# Patient Record
Sex: Male | Born: 1945 | Race: White | Hispanic: No | Marital: Married | Smoking: Former smoker
Health system: Southern US, Community
[De-identification: ages and names within clinical notes are randomized; demographics above are authoritative.]

## PROBLEM LIST (undated history)

## (undated) DIAGNOSIS — N183 Chronic kidney disease, stage 3 unspecified: Secondary | ICD-10-CM

## (undated) DIAGNOSIS — G4730 Sleep apnea, unspecified: Secondary | ICD-10-CM

## (undated) DIAGNOSIS — I2510 Atherosclerotic heart disease of native coronary artery without angina pectoris: Secondary | ICD-10-CM

## (undated) DIAGNOSIS — R51 Headache: Secondary | ICD-10-CM

## (undated) DIAGNOSIS — K209 Esophagitis, unspecified without bleeding: Secondary | ICD-10-CM

## (undated) DIAGNOSIS — K5792 Diverticulitis of intestine, part unspecified, without perforation or abscess without bleeding: Secondary | ICD-10-CM

## (undated) DIAGNOSIS — K449 Diaphragmatic hernia without obstruction or gangrene: Secondary | ICD-10-CM

## (undated) DIAGNOSIS — K2960 Other gastritis without bleeding: Secondary | ICD-10-CM

## (undated) DIAGNOSIS — M1990 Unspecified osteoarthritis, unspecified site: Secondary | ICD-10-CM

## (undated) DIAGNOSIS — N200 Calculus of kidney: Secondary | ICD-10-CM

## (undated) DIAGNOSIS — G8929 Other chronic pain: Secondary | ICD-10-CM

## (undated) DIAGNOSIS — K2950 Unspecified chronic gastritis without bleeding: Secondary | ICD-10-CM

## (undated) DIAGNOSIS — I7090 Unspecified atherosclerosis: Secondary | ICD-10-CM

## (undated) DIAGNOSIS — K4090 Unilateral inguinal hernia, without obstruction or gangrene, not specified as recurrent: Secondary | ICD-10-CM

## (undated) DIAGNOSIS — I10 Essential (primary) hypertension: Secondary | ICD-10-CM

## (undated) DIAGNOSIS — F419 Anxiety disorder, unspecified: Secondary | ICD-10-CM

## (undated) DIAGNOSIS — F319 Bipolar disorder, unspecified: Secondary | ICD-10-CM

## (undated) HISTORY — PX: APPENDECTOMY: SHX54

## (undated) HISTORY — DX: Unspecified osteoarthritis, unspecified site: M19.90

## (undated) HISTORY — DX: Anxiety disorder, unspecified: F41.9

## (undated) HISTORY — DX: Other chronic pain: G89.29

## (undated) HISTORY — DX: Headache: R51

## (undated) HISTORY — PX: PARTIAL COLECTOMY: SHX5273

---

## 1999-10-12 ENCOUNTER — Encounter: Payer: Self-pay | Admitting: Family Medicine

## 1999-10-12 ENCOUNTER — Encounter: Admission: RE | Admit: 1999-10-12 | Discharge: 1999-10-12 | Payer: Self-pay | Admitting: Family Medicine

## 2000-04-17 ENCOUNTER — Encounter: Payer: Self-pay | Admitting: Emergency Medicine

## 2000-04-17 ENCOUNTER — Emergency Department (HOSPITAL_COMMUNITY): Admission: EM | Admit: 2000-04-17 | Discharge: 2000-04-17 | Payer: Self-pay | Admitting: Emergency Medicine

## 2000-04-19 ENCOUNTER — Ambulatory Visit (HOSPITAL_COMMUNITY): Admission: EM | Admit: 2000-04-19 | Discharge: 2000-04-19 | Payer: Self-pay | Admitting: Internal Medicine

## 2000-04-19 ENCOUNTER — Encounter: Payer: Self-pay | Admitting: Urology

## 2001-04-16 ENCOUNTER — Encounter (INDEPENDENT_AMBULATORY_CARE_PROVIDER_SITE_OTHER): Payer: Self-pay

## 2001-04-17 ENCOUNTER — Encounter: Payer: Self-pay | Admitting: Emergency Medicine

## 2001-04-17 ENCOUNTER — Inpatient Hospital Stay (HOSPITAL_COMMUNITY): Admission: EM | Admit: 2001-04-17 | Discharge: 2001-05-04 | Payer: Self-pay | Admitting: Emergency Medicine

## 2001-04-20 ENCOUNTER — Encounter: Payer: Self-pay | Admitting: General Surgery

## 2001-07-20 ENCOUNTER — Encounter: Payer: Self-pay | Admitting: General Surgery

## 2001-07-20 ENCOUNTER — Ambulatory Visit (HOSPITAL_COMMUNITY): Admission: RE | Admit: 2001-07-20 | Discharge: 2001-07-20 | Payer: Self-pay | Admitting: General Surgery

## 2001-09-22 ENCOUNTER — Inpatient Hospital Stay (HOSPITAL_COMMUNITY): Admission: RE | Admit: 2001-09-22 | Discharge: 2001-09-28 | Payer: Self-pay | Admitting: General Surgery

## 2001-09-22 ENCOUNTER — Encounter (INDEPENDENT_AMBULATORY_CARE_PROVIDER_SITE_OTHER): Payer: Self-pay

## 2001-09-23 ENCOUNTER — Encounter: Payer: Self-pay | Admitting: General Surgery

## 2002-05-12 ENCOUNTER — Ambulatory Visit (HOSPITAL_COMMUNITY): Admission: RE | Admit: 2002-05-12 | Discharge: 2002-05-12 | Payer: Self-pay | Admitting: Gastroenterology

## 2003-08-10 ENCOUNTER — Encounter: Admission: RE | Admit: 2003-08-10 | Discharge: 2003-08-10 | Payer: Self-pay | Admitting: Gastroenterology

## 2003-08-10 ENCOUNTER — Encounter: Payer: Self-pay | Admitting: Gastroenterology

## 2005-02-06 ENCOUNTER — Encounter: Admission: RE | Admit: 2005-02-06 | Discharge: 2005-02-06 | Payer: Self-pay | Admitting: Nephrology

## 2006-11-10 ENCOUNTER — Ambulatory Visit: Payer: Self-pay | Admitting: Internal Medicine

## 2006-12-03 ENCOUNTER — Ambulatory Visit (HOSPITAL_BASED_OUTPATIENT_CLINIC_OR_DEPARTMENT_OTHER): Admission: RE | Admit: 2006-12-03 | Discharge: 2006-12-03 | Payer: Self-pay | Admitting: Internal Medicine

## 2006-12-18 ENCOUNTER — Ambulatory Visit: Payer: Self-pay | Admitting: Internal Medicine

## 2007-01-22 ENCOUNTER — Ambulatory Visit: Payer: Self-pay | Admitting: Internal Medicine

## 2007-04-07 ENCOUNTER — Ambulatory Visit: Payer: Self-pay | Admitting: Internal Medicine

## 2007-11-21 ENCOUNTER — Encounter: Payer: Self-pay | Admitting: Internal Medicine

## 2007-11-21 DIAGNOSIS — E785 Hyperlipidemia, unspecified: Secondary | ICD-10-CM | POA: Insufficient documentation

## 2007-11-21 DIAGNOSIS — I110 Hypertensive heart disease with heart failure: Secondary | ICD-10-CM

## 2007-11-21 DIAGNOSIS — G4733 Obstructive sleep apnea (adult) (pediatric): Secondary | ICD-10-CM

## 2007-11-24 ENCOUNTER — Encounter: Payer: Self-pay | Admitting: Internal Medicine

## 2009-04-21 ENCOUNTER — Encounter: Payer: Self-pay | Admitting: Cardiovascular Disease

## 2009-04-21 ENCOUNTER — Ambulatory Visit: Payer: Self-pay

## 2009-07-31 ENCOUNTER — Encounter: Admission: RE | Admit: 2009-07-31 | Discharge: 2009-07-31 | Payer: Self-pay | Admitting: Family Medicine

## 2009-09-12 ENCOUNTER — Encounter: Admission: RE | Admit: 2009-09-12 | Discharge: 2009-09-12 | Payer: Self-pay | Admitting: Orthopedic Surgery

## 2010-11-11 ENCOUNTER — Encounter: Payer: Self-pay | Admitting: Family Medicine

## 2010-11-11 ENCOUNTER — Encounter: Payer: Self-pay | Admitting: Nephrology

## 2011-03-05 NOTE — Assessment & Plan Note (Signed)
[facility]                             PULMONARY OFFICE NOTE   URBAN, NAVAL                        MRN:          [id number]  DATE:04/07/2007                            DOB:          [birth year]    PROBLEM:  Obstructive sleep apnea.   HISTORY:  Says that if he can keep CPAP on, he feels much better the  next day.  His snoring had driven him and wife into separate bedrooms so  she is not available to report on any breakthrough snoring.  His biggest  problem is that he takes it off in his sleep.  He does not remember  doing so.  He has been using Ambien and we discussed fugue states with  Ambien.   MEDICATIONS:  1. Ambien 10 mg.  2. Lamictal 200 mg.  3. Lotrel 5/10.  4. Advicor 500/20.  5. Aspirin 81 mg.  6. CPAP at 12 cwp.   OBJECTIVE:  VITAL SIGNS:  Weight 178 pounds, blood pressure 124/82,  pulse 81, room air saturation 98%.  HEENT:  There are no pressure marks on his face from the mask.  No nasal  congestion.  Breathing is unlabored.  Speech is clear.   IMPRESSION:  1. Obstructive sleep apnea.  2. His movements may be semi-automatic, complex enough in sleep to      take his CPAP mask off but not fitting the category of REM behavior      disorder or limb jerks.   PLAN:  1. We discussed sleep hygiene again.  2. He will continue CPAP at 12 cwp.  3. Try changing Ambien to available samples of Lunesta 3 mg one      nightly.  We are looking to see if we can relax him enough that he      is not fussing and pulling his mask off during the night.  4. Schedule return in four months, earlier p.r.n.     Clinton D. Maple Hudson, MD, Tonny Bollman, FACP  Electronically Signed    CDY/MedQ  DD: 04/07/2007  DT: 04/08/2007  Job #: [id number]   cc:   Talmadge Coventry, M.D.

## 2011-03-08 NOTE — Procedures (Signed)
[facility]  Patient:    Jordan [name], Jordan [name] Visit Number: [id number] MRN: [id number]          Service Type: END Location: ENDO Attending Physician:  Charna Elizabeth Dictated by:   Anselmo Rod, M.D. Proc. Date: 05/12/02 Admit Date:  05/12/2002 Discharge Date: 05/12/2002   CC:         Adolph Pollack, M.D.   Procedure Report  COLONOSCOPY REPORT  DATE OF BIRTH:  03-Apr-1946  PROCEDURE PERFORMED:  Screening colonoscopy.  ENDOSCOPIST:  Anselmo Rod, M.D.  INSTRUMENT USED:  Olympus videocolonoscope.  INDICATION FOR PROCEDURE:  A 65 year old white male undergoing a screening colonoscopy.  The patient had a recent change in bowel habits.  The patient has a previous history of acute sigmoid diverticulitis for which a sigmoid colectomy was done in June of 2002 by Dr. Abbey Chatters to rule out colonic polyps, masses, diverticulosis, etc.  PREPROCEDURAL PREPARATION:  Informed consent was procured from the patient. The patient fasted for eight hours prior to the procedure and prepped with a bottle of magnesium citrate and a gallon of Nu-Lytely the night prior to the procedure.  PREPROCEDURAL PHYSICAL:  VITAL SIGNS:  Stable.  NECK:  Supple.  CHEST:  Clear to auscultation, S1 and S2 regular.  ABDOMEN:  Soft with a well-healed surgical scar in the midline below the umbilicus and in the left lower quadrant from previous colostomy.  There is no evidence of hepatosplenomegaly.  No masses palpable.  DESCRIPTION OF PROCEDURE:  The patient was placed in the left lateral decubitus position and sedated with 66 mg of Demerol and 6 mg of Versed intravenously.  Once the patient was adequately sedated and maintained on low flow oxygen and continuous cardiac monitoring, the Olympus videocolonoscope was advanced from the rectum to the cecum without difficulty.  A healthy anastomosis was seen at 18 cm.  The entire colonic mucosa up to the  cecum appeared healthy with a normal vascular pattern, appendiceal orifice, and the ileocecal valve were clearly visualized.  Small internal and external hemorrhoids were seen in retroflexion and anal inspection respectively.  No diverticulosis, masses, or polyps were noted.  IMPRESSION: 1. Healthy-appearing colon with healthy anastomosis at 18 cm. 2. Small, nonbleeding internal and external hemorrhoids. 3. No diverticulosis, masses, or polyps.  RECOMMENDATIONS: 1. A high fiber diet has been recommended with liberal fluid intake. 2. Repeat colorectal cancer screening in the next 10 years unless the patient    develops any abnormal symptoms. 3. Outpatient followup in the next two weeks. Dictated by:   Anselmo Rod, M.D. Attending Physician:  Charna Elizabeth DD:  05/12/02 TD:  05/15/02 Job: [id number]

## 2011-03-08 NOTE — Discharge Summary (Signed)
[facility]  Patient:    Jordan [name], Jordan [name] Visit Number: [id number] MRN: [id number]          Service Type: Attending:  Adolph Pollack, M.D. Dictated by:   Adolph Pollack, M.D. Adm. Date:  09/22/01 Disc. Date: 09/28/01   CC:         Talmadge Coventry, M.D.   Discharge Summary  DISCHARGE DIAGNOSES: 1. End-colostomy. 2. Migraine headaches. 3. Depression.  PROCEDURES:  Laparoscopic-assisted colostomy closure and flexible sigmoidoscopy.  REASON FOR ADMISSION:  This 65 year old male underwent a sigmoid colectomy with colostomy for complicated diverticulitis with perforation and abscess. He has subsequently has recovered and was admitted for colostomy closure.  HOSPITAL COURSE:  He underwent the above procedure without complications. Postoperatively, he began having some headaches and ileus.  He had gaseous distention of his stomach which improved with some belching.  He continued to feel bloated; however, his bowel function began to return and he was started on a liquid diet.  By the sixth postoperative day, he was passing lots of gas, feeling much better.  Wounds were clean and intact and he was discharged.  DISPOSITION:  Discharge to home on a regular diet in satisfactory condition.  DISCHARGE MEDICATIONS:  He was given Tylox for pain and told to continue his home medications.  DISCHARGE INSTRUCTIONS:  He was given activity restrictions and will see me in the office in 10 days. Dictated by:   Adolph Pollack, M.D. Attending:  Adolph Pollack, M.D. DD:  10/08/01 TD:  10/10/01 Job: [id number]

## 2011-03-08 NOTE — Assessment & Plan Note (Signed)
[facility]                             PULMONARY OFFICE NOTE   LYNNE, RIGHI                        MRN:          [id number]  DATE:01/22/2007                            DOB:          07/06/1946    PROBLEM:  Obstructive sleep apnea.   HISTORY:  He says that he has been using CPAP most of the last two  nights set at 12 CWP. He complains that his mask leaks because of his  beard and often he has nasal congestion. We spent a considerable time  today discussing CPAP therapy and alternatives, mask comfort, and how to  work with his home care company.   MEDICATIONS:  1. Wellbutrin 300 mg.  2. Ambien 10 mg.  3. Lamictal 200 mg.  4. Lotrel 5/10.  5. Advicor 500/20.  6. Aspirin 81 mg.  7. Glucosamine 2000 mg.  8. CPAP at 12 CWP.   ALLERGIES:  No medication allergy.   OBJECTIVE:  Weight 178 pounds, blood pressure 138/74, pulse regular 75,  room air saturation 99%. There is a pink pressure mark across the bridge  of his nose. There is little or no nasal mucosa edema. He seems alert.  Breathing is unlabored.   IMPRESSION:  Obstructive sleep apnea very marginal so far on CPAP at 12  CWP.   PLAN:  1. Sample Veramyst 2 sprays each nostril daily.  2. He will work on mask fit with [facility].  3. Schedule return one month, earlier p.r.n.     Clinton D. Maple Hudson, MD, Tonny Bollman, FACP  Electronically Signed    CDY/MedQ  DD: 01/25/2007  DT: 01/26/2007  Job #: [id number]   cc:   Talmadge Coventry, M.D.

## 2011-03-08 NOTE — H&P (Signed)
[facility]  Patient:    MECCA, BARGA                        MRN: [id number] Adm. Date:  [id number] Disc. Date: [id number] Attending:  Shelba Flake                         History and Physical  EMERGENCY ROOM NOTE  This 65 year old white male was seen two days ago with left flank pain and he had an upper ureteral stone and a very small obstructing right ureteral stone. He started to feel much better when I saw him yesterday in consultation, but today he has had severe unrelenting left flank pain.  He presented to the emergency room and was given a dose of Toradol 60 mg I.M.  I discussed with him the options of continued observation versus intervention.  I got a KUB and now appears as if his stone is in the distal left ureter.  He is in general good health.  MEDICATIONS:  He is on no regular medications.  He is taking analgesics for his renal colic.  ALLERGIES:  He denied.  SOCIAL HISTORY:  He does not smoke or drink alcohol.  PHYSICAL EXAMINATION:  GENERAL:  He is a white male appearing stated age in obvious distress.  ABDOMEN:  Soft and nontender.  EXTERNAL GENITALIA:  Unremarkable.  PLAN:  The patient wants to go ahead with cystoscopic and ureteroscopic intervention of the stone and we will do so this afternoon. DD:  04/19/00 TD:  04/19/00 Job: [id number]

## 2011-03-08 NOTE — Assessment & Plan Note (Signed)
[facility]                             PULMONARY OFFICE NOTE   BRYTON, ROMAGNOLI                        MRN:          [id number]  DATE:11/10/2006                            DOB:          03/06/1946    PROBLEMS:  A 65 year old man referred in sleep medicine consultation at  the kind request of Dr. Smith Mince for obstructive sleep apnea.   HISTORY:  He says he was diagnosed with a sleep study at [facility] at least 20 years ago and told then he had sleep apnea.  I may  well have seen him, but he does not remember, and I do not.  There were  no records.  He was told then that he needed CPAP, but he never followed  up until as he says, My wife ran me out of the bedroom because of his  snoring.  Usual bedtime is between 11 and 12 p.m., estimating one-half  to one hour for sleep onset and waking at least 3-4 times during the  night, frequently for nocturia.  He is finally up in the morning at 7:00  a.m., feeling as tired as he went to bed.  He often comes home and falls  asleep after work.  He drives little.  He is aware that he tosses and  turns a lot in his sleep but is not aware of anymore complex behaviors,  kicking or jerking.   MEDICATIONS:  1. Wellbutrin 300 mg.  2. Ambien 10 mg.  3. Lamictal 200 mg.  4. Lotrel 5/10.  5. Advicor 500/20.  6. Aspirin 81 mg.  7. Glucosamine 2000 mg.   MEDICATION ALLERGIES:  None.   REVIEW OF SYSTEMS:  Frequent nasal congestion and mouth-breathing.  Weight has gone up a few pounds in the last year.  Occasional  indigestion, depression paroxysm, prostatism.   PAST HISTORY:  Hypertension.  Elevated cholesterol.  Chronic headaches.  Sleep apnea.  Colon surgery in 2002 for diverticulitis.  He denies any  history of ENT surgery, heart disease, or lung disease.  No problems  with thyroid are known.  He is treated for migraine and for tension  headache.   OBJECTIVE:  VITAL SIGNS:  Weight 180 pounds.  BP  122/82, pulse regular  at 77, room air saturation 98%.  GENERAL:  Medium body build with mild abdominal obesity, male pattern.  Rather restless-appearing, shifting of hands and feet.  He comes across  as tired but cooperative.  HEENT/NECK:  Palate length 3-4/4, rather posteriorly placed, thin soft  palate.  Mandible is normal.  There is no thyromegaly or stridor. Voice  quality is normal.  He is breathing comfortably through his nose without  obstruction.  LUNGS:  Clear.  HEART:  Heart sounds regular without murmur or gallop.   IMPRESSION:  Obstructive sleep apnea, needing update on status and  consideration of available therapies.   PLAN:  I have discussed sleep apnea associated medical concerns and  available treatments.  We are scheduling a split protocol nocturnal  polysomnography at the [facility] system center, and he will  return  after that for completion.  I have emphasized his responsibility to be  alert while driving or get somebody else to drive.     Clinton D. Maple Hudson, MD, Tonny Bollman, FACP  Electronically Signed    CDY/MedQ  DD: 11/10/2006  DT: 11/10/2006  Job #: [id number]   cc:   Talmadge Coventry, M.D.  [facility]

## 2011-03-08 NOTE — Discharge Summary (Signed)
[facility]  Patient:    Jordan [name], Jordan [name]                        MRN: [id number] Adm. Date:  [id number] Disc. Date: 05/04/01 Attending:  Arlis Porta CC:         Talmadge Coventry, M.D.   Discharge Summary  PRINCIPAL DISCHARGE DIAGNOSIS:  Sigmoid diverticulitis with perforation abscess.  SECONDARY DIAGNOSIS:  1. Postoperative bilious. 2. Hypertension. 3. Migraine headaches. 4. Mild depression. 5. Hypokalemia. 6. Malnutrition. 7. Thrombocytosis.  PROCEDURE:  Exploratory laparotomy, sigmoid colectomy, colostomy April 20, 2001.  REASON FOR ADMISSION:  This 65 year old male presented with back pain that radiated to the left lower quadrant and persisted.  It was associated with loose stool and fever.  He was seen in the emergency department and noticed to have a fever and mild tachycardia and left lower quadrant tenderness.  A CT scan demonstrated a left lower quadrant suprapubic inflammatory process.  His white blood cell count was 18,200 and he was admitted.  The patient was started on intravenous antibiotics and with serial exams.  He had a repeat CT scan showing more of a abscess type picture, but leakage of contrast into the abscess cavity.  On April 20, 2001, he was taken to the operating room for the above procedure.  He also has a central line inserted at that time.  Postoperatively he had a slow course.  He did have a prolonged illness.  He was placed on Unasyn and Flagyl.  He had hypokalemia and this was corrected.  Eventually he developed some hypertension and Dr. Smith Mince was called to see him.  She placed him on a Catapres patch and p.r.n. labetalol. He also had some migraine headaches which were treated with intermittent Toradol and Demerol.  His fluid cultures came back positive for E. coli which is not covered by Unasyn and his antibiotics were switched over to Zosyn which offered adequate coverage of the E. coli and he  remained on these for seven days.  He was somewhat depressed with his situation.  His wound was left open partially and there were packs placed in.  He slowly improved.  He was placed on total parenteral nutrition for malnutrition which slowly improved as his appetite improved.  His stoma began working and his wounds were healing well. By May 04, 2001, which is the 14 postoperative day, he was feeling much better, eating more, stoma was working well.  His TNA was stopped and he was felt to be satisfactory for discharge.  He was noted to have a thrombocytosis and was started on enteric-coated aspirin.  His discharge platelet count is 954,000.  DISPOSITION:  Discharged to home in satisfactory condition May 04, 2001.  He will be discharged on an enteric-coated aspirin one a day, Catapres-TTS two patch one every seven days, and darvocet for pain.  He was given activity and dietary instructions.  I have recommended that he see Dr. Smith Mince in one week to check his hypertension and recommend that he see me in two weeks for wound check.  Home health nursing has been arranged, this system with his wound care and his colostomy care. DD:  05/04/01 TD:  05/04/01 Job: [id number]

## 2011-03-08 NOTE — Op Note (Signed)
[facility]  Patient:    MALIKAH, LAKEY                        MRN: [id number] Proc. Date: 04/20/01 Adm. Date:  [id number] Attending:  Arlis Porta                           Operative Report  PREOPERATIVE DIAGNOSIS:  Sigmoid diverticulitis with abscess.  POSTOPERATIVE DIAGNOSIS:  Sigmoid diverticulitis with abscess.  PROCEDURE PERFORMED:  Sigmoid colectomy with colostomy and central line insertion.  SURGEON:  Adolph Pollack, M.D.  ASSISTANT:  Zigmund Daniel, M.D.  ANESTHESIA:  General.  INDICATIONS:  This 65 year old male was admitted 04/17/01 with sigmoid diverticulitis.  He has failed to improve medically, and underwent a CT scan today, which demonstrated an ill-defined abscess cavity with leakage of contrast into this.  Because he has complicated sigmoid diverticulitis not responding to medical management, he is brought to the operating room.  He also has poor peripheral venous access and thus the reason for central line placement.  DESCRIPTION OF PROCEDURE:  He was placed supine on the operating room and general anesthesia was administered.  His abdomen was sterilely prepped and draped.  A lower midline incision was made incising the skin up to the umbilicus sharply and then dividing the subcutaneous tissue and midline fascia with the cautery.  The peritoneal cavity was entered under direct vision and the peritoneum incised.  The bladder was retracted inferiorly.  Once in the peritoneal cavity, I noted the diseased segment of mid to proximal sigmoid colon.  The distal sigmoid colon was fairly normal appearing.  I was able to divide the sigmoid colon at its distal portion with the endo GIA stapler.  I then stayed close to the colon and divided the mesenteric vessels between clamps and ligated them.  I did not see the ureter, but believe it is well posterior to this plane.  The ureter remained clear throughout the case.  I noted  the abscess cavity laterally and drained this.  Once I was proximal to the disease, I divided the descending colon and sigmoid colon junction, and handed this specimen off the field.  I mobilized the ascending colon to allow for placement of the colostomy on the skin.  Once this was done, I went ahead and irrigated out the abdominal cavity and the pelvis area copiously.  I subsequently made a circular incision in the left mid-abdomen and there was full thickness.  I brought up the distal colon stump through this incision and anchored it to the anterior rectus sheath with interrupted Vicryl sutures.  There was one posterior anchoring suture as well. I then changed gloves.  After sponge and instrument counts were reported to be correct, I replaced the abdominal viscera and closed the fascia with running #1 PDS suture mixed with interrupted #1 Vicryl sutures.  The subcutaneous tissue was irrigated.  The skin was partly closed with staples and the rest was packed with moistened gauze.  I then covered the incision with a towel and matured the colostomy with interrupted 3-0 Vicryl sutures.  A stoma appliance was then placed.  A dry dressing was then placed over the incision followed by tape.  I next removed the gown and put on sterile gloves.  The right neck area was sterilely prepped and draped.  A size 22 needle was used to identify the right jugular vein, and  then a size 6.2 needle was placed into the right jugular vein.  A wire was passed through the needle.  Using the Seldinger technique, a 30 cm triple lumen catheter was inserted into the right jugular vein.  It was anchored to skin with interrupted silk sutures.  A sterile dressing was then applied.  I was able to withdraw blood and flush easily.  A portable chest x-ray is pending at this time.  He tolerated both procedures well with any apparent complications.  He was subsequently taken to the recovery room in satisfactory condition.   Of note was that I did anchor the distal rectosigmoid stump close to the site of the colostomy. DD:  04/20/01 TD:  04/21/01 Job: [id number]

## 2011-03-08 NOTE — Assessment & Plan Note (Signed)
[facility]                             PULMONARY OFFICE NOTE   HUTCH, RHETT                        MRN:          [id number]  DATE:12/18/2006                            DOB:          1945/11/04    PROBLEM:  Obstructive sleep apnea.   HISTORY:  Mr. Rajewski returns after sleep study on December 03, 2006 at  the [facility] which confirms severe obstructive apnea with an  index of 54.4 per hour, moderate snoring and oxygen desaturation to 69%.  CPAP titration was successful at 12 CWP for an index of 3.2 per hour.  Results were reviewed with him today.  He mentions he has a mild head  cold with nasal stuffiness which is apparent in conversation with him  but he says he is not usually like that.   MEDICATIONS:  Wellbutrin 300 mg, Ambien 10 mg, Lamictal 200 mg, Lotrel  5/10, Advicor 500/20, aspirin 81 mg, glucosamine.   ALLERGIES:  NO MEDICATION ALLERGY.   OBJECTIVE:  Weight 178 pounds, BP 122/78, pulse regular 81, room air  saturation 98%.  He is a little bit overweight, male pattern.  He looks  somewhat tired and is a little congested in the nose but not frankly  obstructed.   IMPRESSION:  Obstructive sleep apnea that should respond nicely to  continuous positive airway pressure.  Incidental viral upper respiratory  infection.   PLAN:  1. Home trial of CPAP at 12 CWP.  2. General treatment direction for sleep apnea was reviewed including      options and an emphasis on weight loss with his responsibility to      drive safely.  3. Schedule return 1 month, earlier p.r.n.     Clinton D. Maple Hudson, MD, Tonny Bollman, FACP  Electronically Signed    CDY/MedQ  DD: 12/20/2006  DT: 12/20/2006  Job #: [id number]   cc:   Talmadge Coventry, M.D.

## 2011-03-08 NOTE — Consult Note (Signed)
[facility]  Patient:    Jordan [name], Jordan [name]                        MRN: [id number] Proc. Date: 04/17/01 Adm. Date:  [id number] Attending:  Gwenlyn Saran Vonita Moss, M.D.   Consultation Report  CHIEF COMPLAINT:              Left lower quadrant pain.  HISTORY OF PRESENT ILLNESS:   [name] is a 65 year old male who noted some bilateral muscular-type back pain 48 hours ago. It started to get worse on the left side and also, it radiated around to his left lower quadrant and the suprapubic area was more severe, associated with some loose stool and fever. Pain was worse when he moved. He had decrease in appetite. No nausea or vomiting. He presented to the emergency department for evaluation. He has never had anything like this before.  PAST MEDICAL HISTORY: 1. Nephrolithiasis. 2. Migraine headaches. 3. Appendectomy.  MEDICATIONS:  Parafon Forte, Vicodin, Ambien.  SOCIAL HISTORY:  No tobacco or alcohol use.  REVIEW OF SYSTEMS:  Cardiovascular: No known heart disease or hypertension. Pulmonary: No asthma, pneumonia, tuberculosis. GI: No peptic ulcer disease, previous diverticulitis. No hepatitis or jaundice. He stated he had a colonoscopy 20 years ago. GU: No hematuria or dysuria. He states that when he does try to strain to urinate, he has more pain in that area. Endocrine: No diabetes or thyroid disease. Neurologic: No strokes or seizures. Hematologic: No blood disorders, transfusions, or deep venous thrombosis.  PHYSICAL EXAMINATION:  GENERAL:  An uncomfortable appearing male lying still on a stretcher.  VITAL SIGNS:  Temperature of 100.4, pulse 103, blood pressure 157/90, respiratory rate 22.  SKIN:  Warm and dry, no jaundice.  HEENT:  Eyes: Extraocular motions intact. No sclera, icterus present.  NECK:  No palpable masses.  NODES:  No palpable cervical, supraclavicular, axillary adenopathy.  CARDIOVASCULAR:  Heart  demonstrates increased rate with a regular rhythm and no murmur.  RESPIRATORY:  Breath sounds equal and clear. Respirations unlabored.  ABDOMEN:  Soft. There is a left lower quadrant tenderness and guarding to palpation and percussion. There is some suprapubic tenderness present as well.  RECTAL:  Normal tone without external lesions. His prostate is smooth without nodules. Mucousy stool is guaiac negative.  MUSCULOSKELETAL:  He has good range of motion of his extremities. No cyanosis or edema.  NEUROLOGIC:  He is alert and oriented and responds appropriately. He has a normal motor strength.  LABORATORY DATA:  Remarkable for normal amylase of 62. White blood cell count 18,200 with a left shift. Hemoglobin 13.8. Complete metabolic panel is all normal.  EKG demonstrates normal sinus rhythm.  CT scan review demonstrates left lower quadrant and suprapubic inflammatory process running the sigmoid colon. No free air noted. No free fluid noted.  IMPRESSION:  Acute sigmoid diverticulitis--no evidence of abscess or free perforation at this time.  PLAN:  Admission with bowel rest and IV antibiotics. I would plan to try to treat this with medical therapy. I explained to him that if he did not improve, that he may need an urgent operation which involved a sigmoid colectomy and colostomy. I also explained to him that if he improved over the next three or four days but then the situation worsened, we would repeat a CT scan, and if there was an abscess that eventually  formed, we would try percutaneous drainage. If he does improve medically, I suggested to him that he would need a colonoscopy approximately two months after this event. DD:  04/17/01 TD:  04/17/01 Job: [id number]

## 2011-03-08 NOTE — Procedures (Signed)
[facility]  Patient:    Jordan [name], Jordan [name]                        MRN: [id number] Proc. Date: 04/19/00 Adm. Date:  [id number] Disc. Date: [id number] Attending:  Shelba Flake                           Procedure Report  PREOPERATIVE DIAGNOSIS:  Left distal ureteral stone with severe pain.  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  PROCEDURE:  Cystoscopy, left ureteroscopic stone extraction with holmium laser, left retrograde pyelogram with interpretation and insertion of left double-J catheter.  ANESTHESIA:  General.  SURGEON:  Maretta Bees. Vonita Moss, M.D.  INDICATIONS:  This 65 year old gentleman has had a past history of stones and a couple of days ago, developed left flank pain and was found to have bilateral upper ureteral stones, with the left side obstructing and the right side not obstructing.  We were planning to follow him to see if he could pass the stone spontaneously and he developed severe left flank pain today and called me and came to the emergency room for evaluation.  KUB was done and it looks like the upper ureteral stone on the left is now down in the pelvic ureter and measures about 4 mm in size and the right upper ureteral stone, which is very tiny, is still present.  We discussed different options.  The patient wanted to go ahead with intervention at this time.  DESCRIPTION OF PROCEDURE:  The patient was brought to the operating room and placed in lithotomy position.  External genitalia were prepped and draped in usual fashion.  He was cystoscoped.  Anterior urethra was normal except for mild meatal stenosis.  Prostate was partially obstructing.  The bladder was unremarkable.  I passed a guidewire up the left ureter and under fluoroscopic control, went beyond the calcification felt to be the stone.  I then did a balloon dilation of the intramural ureter with the  4-cm 15-French balloon.  I dilated him for three minutes.  I then inserted the 6.4-French short rigid ureteroscope and just beyond the intramural ureter, I found another tight area and could not negotiate beyond this region.  I then reinserted the balloon dilator and dilated this area and after that, I was able to negotiate this tight spot.  I then visualized this brownish-golden irregular stone.  I thought it was too big to manipulate out without fragmentation, so I inserted the holmium laser probe and fragmented the stone into three pieces.  I then used the [facility] stone basket and removed the three pieces of fragmented stone and were given later to the patient.  I then injected contrast and there was a dilated upper ureter and dilated pyelocalyceal system.  With the guidewire in place, I re-backloaded the cystoscope and inserted a 6-French 26-cm double-J catheter with a full coil in the renal pelvis and full coil in the bladder.  I left the string off because I want to leave this catheter in for several days to allow healing of the distal ureter and possible strictured area.  He was then taken to the recovery room in good condition, having tolerated the procedure well.DD:  04/19/00 TD:  04/20/00 Job: [id number]

## 2011-03-08 NOTE — Op Note (Signed)
[facility]  Patient:    Jordan [name], Jordan [name] Visit Number: [id number] MRN: [id number]          Service Type: SUR Location: 3W 4782 01 Attending Physician:  Arlis Porta Dictated by:   Adolph Pollack, M.D. Proc. Date: 09/22/01 Admit Date:  09/22/2001                             Operative Report  PREOPERATIVE DIAGNOSIS:  Colostomy.  POSTOPERATIVE DIAGNOSIS:  Colostomy.  OPERATION PERFORMED:  Laparoscopically assisted colostomy closure.  Flexible sigmoidoscopy.  SURGEON:  Adolph Pollack, M.D.  ASSISTANT:  Lorne Skeens. Hoxworth, M.D.  ANESTHESIA:  General.  INDICATIONS FOR PROCEDURE:  This is a 65 year old male with complicated sigmoid diverticulitis with perforation abscess who underwent a sigmoid colectomy with colostomy.  He has recovered from that and now is admitted and now presents for elective colostomy closure.  DESCRIPTION OF PROCEDURE:  He was placed supine on the operating table and a general anesthetic was administered.  The abdominal wall was shaved.  I sterilely prepped to the stoma site and then inserted a Betadine soaked gauze sponge, covered this with a Tegaderm.  The abdomen was then sterilely prepped and draped.  Of note was that he was placed in the gentle lithotomy position. I made an incision in the right midabdominal wall incised the skin and subcutaneous tissues sharply.  I identified the most superficial layer of fascia and incised it sharply.  I placed a pursestring of 0 Vicryl around the fascia edges.  Using blunt dissection, I did muscle spreading and entering the peritoneal cavity.  A Hasson trocar was introduced into the peritoneal cavity and a pneumoperitoneum created by insufflation of CO2 gas.  Next a laparoscope was introduced.  There were multiple adhesions of the omentum and intestine to the anterior abdominal wall.  Under direct vision, a 10 mm trocar was placed in the right lower quadrant and  using sharp dissection with no cautery, I incised filmy adhesions allowing part of the small intestine and omentum to drop back into the peritoneal cavity in the epigastric region.  Once I had cleared this off, I placed an incision in the epigastric region and inserted a 10 mm trocar there.  I continued to work using only sharp dissection an no cautery.  I was able to gently divide the multiple adhesions between the anterior abdominal wall and the intestine and drop all of these down until I exposed the colostomy.  I did some dissection around the colostomy mobilizing the descending colon.  I subsequently identified the rectal stump which I had sewn to the peritoneum, just distal to the colostomy.  At this point I then directed my attention to the colostomy itself.  I made an elliptical incision around the colostomy and using the cautery I completely mobilized it and freed it from its attachments to the anterior abdominal wall.  I got very good length of this.  I subsequently put long marking sutures on this and placed it back into the peritoneal cavity.  I then was able to identify the distal segment and bring that stump up and mobilize it well up into the wound.  I then grasped the proximal segment.  I excised the colostomy using the stapler and sent this off for pathology.  I then placed the proximal segment and the distal segment side-to-side and performed a side-to-side staple anastomosis.  I closed the remaining enterotomy with  the linear noncutting stapler.  The anastomosis was patent, viable and under no tension.  I dropped the anastomosis back into the peritoneal cavity.  Next, gloves were changed.  I closed the abdominal wall defect where the colostomy was in two layers with running #1 PDS suture.  I irrigated each layer with antibiotic solution and closed the skin with staples.  next, a flexible sigmoidoscope was introduced through the rectum and we gently passed it up until  we saw the anastomosis.  Simultaneously, I reinstituted the pneumoperitoneum and watched from the inside not seeing any leakage.  The anastomosis appeared viable internally by way of the flexible sigmoidoscopy. Subsequently evacuated the air from the colon.  I did not notice any bleeding. I released the pneumoperitoneum and removed all the trocars.  The right midabdominal wall fascial defect was closed by tightening up and tying down the pursestring suture.  The remaining skin incisions from the trocar sites were closed with staples and dressings were applied.  The patient tolerated the procedure well without any apparently complications and he was taken to the recovery room in satisfactory condition. Dictated by:   Adolph Pollack, M.D. Attending Physician:  Arlis Porta DD:  09/22/01 TD:  09/22/01 Job: [id number]

## 2011-03-08 NOTE — Procedures (Signed)
NAME:  Jordan [name], Jordan [name]                 ACCOUNT NO.:  [id number]   MEDICAL RECORD NO.:  [id number]          PATIENT TYPE:  OUT   LOCATION:  SLEEP CENTER                 [facility]ACILITY[facility]  [facility]   PHYSICIAN:  Clinton D. Maple Hudson, MD, FCCP, FACPDATE OF BIRTH:  04[birth year]Sep[birth year]1944   DATE OF STUDY:  12/03/2006                            NOCTURNAL POLYSOMNOGRAM   REFERRING PHYSICIAN:  Clinton D. Young, MD, FCCP, FACP   INDICATION FOR STUDY:  Hypersomnia with sleep apnea.   EPWORTH SLEEPINESS SCORE:  6/24. BMI 28.2, weight 180 pounds.   MEDICATIONS:  Home medications were listed and reviewed.   SLEEP ARCHITECTURE:  Total sleep time 298 minutes with sleep efficiency  71%. Stage I was 9%, stage II 37%, stages III and IV absent. REM 4% of  total sleep time. Sleep latency 21 minutes. REM latency 276 minutes.  Awake after sleep onset 99 minutes, arousal index 33, indicating  increased sleep fragmentation. Ambien was taken at bedtime.   RESPIRATORY DATA:  Split study protocol. Apnea/hypopnea index (AHI, RDI)  54.4 obstructive events per hour indicating severe obstructive sleep  apnea/hypopnea syndrome before CPAP. This included 74 obstructive  apnea's, 1 mixed apnea and 42 hypopnea's before CPAP. Events were not  positional. REM AHI 0 per hour. CPAP was titrated to 12 CWP, AHI 3.2 per  hour. A medium Mirage Quattro mask was used with heated humidifier.   OXYGEN DATA:  Moderate snoring with oxygen desaturation to a nadir of  69%. Mean oxygen saturation after CPAP control was 92% on room air.   CARDIAC DATA:  Normal sinus rhythm.   MOVEMENT-PARASOMNIA:  Frequent limb jerks but few of these were  associated with arousal or awakening.   IMPRESSIONS-RECOMMENDATION:  1. Severe obstructive sleep apnea/hypopnea syndrome, AHI 54.4 per hour      with non positional events. Moderate snoring and oxygen      desaturation to a nadir of 69%.  2. Successful CPAP titration to 12 CWP, AHI 3.2 per hour. A medium      Mirage  Quattro mask was used with heated humidifier.      Clinton D. Maple Hudson, MD, [facility], FACP  Diplomate, Biomedical engineer of Sleep Medicine  Electronically Signed     CDY/MEDQ  D:  12/07/2006 12:14:14  T:  12/07/2006 78:29:56  Job:  [id number]

## 2011-03-08 NOTE — H&P (Signed)
[facility]  Patient:    Jordan [name], NISHI Visit Number: [id number] MRN: [id number]          Service Type: SUR Location: 3W 4782 01 Attending Physician:  Arlis Porta Dictated by:   Adolph Pollack, M.D. Admit Date:  09/22/2001   CC:         Talmadge Coventry, M.D.   History and Physical  REASON FOR ADMISSION:  Colostomy closure.  HISTORY OF PRESENT ILLNESS:  Jordan [name] is a 65 year old male who underwent a sigmoid colectomy with colostomy April 20, 2001, for complicated diverticulitis with perforation and abscess. He spent in the hospital two weeks postoperatively and then discharged to home. He has convalesced and improved, gotten his strength back, although he has been somewhat depressed at times. He had a barium enema which demonstrated the distal, colonic, and recto segment to be unremarkable. There is scattered diverticula throughout the proximal colon. He is now admitted for elective laparoscopically assisted colostomy closure.  PAST MEDICAL HISTORY: 1. Migraine headaches. 2. Depression. 3. Diverticulitis.  PAST SURGICAL HISTORY: 1. Appendectomy. 2. Exploratory laparotomy with sigmoid colectomy and colostomy.  ALLERGIES:  None known.  CURRENT MEDICATIONS:  Parafon Forte p.r.n. headaches, Vicodin p.r.n., Wellbutrin SR 150 mg b.i.d., Ambien 10 mg 1/2 tab q.h.s., aspirin 325 mg q.d.  SOCIAL HISTORY:  Single. No tobacco or alcohol use. His support system is his son and his girlfriend.  REVIEW OF SYSTEMS:  CARDIOVASCULAR:  He had hypertension when he was in the hospital before. However, he has been off medication, and his blood pressure has been relatively normal. No heart disease. PULMONARY:  No chronic lung disease. NEUROLOGICAL:  He does have known migraine headaches. PSYCHIATRIC: He does have some depression and was quite anxious coming into the operation. GU:  He has had kidney stones in the past.  PHYSICAL  EXAMINATION:  GENERAL:  A well-developed, well-nourished male in no acute distress, pleasant and cooperative.  VITAL SIGNS:  Temperature is 97.6, blood pressure 138/84, pulse 70, respiratory rate 12.  SKIN:  Warm and dry without rash.  HEENT:  Eyes:  Extraocular motions intact. No scleral icterus.  NECK:  Supple without palpable masses.  CARDIOVASCULAR:  Heart demonstrates regular rate and rhythm without a murmur.  RESPIRATORY:  Breath sounds equal and clear. Respirations unlabored.  ABDOMEN:  Soft, nontender. There is a well-healed midline and a pink stoma in the left lower quadrant.  EXTREMITIES:  No cyanosis or edema.  IMPRESSION:  End sigmoid colostomy admitted for laparoscopically assisted colostomy closure.  PLAN:  We will proceed with a laparoscopically assisted colostomy closure. The procedure and the risks including but not limited to bleeding, infection, anastomotic leak, and injury to extra-abdominal and intra-abdominal organs, such as ureter, bladder, intestines were explained to him. We also discussed the risks of general anesthesia. He understands and agrees to proceed. Dictated by:   Adolph Pollack, M.D. Attending Physician:  Arlis Porta DD:  09/22/01 TD:  09/22/01 Job: [id number]

## 2011-10-22 HISTORY — PX: SPINE SURGERY: SHX786

## 2011-12-01 ENCOUNTER — Emergency Department (HOSPITAL_COMMUNITY): Payer: Medicare Other

## 2011-12-01 ENCOUNTER — Encounter (HOSPITAL_COMMUNITY): Payer: Self-pay | Admitting: Emergency Medicine

## 2011-12-01 ENCOUNTER — Emergency Department (HOSPITAL_COMMUNITY)
Admission: EM | Admit: 2011-12-01 | Discharge: 2011-12-01 | Disposition: A | Discharge status: Home / Self Care | Payer: Medicare Other | Attending: Emergency Medicine | Admitting: Emergency Medicine

## 2011-12-01 ENCOUNTER — Other Ambulatory Visit: Payer: Self-pay

## 2011-12-01 DIAGNOSIS — M549 Dorsalgia, unspecified: Secondary | ICD-10-CM | POA: Insufficient documentation

## 2011-12-01 DIAGNOSIS — M62838 Other muscle spasm: Secondary | ICD-10-CM

## 2011-12-01 DIAGNOSIS — Z87442 Personal history of urinary calculi: Secondary | ICD-10-CM | POA: Insufficient documentation

## 2011-12-01 DIAGNOSIS — R112 Nausea with vomiting, unspecified: Secondary | ICD-10-CM | POA: Insufficient documentation

## 2011-12-01 DIAGNOSIS — Z7982 Long term (current) use of aspirin: Secondary | ICD-10-CM | POA: Insufficient documentation

## 2011-12-01 DIAGNOSIS — Z79899 Other long term (current) drug therapy: Secondary | ICD-10-CM | POA: Insufficient documentation

## 2011-12-01 DIAGNOSIS — M5380 Other specified dorsopathies, site unspecified: Secondary | ICD-10-CM | POA: Insufficient documentation

## 2011-12-01 DIAGNOSIS — N289 Disorder of kidney and ureter, unspecified: Secondary | ICD-10-CM | POA: Insufficient documentation

## 2011-12-01 DIAGNOSIS — I10 Essential (primary) hypertension: Secondary | ICD-10-CM | POA: Insufficient documentation

## 2011-12-01 HISTORY — DX: Essential (primary) hypertension: I10

## 2011-12-01 HISTORY — DX: Calculus of kidney: N20.0

## 2011-12-01 LAB — URINALYSIS, ROUTINE W REFLEX MICROSCOPIC
Bilirubin Urine: NEGATIVE
Glucose, UA: NEGATIVE mg/dL
Hgb urine dipstick: NEGATIVE
Ketones, ur: NEGATIVE mg/dL
Leukocytes, UA: NEGATIVE
Protein, ur: NEGATIVE mg/dL
Urobilinogen, UA: 0.2 mg/dL (ref 0.0–1.0)
pH: 7 (ref 5.0–8.0)

## 2011-12-01 LAB — CBC
Hemoglobin: 15.3 g/dL (ref 13.0–17.0)
MCHC: 35 g/dL (ref 30.0–36.0)
MCV: 88.3 fL (ref 78.0–100.0)
Platelets: 338 10*3/uL (ref 150–400)
RBC: 4.95 MIL/uL (ref 4.22–5.81)
RDW: 12.7 % (ref 11.5–15.5)

## 2011-12-01 LAB — BASIC METABOLIC PANEL
CO2: 20 mEq/L (ref 19–32)
Calcium: 10.4 mg/dL (ref 8.4–10.5)
GFR calc Af Amer: 49 mL/min — ABNORMAL LOW (ref >90)
GFR calc non Af Amer: 43 mL/min — ABNORMAL LOW (ref >90)
Glucose, Bld: 125 mg/dL — ABNORMAL HIGH (ref 70–99)
Potassium: 3.7 mEq/L (ref 3.5–5.1)
Sodium: 140 mEq/L (ref 135–145)

## 2011-12-01 LAB — DIFFERENTIAL
Basophils Relative: 0 % (ref 0–1)
Eosinophils Absolute: 0 10*3/uL (ref 0.0–0.7)
Eosinophils Relative: 0 % (ref 0–5)
Lymphs Abs: 1.3 10*3/uL (ref 0.7–4.0)
Monocytes Relative: 7 % (ref 3–12)

## 2011-12-01 MED ORDER — DIAZEPAM 5 MG PO TABS
5.0000 mg | ORAL_TABLET | Freq: Once | ORAL | Status: AC
  Administered 2011-12-01: 5 mg via ORAL
  Filled 2011-12-01: qty 1

## 2011-12-01 MED ORDER — DIAZEPAM 5 MG PO TABS
5.0000 mg | ORAL_TABLET | Freq: Once | ORAL | Status: DC
  Filled 2011-12-01: qty 1

## 2011-12-01 MED ORDER — DIAZEPAM 5 MG PO TABS: ORAL_TABLET | ORAL | Status: AC

## 2011-12-01 MED ORDER — SODIUM CHLORIDE 0.9 % IV BOLUS (SEPSIS)
500.0000 mL | Freq: Once | INTRAVENOUS | Status: AC
  Administered 2011-12-01: 500 mL via INTRAVENOUS

## 2011-12-01 MED ORDER — HYDROMORPHONE HCL PF 1 MG/ML IJ SOLN
1.0000 mg | Freq: Once | INTRAMUSCULAR | Status: AC
  Administered 2011-12-01: 1 mg via INTRAVENOUS
  Filled 2011-12-01: qty 1

## 2011-12-01 MED ORDER — ONDANSETRON HCL 4 MG/2ML IJ SOLN
4.0000 mg | Freq: Once | INTRAMUSCULAR | Status: AC
  Administered 2011-12-01: 4 mg via INTRAVENOUS
  Filled 2011-12-01: qty 2

## 2011-12-01 MED ORDER — KETOROLAC TROMETHAMINE 30 MG/ML IJ SOLN
30.0000 mg | Freq: Once | INTRAMUSCULAR | Status: DC
  Filled 2011-12-01: qty 1

## 2011-12-01 MED ORDER — SODIUM CHLORIDE 0.9 % IV BOLUS (SEPSIS): 1000.0000 mL | Freq: Once | INTRAVENOUS | Status: DC

## 2011-12-01 MED ORDER — HYDROCODONE-ACETAMINOPHEN 5-500 MG PO TABS: 1.0000 | ORAL_TABLET | Freq: Four times a day (QID) | ORAL | Status: AC | PRN

## 2011-12-01 NOTE — ED Notes (Signed)
C/o R flank pain and vomiting x 35 minutes.  Denies dysuria.

## 2011-12-01 NOTE — ED Notes (Signed)
Patient transported to X-ray 

## 2011-12-01 NOTE — ED Provider Notes (Signed)
History     CSN: [id number]  Arrival date & time 12/01/11  9528   First MD Initiated Contact with Patient 12/01/11 1854      Chief Complaint  Patient presents with  . Flank Pain    (Consider location/radiation/quality/duration/timing/severity/associated sxs/prior treatment) HPI  Patient presents to emergency department complaining of acute onset right lateral scapular/flank pain that began approximately 45 minutes prior to arrival. Patient states that he was very busy about the house today doing odd jobs but nothing of great exertion and then he sat down to have supper and felt acute onset pain in his scapular region that has been persistent and unchanging since onset. Patient denies radiation of pain. Patient states became mildly nauseous and vomited once prior to arrival. States pain is aggravated by movement. Denies alleviating factors. Denies CP, SOB, abdominal pain, diarrhea, cough. Took BC powder PTA without relief of pain.   Per patient, he has baseline renal insufficiency that is being followed by PCP and renal specialist and has been on prednisone for three days because of cervical pain by his orthopedic specialist.   Past Medical History  Diagnosis Date  . Hypertension   . Neck pain   . Kidney stone     History reviewed. No pertinent past surgical history.  No family history on file.  History  Substance Use Topics  . Smoking status: Never Smoker   . Smokeless tobacco: Not on file  . Alcohol Use: No      Review of Systems  All other systems reviewed and are negative.    Allergies  Review of patient's allergies indicates no known allergies.  Home Medications   Current Outpatient Rx  Name Route Sig Dispense Refill  . ACETAMINOPHEN-CODEINE #3 300-30 MG PO TABS Oral Take 1 tablet by mouth every 8 (eight) hours as needed. For pain.    Marland Kitchen ALLOPURINOL 100 MG PO TABS Oral Take 100 mg by mouth daily.    . ASPIRIN EC 81 MG PO TBEC Oral Take 81 mg by mouth daily.     . BUPROPION HCL ER (SR) 150 MG PO TB12 Oral Take 450 mg by mouth daily.    Marland Kitchen FLUOXETINE HCL 20 MG PO CAPS Oral Take 20 mg by mouth daily.    Marland Kitchen GARLIC OIL 1500 MG PO CAPS Oral Take 1 capsule by mouth daily.    Marland Kitchen GLUCOSAMINE HCL 1500 MG PO TABS Oral Take 1 tablet by mouth daily.    Marland Kitchen LAMOTRIGINE 200 MG PO TABS Oral Take 200 mg by mouth daily.    Marland Kitchen LISINOPRIL 20 MG PO TABS Oral Take 20 mg by mouth daily.    Marland Kitchen METHOCARBAMOL 500 MG PO TABS Oral Take 500 mg by mouth every 8 (eight) hours as needed. For muscle spasms.    Marland Kitchen ZOLPIDEM TARTRATE 10 MG PO TABS Oral Take 10 mg by mouth at bedtime as needed. For sleep.      BP 150/86  Pulse 73  Temp(Src) 97.5 F (36.4 C) (Oral)  Resp 18  SpO2 100%  Physical Exam  Nursing note and vitals reviewed. Constitutional: He is oriented to person, place, and time. He appears well-developed and well-nourished. No distress.  HENT:  Head: Normocephalic and atraumatic.  Eyes: Conjunctivae are normal.  Neck: Normal range of motion. Neck supple.  Cardiovascular: Normal rate, regular rhythm, normal heart sounds and intact distal pulses.  Exam reveals no gallop and no friction rub.   No murmur heard. Pulmonary/Chest: Effort normal and breath sounds normal.  No respiratory distress. He has no wheezes. He has no rales. He exhibits no tenderness.  Abdominal: Bowel sounds are normal. He exhibits no distension and no mass. There is no tenderness. There is no rebound and no guarding.  Musculoskeletal: Normal range of motion. He exhibits tenderness. He exhibits no edema.       Point specific tenderness to palpation in right scapular region at a trigger point with muscle spasticity but no crepitus or skin changes. Pain with range of motion his shoulder at point specific trigger point. Remainder of shoulder nontender. No midline final tenderness to palpation.  Neurological: He is alert and oriented to person, place, and time.  Skin: Skin is warm and dry. No rash noted. He is  not diaphoretic. No erythema.  Psychiatric: He has a normal mood and affect.    ED Course  Procedures (including critical care time)  IV dilaudid and PO valium  Patient states pain has resolved and is lying comfortably in bed without complaint.   Labs Reviewed  CBC - Abnormal; Notable for the following:    WBC 10.9 (*)    All other components within normal limits  DIFFERENTIAL - Abnormal; Notable for the following:    Neutrophils Relative 81 (*)    Neutro Abs 8.9 (*)    All other components within normal limits  BASIC METABOLIC PANEL - Abnormal; Notable for the following:    Glucose, Bld 125 (*)    BUN 28 (*)    Creatinine, Ser 1.61 (*)    GFR calc non Af Amer 43 (*)    GFR calc Af Amer 49 (*)    All other components within normal limits  URINALYSIS, ROUTINE W REFLEX MICROSCOPIC  TROPONIN I  D-DIMER, QUANTITATIVE   Ct Abdomen Pelvis Wo Contrast  12/01/2011  *RADIOLOGY REPORT*  Clinical Data: Right-sided flank pain and scapular pain.  History of kidney stones.  CT ABDOMEN AND PELVIS WITHOUT CONTRAST  Technique:  Multidetector CT imaging of the abdomen and pelvis was performed following the standard protocol without intravenous contrast.  Comparison: Renal ultrasound 02/06/2005.  Findings: The lung bases are clear without focal nodule, mass, or airspace disease.  Heart size is normal.  Coronary artery calcifications are evident.  The  The liver and spleen are within normal limits.  A small hiatal hernia is present.  The stomach, duodenum, and pancreas are otherwise within normal limits.  The common bile duct and gallbladder are unremarkable.  The adrenal glands are normal bilaterally.  There are at least two punctate nonobstructive stones within the right kidney.  Three or four punctate nonobstructive stones are evident in the left kidney.  The ureters are within normal limits bilaterally.  There is no obstruction.  The urinary bladder is unremarkable.  The patient is status post partial  colectomy.  The colon is otherwise intact.  The appendix is not discretely visualized and may be surgically absent.  There are no secondary signs of infection.  The small bowel is unremarkable.  Bone windows demonstrate to rightward curvature of the lumbar spine.  Right lateral listhesis is present at to L4-5 with chronic end plate changes.  Chronic end plate changes are present at L5-S1 as well.  Multilevel facet degenerative changes are evident.  IMPRESSION:  1.  Bilateral nonobstructing punctate nephrolithiasis. 2.  No evidence for hydronephrosis. 3.  The atherosclerosis, including coronary artery disease. 4.  Scoliosis as described.  Original Report Authenticated By: Jamesetta Orleans. MATTERN, M.D.   Dg Chest 2 View  12/01/2011  *RADIOLOGY REPORT*  Clinical Data: Chest pain.  CHEST - 2 VIEW  Comparison: None  Findings: The cardiac silhouette, mediastinal and hilar contours are within normal limits.  The lungs are clear.  Mild eventration of the left hemidiaphragm is noted.  No pleural effusion.  The bony thorax is intact.  IMPRESSION: No acute cardiopulmonary findings.  Original Report Authenticated By: P. Loralie Champagne, M.D.    Date: 12/01/2011  Rate: 83  Rhythm: normal sinus rhythm  QRS Axis: normal  Intervals: normal  ST/T Wave abnormalities: normal  Conduction Disutrbances: none  Narrative Interpretation:   Old EKG Reviewed: Sep 23, 2001, No significant changes noted     1. Muscle spasm   2. Back pain   3. Renal insufficiency       MDM  Patient had point specific pain in right scapular region with muscle spasticity at a trigger point. Chest x-ray and CT of abdomen and pelvis showed no acute findings. Patient has baseline mild renal insufficiency and has been on prednisone x3 days which would account for mild elevation in white blood cell count. Patient states his pain is resolved with resolution of pain his blood pressure has decreased. Patient states he has established a close  relationship with primary care physician, renal specialist, in orthopedic specialist but he will followup closely right scapular pain but that he'll return to emergency department for emergent changing or worsening symptoms. Throughout stay patient denied pain in his chest or shortness of breath. D-dimer and troponin are negative and non provocative EKG  Likely origin of pain being MSK and muscle spasm.         Jenness Corner, [place] 12/01/11 2219

## 2011-12-01 NOTE — ED Notes (Signed)
Patient is AOx4 and comfortable with his discharge instructions. 

## 2011-12-02 NOTE — ED Provider Notes (Signed)
Medical screening examination/treatment/procedure(s) were conducted as a shared visit with non-physician practitioner(s) and myself.  I personally evaluated the patient during the encounter   66yo M, c/o right sided lower scapular/flank pain that began approx PTA.  Pt states he had been busy working all day (without discomfort) and when rested and started to eat a meal felt the pain.  Describes as "sharp" worse with movement of torso and palpation of the area.  +TTP right lower thoracic paraspinal muscles.  Work up neg for acute process.  Improved with symptomatic treatment.  Pt wants to go home and will f/u with his PMD.    Laray Anger, DO 12/02/11 1809

## 2012-01-03 ENCOUNTER — Other Ambulatory Visit: Payer: Self-pay | Admitting: Neurosurgery

## 2012-01-03 DIAGNOSIS — M549 Dorsalgia, unspecified: Secondary | ICD-10-CM

## 2012-01-09 ENCOUNTER — Ambulatory Visit
Admission: RE | Admit: 2012-01-09 | Discharge: 2012-01-09 | Disposition: A | Discharge status: Home / Self Care | Payer: Medicare Other | Source: Ambulatory Visit | Attending: Neurosurgery | Admitting: Neurosurgery

## 2012-01-09 DIAGNOSIS — M549 Dorsalgia, unspecified: Secondary | ICD-10-CM

## 2012-01-14 ENCOUNTER — Encounter (HOSPITAL_COMMUNITY): Payer: Self-pay | Admitting: Pharmacy Technician

## 2012-01-16 ENCOUNTER — Encounter (HOSPITAL_COMMUNITY): Payer: Self-pay | Admitting: *Deleted

## 2012-01-16 ENCOUNTER — Observation Stay (HOSPITAL_COMMUNITY)
Admission: EM | Admit: 2012-01-16 | Discharge: 2012-01-17 | Disposition: A | Discharge status: Home / Self Care | Payer: Medicare Other | Attending: Emergency Medicine | Admitting: Emergency Medicine

## 2012-01-16 ENCOUNTER — Encounter (HOSPITAL_COMMUNITY): Payer: Self-pay

## 2012-01-16 ENCOUNTER — Encounter (HOSPITAL_COMMUNITY)
Admission: RE | Admit: 2012-01-16 | Discharge: 2012-01-16 | Disposition: A | Discharge status: Home / Self Care | Payer: Medicare Other | Source: Ambulatory Visit | Attending: Neurosurgery | Admitting: Neurosurgery

## 2012-01-16 DIAGNOSIS — M549 Dorsalgia, unspecified: Secondary | ICD-10-CM

## 2012-01-16 DIAGNOSIS — N289 Disorder of kidney and ureter, unspecified: Secondary | ICD-10-CM | POA: Insufficient documentation

## 2012-01-16 DIAGNOSIS — D649 Anemia, unspecified: Principal | ICD-10-CM | POA: Insufficient documentation

## 2012-01-16 DIAGNOSIS — M5126 Other intervertebral disc displacement, lumbar region: Secondary | ICD-10-CM | POA: Insufficient documentation

## 2012-01-16 DIAGNOSIS — I10 Essential (primary) hypertension: Secondary | ICD-10-CM | POA: Insufficient documentation

## 2012-01-16 HISTORY — DX: Bipolar disorder, unspecified: F31.9

## 2012-01-16 HISTORY — DX: Diverticulitis of intestine, part unspecified, without perforation or abscess without bleeding: K57.92

## 2012-01-16 LAB — SURGICAL PCR SCREEN
MRSA, PCR: NEGATIVE
Staphylococcus aureus: NEGATIVE

## 2012-01-16 LAB — BASIC METABOLIC PANEL
BUN: 22 mg/dL (ref 6–23)
Creatinine, Ser: 1.79 mg/dL — ABNORMAL HIGH (ref 0.50–1.35)
GFR calc non Af Amer: 38 mL/min — ABNORMAL LOW (ref >90)
Glucose, Bld: 85 mg/dL (ref 70–99)
Potassium: 3.8 mEq/L (ref 3.5–5.1)

## 2012-01-16 LAB — DIFFERENTIAL
Basophils Absolute: 0.1 10*3/uL (ref 0.0–0.1)
Basophils Relative: 1 % (ref 0–1)
Eosinophils Absolute: 0.4 10*3/uL (ref 0.0–0.7)
Neutro Abs: 3.4 10*3/uL (ref 1.7–7.7)
Neutrophils Relative %: 56 % (ref 43–77)

## 2012-01-16 LAB — CBC
MCH: 25.9 pg — ABNORMAL LOW (ref 26.0–34.0)
MCHC: 31 g/dL (ref 30.0–36.0)
Platelets: 380 10*3/uL (ref 150–400)

## 2012-01-16 NOTE — Pre-Procedure Instructions (Signed)
[address] [facility]  01/16/2012   Your procedure is scheduled on:  January 24, 2012  Report to [facility] at 0715 AM.  Call this number if you have problems the morning of surgery: [phone]   Remember:   Do not eat food:After Midnight.  May have clear liquids: up to 4 Hours before arrival.  Clear liquids include soda, tea, black coffee, apple or grape juice, broth.  Take these medicines the morning of surgery with A SIP OF WATER:Prozac, Lamicatal   STOP Garlic Oil, Glucosamine, Fish Oil, Aspirin today  Do not wear jewelry, make-up or nail polish.  Do not wear lotions, powders, or perfumes. You may wear deodorant.  Do not shave 48 hours prior to surgery.  Do not bring valuables to the hospital.  Contacts, dentures or bridgework may not be worn into surgery.  Leave suitcase in the car. After surgery it may be brought to your room.  For patients admitted to the hospital, checkout time is 11:00 AM the day of discharge.   Patients discharged the day of surgery will not be allowed to drive home.  Name and phone number of your driver: [facility]  Special Instructions: CHG Shower Use Special Wash: 1/2 bottle night before surgery and 1/2 bottle morning of surgery.   Please read over the following fact sheets that you were given: Pain Booklet, Coughing and Deep Breathing and Surgical Site Infection Prevention

## 2012-01-16 NOTE — Progress Notes (Signed)
Revonda Standard, PA notified of Hgb and Hct.. Pt notified and denies dizziness, shortness of breath with exertion, gross blood in stools, or black or tarry stools. Revonda Standard, [place] notified Dr. Paulino Rily office patients primary care office. Dr. Lindalou Hose office notified.

## 2012-01-16 NOTE — ED Provider Notes (Signed)
11:38 PM Patient has arrived to CDU for holding to continue blood transfusion.  Sign out received from [employer], PA-C. Patient has upcoming surgery scheduled, preop labs showed anemia, patient sent for blood transfusion.  Patient to be discharged home following transfusion.  Patient reports he is feeling fine, no complaints and no needs at this time.    11:42 PM Jordan Bacca, PA-C, to sign patient out to Dr Nicanor Alcon at change of shift. Dr Nicanor Alcon to set disposition upon completion of blood transfusion.    Jordan [name], [place] 01/16/12 [phone]

## 2012-01-16 NOTE — Consult Note (Addendum)
Anesthesia:  Patient is a 66 year old male scheduled for left T6-7 transpedicular microdiskectomy on 01/24/12.  His PAT appointment was earlier today.  I did not personally evaluate him, but was asked to review his labs after 1600 today.  His history includes former smoker, kidney stones, diverticulitis s/p partial colectomy, HTN, neck pain, Bipolar disorder type I.  By current and previous labs, he appears to have renal insufficiency as well.  CXR on 12/01/11 showed no acute cardiopulmonary findings.  EKG on 12/01/11 showed NSR, prolonged QT.  Labs showed a Cr of 1.79 (up from 1.61 on 12/01/11).  WBC 6.0.  H/H down to 7.8/25.2 (was 15.3/43.7 on 12/01/11).  PLT 380.  His PAT nurse reported that Jordan [name] appeared pale today with complaints of a headache.  He also had back pain, which I believe is more chronic.  He denied obvious melena or hematochezia.  He just mailed hemoccult sample to his PCP Dr. Mila Palmer yesterday, but these were routine.  I called his H/H result to Dr. Lindalou Hose office and faxed the results as requested.  I also spoke with Andrey Campanile a nurse at Dr. Ali Lowe office.  She spoke with Jordan [name] and Dr. Paulino Rily and a decision was made to have him go the the ED today for further evaluation and possible transfusion and admission for work-up of acute/subacute anemia.    Addendum: 01/17/12 1250  Jordan [name] received two units of PRBC is the ED and was discharged home earlier today.  His Hgb was up to 8.8.  He was asymptomatic and no source of active bleeding was identified (hemoccult was negative in the ED).  Neurosurgery was updated by the ED physician with plans to recheck his Hgb preoperatively and proceed if stable.  Dr. Jordan Likes has requested that a T&C 2 Units PRBC be ordered preoperatively (done).  I have also ordered a repeat BMET to evaluate his renal function post-transfusion as well as coags.  Anesthesiologist Dr. Katrinka Blazing updated on patient's anemia and recent transfusion.

## 2012-01-16 NOTE — ED Notes (Signed)
Pt had blood work done this am and was told to come to ED for low hemoglobin. Pt with no hx of the same. Denies abdominal pain. Reports T5-T6 chronic back pain. Denies dark stool.

## 2012-01-16 NOTE — ED Notes (Signed)
Report given to CDU nurse

## 2012-01-16 NOTE — ED Notes (Signed)
Patient here for blood transfusion for surgery.  To have back surgery next Friday on his back.  Skin pale cool to touch

## 2012-01-16 NOTE — ED Notes (Signed)
Home [phone]  Cell [phone]

## 2012-01-17 LAB — DIFFERENTIAL
Basophils Relative: 2 % — ABNORMAL HIGH (ref 0–1)
Eosinophils Absolute: 0.4 10*3/uL (ref 0.0–0.7)
Eosinophils Relative: 7 % — ABNORMAL HIGH (ref 0–5)
Lymphs Abs: 2 10*3/uL (ref 0.7–4.0)
Monocytes Absolute: 0.6 10*3/uL (ref 0.1–1.0)
Monocytes Relative: 12 % (ref 3–12)

## 2012-01-17 LAB — CBC
HCT: 27.7 % — ABNORMAL LOW (ref 39.0–52.0)
Hemoglobin: 8.8 g/dL — ABNORMAL LOW (ref 13.0–17.0)
MCH: 26 pg (ref 26.0–34.0)
MCHC: 31.8 g/dL (ref 30.0–36.0)
MCV: 81.7 fL (ref 78.0–100.0)
RBC: 3.39 MIL/uL — ABNORMAL LOW (ref 4.22–5.81)

## 2012-01-17 LAB — FERRITIN: Ferritin: 8 ng/mL — ABNORMAL LOW (ref 22–322)

## 2012-01-17 LAB — FOLATE: Folate: 13.1 ng/mL

## 2012-01-17 LAB — IRON AND TIBC
Iron: <10 ug/dL — ABNORMAL LOW (ref 42–135)
UIBC: 456 ug/dL — ABNORMAL HIGH (ref 125–400)

## 2012-01-17 MED ORDER — ACETAMINOPHEN 325 MG PO TABS
650.0000 mg | ORAL_TABLET | Freq: Once | ORAL | Status: AC
  Administered 2012-01-17: 650 mg via ORAL
  Filled 2012-01-17: qty 2

## 2012-01-17 MED ORDER — HYDROCODONE-ACETAMINOPHEN 5-325 MG PO TABS
1.0000 | ORAL_TABLET | Freq: Once | ORAL | Status: AC
  Administered 2012-01-17: 1 via ORAL
  Filled 2012-01-17: qty 1

## 2012-01-17 MED ORDER — HYDROCODONE-ACETAMINOPHEN 5-325 MG PO TABS
2.0000 | ORAL_TABLET | Freq: Once | ORAL | Status: AC
  Administered 2012-01-17: 2 via ORAL
  Filled 2012-01-17: qty 2

## 2012-01-17 NOTE — ED Notes (Signed)
Pt d/c'd from IV, continuous pulse oximetry and blood pressure cuff; pt getting dressed to be discharged home 

## 2012-01-17 NOTE — ED Notes (Signed)
Report received, assumed care.  

## 2012-01-17 NOTE — ED Provider Notes (Signed)
8:15 AM Report received from EDP Palumbo. Pt in the CDU while receiving PRBC x 2.  66 y/o M with PMH of self-reported stable mild renal insufficiency and HTN as well as L4-L5 disc herniation with surgery scheduled with Dr Dutch Quint next Friday, presented to ED overnight with c/c of anemia. Pre-op labs drawn yesterday with low Hgb, pt sent by Dr Paulino Rily to ED for blood transfusion. He denies that he was experiencing any SOB, CP, dizziness, lightheadedness, or weakness. Denies any known hx of anemia. Denies gross bloody or black tarry stools.   Currently, pt is alert, oriented, and in NAD resting comfortable on stretcher reading a newspaper. His skin appears pale, though warm and dry. Head Horace/AT. PERRL. Palpebral conjunctiva pale. Oral mucosa moist. Neck supple. Lungs CTAB. Heart RRR. Abd soft, NT. MSK exam without tenderness, though pain to the mid-thoracic back is elicited with certain movements. Full ROM to all extremities. No edema to BLE.   No needs at this time.  Hgb increased from 7.8 to 8.8 after 2 units PRBC. EDP Hyman Hopes to consult NSGY for further recommendations in patient's plan of care.       10:23 AM EDP has spoken with NSGY, who agree that pt can be d/c home with recheck of Hgb by Dr Dutch Quint this week as he is asymptomatic and has no source of active bleeding (neg hemoccult in ED earlier). Pt agrees with this plan. Return precautions discussed.  Shaaron Adler, PA-C 01/17/12 1024

## 2012-01-17 NOTE — ED Notes (Signed)
Patient ambulating in hallway. States he is tired of laying in bed and wants to stretch his legs and back

## 2012-01-17 NOTE — ED Provider Notes (Signed)
Signed out to Lorenz Coaster and Dr. Hyman Hopes pending disposition   Paige Vanderwoude Smitty Cords, MD 01/17/12 [phone]

## 2012-01-17 NOTE — Discharge Instructions (Signed)
Follow up with Dr. Jordan Likes tomorrow for further evaluation of anemia and to have your blood count rechecked.  If he would prefer, you can also see Dr Paulino Rily for this. You should return to the ER if you develop blood in your stool, shortness of breath, generalized weakness or lightheadedness.        Anemia, Frequently Asked Questions WHAT ARE THE SYMPTOMS OF ANEMIA?  Headache.   Difficulty thinking.   Fatigue.   Shortness of breath.   Weakness.   Rapid heartbeat.  AT WHAT POINT ARE PEOPLE CONSIDERED ANEMIC?  This varies with gender and age.   Both hemoglobin (Hgb) and hematocrit values are used to define anemia. These lab values are obtained from a complete blood count (CBC) test. This is performed at a caregiver's office.   The normal range of hemoglobin values for adult men is 14.0 g/dL to 45.4 g/dL. For nonpregnant women, values are 12.3 g/dL to 09.8 g/dL.   The World Health Organization defines anemia as less than 12 g/dL for nonpregnant women and less than 13 g/dL for men.   For adult males, the average normal hematocrit is 46%, and the range is 40% to 52%.   For adult females, the average normal hematocrit is 41%, and the range is 35% to 47%.   Values that fall below the lower limits can be a sign of anemia and should have further checking (evaluation).  GROUPS OF PEOPLE WHO ARE AT RISK FOR DEVELOPING ANEMIA INCLUDE:   Infants who are breastfed or taking a formula that is not fortified with iron.   Children going through a rapid growth spurt. The iron available can not keep up with the needs for a red cell mass which must grow with the child.   Women in childbearing years. They need iron because of blood loss during menstruation.   Pregnant women. The growing fetus creates a high demand for iron.   People with ongoing gastrointestinal blood loss are at risk of developing iron deficiency.   Individuals with leukemia or cancer who must receive chemotherapy or radiation  to treat their disease. The drugs or radiation used to treat these diseases often decreases the bone marrow's ability to make cells of all classes. This includes red blood cells, white blood cells, and platelets.   Individuals with chronic inflammatory conditions such as rheumatoid arthritis or chronic infections.   The elderly.  ARE SOME TYPES OF ANEMIA INHERITED?   Yes, some types of anemia are due to inherited or genetic defects.   Sickle cell anemia. This occurs most often in people of African, African American, and Mediterranean descent.   Thalassemia (or Cooley's anemia). This type is found in people of Mediterranean and Southeast Asian descent. These types of anemia are common.   [place]. This is rare.  CAN CERTAIN MEDICATIONS CAUSE A PERSON TO BECOME ANEMIC?  Yes. For example, drugs to fight cancer (chemotherapeutic agents) often cause anemia. These drugs can slow the bone marrow's ability to make red blood cells. If there are not enough red blood cells, the body does not get enough oxygen. WHAT HEMATOCRIT LEVEL IS REQUIRED TO DONATE BLOOD?  The lower limit of an acceptable hematocrit for blood donors is 38%. If you have a low hematocrit value, you should schedule an appointment with your caregiver. ARE BLOOD TRANSFUSIONS COMMONLY USED TO CORRECT ANEMIA, AND ARE THEY DANGEROUS?  They are used to treat anemia as a last resort. Your caregiver will find the cause of the anemia and  correct it if possible. Most blood transfusions are given because of excessive bleeding at the time of surgery, with trauma, or because of bone marrow suppression in patients with cancer or leukemia on chemotherapy. Blood transfusions are safer than ever before. We also know that blood transfusions affect the immune system and may increase certain risks. There is also a concern for human error. In 1/16,000 transfusions, a patient receives a transfusion of blood that is not matched with his or her blood type.  WHAT  IS IRON DEFICIENCY ANEMIA AND CAN I CORRECT IT BY CHANGING MY DIET?  Iron is an essential part of hemoglobin. Without enough hemoglobin, anemia develops and the body does not get the right amount of oxygen. Iron deficiency anemia develops after the body has had a low level of iron for a long time. This is either caused by blood loss, not taking in or absorbing enough iron, or increased demands for iron (like pregnancy or rapid growth).  Foods from animal origin such as beef, chicken, and pork, are good sources of iron. Be sure to have one of these foods at each meal. Vitamin C helps your body absorb iron. Foods rich in Vitamin C include citrus, bell pepper, strawberries, spinach and cantaloupe. In some cases, iron supplements may be needed in order to correct the iron deficiency. In the case of poor absorption, extra iron may have to be given directly into the vein through a needle (intravenously). I HAVE BEEN DIAGNOSED WITH IRON DEFICIENCY ANEMIA AND MY CAREGIVER PRESCRIBED IRON SUPPLEMENTS. HOW LONG WILL IT TAKE FOR MY BLOOD TO BECOME NORMAL?  It depends on the degree of anemia at the beginning of treatment. Most people with mild to moderate iron deficiency, anemia will correct the anemia over a period of 2 to 3 months. But after the anemia is corrected, the iron stored by the body is still low. Caregivers often suggest an additional 6 months of oral iron therapy once the anemia has been reversed. This will help prevent the iron deficiency anemia from quickly happening again. Non-anemic adult males should take iron supplements only under the direction of a doctor, too much iron can cause liver damage.  MY HEMOGLOBIN IS 9 G/DL AND I AM SCHEDULED FOR SURGERY. SHOULD I POSTPONE THE SURGERY?  If you have Hgb of 9, you should discuss this with your caregiver right away. Many patients with similar hemoglobin levels have had surgery without problems. If minimal blood loss is expected for a minor procedure, no  treatment may be necessary.  If a greater blood loss is expected for more extensive procedures, you should ask your caregiver about being treated with erythropoietin and iron. This is to accelerate the recovery of your hemoglobin to a normal level before surgery. An anemic patient who undergoes high-blood-loss surgery has a greater risk of surgical complications and need for a blood transfusion, which also carries some risk.  I HAVE BEEN TOLD THAT HEAVY MENSTRUAL PERIODS CAUSE ANEMIA. IS THERE ANYTHING I CAN DO TO PREVENT THE ANEMIA?  Anemia that results from heavy periods is usually due to iron deficiency. You can try to meet the increased demands for iron caused by the heavy monthly blood loss by increasing the intake of iron-rich foods. Iron supplements may be required. Discuss your concerns with your caregiver. WHAT CAUSES ANEMIA DURING PREGNANCY?  Pregnancy places major demands on the body. The mother must meet the needs of both her body and her growing baby. The body needs enough iron and folate  to make the right amount of red blood cells. To prevent anemia while pregnant, the mother should stay in close contact with her caregiver.  Be sure to eat a diet that has foods rich in iron and folate like liver and dark green leafy vegetables. Folate plays an important role in the normal development of a baby's spinal cord. Folate can help prevent serious disorders like spina bifida. If your diet does not provide adequate nutrients, you may want to talk with your caregiver about nutritional supplements.  WHAT IS THE RELATIONSHIP BETWEEN FIBROID TUMORS AND ANEMIA IN WOMEN?  The relationship is usually caused by the increased menstrual blood loss caused by fibroids. Good iron intake may be required to prevent iron deficiency anemia from developing.  Document Released: 05/15/2004 Document Revised: 09/26/2011 Document Reviewed: 10/30/2010 [facility] Patient Information 2012 [place], [place].

## 2012-01-17 NOTE — ED Notes (Signed)
Informed patient and/or family of status. No voiced complaints presently. NAD. Awaiting disposition

## 2012-01-17 NOTE — Progress Notes (Signed)
Pt is a 66 year old man who had preop lab work done in anticipation of lumbar disc surgery next week.  He was found to be anemic, and was reportedly sent in by his physician, Mila Palmer, M.D., to be transfused.  He denies hematemesis, melena, weight loss.  Exam shows him to have a mildly pale appearance.  Lungs clear, heart sounds WNL, Abdomen soft and nontender.  Stool negative for blood per Ms. Schinlever's exam.  Lab workup shows Hb 7.8, Hct 25.2.  MCH was low at 25.9.  Anemia panel ordered.  Pt to receive 2 units of packed cells in CDU.  He will need followup with Dr. Paulino Rily regarding results of anemia panel.

## 2012-01-17 NOTE — ED Provider Notes (Signed)
Medical screening examination/treatment/procedure(s) were conducted as a shared visit with non-physician practitioner(s) and myself.  I personally evaluated the patient during the encounter Jordan Human, MD Physician Signed Emergency Medicine Progress Notes 01/17/2012 3:06 PM   Pt is a 66 year old man who had preop lab work done in anticipation of lumbar disc surgery next week. He was found to be anemic, and was reportedly sent in by his physician, Mila Palmer, M.D., to be transfused. He denies hematemesis, melena, weight loss. Exam shows him to have a mildly pale appearance. Lungs clear, heart sounds WNL, Abdomen soft and nontender. Stool negative for blood per Ms. Schinlever's exam. Lab workup shows Hb 7.8, Hct 25.2. MCH was low at 25.9. Anemia panel ordered. Pt to receive 2 units of packed cells in CDU. He will need followup with Dr. Paulino Rily regarding results of anemia panel.        [name], MD 01/17/12 [phone]

## 2012-01-18 LAB — TYPE AND SCREEN
ABO/RH(D): B POS
Antibody Screen: NEGATIVE
Unit division: 0
Unit division: 0

## 2012-01-22 NOTE — ED Provider Notes (Signed)
Medical screening examination/treatment/procedure(s) were conducted as a shared visit with non-physician practitioner(s) and myself.  I personally evaluated the patient during the encounter  Discussed results with nsgy on call who recommends f/u as outpatient next week for recheck of Hgb and transfuse if needed.  Forbes Cellar, MD 01/22/12 [phone]

## 2012-01-23 MED ORDER — CEFAZOLIN SODIUM-DEXTROSE 2-3 GM-% IV SOLR
2.0000 g | INTRAVENOUS | Status: AC
  Administered 2012-01-24: 2 g via INTRAVENOUS
  Filled 2012-01-23: qty 50

## 2012-01-23 NOTE — Progress Notes (Signed)
Observation review is complete for 3/28 visit.

## 2012-01-24 ENCOUNTER — Encounter (HOSPITAL_COMMUNITY): Payer: Self-pay | Admitting: Vascular Surgery

## 2012-01-24 ENCOUNTER — Encounter (HOSPITAL_COMMUNITY): Payer: Self-pay | Admitting: *Deleted

## 2012-01-24 ENCOUNTER — Ambulatory Visit (HOSPITAL_COMMUNITY): Payer: Medicare Other | Admitting: Vascular Surgery

## 2012-01-24 ENCOUNTER — Ambulatory Visit (HOSPITAL_COMMUNITY): Payer: Medicare Other

## 2012-01-24 ENCOUNTER — Encounter (HOSPITAL_COMMUNITY)
Admission: RE | Disposition: A | Discharge status: Home / Self Care | Payer: Self-pay | Source: Ambulatory Visit | Attending: Neurosurgery

## 2012-01-24 ENCOUNTER — Ambulatory Visit (HOSPITAL_COMMUNITY)
Admission: RE | Admit: 2012-01-24 | Discharge: 2012-01-24 | Disposition: A | Discharge status: Home / Self Care | Payer: Medicare Other | Source: Ambulatory Visit | Attending: Neurosurgery | Admitting: Neurosurgery

## 2012-01-24 DIAGNOSIS — F319 Bipolar disorder, unspecified: Secondary | ICD-10-CM | POA: Insufficient documentation

## 2012-01-24 DIAGNOSIS — M5124 Other intervertebral disc displacement, thoracic region: Secondary | ICD-10-CM | POA: Diagnosis present

## 2012-01-24 DIAGNOSIS — I10 Essential (primary) hypertension: Secondary | ICD-10-CM | POA: Insufficient documentation

## 2012-01-24 DIAGNOSIS — M5104 Intervertebral disc disorders with myelopathy, thoracic region: Secondary | ICD-10-CM | POA: Insufficient documentation

## 2012-01-24 DIAGNOSIS — D649 Anemia, unspecified: Secondary | ICD-10-CM | POA: Diagnosis present

## 2012-01-24 LAB — CBC
HCT: 28.3 % — ABNORMAL LOW (ref 39.0–52.0)
HCT: 28.8 % — ABNORMAL LOW (ref 39.0–52.0)
Hemoglobin: 8.8 g/dL — ABNORMAL LOW (ref 13.0–17.0)
Hemoglobin: 9.1 g/dL — ABNORMAL LOW (ref 13.0–17.0)
MCH: 25.2 pg — ABNORMAL LOW (ref 26.0–34.0)
MCH: 25.9 pg — ABNORMAL LOW (ref 26.0–34.0)
MCHC: 31.1 g/dL (ref 30.0–36.0)
MCHC: 31.6 g/dL (ref 30.0–36.0)
MCV: 82.1 fL (ref 78.0–100.0)
RDW: 16.3 % — ABNORMAL HIGH (ref 11.5–15.5)

## 2012-01-24 LAB — BASIC METABOLIC PANEL
BUN: 19 mg/dL (ref 6–23)
CO2: 21 mEq/L (ref 19–32)
Chloride: 107 mEq/L (ref 96–112)
Glucose, Bld: 90 mg/dL (ref 70–99)
Potassium: 3.6 mEq/L (ref 3.5–5.1)

## 2012-01-24 SURGERY — THORACIC DISCECTOMY
Anesthesia: General | Site: Back | Laterality: Left | Wound class: Clean

## 2012-01-24 MED ORDER — ONDANSETRON HCL 4 MG/2ML IJ SOLN: 4.0000 mg | Freq: Once | INTRAMUSCULAR | Status: DC | PRN

## 2012-01-24 MED ORDER — SIMVASTATIN 20 MG PO TABS
20.0000 mg | ORAL_TABLET | Freq: Every evening | ORAL | Status: DC
  Filled 2012-01-24: qty 1

## 2012-01-24 MED ORDER — THROMBIN 20000 UNITS EX KIT
PACK | CUTANEOUS | Status: DC | PRN
  Administered 2012-01-24: 11:00:00 via TOPICAL

## 2012-01-24 MED ORDER — POLYETHYLENE GLYCOL 3350 17 G PO PACK
17.0000 g | PACK | Freq: Every day | ORAL | Status: DC | PRN
  Filled 2012-01-24: qty 1

## 2012-01-24 MED ORDER — HYDROMORPHONE HCL PF 1 MG/ML IJ SOLN
INTRAMUSCULAR | Status: AC
  Filled 2012-01-24: qty 1

## 2012-01-24 MED ORDER — FENTANYL CITRATE 0.05 MG/ML IJ SOLN
INTRAMUSCULAR | Status: DC | PRN
  Administered 2012-01-24: 100 ug via INTRAVENOUS
  Administered 2012-01-24: 50 ug via INTRAVENOUS

## 2012-01-24 MED ORDER — ALLOPURINOL 100 MG PO TABS
100.0000 mg | ORAL_TABLET | Freq: Every day | ORAL | Status: DC
  Filled 2012-01-24: qty 1

## 2012-01-24 MED ORDER — ACETAMINOPHEN 325 MG PO TABS: 650.0000 mg | ORAL_TABLET | ORAL | Status: DC | PRN

## 2012-01-24 MED ORDER — HYDROCODONE-ACETAMINOPHEN 5-325 MG PO TABS: 1.0000 | ORAL_TABLET | ORAL | Status: AC | PRN

## 2012-01-24 MED ORDER — DEXTROSE 5 % IV SOLN
INTRAVENOUS | Status: DC | PRN
  Administered 2012-01-24: 10:00:00 via INTRAVENOUS

## 2012-01-24 MED ORDER — DEXAMETHASONE SODIUM PHOSPHATE 10 MG/ML IJ SOLN
10.0000 mg | Freq: Once | INTRAMUSCULAR | Status: AC
  Administered 2012-01-24: 10 mg via INTRAVENOUS
  Filled 2012-01-24: qty 1

## 2012-01-24 MED ORDER — HYDROMORPHONE HCL PF 1 MG/ML IJ SOLN
0.2500 mg | INTRAMUSCULAR | Status: DC | PRN
  Administered 2012-01-24: 0.5 mg via INTRAVENOUS
  Administered 2012-01-24: 0.25 mg via INTRAVENOUS
  Administered 2012-01-24: 0.5 mg via INTRAVENOUS

## 2012-01-24 MED ORDER — SODIUM CHLORIDE 0.9 % IV SOLN
INTRAVENOUS | Status: DC | PRN
  Administered 2012-01-24: 11:00:00 via INTRAVENOUS

## 2012-01-24 MED ORDER — SODIUM CHLORIDE 0.9 % IV SOLN
INTRAVENOUS | Status: AC
  Filled 2012-01-24: qty 500

## 2012-01-24 MED ORDER — CEFAZOLIN SODIUM 1-5 GM-% IV SOLN
1.0000 g | Freq: Three times a day (TID) | INTRAVENOUS | Status: DC
  Filled 2012-01-24 (×2): qty 50

## 2012-01-24 MED ORDER — SODIUM CHLORIDE 0.9 % IJ SOLN: 3.0000 mL | Freq: Two times a day (BID) | INTRAMUSCULAR | Status: DC

## 2012-01-24 MED ORDER — SODIUM CHLORIDE 0.9 % IV SOLN: 250.0000 mL | INTRAVENOUS | Status: DC

## 2012-01-24 MED ORDER — LAMOTRIGINE 200 MG PO TABS
200.0000 mg | ORAL_TABLET | Freq: Every day | ORAL | Status: DC
  Filled 2012-01-24: qty 1

## 2012-01-24 MED ORDER — SODIUM CHLORIDE 0.9 % IR SOLN
Status: DC | PRN
  Administered 2012-01-24: 11:00:00

## 2012-01-24 MED ORDER — CYCLOBENZAPRINE HCL 10 MG PO TABS: 10.0000 mg | ORAL_TABLET | Freq: Three times a day (TID) | ORAL | Status: DC | PRN

## 2012-01-24 MED ORDER — LIDOCAINE HCL (CARDIAC) 20 MG/ML IV SOLN
INTRAVENOUS | Status: DC | PRN
  Administered 2012-01-24: 100 mg via INTRAVENOUS

## 2012-01-24 MED ORDER — FLUOXETINE HCL 20 MG PO CAPS
20.0000 mg | ORAL_CAPSULE | Freq: Every day | ORAL | Status: DC
  Filled 2012-01-24: qty 1

## 2012-01-24 MED ORDER — MENTHOL 3 MG MT LOZG: 1.0000 | LOZENGE | OROMUCOSAL | Status: DC | PRN

## 2012-01-24 MED ORDER — KETOROLAC TROMETHAMINE 30 MG/ML IJ SOLN: 30.0000 mg | Freq: Four times a day (QID) | INTRAMUSCULAR | Status: DC

## 2012-01-24 MED ORDER — HYDROMORPHONE HCL PF 1 MG/ML IJ SOLN: 0.5000 mg | INTRAMUSCULAR | Status: DC | PRN

## 2012-01-24 MED ORDER — ACETAMINOPHEN 650 MG RE SUPP: 650.0000 mg | RECTAL | Status: DC | PRN

## 2012-01-24 MED ORDER — SENNA 8.6 MG PO TABS: 1.0000 | ORAL_TABLET | Freq: Two times a day (BID) | ORAL | Status: DC

## 2012-01-24 MED ORDER — BACITRACIN 50000 UNITS IM SOLR
INTRAMUSCULAR | Status: AC
  Filled 2012-01-24: qty 1

## 2012-01-24 MED ORDER — PHENOL 1.4 % MT LIQD
1.0000 | OROMUCOSAL | Status: DC | PRN
Start: 2012-01-24 — End: 2012-01-24

## 2012-01-24 MED ORDER — BISACODYL 10 MG RE SUPP: 10.0000 mg | Freq: Every day | RECTAL | Status: DC | PRN

## 2012-01-24 MED ORDER — MIDAZOLAM HCL 5 MG/5ML IJ SOLN
INTRAMUSCULAR | Status: DC | PRN
  Administered 2012-01-24 (×2): 1 mg via INTRAVENOUS

## 2012-01-24 MED ORDER — CYCLOBENZAPRINE HCL 10 MG PO TABS: 10.0000 mg | ORAL_TABLET | Freq: Three times a day (TID) | ORAL | Status: AC | PRN

## 2012-01-24 MED ORDER — OXYCODONE-ACETAMINOPHEN 5-325 MG PO TABS
1.0000 | ORAL_TABLET | ORAL | Status: DC | PRN
Start: 2012-01-24 — End: 2012-01-24

## 2012-01-24 MED ORDER — SODIUM CHLORIDE 0.9 % IJ SOLN: 3.0000 mL | INTRAMUSCULAR | Status: DC | PRN

## 2012-01-24 MED ORDER — LACTATED RINGERS IV SOLN
INTRAVENOUS | Status: DC | PRN
  Administered 2012-01-24 (×2): via INTRAVENOUS

## 2012-01-24 MED ORDER — ZOLPIDEM TARTRATE 5 MG PO TABS: 5.0000 mg | ORAL_TABLET | Freq: Every evening | ORAL | Status: DC | PRN

## 2012-01-24 MED ORDER — GLYCOPYRROLATE 0.2 MG/ML IJ SOLN
INTRAMUSCULAR | Status: DC | PRN
  Administered 2012-01-24: .8 mg via INTRAVENOUS

## 2012-01-24 MED ORDER — HYDROCODONE-ACETAMINOPHEN 5-325 MG PO TABS
1.0000 | ORAL_TABLET | ORAL | Status: DC | PRN
  Administered 2012-01-24: 2 via ORAL
  Filled 2012-01-24: qty 2

## 2012-01-24 MED ORDER — SUCCINYLCHOLINE CHLORIDE 20 MG/ML IJ SOLN
INTRAMUSCULAR | Status: DC | PRN
  Administered 2012-01-24: 140 mg via INTRAVENOUS

## 2012-01-24 MED ORDER — FLEET ENEMA 7-19 GM/118ML RE ENEM
1.0000 | ENEMA | Freq: Once | RECTAL | Status: DC | PRN
  Filled 2012-01-24: qty 1

## 2012-01-24 MED ORDER — MORPHINE SULFATE 2 MG/ML IJ SOLN: 0.0500 mg/kg | INTRAMUSCULAR | Status: DC | PRN

## 2012-01-24 MED ORDER — PROPOFOL 10 MG/ML IV EMUL
INTRAVENOUS | Status: DC | PRN
  Administered 2012-01-24: 120 mg via INTRAVENOUS

## 2012-01-24 MED ORDER — 0.9 % SODIUM CHLORIDE (POUR BTL) OPTIME
TOPICAL | Status: DC | PRN
  Administered 2012-01-24: 1000 mL

## 2012-01-24 MED ORDER — LISINOPRIL 20 MG PO TABS
20.0000 mg | ORAL_TABLET | Freq: Every day | ORAL | Status: DC
  Filled 2012-01-24: qty 1

## 2012-01-24 MED ORDER — ROCURONIUM BROMIDE 100 MG/10ML IV SOLN
INTRAVENOUS | Status: DC | PRN
  Administered 2012-01-24: 50 mg via INTRAVENOUS

## 2012-01-24 MED ORDER — NEOSTIGMINE METHYLSULFATE 1 MG/ML IJ SOLN
INTRAMUSCULAR | Status: DC | PRN
  Administered 2012-01-24: 5 mg via INTRAVENOUS

## 2012-01-24 MED ORDER — BUPIVACAINE HCL (PF) 0.25 % IJ SOLN
INTRAMUSCULAR | Status: DC | PRN
  Administered 2012-01-24: 20 mL

## 2012-01-24 MED ORDER — ONDANSETRON HCL 4 MG/2ML IJ SOLN: 4.0000 mg | INTRAMUSCULAR | Status: DC | PRN

## 2012-01-24 MED ORDER — ALUM & MAG HYDROXIDE-SIMETH 200-200-20 MG/5ML PO SUSP: 30.0000 mL | Freq: Four times a day (QID) | ORAL | Status: DC | PRN

## 2012-01-24 SURGICAL SUPPLY — 69 items
ADH SKN CLS APL DERMABOND .7 (GAUZE/BANDAGES/DRESSINGS) ×1
APL SKNCLS STERI-STRIP NONHPOA (GAUZE/BANDAGES/DRESSINGS) ×1
BAG DECANTER FOR FLEXI CONT (MISCELLANEOUS) ×2 IMPLANT
BENZOIN TINCTURE PRP APPL 2/3 (GAUZE/BANDAGES/DRESSINGS) ×2 IMPLANT
BLADE SURG ROTATE 9660 (MISCELLANEOUS) IMPLANT
BRUSH SCRUB EZ PLAIN DRY (MISCELLANEOUS) ×2 IMPLANT
BUR CUTTER 7.0 ROUND (BURR) ×2 IMPLANT
BUR MATCHSTICK NEURO 3.0 LAGG (BURR) ×2 IMPLANT
CANISTER SUCTION 2500CC (MISCELLANEOUS) ×2 IMPLANT
CATH THORACIC 28FR (CATHETERS) IMPLANT
CLIP TI LARGE 6 (CLIP) IMPLANT
CLIP TI MEDIUM 6 (CLIP) IMPLANT
CLOTH BEACON ORANGE TIMEOUT ST (SAFETY) ×2 IMPLANT
CONNECTOR PH 6IN1 Y STRL (MISCELLANEOUS) IMPLANT
CONT SPEC 4OZ CLIKSEAL STRL BL (MISCELLANEOUS) ×2 IMPLANT
DECANTER SPIKE VIAL GLASS SM (MISCELLANEOUS) ×1 IMPLANT
DERMABOND ADVANCED (GAUZE/BANDAGES/DRESSINGS) ×1
DERMABOND ADVANCED .7 DNX12 (GAUZE/BANDAGES/DRESSINGS) ×1 IMPLANT
DRAPE LAPAROTOMY 100X72X124 (DRAPES) ×2 IMPLANT
DRAPE MICROSCOPE ZEISS OPMI (DRAPES) ×2 IMPLANT
DRAPE POUCH INSTRU U-SHP 10X18 (DRAPES) ×2 IMPLANT
DRAPE PROXIMA HALF (DRAPES) IMPLANT
DRAPE SURG 17X23 STRL (DRAPES) ×8 IMPLANT
ELECT BLADE 6.5 EXT (BLADE) ×1 IMPLANT
ELECT REM PT RETURN 9FT ADLT (ELECTROSURGICAL) ×2
ELECTRODE REM PT RTRN 9FT ADLT (ELECTROSURGICAL) ×1 IMPLANT
GAUZE SPONGE 4X4 16PLY XRAY LF (GAUZE/BANDAGES/DRESSINGS) ×1 IMPLANT
GLOVE BIOGEL PI IND STRL 7.0 (GLOVE) IMPLANT
GLOVE BIOGEL PI IND STRL 8.5 (GLOVE) IMPLANT
GLOVE BIOGEL PI INDICATOR 7.0 (GLOVE) ×1
GLOVE BIOGEL PI INDICATOR 8.5 (GLOVE) ×1
GLOVE ECLIPSE 8.5 STRL (GLOVE) ×3 IMPLANT
GLOVE EXAM NITRILE LRG STRL (GLOVE) IMPLANT
GLOVE EXAM NITRILE MD LF STRL (GLOVE) ×1 IMPLANT
GLOVE EXAM NITRILE XL STR (GLOVE) IMPLANT
GLOVE EXAM NITRILE XS STR PU (GLOVE) IMPLANT
GLOVE SS BIOGEL STRL SZ 6.5 (GLOVE) IMPLANT
GLOVE SUPERSENSE BIOGEL SZ 6.5 (GLOVE) ×2
GLOVE SURG SS PI 6.5 STRL IVOR (GLOVE) ×1 IMPLANT
GOWN BRE IMP SLV AUR LG STRL (GOWN DISPOSABLE) ×1 IMPLANT
GOWN BRE IMP SLV AUR XL STRL (GOWN DISPOSABLE) ×2 IMPLANT
GOWN STRL REIN 2XL LVL4 (GOWN DISPOSABLE) ×1 IMPLANT
KIT BASIN OR (CUSTOM PROCEDURE TRAY) ×2 IMPLANT
KIT ROOM TURNOVER OR (KITS) ×2 IMPLANT
LOOP VESSEL MAXI BLUE (MISCELLANEOUS) IMPLANT
MARKER SKIN DUAL TIP RULER LAB (MISCELLANEOUS) IMPLANT
NDL SPNL 22GX3.5 QUINCKE BK (NEEDLE) ×1 IMPLANT
NEEDLE HYPO 22GX1.5 SAFETY (NEEDLE) ×2 IMPLANT
NEEDLE SPNL 22GX3.5 QUINCKE BK (NEEDLE) ×2 IMPLANT
NS IRRIG 1000ML POUR BTL (IV SOLUTION) ×2 IMPLANT
PACK CHEST (CUSTOM PROCEDURE TRAY) ×1 IMPLANT
PACK LAMINECTOMY NEURO (CUSTOM PROCEDURE TRAY) ×1 IMPLANT
PAD ARMBOARD 7.5X6 YLW CONV (MISCELLANEOUS) ×6 IMPLANT
PATTIES SURGICAL .5 X.5 (GAUZE/BANDAGES/DRESSINGS) ×1 IMPLANT
PATTIES SURGICAL 1X1 (DISPOSABLE) ×1 IMPLANT
RUBBERBAND STERILE (MISCELLANEOUS) ×4 IMPLANT
SPONGE GAUZE 4X4 12PLY (GAUZE/BANDAGES/DRESSINGS) ×2 IMPLANT
SPONGE SURGIFOAM ABS GEL 100 (HEMOSTASIS) ×2 IMPLANT
SPONGE SURGIFOAM ABS GEL SZ50 (HEMOSTASIS) IMPLANT
STAPLER VISISTAT 35W (STAPLE) ×2 IMPLANT
STRIP CLOSURE SKIN 1/2X4 (GAUZE/BANDAGES/DRESSINGS) ×2 IMPLANT
SUT BONE WAX W31G (SUTURE) ×2 IMPLANT
SUT VIC AB 2-0 CT1 18 (SUTURE) ×2 IMPLANT
SUT VIC AB 3-0 SH 8-18 (SUTURE) ×2 IMPLANT
SYR 20ML ECCENTRIC (SYRINGE) ×2 IMPLANT
TAPE CLOTH 4X10 WHT NS (GAUZE/BANDAGES/DRESSINGS) ×1 IMPLANT
TOWEL OR 17X24 6PK STRL BLUE (TOWEL DISPOSABLE) ×2 IMPLANT
TOWEL OR 17X26 10 PK STRL BLUE (TOWEL DISPOSABLE) ×2 IMPLANT
WATER STERILE IRR 1000ML POUR (IV SOLUTION) ×2 IMPLANT

## 2012-01-24 NOTE — Transfer of Care (Signed)
Immediate Anesthesia Transfer of Care Note  Patient: Jordan [name]  Procedure(s) Performed: Procedure(s) (LRB): THORACIC DISCECTOMY (Left)  Patient Location: PACU  Anesthesia Type: General  Level of Consciousness: awake, oriented and patient cooperative  Airway & Oxygen Therapy: Patient Spontanous Breathing and Patient connected to face mask oxygen  Post-op Assessment: Report given to PACU RN, Post -op Vital signs reviewed and stable, Patient moving all extremities and Patient able to stick tongue midline  Post vital signs: Reviewed and stable  Complications: No apparent anesthesia complications

## 2012-01-24 NOTE — Anesthesia Postprocedure Evaluation (Signed)
Anesthesia Post Note  Patient: Jordan [name]  Procedure(s) Performed: Procedure(s) (LRB): THORACIC DISCECTOMY (Left)  Anesthesia type: general  Patient location: PACU  Post pain: Pain level controlled  Post assessment: Patient's Cardiovascular Status Stable  Last Vitals:  Filed Vitals:   01/24/12 1300  BP:   Pulse:   Temp: 36.2 C  Resp:     Post vital signs: Reviewed and stable  Level of consciousness: sedated  Complications: No apparent anesthesia complications

## 2012-01-24 NOTE — Discharge Summary (Signed)
Physician Discharge Summary  Patient ID: Jordan [name] MRN: [id number] DOB/AGE: 1945/11/30 66 y.o.  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 Postoperatively his pain is much improved. Neurologically intact. He feels much better.  Patient it was significantly anemic at presentation. This is chronic microcytic anemia. This will be worked up as an outpatient by his primary care physician over the next week.  Consults: None  Significant Diagnostic Studies:   Treatments:   Discharge Exam: Blood pressure 152/96, pulse 85, temperature 98 F (36.7 C), temperature source Oral, resp. rate 16, height 5\' 6"  (1.676 m), weight 88.724 kg (195 lb 9.6 oz), SpO2 95.00%. Awake and alert oriented appropriate her cranial nerve function is intact. Motor and sensory function extremities is normal. Wound clean dry and intact. Chest and abdomen benign.  Disposition: 01-Home or Self Care   Medication List  As of 01/24/2012  3:43 PM   TAKE these medications         allopurinol 100 MG tablet   Commonly known as: ZYLOPRIM   Take 100 mg by mouth daily.      aspirin 325 MG EC tablet   Take 325 mg by mouth daily as needed. For pain      [facility] GLUCOSAMINE 1500 MG Tabs   Generic drug: Glucosamine HCl   Take 1 tablet by mouth daily.      cyclobenzaprine 10 MG tablet   Commonly known as: FLEXERIL   Take 1 tablet (10 mg total) by mouth 3 (three) times daily as needed for muscle spasms.      fish oil-omega-3 fatty acids 1000 MG capsule   Take 1 g by mouth daily.      FLUoxetine 20 MG capsule   Commonly known as: PROZAC   Take 20 mg by mouth daily.      GARLIC 1500 1500 MG Caps   Generic drug: Garlic Oil   Take 1 capsule by  mouth daily.      HYDROcodone-acetaminophen 5-325 MG per tablet   Commonly known as: NORCO   Take 1-2 tablets by mouth every 4 (four) hours as needed for pain.      lamoTRIgine 200 MG tablet   Commonly known as: LAMICTAL   Take 200 mg by mouth daily.      lisinopril 20 MG tablet   Commonly known as: PRINIVIL,ZESTRIL   Take 20 mg by mouth daily.      simvastatin 20 MG tablet   Commonly known as: ZOCOR   Take 20 mg by mouth every evening.      zolpidem 10 MG tablet   Commonly known as: AMBIEN   Take 10 mg by mouth at bedtime as needed. For sleep.           Follow-up Information    Follow up with Meryl Ponder A, MD. Call in 1 week.   Contact information:   [address] [place] [address] [phone]          Signed: Temple Pacini 01/24/2012, 3:43 PM

## 2012-01-24 NOTE — Preoperative (Signed)
Beta Blockers   Reason not to administer Beta Blockers:Not Applicable 

## 2012-01-24 NOTE — Discharge Instructions (Signed)
Wound Care Keep incision covered and dry for one week.  If you shower prior to then, cover incision with plastic wrap.  You may remove outer bandage after one week and shower.  Do not put any creams, lotions, or ointments on incision. Leave steri-strips on Back.  They will fall off by themselves. Activity Walk each and every day, increasing distance each day. No lifting greater than 5 lbs.  Avoid excessive neck motion. No driving for 2 weeks; may ride as a passenger locally. If provided with back brace, wear when out of bed.  It is not necessary to wear brace in bed. Diet Resume your normal diet.  Return to Work Will be discussed at you follow up appointment. Call Your Doctor If Any of These Occur Redness, drainage, or swelling at the wound.  Temperature greater than 101 degrees. Severe pain not relieved by pain medication. Incision starts to come apart. Follow Up Appt Call today for appointment in 1-2 weeks ([phone]) or for problems.  If you have any hardware placed in your spine, you will need an x-ray before your appointment.

## 2012-01-24 NOTE — Brief Op Note (Signed)
01/24/2012  11:43 AM  PATIENT:  Jordan [name]  66 y.o. male  PRE-OPERATIVE DIAGNOSIS:  Herniated Nucleus Pulposus  POST-OPERATIVE DIAGNOSIS:  Herniated Nucleus Pulposus  PROCEDURE:  Procedure(s) (LRB): THORACIC DISCECTOMY (Left)  SURGEON:  Surgeon(s) and Role:    * Temple Pacini, MD - Primary    * Barnett Abu, MD - Assisting  PHYSICIAN ASSISTANT:   ASSISTANTS:Mister Krahenbuhl Elsner   ANESTHESIA:   none  EBL:  Total I/O In: 2850 [I.V.:2550; Blood:300] Out: 300 [Blood:300]  BLOOD ADMINISTERED:one unit CC PRBC  DRAINS: none   LOCAL MEDICATIONS USED:  MARCAINE     SPECIMEN:  No Specimen  DISPOSITION OF SPECIMEN:  N/A  COUNTS:  YES  TOURNIQUET:  * No tourniquets in log *  DICTATION: .Dragon Dictation  PLAN OF CARE: Admit to inpatient   PATIENT DISPOSITION:  PACU - hemodynamically stable.   Delay start of Pharmacological VTE agent (>24hrs) due to surgical blood loss or risk of bleeding: yes

## 2012-01-24 NOTE — Anesthesia Preprocedure Evaluation (Addendum)
Anesthesia Evaluation  Patient identified by MRN, date of birth, ID band Patient awake    Reviewed: Allergy & Precautions, H&P , NPO status , Patient's Chart, lab work & pertinent test results, reviewed documented beta blocker date and time   Airway Mallampati: I TM Distance: >3 FB Neck ROM: Full    Dental  (+) Teeth Intact and Caps   Pulmonary sleep apnea and Continuous Positive Airway Pressure Ventilation ,          Cardiovascular hypertension, Pt. on medications     Neuro/Psych  Headaches,    GI/Hepatic   Endo/Other    Renal/GU      Musculoskeletal   Abdominal   Peds  Hematology   Anesthesia Other Findings Right incisor crowned  Reproductive/Obstetrics                        Anesthesia Physical Anesthesia Plan  ASA: III  Anesthesia Plan: General   Post-op Pain Management:    Induction: Intravenous, Rapid sequence and Cricoid pressure planned  Airway Management Planned: Oral ETT  Additional Equipment:   Intra-op Plan:   Post-operative Plan: Extubation in OR  Informed Consent: I have reviewed the patients History and Physical, chart, labs and discussed the procedure including the risks, benefits and alternatives for the proposed anesthesia with the patient or authorized representative who has indicated his/her understanding and acceptance.     Plan Discussed with: CRNA and Surgeon  Anesthesia Plan Comments: (H/O anemia of unknown origin. Was transfused two units PRBC in the ER. Now Hb - 8.8 Also drank cup of water at 7:30am - OK to proceed. Arta Bruce MD)       Anesthesia Quick Evaluation

## 2012-01-24 NOTE — Op Note (Signed)
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  Surgeon:Brenae Lasecki A.Maylie Ashton, M.D.  Asst. Surgeon: Danielle Dess  Anesthesia: General  Indication: 66 year old male with severe thoracic pain. Workup demonstrates evidence of a left paracentral T6-7 disc herniation with cord compression. Patient presents now for transpedicular microdiscectomy in hopes of improving his symptoms.  Operative note: After induction of anesthesia, patient positioned on a Wilson frame and appropriate padded. Patient's thoracic region was prepped and draped sterilely. 10 blade was used to make a linear skin incision overlying the C6-7 interspace. This is carried down sharply in the midline. Supper off Sexton performed exposing the lamina and facet joints of T6 and T7 on the left. Intraoperative x-rays taken level was confirmed laminotomy was then performed using high-speed drill and Kerrison rongeurs to remove the inferior aspect of lamina of T6 superior aspect of lamina of T7 medial aspect of the T6-7 facet joint and the superior medial aspect of the T7 pedicle. Microscope was brought field these were microdissection. Epidural venous plexus was quite limited and cut. The space and incised a 15 blade and rectangle her fashion. The space was then entered by drilling through the pedicle of T7 and undercutting the spinal canal. Disc material was then pushed into the newly created cavity and a discectomy was performed using pituitary rongeurs Epstein curettes and other down pushing correct curettes. All elements of the central disc herniation appeared to be resected. There was no evidence of injury to thecal sac or nerve roots. Wound is then. With and bike solution. Gelfoam was placed topically for hemostasis which was found to be  good. Microscope and retracted system were removed. Hemostasis of the muscle was achieved with electrocautery. Wound is then closed in layers with Vicryl sutures. Steri-Strips and sterile dressing were applied. Were no apparent complications. The patient tolerated the procedure well. He returns to the recovery room postop.

## 2012-01-24 NOTE — Anesthesia Procedure Notes (Signed)
Procedure Name: Intubation Date/Time: 01/24/2012 9:40 AM Performed by: Marni Griffon Pre-anesthesia Checklist: Patient identified, Emergency Drugs available, Suction available and Patient being monitored Patient Re-evaluated:Patient Re-evaluated prior to inductionOxygen Delivery Method: Circle system utilized Preoxygenation: Pre-oxygenation with 100% oxygen Intubation Type: IV induction Ventilation: Mask ventilation without difficulty Laryngoscope Size: Mac and 3 Grade View: Grade I Tube type: Oral Tube size: 7.5 mm Number of attempts: 1 Airway Equipment and Method: Stylet Placement Confirmation: ETT inserted through vocal cords under direct vision,  breath sounds checked- equal and bilateral and positive ETCO2 Secured at: 22 (cm at teeth) cm Tube secured with: Tape Dental Injury: Teeth and Oropharynx as per pre-operative assessment

## 2012-01-24 NOTE — H&P (Signed)
Jordan [name] is an 66 y.o. male.   Chief Complaint: Thoracic pain HPI:66 year old male with severe posterior dorsal back pain with some radiation into his chest wall. No symptoms of numbness paresthesias or weakness involving his lower extremities. Symptoms have failed conservative management. Workup demonstrates a large key 67 paracentral disc herniation with spinal cord compression. Patient presents now for decompression in hopes of improving his symptoms now.  Preoperative studies reveal significant anemia. Patient denies any current active blood loss be at GI or respiratory. Plan is for transfusion during surgery an extensive workup of his anemia postoperatively.  Past Medical History  Diagnosis Date  . Hypertension   . Neck pain   . Kidney stone   . Bipolar 1 disorder   . Diverticulitis     Past Surgical History  Procedure Date  . Appendectomy     66 years old  . Partial colectomy     Family History  Problem Relation Age of Onset  . Anesthesia problems Neg Hx    Social History:  reports that he quit smoking about 28 years ago. He does not have any smokeless tobacco history on file. He reports that he does not drink alcohol or use illicit drugs.  Allergies: No Known Allergies  Medications Prior to Admission  Medication Dose Route Frequency Provider Last Rate Last Dose  . bacitracin 40981 UNITS injection           . ceFAZolin (ANCEF) IVPB 2 g/50 mL premix  2 g Intravenous 60 min Pre-Op Temple Pacini, MD      . dexamethasone (DECADRON) injection 10 mg  10 mg Intravenous Once Temple Pacini, MD      . HYDROmorphone (DILAUDID) injection 0.25-0.5 mg  0.25-0.5 mg Intravenous Q5 min PRN Aubery Lapping, MD      . morphine 2 MG/ML injection 4.436 mg  0.05 mg/kg Intravenous Q10 min PRN Aubery Lapping, MD      . ondansetron [facility]) injection 4 mg  4 mg Intravenous Once PRN Aubery Lapping, MD      . sodium chloride 0.9 % infusion            Medications Prior to Admission    Medication Sig Dispense Refill  . allopurinol (ZYLOPRIM) 100 MG tablet Take 100 mg by mouth daily.      Marland Kitchen FLUoxetine (PROZAC) 20 MG capsule Take 20 mg by mouth daily.      . Garlic Oil (GARLIC 1500) 1500 MG CAPS Take 1 capsule by mouth daily.      . Glucosamine HCl ([facility] GLUCOSAMINE) 1500 MG TABS Take 1 tablet by mouth daily.      Marland Kitchen lamoTRIgine (LAMICTAL) 200 MG tablet Take 200 mg by mouth daily.      Marland Kitchen lisinopril (PRINIVIL,ZESTRIL) 20 MG tablet Take 20 mg by mouth daily.      Marland Kitchen zolpidem (AMBIEN) 10 MG tablet Take 10 mg by mouth at bedtime as needed. For sleep.        Results for orders placed during the hospital encounter of 01/24/12 (from the past 48 hour(s))  APTT     Status: Normal   Collection Time   01/24/12  8:11 AM      Component Value Range Comment   aPTT 28  24 - 37 (seconds)   BASIC METABOLIC PANEL     Status: Abnormal   Collection Time   01/24/12  8:11 AM      Component Value Range Comment   Sodium 139  135 - 145 (mEq/L)    Potassium 3.6  3.5 - 5.1 (mEq/L)    Chloride 107  96 - 112 (mEq/L)    CO2 21  19 - 32 (mEq/L)    Glucose, Bld 90  70 - 99 (mg/dL)    BUN 19  6 - 23 (mg/dL)    Creatinine, Ser 1.61 (*) 0.50 - 1.35 (mg/dL)    Calcium 8.8  8.4 - 10.5 (mg/dL)    GFR calc non Af Amer 43 (*) >90 (mL/min)    GFR calc Af Amer 50 (*) >90 (mL/min)   CBC     Status: Abnormal   Collection Time   01/24/12  8:11 AM      Component Value Range Comment   WBC 4.2  4.0 - 10.5 (K/uL)    RBC 3.49 (*) 4.22 - 5.81 (MIL/uL)    Hemoglobin 8.8 (*) 13.0 - 17.0 (g/dL)    HCT 09.6 (*) 04.5 - 52.0 (%)    MCV 81.1  78.0 - 100.0 (fL)    MCH 25.2 (*) 26.0 - 34.0 (pg)    MCHC 31.1  30.0 - 36.0 (g/dL)    RDW 40.9 (*) 81.1 - 15.5 (%)    Platelets 309  150 - 400 (K/uL)   PROTIME-INR     Status: Normal   Collection Time   01/24/12  8:11 AM      Component Value Range Comment   Prothrombin Time 13.4  11.6 - 15.2 (seconds)    INR 1.00  0.00 - 1.49    TYPE AND SCREEN     Status: Normal (Preliminary  result)   Collection Time   01/24/12  8:20 AM      Component Value Range Comment   ABO/RH(D) B POS      Antibody Screen NEG      Sample Expiration 01/27/2012      Unit Number [id number]      Blood Component Type RED CELLS,LR      Unit division 00      Status of Unit ALLOCATED      Transfusion Status OK TO TRANSFUSE      Crossmatch Result Compatible      Unit Number [id number]      Blood Component Type RED CELLS,LR      Unit division 00      Status of Unit ALLOCATED      Transfusion Status OK TO TRANSFUSE      Crossmatch Result Compatible     PREPARE RBC (CROSSMATCH)     Status: Normal   Collection Time   01/24/12  8:20 AM      Component Value Range Comment   Order Confirmation ORDER PROCESSED BY BLOOD BANK      No results found.  Review of Systems  Constitutional: Negative.   Eyes: Negative.   Respiratory: Negative.   Cardiovascular: Negative.   Gastrointestinal: Negative.   Genitourinary: Negative.   Musculoskeletal: Negative.   Skin: Negative.   Neurological: Negative.   Endo/Heme/Allergies: Negative.   Psychiatric/Behavioral: Negative.     Blood pressure 157/95, pulse 94, temperature 97.4 F (36.3 C), temperature source Oral, resp. rate 16, height 5\' 6"  (1.676 m), weight 88.724 kg (195 lb 9.6 oz), SpO2 94.00%. Physical Exam  Constitutional: He is oriented to person, place, and time. He appears well-developed and well-nourished.  HENT:  Head: Normocephalic and atraumatic.  Right Ear: External ear normal.  Left Ear: External ear normal.  Nose: Nose normal.  Mouth/Throat: Oropharynx is clear and  moist.  Eyes: Conjunctivae and EOM are normal. Pupils are equal, round, and reactive to light. Right eye exhibits no discharge. Left eye exhibits no discharge.  Neck: Normal range of motion. Neck supple. No tracheal deviation present. No thyromegaly present.  Cardiovascular: Normal rate, regular rhythm, normal heart sounds and intact distal pulses.   Respiratory: Effort normal  and breath sounds normal. No respiratory distress. He has no wheezes.  GI: Soft. Bowel sounds are normal. He exhibits no distension. There is no tenderness.  Musculoskeletal: Normal range of motion. He exhibits no edema and no tenderness.  Neurological: He is alert and oriented to person, place, and time. He has normal reflexes. He displays normal reflexes. No cranial nerve deficit. He exhibits normal muscle tone. Coordination normal.  Skin: Skin is warm and dry. No rash noted. No erythema. There is pallor.  Psychiatric: His behavior is normal. Judgment and thought content normal.     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 Plan left T6-7 transpedicular microdiscectomy. Risks and benefits have been explained patient wishes to proceed  Hedaya Latendresse A 01/24/2012, 9:18 AM

## 2012-01-27 LAB — TYPE AND SCREEN
ABO/RH(D): B POS
Unit division: 0

## 2012-01-29 LAB — POCT I-STAT 4, (NA,K, GLUC, HGB,HCT)
Glucose, Bld: 95 mg/dL (ref 70–99)
Hemoglobin: 8.2 g/dL — ABNORMAL LOW (ref 13.0–17.0)
Potassium: 4.3 mEq/L (ref 3.5–5.1)
Sodium: 141 mEq/L (ref 135–145)

## 2012-06-17 ENCOUNTER — Other Ambulatory Visit: Payer: Self-pay | Admitting: Gastroenterology

## 2012-10-01 ENCOUNTER — Encounter: Payer: Self-pay | Admitting: Family Medicine

## 2012-10-01 ENCOUNTER — Ambulatory Visit (INDEPENDENT_AMBULATORY_CARE_PROVIDER_SITE_OTHER): Payer: Medicare Other | Admitting: Family Medicine

## 2012-10-01 VITALS — BP 152/122 | HR 80 | Temp 97.7°F | Resp 16

## 2012-10-01 DIAGNOSIS — G43509 Persistent migraine aura without cerebral infarction, not intractable, without status migrainosus: Secondary | ICD-10-CM

## 2012-10-01 MED ORDER — PROMETHAZINE HCL 25 MG/ML IJ SOLN
25.0000 mg | Freq: Once | INTRAMUSCULAR | Status: AC
  Administered 2012-10-01: 25 mg via INTRAMUSCULAR

## 2012-10-01 MED ORDER — SUMATRIPTAN SUCCINATE 6 MG/0.5ML ~~LOC~~ SOLN: 6.0000 mg | SUBCUTANEOUS | Status: DC | PRN

## 2012-10-01 MED ORDER — KETOROLAC TROMETHAMINE 60 MG/2ML IM SOLN
60.0000 mg | Freq: Once | INTRAMUSCULAR | Status: AC
  Administered 2012-10-01 (×2): 60 mg via INTRAMUSCULAR

## 2012-10-01 NOTE — Progress Notes (Signed)
  Subjective:    Patient ID: Jordan [name], male    DOB: [birth year], 66 y.o.   MRN: [id number] Chief Complaint  Patient presents with  . Migraine    x 2-3 days    HPI  Has a lifelong h/o migraines treated with imitrex but decreased as he aged and so hasn't had a migraine in 4-5 yrs and stopped keeping imitrex at home.  This HA which in bilateral at apex of head started about 3d ago and has continued to dramatically worsen.  Tried some ibuprofen w/o any relief at all.  Does have some nausea with it. Does have visual changes w/ aura but no aura currently but severe photo/phonophobia. Pt laying in completely dark clinical exam room in fetal position.  Review of Systems  Constitutional: Positive for activity change, appetite change and fatigue. Negative for fever, chills and diaphoresis.  HENT: Negative for hearing loss, ear pain, congestion, rhinorrhea, trouble swallowing, neck pain, neck stiffness, sinus pressure, tinnitus and ear discharge.   Eyes: Positive for photophobia and visual disturbance. Negative for pain, discharge and redness.  Gastrointestinal: Positive for nausea. Negative for vomiting, abdominal pain and diarrhea.  Skin: Negative for rash.  Neurological: Positive for headaches. Negative for dizziness, tremors, seizures, syncope, facial asymmetry, speech difficulty, weakness, light-headedness and numbness.  Hematological: Negative for adenopathy.  Psychiatric/Behavioral: Positive for sleep disturbance.      BP 152/122  Pulse 80  Temp 97.7 F (36.5 C) (Oral)  Resp 16 Objective:   Physical Exam  Constitutional: He is oriented to person, place, and time. He appears well-developed and well-nourished. No distress.  HENT:  Head: Normocephalic and atraumatic.  Right Ear: Tympanic membrane, external ear and ear canal normal.  Left Ear: Tympanic membrane, external ear and ear canal normal.  Nose: Nose normal.  Mouth/Throat: Uvula is midline, oropharynx is clear and moist and  mucous membranes are normal. No oropharyngeal exudate.  Eyes: Conjunctivae normal and EOM are normal. Pupils are equal, round, and reactive to light. No scleral icterus.  Neck: Normal range of motion. Neck supple. No thyromegaly present.  Pulmonary/Chest: Effort normal.  Lymphadenopathy:    He has no cervical adenopathy.  Neurological: He is alert and oriented to person, place, and time. He displays normal reflexes. No cranial nerve deficit or sensory deficit. He exhibits normal muscle tone. Coordination and gait normal.  Skin: Skin is warm and dry. He is not diaphoretic.  Psychiatric: He has a normal mood and affect. His behavior is normal.      Assessment & Plan:   1. Migraine aura, persistent  SUMAtriptan (IMITREX) 6 MG/0.5ML SOLN injection, ketorolac (TORADOL) injection 60 mg, promethazine (PHENERGAN) injection 25 mg  Toradol and phenergan IM x 1 in office now for relief.  Take IM imitrex asap. Pt has someone to drive him home.  2. Elevated BP, no h/o HTN - likely caused by pain but elev BP can also cause HA. Encouraged pt to recheck at home or pharmacy as HA resolves and if still elev or HA persists, RTC asap for further eval.

## 2012-11-02 ENCOUNTER — Other Ambulatory Visit: Payer: Self-pay | Admitting: Neurosurgery

## 2012-11-02 DIAGNOSIS — M47812 Spondylosis without myelopathy or radiculopathy, cervical region: Secondary | ICD-10-CM

## 2012-11-11 ENCOUNTER — Other Ambulatory Visit: Payer: Self-pay | Admitting: Neurosurgery

## 2012-11-11 ENCOUNTER — Ambulatory Visit
Admission: RE | Admit: 2012-11-11 | Discharge: 2012-11-11 | Disposition: A | Discharge status: Home / Self Care | Payer: Medicare Other | Source: Ambulatory Visit | Attending: Neurosurgery | Admitting: Neurosurgery

## 2012-11-11 DIAGNOSIS — M47812 Spondylosis without myelopathy or radiculopathy, cervical region: Secondary | ICD-10-CM

## 2012-11-20 ENCOUNTER — Other Ambulatory Visit: Payer: Self-pay | Admitting: Neurosurgery

## 2012-11-21 ENCOUNTER — Encounter (HOSPITAL_COMMUNITY): Payer: Self-pay | Admitting: Emergency Medicine

## 2012-11-21 ENCOUNTER — Emergency Department (HOSPITAL_COMMUNITY)
Admission: EM | Admit: 2012-11-21 | Discharge: 2012-11-22 | Disposition: A | Discharge status: Home / Self Care | Payer: Medicare Other | Attending: Emergency Medicine | Admitting: Emergency Medicine

## 2012-11-21 DIAGNOSIS — Z87891 Personal history of nicotine dependence: Secondary | ICD-10-CM | POA: Insufficient documentation

## 2012-11-21 DIAGNOSIS — Z87442 Personal history of urinary calculi: Secondary | ICD-10-CM | POA: Insufficient documentation

## 2012-11-21 DIAGNOSIS — G43909 Migraine, unspecified, not intractable, without status migrainosus: Secondary | ICD-10-CM | POA: Insufficient documentation

## 2012-11-21 DIAGNOSIS — I10 Essential (primary) hypertension: Secondary | ICD-10-CM | POA: Insufficient documentation

## 2012-11-21 DIAGNOSIS — R110 Nausea: Secondary | ICD-10-CM | POA: Insufficient documentation

## 2012-11-21 DIAGNOSIS — Z79899 Other long term (current) drug therapy: Secondary | ICD-10-CM | POA: Insufficient documentation

## 2012-11-21 DIAGNOSIS — F309 Manic episode, unspecified: Secondary | ICD-10-CM | POA: Insufficient documentation

## 2012-11-21 DIAGNOSIS — Z8739 Personal history of other diseases of the musculoskeletal system and connective tissue: Secondary | ICD-10-CM | POA: Insufficient documentation

## 2012-11-21 DIAGNOSIS — M129 Arthropathy, unspecified: Secondary | ICD-10-CM | POA: Insufficient documentation

## 2012-11-21 DIAGNOSIS — Z7982 Long term (current) use of aspirin: Secondary | ICD-10-CM | POA: Insufficient documentation

## 2012-11-21 DIAGNOSIS — Z8719 Personal history of other diseases of the digestive system: Secondary | ICD-10-CM | POA: Insufficient documentation

## 2012-11-21 DIAGNOSIS — F411 Generalized anxiety disorder: Secondary | ICD-10-CM | POA: Insufficient documentation

## 2012-11-21 NOTE — ED Notes (Signed)
Pt alert, arrives from home, c/o migraine headache, onset several days ago, states "in line for cervical fusion, i know what causing it", resp even unlabored, skin pwd, speech clear, ambulates to triage, steady gait noted

## 2012-11-22 MED ORDER — DIPHENHYDRAMINE HCL 50 MG/ML IJ SOLN
12.5000 mg | Freq: Once | INTRAMUSCULAR | Status: AC
  Administered 2012-11-22: 12.5 mg via INTRAVENOUS
  Filled 2012-11-22: qty 1

## 2012-11-22 MED ORDER — KETOROLAC TROMETHAMINE 30 MG/ML IJ SOLN
30.0000 mg | Freq: Once | INTRAMUSCULAR | Status: AC
  Administered 2012-11-22: 30 mg via INTRAVENOUS
  Filled 2012-11-22: qty 1

## 2012-11-22 MED ORDER — METOCLOPRAMIDE HCL 5 MG/ML IJ SOLN
10.0000 mg | Freq: Once | INTRAMUSCULAR | Status: AC
  Administered 2012-11-22: 10 mg via INTRAVENOUS
  Filled 2012-11-22: qty 2

## 2012-11-22 MED ORDER — SUMATRIPTAN SUCCINATE 6 MG/0.5ML ~~LOC~~ SOLN: 6.0000 mg | SUBCUTANEOUS | Status: DC | PRN

## 2012-11-22 MED ORDER — SUMATRIPTAN SUCCINATE 6 MG/0.5ML ~~LOC~~ SOLN
6.0000 mg | Freq: Once | SUBCUTANEOUS | Status: AC
  Administered 2012-11-22: 6 mg via SUBCUTANEOUS
  Filled 2012-11-22: qty 0.5

## 2012-11-22 MED ORDER — HYDROMORPHONE HCL PF 1 MG/ML IJ SOLN
0.5000 mg | Freq: Once | INTRAMUSCULAR | Status: AC
  Administered 2012-11-22: 0.5 mg via INTRAVENOUS
  Filled 2012-11-22: qty 1

## 2012-11-22 NOTE — ED Notes (Signed)
[facility], 

## 2012-11-22 NOTE — ED Provider Notes (Signed)
History     CSN: [id number]  Arrival date & time 11/21/12  2327   First MD Initiated Contact with Patient 11/22/12 0010      Chief Complaint  Patient presents with  . Migraine    (Consider location/radiation/quality/duration/timing/severity/associated sxs/prior treatment) HPI Comments: Ha migraine HA out of his Imitrex took OTC meds without relief now nausea and typical HA   Patient is a 67 y.o. male presenting with migraines. The history is provided by the patient.  Migraine This is a recurrent problem. Associated symptoms include headaches and nausea. Pertinent negatives include no vomiting.    Past Medical History  Diagnosis Date  . Hypertension   . Neck pain   . Kidney stone   . Bipolar 1 disorder   . Diverticulitis   . Anxiety   . Arthritis     Past Surgical History  Procedure Date  . Appendectomy     67 years old  . Partial colectomy   . Spine surgery 2013    Family History  Problem Relation Age of Onset  . Anesthesia problems Neg Hx     History  Substance Use Topics  . Smoking status: Former Smoker -- 1.5 packs/day for 20 years    Quit date: 10/22/1983  . Smokeless tobacco: Not on file  . Alcohol Use: No      Review of Systems  Gastrointestinal: Positive for nausea. Negative for vomiting.  Skin: Negative for wound.  Neurological: Positive for headaches.    Allergies  Review of patient's allergies indicates no known allergies.  Home Medications   Current Outpatient Rx  Name  Route  Sig  Dispense  Refill  . ALLOPURINOL 100 MG PO TABS   Oral   Take 100 mg by mouth at bedtime.          . ASPIRIN 325 MG PO TBEC   Oral   Take 325 mg by mouth daily as needed. For pain         . OMEGA-3 FATTY ACIDS 1000 MG PO CAPS   Oral   Take 1 g by mouth daily.         Marland Kitchen FLUOXETINE HCL 20 MG PO CAPS   Oral   Take 20 mg by mouth daily.         Marland Kitchen GARLIC OIL 1500 MG PO CAPS   Oral   Take 1 capsule by mouth daily.         Marland Kitchen LAMOTRIGINE 200  MG PO TABS   Oral   Take 200 mg by mouth daily.         Marland Kitchen LISINOPRIL 20 MG PO TABS   Oral   Take 20 mg by mouth daily.         Marland Kitchen SIMVASTATIN 20 MG PO TABS   Oral   Take 20 mg by mouth every evening.         Marland Kitchen ZOLPIDEM TARTRATE 10 MG PO TABS   Oral   Take 10 mg by mouth at bedtime as needed. For sleep.         . SUMATRIPTAN SUCCINATE 6 MG/0.5ML [place] SOLN   Subcutaneous   Inject 0.5 mLs (6 mg total) into the skin every 2 (two) hours as needed for migraine or headache.   1 mL   11   . SUMATRIPTAN SUCCINATE 6 MG/0.5ML [place] SOLN   Subcutaneous   Inject 0.5 mLs (6 mg total) into the skin every 2 (two) hours as needed for migraine or headache. F  0.5 mL   2     BP 158/98  Pulse 62  Temp 97.5 F (36.4 C) (Oral)  Resp 16  SpO2 94%  Physical Exam  Constitutional: He appears well-developed.  HENT:  Head: Normocephalic.  Neck: Normal range of motion.  Abdominal: Soft.  Musculoskeletal: Normal range of motion.  Neurological: He is alert.  Skin: Skin is warm.    ED Course  Procedures (including critical care time)  Labs Reviewed - No data to display No results found.   1. Migraine       MDM   HA now 2/10 ready to go home but needs a RX for imitrex        [name], NP 11/22/12 0617  [name], NP 11/22/12 [id number]

## 2012-11-22 NOTE — ED Notes (Signed)
Pt ambulated to bathroom 

## 2012-11-22 NOTE — ED Notes (Signed)
NP at bedside.

## 2012-11-22 NOTE — ED Notes (Signed)
Pt had wallet and pocket knife discovered during stay. Pt reported that he did not intend to harm himself. The pocket knife is just something he carries everywhere. Security wanded pt belongings and placed it in bag along with his wallet. Returned Counselling psychologist with patient.

## 2012-11-23 NOTE — ED Provider Notes (Signed)
Medical screening examination/treatment/procedure(s) were performed by non-physician practitioner and as supervising physician I was immediately available for consultation/collaboration.    Vida Roller, MD 11/23/12 [phone]

## 2012-11-26 ENCOUNTER — Encounter (HOSPITAL_COMMUNITY): Payer: Self-pay | Admitting: Respiratory Therapy

## 2012-11-27 ENCOUNTER — Ambulatory Visit (INDEPENDENT_AMBULATORY_CARE_PROVIDER_SITE_OTHER): Payer: Medicare Other | Admitting: Family Medicine

## 2012-11-27 VITALS — BP 172/96 | HR 96 | Temp 97.4°F | Resp 16 | Ht 66.0 in | Wt 202.0 lb

## 2012-11-27 DIAGNOSIS — O10019 Pre-existing essential hypertension complicating pregnancy, unspecified trimester: Secondary | ICD-10-CM

## 2012-11-27 DIAGNOSIS — I10 Essential (primary) hypertension: Secondary | ICD-10-CM

## 2012-11-27 MED ORDER — LISINOPRIL-HYDROCHLOROTHIAZIDE 20-12.5 MG PO TABS: 1.0000 | ORAL_TABLET | Freq: Every day | ORAL | Status: DC

## 2012-11-27 MED ORDER — METOPROLOL SUCCINATE ER 50 MG PO TB24: 50.0000 mg | ORAL_TABLET | Freq: Every day | ORAL | Status: DC

## 2012-11-27 NOTE — Progress Notes (Signed)
Subjective: 67 year old man who is scheduled for surgery on his neck in a week. His blood pressures been running high. His primary care start him on some lisinopril few days ago. He noticed his readings were very high, with numbers of a diastolic of 113 a believe he said this morning. He was concerned and came on in here. He denies any chest pains palpitations or dyspnea. He knows he can have surgery unless his pressures are controlled  Objective: Chest clear. Heart regular. Abdomen soft nontender. No ankle edema.  Assessment: Uncontrolled hypertension (somehow the wrong diagnosis got in the computer of 80 part of hypertension and I cannot get it removed)  Cervical disc disease  Plan: Changed to lisinopril HCT 20/12.5+ metoprolol 50 mg one daily. If his pressure is not better by early in the week he is to get rechecked. We will try to get this fairly aggressively lowered so that he can have his surgery.

## 2012-11-27 NOTE — Patient Instructions (Signed)
Stopped the lisinopril  Start the [employer].  Begin the metoprolol 50 mg one daily today  Either see your primary or return to see me on Monday or Tuesday afternoon if your blood pressure continues to run greater than 140/90

## 2012-11-28 ENCOUNTER — Ambulatory Visit (INDEPENDENT_AMBULATORY_CARE_PROVIDER_SITE_OTHER): Payer: Medicare Other | Admitting: Family Medicine

## 2012-11-28 VITALS — BP 139/92 | HR 67 | Temp 97.9°F | Resp 24 | Ht 66.0 in | Wt 201.0 lb

## 2012-11-28 DIAGNOSIS — Z9189 Other specified personal risk factors, not elsewhere classified: Secondary | ICD-10-CM

## 2012-11-28 DIAGNOSIS — I10 Essential (primary) hypertension: Secondary | ICD-10-CM

## 2012-11-28 DIAGNOSIS — F068 Other specified mental disorders due to known physiological condition: Secondary | ICD-10-CM

## 2012-11-28 DIAGNOSIS — R0989 Other specified symptoms and signs involving the circulatory and respiratory systems: Secondary | ICD-10-CM

## 2012-11-28 DIAGNOSIS — N189 Chronic kidney disease, unspecified: Secondary | ICD-10-CM

## 2012-11-28 LAB — BASIC METABOLIC PANEL
Calcium: 11.2 mg/dL — ABNORMAL HIGH (ref 8.4–10.5)
Creat: 1.96 mg/dL — ABNORMAL HIGH (ref 0.50–1.35)
Glucose, Bld: 84 mg/dL (ref 70–99)
Sodium: 141 mEq/L (ref 135–145)

## 2012-11-28 MED ORDER — CLONAZEPAM 0.5 MG PO TABS: ORAL_TABLET | ORAL | Status: DC

## 2012-11-28 NOTE — Patient Instructions (Signed)
Call the results of the blood pressures over the weekend on Monday morning please.

## 2012-11-28 NOTE — Progress Notes (Signed)
Subjective: Patient checks his blood pressures at home this morning and they're very high. His diastolic was 117. He started the medications yesterday that we changed. Although he has a history of bipolar, he has not been extremely anxious. No other major acute problems. He does have a history of decreased kidney function.  Objective: Chest clear. Heart regular without murmurs. No carotid bruits. Blood pressures were done several times on him, with numbers from 137-150/87-94.  Assessment: Hypertension History of chronic kidney disease Labile hypertension Anxiety  Plan: Clonazepam 0.5 mg one every morning. Check renal function. Let me know Monday when his blood pressures are running.

## 2012-11-30 NOTE — Pre-Procedure Instructions (Signed)
[facility] Her[facility]  11/30/2012   Your procedure is scheduled on:  Friday, February 14th   Report to [facility] at 12:20 PM.  Call this number if you have problems the morning of surgery: [phone]   Remember:   Do not eat food or drink liquids after midnight Thursday.   Take these medicines the morning of surgery with A SIP OF WATER:  Klonopin, Prozac, Metoprolol, Pain Medication   Do not wear jewelry.  Do not wear lotions, powders, or cologne. You may NOT wear deodorant.  Men may shave face and neck.   Do not bring valuables to the hospital.  Contacts, dentures or bridgework may not be worn into surgery.   Leave suitcase in the car. After surgery it may be brought to your room.  For patients admitted to the hospital, checkout time is 11:00 AM the day of discharge.   Patients discharged the day of surgery will not be allowed to drive home.   Name and phone number of your driver:    Special Instructions: Shower using CHG 2 nights before surgery and the night before surgery.  If you shower the day of surgery use CHG.  Use special wash - you have one bottle of CHG for all showers.  You should use approximately 1/3 of the bottle for each shower.   Please read over the following fact sheets that you were given: Pain Booklet, Coughing and Deep Breathing, MRSA Information and Surgical Site Infection Prevention

## 2012-12-01 ENCOUNTER — Encounter (HOSPITAL_COMMUNITY): Payer: Self-pay

## 2012-12-01 ENCOUNTER — Encounter (HOSPITAL_COMMUNITY)
Admission: RE | Admit: 2012-12-01 | Discharge: 2012-12-01 | Disposition: A | Discharge status: Home / Self Care | Payer: Medicare Other | Source: Ambulatory Visit | Attending: Neurosurgery | Admitting: Neurosurgery

## 2012-12-01 HISTORY — DX: Sleep apnea, unspecified: G47.30

## 2012-12-01 LAB — CBC WITH DIFFERENTIAL/PLATELET
Eosinophils Absolute: 0.5 10*3/uL (ref 0.0–0.7)
Hemoglobin: 16.1 g/dL (ref 13.0–17.0)
Lymphs Abs: 2.6 10*3/uL (ref 0.7–4.0)
MCH: 30.9 pg (ref 26.0–34.0)
Monocytes Relative: 11 % (ref 3–12)
Neutro Abs: 3.8 10*3/uL (ref 1.7–7.7)
Neutrophils Relative %: 49 % (ref 43–77)
Platelets: 302 10*3/uL (ref 150–400)
RBC: 5.21 MIL/uL (ref 4.22–5.81)
WBC: 7.8 10*3/uL (ref 4.0–10.5)

## 2012-12-01 LAB — SURGICAL PCR SCREEN
MRSA, PCR: NEGATIVE
Staphylococcus aureus: NEGATIVE

## 2012-12-01 LAB — BASIC METABOLIC PANEL
Calcium: 9.9 mg/dL (ref 8.4–10.5)
GFR calc Af Amer: 36 mL/min — ABNORMAL LOW (ref >90)
GFR calc non Af Amer: 31 mL/min — ABNORMAL LOW (ref >90)
Glucose, Bld: 93 mg/dL (ref 70–99)
Potassium: 4.8 mEq/L (ref 3.5–5.1)
Sodium: 141 mEq/L (ref 135–145)

## 2012-12-02 NOTE — Consult Note (Signed)
Anesthesia Chart Review:  Patient is a 67 year old male scheduled for C5-6, C6-7 ACDF by Dr. Jordan Likes on 12/04/12.  He is s/p left T6-7 transpedicular microdiskectomy on 01/24/12.   Other history includes former smoker, kidney stones, diverticulitis s/p partial colectomy, HTN, OSA, Bipolar disorder type I. By current and previous labs, he appears to have renal insufficiency as well.  He also required a transfusion prior to his last surgery for anemia.  PCP is listed as Dr. Mila Palmer, but he was seen by Dr. Janace Hoard on 11/28/12 for BP control prior to surgery.  BP on 12/01/12 was 113/78.   CXR on 12/01/12 showed no acute cardiopulmonary findings.   EKG on 12/01/12 showed SB.   Labs showed a Cr of 2.06 (previous range of 1.60 - 1.96 since 11/2011).  (I have routed BMET results to Dr. Alwyn Ren and called Cr to Erie Noe at Dr. Edward Qualia office.)  CBC is essentially normal.  Will recheck BMET on arrival to ensure stability.  If Cr has not significantly increased then would anticipate he could proceed as planned with close post-operative follow-up of his renal function.  Shonna Chock, PA-C 12/02/12 1056

## 2012-12-03 MED ORDER — CEFAZOLIN SODIUM-DEXTROSE 2-3 GM-% IV SOLR
2.0000 g | INTRAVENOUS | Status: AC
  Administered 2012-12-04: 2 g via INTRAVENOUS
  Filled 2012-12-03: qty 50

## 2012-12-03 MED ORDER — DEXAMETHASONE SODIUM PHOSPHATE 10 MG/ML IJ SOLN
10.0000 mg | INTRAMUSCULAR | Status: AC
  Administered 2012-12-04: 10 mg via INTRAVENOUS
  Filled 2012-12-03: qty 1

## 2012-12-04 ENCOUNTER — Ambulatory Visit (HOSPITAL_COMMUNITY): Payer: Medicare Other

## 2012-12-04 ENCOUNTER — Ambulatory Visit (HOSPITAL_COMMUNITY): Payer: Medicare Other | Admitting: Vascular Surgery

## 2012-12-04 ENCOUNTER — Encounter (HOSPITAL_COMMUNITY)
Admission: RE | Disposition: A | Discharge status: Home / Self Care | Payer: Self-pay | Source: Ambulatory Visit | Attending: Neurosurgery

## 2012-12-04 ENCOUNTER — Encounter (HOSPITAL_COMMUNITY): Payer: Self-pay | Admitting: Vascular Surgery

## 2012-12-04 ENCOUNTER — Inpatient Hospital Stay (HOSPITAL_COMMUNITY)
Admission: RE | Admit: 2012-12-04 | Discharge: 2012-12-05 | DRG: 473 | Disposition: A | Discharge status: Home / Self Care | Payer: Medicare Other | Source: Ambulatory Visit | Attending: Neurosurgery | Admitting: Neurosurgery

## 2012-12-04 ENCOUNTER — Encounter (HOSPITAL_COMMUNITY): Payer: Self-pay | Admitting: *Deleted

## 2012-12-04 DIAGNOSIS — Z79899 Other long term (current) drug therapy: Secondary | ICD-10-CM

## 2012-12-04 DIAGNOSIS — Z01818 Encounter for other preprocedural examination: Secondary | ICD-10-CM

## 2012-12-04 DIAGNOSIS — Z01810 Encounter for preprocedural cardiovascular examination: Secondary | ICD-10-CM

## 2012-12-04 DIAGNOSIS — Z87891 Personal history of nicotine dependence: Secondary | ICD-10-CM

## 2012-12-04 DIAGNOSIS — F319 Bipolar disorder, unspecified: Secondary | ICD-10-CM | POA: Diagnosis present

## 2012-12-04 DIAGNOSIS — F411 Generalized anxiety disorder: Secondary | ICD-10-CM | POA: Diagnosis present

## 2012-12-04 DIAGNOSIS — G4730 Sleep apnea, unspecified: Secondary | ICD-10-CM | POA: Diagnosis present

## 2012-12-04 DIAGNOSIS — I10 Essential (primary) hypertension: Secondary | ICD-10-CM | POA: Diagnosis present

## 2012-12-04 DIAGNOSIS — Z7982 Long term (current) use of aspirin: Secondary | ICD-10-CM

## 2012-12-04 DIAGNOSIS — M47812 Spondylosis without myelopathy or radiculopathy, cervical region: Principal | ICD-10-CM | POA: Diagnosis present

## 2012-12-04 DIAGNOSIS — Z01812 Encounter for preprocedural laboratory examination: Secondary | ICD-10-CM

## 2012-12-04 DIAGNOSIS — M4722 Other spondylosis with radiculopathy, cervical region: Secondary | ICD-10-CM | POA: Diagnosis present

## 2012-12-04 HISTORY — PX: ANTERIOR CERVICAL DECOMP/DISCECTOMY FUSION: SHX1161

## 2012-12-04 LAB — BASIC METABOLIC PANEL
Chloride: 103 mEq/L (ref 96–112)
GFR calc Af Amer: 41 mL/min — ABNORMAL LOW (ref >90)
GFR calc non Af Amer: 35 mL/min — ABNORMAL LOW (ref >90)
Glucose, Bld: 89 mg/dL (ref 70–99)
Potassium: 4.4 mEq/L (ref 3.5–5.1)
Sodium: 140 mEq/L (ref 135–145)

## 2012-12-04 SURGERY — ANTERIOR CERVICAL DECOMPRESSION/DISCECTOMY FUSION 2 LEVELS
Anesthesia: General | Site: Neck | Wound class: Clean

## 2012-12-04 MED ORDER — SIMVASTATIN 20 MG PO TABS
20.0000 mg | ORAL_TABLET | Freq: Every day | ORAL | Status: DC
  Administered 2012-12-04: 20 mg via ORAL
  Filled 2012-12-04 (×2): qty 1

## 2012-12-04 MED ORDER — LAMOTRIGINE 200 MG PO TABS
200.0000 mg | ORAL_TABLET | Freq: Every day | ORAL | Status: DC
  Administered 2012-12-04: 200 mg via ORAL
  Filled 2012-12-04 (×2): qty 1

## 2012-12-04 MED ORDER — NEOSTIGMINE METHYLSULFATE 1 MG/ML IJ SOLN
INTRAMUSCULAR | Status: DC | PRN
  Administered 2012-12-04: 5 mg via INTRAVENOUS

## 2012-12-04 MED ORDER — OXYCODONE HCL 5 MG/5ML PO SOLN: 5.0000 mg | Freq: Once | ORAL | Status: DC | PRN

## 2012-12-04 MED ORDER — PHENOL 1.4 % MT LIQD: 1.0000 | OROMUCOSAL | Status: DC | PRN

## 2012-12-04 MED ORDER — HEMOSTATIC AGENTS (NO CHARGE) OPTIME
TOPICAL | Status: DC | PRN
  Administered 2012-12-04: 1 via TOPICAL

## 2012-12-04 MED ORDER — FENTANYL CITRATE 0.05 MG/ML IJ SOLN
INTRAMUSCULAR | Status: DC | PRN
  Administered 2012-12-04 (×5): 50 ug via INTRAVENOUS

## 2012-12-04 MED ORDER — LACTATED RINGERS IV SOLN
INTRAVENOUS | Status: DC | PRN
  Administered 2012-12-04 (×2): via INTRAVENOUS

## 2012-12-04 MED ORDER — HYDROCHLOROTHIAZIDE 12.5 MG PO CAPS
12.5000 mg | ORAL_CAPSULE | Freq: Every day | ORAL | Status: DC
  Filled 2012-12-04: qty 1

## 2012-12-04 MED ORDER — ACETAMINOPHEN 650 MG RE SUPP: 650.0000 mg | RECTAL | Status: DC | PRN

## 2012-12-04 MED ORDER — LIDOCAINE HCL 4 % MT SOLN
OROMUCOSAL | Status: DC | PRN
  Administered 2012-12-04: 4 mL via TOPICAL

## 2012-12-04 MED ORDER — METOPROLOL SUCCINATE ER 50 MG PO TB24
50.0000 mg | ORAL_TABLET | Freq: Every day | ORAL | Status: DC
  Administered 2012-12-04: 50 mg via ORAL
  Filled 2012-12-04 (×2): qty 1

## 2012-12-04 MED ORDER — FLUOXETINE HCL 20 MG PO CAPS
20.0000 mg | ORAL_CAPSULE | Freq: Every day | ORAL | Status: DC
  Filled 2012-12-04: qty 1

## 2012-12-04 MED ORDER — SENNA 8.6 MG PO TABS
1.0000 | ORAL_TABLET | Freq: Two times a day (BID) | ORAL | Status: DC
  Administered 2012-12-04: 8.6 mg via ORAL
  Filled 2012-12-04 (×3): qty 1

## 2012-12-04 MED ORDER — HYDROMORPHONE HCL PF 1 MG/ML IJ SOLN
0.2500 mg | INTRAMUSCULAR | Status: DC | PRN
  Administered 2012-12-04 (×2): 0.5 mg via INTRAVENOUS

## 2012-12-04 MED ORDER — SODIUM CHLORIDE 0.9 % IJ SOLN
3.0000 mL | Freq: Two times a day (BID) | INTRAMUSCULAR | Status: DC
  Administered 2012-12-04: 3 mL via INTRAVENOUS

## 2012-12-04 MED ORDER — MENTHOL 3 MG MT LOZG: 1.0000 | LOZENGE | OROMUCOSAL | Status: DC | PRN

## 2012-12-04 MED ORDER — ONDANSETRON HCL 4 MG/2ML IJ SOLN: 4.0000 mg | INTRAMUSCULAR | Status: DC | PRN

## 2012-12-04 MED ORDER — SUMATRIPTAN SUCCINATE 6 MG/0.5ML ~~LOC~~ SOLN
6.0000 mg | SUBCUTANEOUS | Status: DC | PRN
  Filled 2012-12-04: qty 0.5

## 2012-12-04 MED ORDER — CYCLOBENZAPRINE HCL 10 MG PO TABS
10.0000 mg | ORAL_TABLET | Freq: Three times a day (TID) | ORAL | Status: DC | PRN
  Administered 2012-12-04: 10 mg via ORAL
  Filled 2012-12-04: qty 1

## 2012-12-04 MED ORDER — ONDANSETRON HCL 4 MG/2ML IJ SOLN: 4.0000 mg | Freq: Once | INTRAMUSCULAR | Status: DC | PRN

## 2012-12-04 MED ORDER — CEFAZOLIN SODIUM 1-5 GM-% IV SOLN
1.0000 g | Freq: Three times a day (TID) | INTRAVENOUS | Status: AC
  Administered 2012-12-04 – 2012-12-05 (×2): 1 g via INTRAVENOUS
  Filled 2012-12-04 (×2): qty 50

## 2012-12-04 MED ORDER — GLYCOPYRROLATE 0.2 MG/ML IJ SOLN
INTRAMUSCULAR | Status: DC | PRN
  Administered 2012-12-04: .8 mg via INTRAVENOUS

## 2012-12-04 MED ORDER — OXYCODONE-ACETAMINOPHEN 5-325 MG PO TABS: 1.0000 | ORAL_TABLET | ORAL | Status: DC | PRN

## 2012-12-04 MED ORDER — DEXAMETHASONE SODIUM PHOSPHATE 10 MG/ML IJ SOLN: INTRAMUSCULAR | Status: DC | PRN

## 2012-12-04 MED ORDER — SODIUM CHLORIDE 0.9 % IR SOLN
Status: DC | PRN
  Administered 2012-12-04: 16:00:00

## 2012-12-04 MED ORDER — ACETAMINOPHEN 325 MG PO TABS: 650.0000 mg | ORAL_TABLET | ORAL | Status: DC | PRN

## 2012-12-04 MED ORDER — ZOLPIDEM TARTRATE 5 MG PO TABS
5.0000 mg | ORAL_TABLET | Freq: Every evening | ORAL | Status: DC | PRN
  Administered 2012-12-05: 5 mg via ORAL
  Filled 2012-12-04: qty 1

## 2012-12-04 MED ORDER — ALLOPURINOL 100 MG PO TABS
100.0000 mg | ORAL_TABLET | Freq: Every day | ORAL | Status: DC
  Administered 2012-12-04: 100 mg via ORAL
  Filled 2012-12-04 (×2): qty 1

## 2012-12-04 MED ORDER — HYDROCODONE-ACETAMINOPHEN 5-325 MG PO TABS
1.0000 | ORAL_TABLET | ORAL | Status: DC | PRN
  Administered 2012-12-04 – 2012-12-05 (×2): 2 via ORAL
  Filled 2012-12-04 (×2): qty 2

## 2012-12-04 MED ORDER — 0.9 % SODIUM CHLORIDE (POUR BTL) OPTIME
TOPICAL | Status: DC | PRN
  Administered 2012-12-04: 1000 mL

## 2012-12-04 MED ORDER — ARTIFICIAL TEARS OP OINT
TOPICAL_OINTMENT | OPHTHALMIC | Status: DC | PRN
  Administered 2012-12-04: 1 via OPHTHALMIC

## 2012-12-04 MED ORDER — LISINOPRIL-HYDROCHLOROTHIAZIDE 20-12.5 MG PO TABS
1.0000 | ORAL_TABLET | Freq: Every day | ORAL | Status: DC
Start: 2012-12-04 — End: 2012-12-04

## 2012-12-04 MED ORDER — SODIUM CHLORIDE 0.9 % IV SOLN
INTRAVENOUS | Status: AC
  Filled 2012-12-04: qty 500

## 2012-12-04 MED ORDER — HYDROMORPHONE HCL PF 1 MG/ML IJ SOLN
INTRAMUSCULAR | Status: AC
  Administered 2012-12-04: 0.5 mg via INTRAVENOUS
  Filled 2012-12-04: qty 1

## 2012-12-04 MED ORDER — LIDOCAINE HCL (CARDIAC) 20 MG/ML IV SOLN
INTRAVENOUS | Status: DC | PRN
  Administered 2012-12-04: 100 mg via INTRAVENOUS

## 2012-12-04 MED ORDER — THROMBIN 5000 UNITS EX KIT
PACK | CUTANEOUS | Status: DC | PRN
  Administered 2012-12-04 (×2): 5000 [IU] via TOPICAL

## 2012-12-04 MED ORDER — LISINOPRIL 20 MG PO TABS
20.0000 mg | ORAL_TABLET | Freq: Every day | ORAL | Status: DC
  Filled 2012-12-04: qty 1

## 2012-12-04 MED ORDER — ALUM & MAG HYDROXIDE-SIMETH 200-200-20 MG/5ML PO SUSP: 30.0000 mL | Freq: Four times a day (QID) | ORAL | Status: DC | PRN

## 2012-12-04 MED ORDER — MIDAZOLAM HCL 5 MG/5ML IJ SOLN
INTRAMUSCULAR | Status: DC | PRN
  Administered 2012-12-04: 2 mg via INTRAVENOUS

## 2012-12-04 MED ORDER — OXYCODONE HCL 5 MG PO TABS: 5.0000 mg | ORAL_TABLET | Freq: Once | ORAL | Status: DC | PRN

## 2012-12-04 MED ORDER — ROCURONIUM BROMIDE 100 MG/10ML IV SOLN
INTRAVENOUS | Status: DC | PRN
  Administered 2012-12-04: 20 mg via INTRAVENOUS
  Administered 2012-12-04: 30 mg via INTRAVENOUS

## 2012-12-04 MED ORDER — BACITRACIN 50000 UNITS IM SOLR
INTRAMUSCULAR | Status: AC
  Filled 2012-12-04: qty 1

## 2012-12-04 MED ORDER — SODIUM CHLORIDE 0.9 % IJ SOLN: 3.0000 mL | INTRAMUSCULAR | Status: DC | PRN

## 2012-12-04 MED ORDER — PROPOFOL 10 MG/ML IV BOLUS
INTRAVENOUS | Status: DC | PRN
  Administered 2012-12-04: 120 mg via INTRAVENOUS

## 2012-12-04 MED ORDER — LURASIDONE HCL 40 MG PO TABS
60.0000 mg | ORAL_TABLET | Freq: Every day | ORAL | Status: DC
  Filled 2012-12-04 (×2): qty 2

## 2012-12-04 MED ORDER — HYDROMORPHONE HCL PF 1 MG/ML IJ SOLN
0.5000 mg | INTRAMUSCULAR | Status: DC | PRN
  Administered 2012-12-05: 1 mg via INTRAVENOUS
  Filled 2012-12-04: qty 1

## 2012-12-04 MED ORDER — THROMBIN 5000 UNITS EX SOLR
OROMUCOSAL | Status: DC | PRN
  Administered 2012-12-04: 17:00:00 via TOPICAL

## 2012-12-04 MED ORDER — MEPERIDINE HCL 25 MG/ML IJ SOLN: 6.2500 mg | INTRAMUSCULAR | Status: DC | PRN

## 2012-12-04 MED ORDER — ONDANSETRON HCL 4 MG/2ML IJ SOLN
INTRAMUSCULAR | Status: DC | PRN
  Administered 2012-12-04: 4 mg via INTRAVENOUS

## 2012-12-04 MED ORDER — CLONAZEPAM 0.5 MG PO TABS: 0.5000 mg | ORAL_TABLET | Freq: Two times a day (BID) | ORAL | Status: DC

## 2012-12-04 SURGICAL SUPPLY — 57 items
ADH SKN CLS LQ APL DERMABOND (GAUZE/BANDAGES/DRESSINGS) ×1
APL SKNCLS STERI-STRIP NONHPOA (GAUZE/BANDAGES/DRESSINGS) ×1
BAG DECANTER FOR FLEXI CONT (MISCELLANEOUS) ×2 IMPLANT
BENZOIN TINCTURE PRP APPL 2/3 (GAUZE/BANDAGES/DRESSINGS) ×2 IMPLANT
BRUSH SCRUB EZ PLAIN DRY (MISCELLANEOUS) ×2 IMPLANT
BUR MATCHSTICK NEURO 3.0 LAGG (BURR) ×2 IMPLANT
CANISTER SUCTION 2500CC (MISCELLANEOUS) ×2 IMPLANT
CLOTH BEACON ORANGE TIMEOUT ST (SAFETY) ×2 IMPLANT
CONT SPEC 4OZ CLIKSEAL STRL BL (MISCELLANEOUS) ×2 IMPLANT
DERMABOND ADHESIVE PROPEN (GAUZE/BANDAGES/DRESSINGS) ×1
DERMABOND ADVANCED .7 DNX6 (GAUZE/BANDAGES/DRESSINGS) IMPLANT
DRAPE C-ARM 42X72 X-RAY (DRAPES) ×4 IMPLANT
DRAPE LAPAROTOMY 100X72 PEDS (DRAPES) ×2 IMPLANT
DRAPE MICROSCOPE ZEISS OPMI (DRAPES) ×2 IMPLANT
DRAPE POUCH INSTRU U-SHP 10X18 (DRAPES) ×2 IMPLANT
DRILL BIT (BIT) ×1 IMPLANT
ELECT COATED BLADE 2.86 ST (ELECTRODE) ×2 IMPLANT
ELECT REM PT RETURN 9FT ADLT (ELECTROSURGICAL) ×2
ELECTRODE REM PT RTRN 9FT ADLT (ELECTROSURGICAL) ×1 IMPLANT
GAUZE SPONGE 4X4 16PLY XRAY LF (GAUZE/BANDAGES/DRESSINGS) IMPLANT
GLOVE ECLIPSE 7.5 STRL STRAW (GLOVE) ×2 IMPLANT
GLOVE ECLIPSE 8.5 STRL (GLOVE) ×2 IMPLANT
GLOVE EXAM NITRILE LRG STRL (GLOVE) IMPLANT
GLOVE EXAM NITRILE MD LF STRL (GLOVE) IMPLANT
GLOVE EXAM NITRILE XL STR (GLOVE) IMPLANT
GLOVE EXAM NITRILE XS STR PU (GLOVE) IMPLANT
GLOVE INDICATOR 7.5 STRL GRN (GLOVE) ×2 IMPLANT
GOWN BRE IMP SLV AUR LG STRL (GOWN DISPOSABLE) ×1 IMPLANT
GOWN BRE IMP SLV AUR XL STRL (GOWN DISPOSABLE) ×3 IMPLANT
GOWN STRL REIN 2XL LVL4 (GOWN DISPOSABLE) IMPLANT
HEAD HALTER (SOFTGOODS) ×2 IMPLANT
HEMOSTAT SURGICEL 2X14 (HEMOSTASIS) IMPLANT
KIT BASIN OR (CUSTOM PROCEDURE TRAY) ×2 IMPLANT
KIT ROOM TURNOVER OR (KITS) ×2 IMPLANT
NDL SPNL 20GX3.5 QUINCKE YW (NEEDLE) ×1 IMPLANT
NEEDLE SPNL 20GX3.5 QUINCKE YW (NEEDLE) ×4 IMPLANT
NS IRRIG 1000ML POUR BTL (IV SOLUTION) ×2 IMPLANT
PACK LAMINECTOMY NEURO (CUSTOM PROCEDURE TRAY) ×2 IMPLANT
PAD ARMBOARD 7.5X6 YLW CONV (MISCELLANEOUS) ×6 IMPLANT
PLATE ELITE 42MM (Plate) ×1 IMPLANT
RUBBERBAND STERILE (MISCELLANEOUS) ×4 IMPLANT
SCREW ST 13X4XST VA NS SPNE (Screw) IMPLANT
SCREW ST VAR 4 ATL (Screw) ×12 IMPLANT
SPACER BONE CORNERSTONE 6X14 (Orthopedic Implant) ×1 IMPLANT
SPACER BONE CORNERSTONE 7X14 (Orthopedic Implant) ×1 IMPLANT
SPONGE GAUZE 4X4 12PLY (GAUZE/BANDAGES/DRESSINGS) ×1 IMPLANT
SPONGE INTESTINAL PEANUT (DISPOSABLE) ×2 IMPLANT
SPONGE SURGIFOAM ABS GEL SZ50 (HEMOSTASIS) ×2 IMPLANT
STRIP CLOSURE SKIN 1/2X4 (GAUZE/BANDAGES/DRESSINGS) ×2 IMPLANT
SUT PDS AB 5-0 P3 18 (SUTURE) ×2 IMPLANT
SUT VIC AB 3-0 SH 8-18 (SUTURE) ×2 IMPLANT
SYR 20ML ECCENTRIC (SYRINGE) ×2 IMPLANT
TAPE CLOTH 4X10 WHT NS (GAUZE/BANDAGES/DRESSINGS) ×2 IMPLANT
TAPE CLOTH SURG 4X10 WHT LF (GAUZE/BANDAGES/DRESSINGS) ×1 IMPLANT
TOWEL OR 17X24 6PK STRL BLUE (TOWEL DISPOSABLE) ×2 IMPLANT
TOWEL OR 17X26 10 PK STRL BLUE (TOWEL DISPOSABLE) ×2 IMPLANT
WATER STERILE IRR 1000ML POUR (IV SOLUTION) ×2 IMPLANT

## 2012-12-04 NOTE — Anesthesia Postprocedure Evaluation (Signed)
Anesthesia Post Note  Patient: Jordan [name]  Procedure(s) Performed: Procedure(s) (LRB): ANTERIOR CERVICAL DECOMPRESSION/DISCECTOMY FUSION 2 LEVELS (N/A)  Anesthesia type: General  Patient location: PACU  Post pain: Pain level controlled and Adequate analgesia  Post assessment: Post-op Vital signs reviewed, Patient's Cardiovascular Status Stable, Respiratory Function Stable, Patent Airway and Pain level controlled  Last Vitals:  Filed Vitals:   12/04/12 1815  BP: 137/85  Pulse: 78  Temp:   Resp: 12    Post vital signs: Reviewed and stable  Level of consciousness: awake, alert  and oriented  Complications: No apparent anesthesia complications

## 2012-12-04 NOTE — Op Note (Signed)
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  Asst. Surgeon: Phoebe Perch  Anesthesia: General  Indication: 67 year old male with neck and bilateral shoulder and upper extremity pain failing conservative management. Workup demonstrates evidence of marked spondylosis at C5-6 and C6-7 with neural foraminal stenosis. Patient presents now for two-level anterior cervical decompression infusion in hopes of improving his symptoms.  Operative note: After induction of anesthesia, patient positioned supine with neck slightly extended and held in place with halter traction. Patient's anterior cervical region was prepped and draped sterilely. Incision made overlying the C6 vertebral body this. This carried down sharply to the platysma. Is then divided vertically and dissection proceeded along the medial border of the sternocleidomastoid muscle and carotid sheath. Trachea and esophagus are mobilized track towards the left. Prevertebral fascia stripped off the anterior spinal column. Longus colli muscles elevated bilaterally using electrocautery. Deep self-retaining retractors placed intraoperative fluoroscopy is used levels were confirmed. The spaces at C5-6 and C6-7 were then incised with 15 blade in a rectangular fashion. Anterior ossifies were also removed. Wide discectomies and performed at both levels using pituitary rongeurs forward and backward angle Carlen curettes Kerrison rongeurs the high-speed drill. Almost the disc removed down to level posterior annulus. Marked and brought field these at the remainder of the discectomy. Remaining aspects of annulus and osteophytes removed using a high-speed drill down to the level posterior longitudinal  ligament. Posterior lateral ligament was elevated and resected in piecemeal fashion using Kerrison rongeurs. Underlying thecal sac was identified. Wide central decompression had been performed by undercutting the bodies of C5 and C6. Decompression then proceeded out each neural foramen. Wide anterior foraminotomies were then performed along the course exiting C6 nerve roots bilaterally. At this point a very thorough decompression had been achieved. There was no evidence of injury to the thecal sac and nerve roots. Procedure then repeated at C6-7 again without complication. Wounds an area with and bike solution. Gelfoam was placed topically for hemostasis which Ascent be good. Cornerstone allograft wedges and packed into place at both C5-6 and C6-7 and recessed roughly 1 mm from the anterior cortical margin. 42 mm Atlantis anterior cervical plate was then placed over the C56 and 7 levels. This is an attachment or fluoroscopic guidance and 13 were variable-angle screws 2 each in all 3 levels. All 6 screws given a final tightening down to be solidly within bone. Locking screws were engaged both levels. Final images revealed good position bone graft hardware proper level with normal alignment spine. Hemostasis was assured with bipolar cautery. Wounds an area that would have like solution. Gelfoam was placed topically for hemostasis. Wounds and closed in a typical fashion. Steri-Strips and sterile dressing were applied. There were no apparent complications. Patient tolerated the procedure well and returned to the recovery postop.

## 2012-12-04 NOTE — H&P (Signed)
Jordan [name] is an 67 y.o. male.   Chief Complaint: Neck pain HPI: 67 year old male with severe neck pain with radiation to both upper cavities failing conservative management. Workup demonstrates evidence of progressive spondylosis with significant disc space collapse and neural foraminal narrowing at both C5-6 and C6-7. Patient has failed conservative management. He presents now for C5-6 and C6-7 anterior cervical discectomy and fusion with allograft and her plating in hopes of improving his symptoms.  Past Medical History  Diagnosis Date  . Hypertension   . Neck pain   . Kidney stone   . Bipolar 1 disorder   . Diverticulitis   . Anxiety   . Arthritis   . Sleep apnea     Past Surgical History  Procedure Laterality Date  . Appendectomy      67 years old  . Partial colectomy    . Spine surgery  2013    Family History  Problem Relation Age of Onset  . Anesthesia problems Neg Hx    Social History:  reports that he quit smoking about 29 years ago. He has never used smokeless tobacco. He reports that he does not drink alcohol or use illicit drugs.  Allergies: No Known Allergies  Medications Prior to Admission  Medication Sig Dispense Refill  . allopurinol (ZYLOPRIM) 100 MG tablet Take 100 mg by mouth at bedtime.       Marland Kitchen aspirin 325 MG EC tablet Take 325 mg by mouth daily as needed. For pain      . clonazePAM (KLONOPIN) 0.5 MG tablet Take one in a.m. for anxiety  20 tablet  0  . cyanocobalamin 1000 MCG tablet Take 5,000 mcg by mouth daily. Vitamin B 12      . fish oil-omega-3 fatty acids 1000 MG capsule Take 1 g by mouth daily.      Marland Kitchen FLUoxetine (PROZAC) 20 MG capsule Take 20 mg by mouth daily.      . Garlic Oil (GARLIC 1500) 1500 MG CAPS Take 1 capsule by mouth daily.      Marland Kitchen glucosamine-chondroitin 500-400 MG tablet Take 3 tablets by mouth daily.      Marland Kitchen HYDROcodone-acetaminophen (NORCO) 10-325 MG per tablet Take 1 tablet by mouth every 6 (six) hours as needed.      .  lamoTRIgine (LAMICTAL) 200 MG tablet Take 200 mg by mouth daily.      Marland Kitchen lisinopril-hydrochlorothiazide (ZESTORETIC) 20-12.5 MG per tablet Take 1 tablet by mouth daily.  30 tablet  1  . lurasidone (LATUDA) 40 MG TABS Take 60 mg by mouth daily with breakfast.       . metoprolol succinate (TOPROL-XL) 50 MG 24 hr tablet Take 1 tablet (50 mg total) by mouth daily. Take with or immediately following a meal.  30 tablet  1  . simvastatin (ZOCOR) 20 MG tablet Take 20 mg by mouth every evening.      . SUMAtriptan (IMITREX) 6 MG/0.5ML SOLN injection Inject 0.5 mLs (6 mg total) into the skin every 2 (two) hours as needed for migraine or headache. F  0.5 mL  2  . zinc gluconate 50 MG tablet Take 50 mg by mouth daily.      Marland Kitchen zolpidem (AMBIEN) 10 MG tablet Take 10 mg by mouth at bedtime as needed. For sleep.        Results for orders placed during the hospital encounter of 12/04/12 (from the past 48 hour(s))  BASIC METABOLIC PANEL     Status: Abnormal   Collection  Time    12/04/12 12:26 PM      Result Value Range   Sodium 140  135 - 145 mEq/L   Potassium 4.4  3.5 - 5.1 mEq/L   Chloride 103  96 - 112 mEq/L   CO2 28  19 - 32 mEq/L   Glucose, Bld 89  70 - 99 mg/dL   BUN 24 (*) 6 - 23 mg/dL   Creatinine, Ser 1.61 (*) 0.50 - 1.35 mg/dL   Calcium 9.2  8.4 - 09.6 mg/dL   GFR calc non Af Amer 35 (*) >90 mL/min   GFR calc Af Amer 41 (*) >90 mL/min   Comment:            The eGFR has been calculated     using the CKD EPI equation.     This calculation has not been     validated in all clinical     situations.     eGFR's persistently     <90 mL/min signify     possible Chronic Kidney Disease.   No results found.  A comprehensive review of systems was negative.  Blood pressure 126/78, pulse 57, temperature 97.9 F (36.6 C), temperature source Oral, resp. rate 18, SpO2 97.00%.  Awake and alert. Oriented and appropriate. Cranial nerve function is intact. Motor and sensory function of the extremities  grossly intact. Neck diminished range of motion. Still with her her positive bilaterally. Deep tendon reflexes normal bilaterally. Head ears nose and throat unremarkable. Chest and abdomen benign. Extremities free from injury or deformity. Assessment/Plan C5-6 and C6-7 spondylosis with neural foraminal stenosis and radiculopathy. Plan C5-6 and C6-7 anterior cervical setting fusion with allograft and her plating in hopes of improving his symptoms. Risks and benefits have been explained. Patient wishes to proceed.  Stevey Stapleton A 12/04/2012, 2:32 PM

## 2012-12-04 NOTE — Brief Op Note (Signed)
12/04/2012  5:10 PM  PATIENT:  Jordan [name]  67 y.o. male  PRE-OPERATIVE DIAGNOSIS:  stenosis  POST-OPERATIVE DIAGNOSIS:  stenosis  PROCEDURE:  Procedure(s) with comments: ANTERIOR CERVICAL DECOMPRESSION/DISCECTOMY FUSION 2 LEVELS (N/A) - Cervical five-six, six-seven anterior cervical decompression/discectomy fusion with allograft and plating  SURGEON:  Surgeon(s) and Role:    * Temple Pacini, MD - Primary  PHYSICIAN ASSISTANT:   ASSISTANTS: Hirsch   ANESTHESIA:   general  EBL:  Total I/O In: 1250 [I.V.:1250] Out: 100 [Blood:100]  BLOOD ADMINISTERED:none  DRAINS: none   LOCAL MEDICATIONS USED:  NONE  SPECIMEN:  No Specimen  DISPOSITION OF SPECIMEN:  N/A  COUNTS:  YES  TOURNIQUET:  * No tourniquets in log *  DICTATION: .Dragon Dictation  PLAN OF CARE: Admit to inpatient   PATIENT DISPOSITION:  PACU - hemodynamically stable.   Delay start of Pharmacological VTE agent (>24hrs) due to surgical blood loss or risk of bleeding: yes

## 2012-12-04 NOTE — Anesthesia Preprocedure Evaluation (Signed)
Anesthesia Evaluation  Patient identified by MRN, date of birth, ID band Patient awake    Reviewed: Allergy & Precautions, H&P , NPO status , Patient's Chart, lab work & pertinent test results  Airway Mallampati: I TM Distance: >3 FB Neck ROM: Full    Dental   Pulmonary sleep apnea ,          Cardiovascular hypertension, Pt. on medications     Neuro/Psych    GI/Hepatic   Endo/Other    Renal/GU      Musculoskeletal   Abdominal   Peds  Hematology   Anesthesia Other Findings   Reproductive/Obstetrics                           Anesthesia Physical Anesthesia Plan  ASA: III  Anesthesia Plan: General   Post-op Pain Management:    Induction: Intravenous  Airway Management Planned: Oral ETT  Additional Equipment:   Intra-op Plan:   Post-operative Plan: Extubation in OR  Informed Consent: I have reviewed the patients History and Physical, chart, labs and discussed the procedure including the risks, benefits and alternatives for the proposed anesthesia with the patient or authorized representative who has indicated his/her understanding and acceptance.     Plan Discussed with: CRNA and Surgeon  Anesthesia Plan Comments:         Anesthesia Quick Evaluation

## 2012-12-04 NOTE — Anesthesia Procedure Notes (Signed)
Procedure Name: Intubation Date/Time: 12/04/2012 3:17 PM Performed by: Orvilla Fus A Pre-anesthesia Checklist: Patient identified, Timeout performed, Emergency Drugs available, Suction available and Patient being monitored Patient Re-evaluated:Patient Re-evaluated prior to inductionOxygen Delivery Method: Circle system utilized Preoxygenation: Pre-oxygenation with 100% oxygen Intubation Type: IV induction Ventilation: Mask ventilation without difficulty and Oral airway inserted - appropriate to patient size Laryngoscope Size: Mac and 4 Grade View: Grade II Tube type: Oral Tube size: 8.0 mm Number of attempts: 1 Airway Equipment and Method: Stylet and LTA kit utilized Placement Confirmation: breath sounds checked- equal and bilateral,  positive ETCO2 and ETT inserted through vocal cords under direct vision Secured at: 21 cm Tube secured with: Tape Dental Injury: Teeth and Oropharynx as per pre-operative assessment

## 2012-12-04 NOTE — Plan of Care (Signed)
Problem: Consults Goal: Diagnosis - Spinal Surgery Outcome: Completed/Met Date Met:  12/04/12 Cervical Spine Fusion     

## 2012-12-04 NOTE — Transfer of Care (Signed)
Immediate Anesthesia Transfer of Care Note  Patient: Jordan [name]  Procedure(s) Performed: Procedure(s) with comments: ANTERIOR CERVICAL DECOMPRESSION/DISCECTOMY FUSION 2 LEVELS (N/A) - Cervical five-six, six-seven anterior cervical decompression/discectomy fusion with allograft and plating  Patient Location: PACU  Anesthesia Type:General  Level of Consciousness: sedated  Airway & Oxygen Therapy: Patient Spontanous Breathing and Patient connected to face mask oxygen  Post-op Assessment: Report given to PACU RN and Post -op Vital signs reviewed and stable  Post vital signs: Reviewed and stable  Complications: No apparent anesthesia complications

## 2012-12-04 NOTE — Preoperative (Signed)
Beta Blockers   Reason not to administer Beta Blockers:Not Applicable 

## 2012-12-05 MED ORDER — CYCLOBENZAPRINE HCL 10 MG PO TABS: 10.0000 mg | ORAL_TABLET | Freq: Three times a day (TID) | ORAL | Status: DC | PRN

## 2012-12-05 MED ORDER — OXYCODONE-ACETAMINOPHEN 5-325 MG PO TABS: 1.0000 | ORAL_TABLET | ORAL | Status: DC | PRN

## 2012-12-05 NOTE — Discharge Summary (Signed)
Physician Discharge Summary  Patient ID: Jordan [name] MRN: [id number] DOB/AGE: 1946-10-01 67 y.o.  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   Voice - ok  Consults: None  Significant Diagnostic Studies: none  Treatments: surgery: C5-6 and C6-7 anterior cervical discectomy and fusion with allograft and anterior plating  Discharge Exam: Blood pressure 127/56, pulse 90, temperature 97.3 F (36.3 C), temperature source Oral, resp. rate 18, SpO2 95.00%. Wound:c/d/i  Disposition: home     Medication List    TAKE these medications       allopurinol 100 MG tablet  Commonly known as:  ZYLOPRIM  Take 100 mg by mouth at bedtime.     aspirin 325 MG EC tablet  Take 325 mg by mouth daily as needed. For pain     clonazePAM 0.5 MG tablet  Commonly known as:  KLONOPIN  Take one in a.m. for anxiety     cyanocobalamin 1000 MCG tablet  Take 5,000 mcg by mouth daily. Vitamin B 12     cyclobenzaprine 10 MG tablet  Commonly known as:  FLEXERIL  Take 1 tablet (10 mg total) by mouth 3 (three) times daily as needed for muscle spasms.     fish oil-omega-3 fatty acids 1000 MG capsule  Take 1 g by mouth daily.     FLUoxetine 20 MG capsule  Commonly known as:  PROZAC  Take 20 mg by mouth daily.     GARLIC 1500 1500 MG Caps  Generic drug:  Garlic Oil  Take 1 capsule by mouth daily.     glucosamine-chondroitin 500-400 MG tablet  Take 3 tablets by mouth daily.     HYDROcodone-acetaminophen 10-325 MG per tablet  Commonly known as:  NORCO  Take 1 tablet by mouth every 6 (six) hours as needed.     lamoTRIgine 200  MG tablet  Commonly known as:  LAMICTAL  Take 200 mg by mouth daily.     lisinopril-hydrochlorothiazide 20-12.5 MG per tablet  Commonly known as:  ZESTORETIC  Take 1 tablet by mouth daily.     lurasidone 40 MG Tabs  Commonly known as:  LATUDA  Take 60 mg by mouth daily with breakfast.     metoprolol succinate 50 MG 24 hr tablet  Commonly known as:  TOPROL-XL  Take 1 tablet (50 mg total) by mouth daily. Take with or immediately following a meal.     oxyCODONE-acetaminophen 5-325 MG per tablet  Commonly known as:  PERCOCET/ROXICET  Take 1-2 tablets by mouth every 4 (four) hours as needed for pain.     simvastatin 20 MG tablet  Commonly known as:  ZOCOR  Take 20 mg by mouth every evening.     SUMAtriptan 6 MG/0.5ML Soln injection  Commonly known as:  IMITREX  Inject 0.5 mLs (6 mg total) into the skin every 2 (two) hours as needed for migraine or headache. F     zinc gluconate 50 MG tablet  Take 50 mg by mouth daily.     zolpidem 10 MG tablet  Commonly known as:  AMBIEN  Take 10 mg by mouth at bedtime as needed. For sleep.         Signed: Colon Branch  R, MD 12/05/2012, 7:28 AM

## 2012-12-05 NOTE — Evaluation (Signed)
Occupational Therapy Evaluation and Discharge Patient Details Name: Jordan [name] MRN: [id number] DOB: 08-18-1946 Today's Date: 12/05/2012 Time: 4098-1191 OT Time Calculation (min): 12 min  OT Assessment / Plan / Recommendation Clinical Impression  This 67 yo male s/p anteior cervical surgery presents to acute OT with all education completed. Will sign off.    OT Assessment  Patient does not need any further OT services    Follow Up Recommendations  No OT follow up       Equipment Recommendations  None recommended by OT          Precautions / Restrictions Precautions Precautions: Cervical Precaution Booklet Issued: Yes (comment) Required Braces or Orthoses: Cervical Brace Cervical Brace: Soft collar;Applied in sitting position Restrictions Weight Bearing Restrictions: No   Pertinent Vitals/Pain 5/10 neck (repositioned)    ADL  Equipment Used:  (cervical collar) Transfers/Ambulation Related to ADLs: Independent with all ADL Comments: Pt is I/Mod I will all--wife can A him prn. Crosses leg over the other to get to his feet        Visit Information  Last OT Received On: 12/05/12 Assistance Needed: +1    Subjective Data  Subjective: I felt a little wobbly on the stairs (advised him to hold onto both rails--since he has 2 at home and to step to v. step over step)   Prior Functioning     Home Living Lives With: Spouse Available Help at Discharge: Family;Available 24 hours/day Type of Home: House Home Access: Stairs to enter [employer] of Steps: 1 Entrance Stairs-Rails: Can reach both Home Layout: One level Bathroom Shower/Tub: Walk-in shower;Door [employer] Toilet: Standard Home Adaptive Equipment: Hand-held shower hose Prior Function Level of Independence: Independent Able to Take Stairs?: Yes Driving: Yes Communication Communication: No difficulties Dominant Hand: Right            Cognition  Cognition Overall Cognitive Status:  Appears within functional limits for tasks assessed/performed Arousal/Alertness: Awake/alert Orientation Level: Appears intact for tasks assessed Behavior During Session: [facility] for tasks performed    Extremity/Trunk Assessment Right Upper Extremity Assessment RUE ROM/Strength/Tone: Within functional levels (advised to not raise arms over shoulder height) Left Upper Extremity Assessment LUE ROM/Strength/Tone: Within functional levels (advised to not raise arms over shoulder height)     Mobility Bed Mobility Details for Bed Mobility Assistance: Pt in recliner upon my arrival (has recliner at home). He also has an adjustable bed at home. Advised him whether bed is flat or raised to roll over on his side first then sit up (as handout given to him shows) Transfers Transfers: Sit to Stand;Stand to Sit Details for Transfer Assistance: Pt independent with all           End of Session OT - End of Session Equipment Utilized During Treatment: Cervical collar Activity Tolerance: Patient tolerated treatment well Patient left: in chair;with family/visitor present Nurse Communication:  (Eval completed, pt getting dressed)       Evette Georges [phone] 12/05/2012, 9:35 AM

## 2012-12-09 ENCOUNTER — Encounter (HOSPITAL_COMMUNITY): Payer: Self-pay | Admitting: Neurosurgery

## 2013-01-17 ENCOUNTER — Other Ambulatory Visit: Payer: Self-pay | Admitting: Family Medicine

## 2013-02-01 ENCOUNTER — Institutional Professional Consult (permissible substitution): Payer: Medicare Other | Admitting: Internal Medicine

## 2013-02-26 ENCOUNTER — Other Ambulatory Visit: Payer: Self-pay | Admitting: Nephrology

## 2013-03-01 ENCOUNTER — Ambulatory Visit
Admission: RE | Admit: 2013-03-01 | Discharge: 2013-03-01 | Disposition: A | Discharge status: Home / Self Care | Payer: Medicare Other | Source: Ambulatory Visit | Attending: Nephrology | Admitting: Nephrology

## 2013-03-05 ENCOUNTER — Other Ambulatory Visit (HOSPITAL_COMMUNITY): Payer: Self-pay | Admitting: Nephrology

## 2013-03-10 ENCOUNTER — Other Ambulatory Visit: Payer: Self-pay | Admitting: Radiology

## 2013-03-12 ENCOUNTER — Encounter (HOSPITAL_COMMUNITY): Payer: Self-pay | Admitting: Pharmacy Technician

## 2013-03-16 ENCOUNTER — Ambulatory Visit (HOSPITAL_COMMUNITY)
Admission: RE | Admit: 2013-03-16 | Discharge: 2013-03-16 | Disposition: A | Discharge status: Home / Self Care | Payer: Medicare Other | Source: Ambulatory Visit | Attending: Nephrology | Admitting: Nephrology

## 2013-03-16 ENCOUNTER — Encounter (HOSPITAL_COMMUNITY): Payer: Self-pay

## 2013-03-16 ENCOUNTER — Other Ambulatory Visit (HOSPITAL_COMMUNITY): Payer: Self-pay | Admitting: Nephrology

## 2013-03-16 DIAGNOSIS — I10 Essential (primary) hypertension: Secondary | ICD-10-CM | POA: Insufficient documentation

## 2013-03-16 DIAGNOSIS — Z9049 Acquired absence of other specified parts of digestive tract: Secondary | ICD-10-CM | POA: Insufficient documentation

## 2013-03-16 DIAGNOSIS — M129 Arthropathy, unspecified: Secondary | ICD-10-CM | POA: Insufficient documentation

## 2013-03-16 DIAGNOSIS — Z87891 Personal history of nicotine dependence: Secondary | ICD-10-CM | POA: Insufficient documentation

## 2013-03-16 DIAGNOSIS — G4730 Sleep apnea, unspecified: Secondary | ICD-10-CM | POA: Insufficient documentation

## 2013-03-16 DIAGNOSIS — F411 Generalized anxiety disorder: Secondary | ICD-10-CM | POA: Insufficient documentation

## 2013-03-16 DIAGNOSIS — F319 Bipolar disorder, unspecified: Secondary | ICD-10-CM | POA: Insufficient documentation

## 2013-03-16 DIAGNOSIS — K5730 Diverticulosis of large intestine without perforation or abscess without bleeding: Secondary | ICD-10-CM | POA: Insufficient documentation

## 2013-03-16 DIAGNOSIS — Z981 Arthrodesis status: Secondary | ICD-10-CM | POA: Insufficient documentation

## 2013-03-16 DIAGNOSIS — Z87442 Personal history of urinary calculi: Secondary | ICD-10-CM | POA: Insufficient documentation

## 2013-03-16 LAB — BASIC METABOLIC PANEL
BUN: 34 mg/dL — ABNORMAL HIGH (ref 6–23)
CO2: 16 mEq/L — ABNORMAL LOW (ref 19–32)
Chloride: 102 mEq/L (ref 96–112)
GFR calc Af Amer: 33 mL/min — ABNORMAL LOW (ref >90)
Glucose, Bld: 95 mg/dL (ref 70–99)
Potassium: 4 mEq/L (ref 3.5–5.1)

## 2013-03-16 LAB — CBC WITH DIFFERENTIAL/PLATELET
Basophils Relative: 1 % (ref 0–1)
HCT: 44.2 % (ref 39.0–52.0)
Hemoglobin: 15.9 g/dL (ref 13.0–17.0)
MCH: 31.5 pg (ref 26.0–34.0)
MCHC: 36 g/dL (ref 30.0–36.0)
MCV: 87.7 fL (ref 78.0–100.0)
Monocytes Absolute: 0.8 10*3/uL (ref 0.1–1.0)
Monocytes Relative: 9 % (ref 3–12)
Neutro Abs: 4.9 10*3/uL (ref 1.7–7.7)

## 2013-03-16 MED ORDER — FENTANYL CITRATE 0.05 MG/ML IJ SOLN
INTRAMUSCULAR | Status: AC | PRN
  Administered 2013-03-16 (×3): 25 ug via INTRAVENOUS

## 2013-03-16 MED ORDER — SODIUM CHLORIDE 0.9 % IV SOLN
Freq: Once | INTRAVENOUS | Status: AC
  Administered 2013-03-16: 07:00:00 via INTRAVENOUS

## 2013-03-16 MED ORDER — FENTANYL CITRATE 0.05 MG/ML IJ SOLN
INTRAMUSCULAR | Status: AC
  Filled 2013-03-16: qty 4

## 2013-03-16 MED ORDER — MIDAZOLAM HCL 2 MG/2ML IJ SOLN
INTRAMUSCULAR | Status: AC
  Filled 2013-03-16: qty 4

## 2013-03-16 MED ORDER — MIDAZOLAM HCL 2 MG/2ML IJ SOLN
INTRAMUSCULAR | Status: AC | PRN
  Administered 2013-03-16: 0.5 mg via INTRAVENOUS
  Administered 2013-03-16: 2 mg via INTRAVENOUS

## 2013-03-16 NOTE — Procedures (Signed)
Procedure:  Bilateral renal arteriography Contrast:  CO2 Access:  Right CFA, 5 Fr sheath Findings:  Widely patent bilateral single renal arteries.  No pressure gradient.

## 2013-03-16 NOTE — ED Notes (Signed)
Short stay notified for a bed

## 2013-03-16 NOTE — H&P (Signed)
Jordan [name] is an 67 y.o. male.   Chief Complaint: Labile hypertension Worsening renal function Followed by Dr Eliott Nine Scheduled now for CO2 renal arteriogram with possible angioplasty/stent placement HPI: HTN; recent C spine surgery; renal stones; Bipolar; sleep apnea  Past Medical History  Diagnosis Date  . Hypertension   . Neck pain   . Kidney stone   . Bipolar 1 disorder   . Diverticulitis   . Anxiety   . Arthritis   . Sleep apnea     Past Surgical History  Procedure Laterality Date  . Appendectomy      67 years old  . Partial colectomy    . Spine surgery  2013  . Anterior cervical decomp/discectomy fusion N/A 12/04/2012    Procedure: ANTERIOR CERVICAL DECOMPRESSION/DISCECTOMY FUSION 2 LEVELS;  Surgeon: Temple Pacini, MD;  Location: [facility];  Service: Neurosurgery;  Laterality: N/A;  Cervical five-six, six-seven anterior cervical decompression/discectomy fusion with allograft and plating    Family History  Problem Relation Age of Onset  . Anesthesia problems Neg Hx    Social History:  reports that he quit smoking about 29 years ago. He has never used smokeless tobacco. He reports that he does not drink alcohol or use illicit drugs.  Allergies: No Known Allergies   (Not in a hospital admission)  Results for orders placed during the hospital encounter of 03/16/13 (from the past 48 hour(s))  APTT     Status: None   Collection Time    03/16/13  7:01 AM      Result Value Range   aPTT 27  24 - 37 seconds  CBC WITH DIFFERENTIAL     Status: None   Collection Time    03/16/13  7:01 AM      Result Value Range   WBC 9.1  4.0 - 10.5 K/uL   RBC 5.04  4.22 - 5.81 MIL/uL   Hemoglobin 15.9  13.0 - 17.0 g/dL   HCT 21.3  08.6 - 57.8 %   MCV 87.7  78.0 - 100.0 fL   MCH 31.5  26.0 - 34.0 pg   MCHC 36.0  30.0 - 36.0 g/dL   RDW 46.9  62.9 - 52.8 %   Platelets 312  150 - 400 K/uL   Neutrophils Relative % 54  43 - 77 %   Neutro Abs 4.9  1.7 - 7.7 K/uL   Lymphocytes  Relative 33  12 - 46 %   Lymphs Abs 3.0  0.7 - 4.0 K/uL   Monocytes Relative 9  3 - 12 %   Monocytes Absolute 0.8  0.1 - 1.0 K/uL   Eosinophils Relative 4  0 - 5 %   Eosinophils Absolute 0.4  0.0 - 0.7 K/uL   Basophils Relative 1  0 - 1 %   Basophils Absolute 0.1  0.0 - 0.1 K/uL  PROTIME-INR     Status: None   Collection Time    03/16/13  7:01 AM      Result Value Range   Prothrombin Time 12.1  11.6 - 15.2 seconds   INR 0.90  0.00 - 1.49   No results found.  Review of Systems  Constitutional: Negative for fever.  HENT: Positive for neck pain.   Respiratory: Negative for cough and shortness of breath.   Cardiovascular: Negative for chest pain.  Gastrointestinal: Negative for nausea, vomiting and abdominal pain.  Musculoskeletal: Positive for joint pain.       Left hip pain  Neurological:  Negative for dizziness and headaches.    Blood pressure 127/86, pulse 67, temperature 97.4 F (36.3 C), temperature source Oral, resp. rate 20, height 5\' 6"  (1.676 m), weight 200 lb (90.719 kg), SpO2 96.00%. Physical Exam  Constitutional: He is oriented to person, place, and time. He appears well-developed and well-nourished.  Cardiovascular: Normal rate, regular rhythm and normal heart sounds.   No murmur heard. Respiratory: Effort normal and breath sounds normal. He has no wheezes.  GI: Soft. Bowel sounds are normal. There is no tenderness.  Musculoskeletal: Normal range of motion.  Neurological: He is alert and oriented to person, place, and time.  Skin: Skin is warm and dry.  Psychiatric: He has a normal mood and affect. His behavior is normal. Judgment and thought content normal.     Assessment/Plan HTN; worsening renal fxn Scheduled for CO2 renal arteriogram with poss pta/stent Pt aware of procedure benefits and risks and agreeable to proceed Consent signed and in chart  Abdurrahman Petersheim A 03/16/2013, 7:35 AM

## 2013-03-16 NOTE — H&P (Signed)
Agree 

## 2013-03-16 NOTE — Progress Notes (Signed)
Discharged home with wife; right groin unremarkable, denies pain/discomfort; fluids taken well; instructions given, pt and wife verbalize understanding

## 2013-03-16 NOTE — ED Notes (Signed)
Review groin precautions with pt 

## 2013-03-23 ENCOUNTER — Encounter: Payer: Self-pay | Admitting: Internal Medicine

## 2013-03-23 ENCOUNTER — Ambulatory Visit (INDEPENDENT_AMBULATORY_CARE_PROVIDER_SITE_OTHER): Payer: Medicare Other | Admitting: Internal Medicine

## 2013-03-23 VITALS — BP 130/92 | HR 75 | Ht 67.0 in | Wt 203.8 lb

## 2013-03-23 DIAGNOSIS — G4733 Obstructive sleep apnea (adult) (pediatric): Secondary | ICD-10-CM

## 2013-03-23 NOTE — Assessment & Plan Note (Signed)
It seems the key problem was that he was not prepared to followup  In 2008, but I can't be sure of details any longer. I reviewed the basic physiology of sleep apnea and we discussed treatment options. I offered opportunity to refit his mask the daytime sleep Center staff, and to do an auto titration for pressure check. He has not been using CPAP regularly and is not interested. I discussed oral appliances. His primary interest is that surgery options. Since he has 2 friends reported successful surgeries, I invited him to ask today saw. Otherwise be happy to make referral. He will call us back.

## 2013-03-23 NOTE — Patient Instructions (Addendum)
I will be happy to refer you to ENT for discussion of surgery as an option for sleep apnea. Please call us with the name(s) of the surgeons who helped your friends. Otherwise let us know and I will refer you to someone I know who does that work.  As we discussed, we can help you work on CPAP pressure and mask settings to try to make that more comfortable. I could also refer you to someone to discuss the oral appliance therapies for sleep apnea.  Please call as needed

## 2013-03-23 NOTE — Progress Notes (Signed)
03/23/13- 14 yoM former smoker, Former patient-sleep study in 2008(used [facility] when set up-no service on CPAP since). PCP Dr Jordan [name]. NPSG 12/03/06 AHI 54.4/ hr. Weighed 180 lbs. Was titrated to CPAP 12/ [facility]. He dropped away from followup here after about 3 visits. He says he was only seen once by [facility]. He tried a number of different CPAP masks the online but could never get comfortable. He is now specifically interested in surgery because 2 of his friends had successful surgical treatment for sleep apnea. He has no history of ENT surgery. Bedtime between 9 and 12 PM, sleep latency 2 hours, waking once or twice during the night before up at 7 AM. Weight is stable. Treated for hypertension and chronic kidney disease but denies cardiac history. 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 Son has sleep apnea.   Prior to Admission medications   Medication Sig Start Date End Date Taking? Authorizing Provider  aspirin 81 MG tablet Take 81 mg by mouth daily.   Yes Historical Provider, MD  cyclobenzaprine (FLEXERIL) 10 MG tablet Take 10 mg by mouth 3 (three) times daily as needed for muscle spasms.   Yes Historical Provider, MD  FLUoxetine (PROZAC) 20 MG capsule Take 20 mg by mouth daily.   Yes Historical Provider, MD  lamoTRIgine (LAMICTAL) 200 MG tablet Take 200 mg by mouth daily.   Yes Historical Provider, MD  lisinopril-hydrochlorothiazide (PRINZIDE,ZESTORETIC) 20-12.5 MG per tablet Take 1 tablet by mouth daily.   Yes Historical Provider, MD  simvastatin (ZOCOR) 20 MG tablet Take 20 mg by mouth every evening.   Yes Historical Provider, MD  SUMAtriptan (IMITREX) 6 MG/0.5ML SOLN injection Inject 0.5 mLs (6 mg total) into the skin every 2 (two) hours as needed for migraine or headache. F 11/22/12  Yes Jordan Filter, NP   Past Medical History  Diagnosis Date  . Hypertension   . Neck pain   . Kidney stone   . Bipolar 1 disorder   . Diverticulitis   . Anxiety   . Arthritis   . Sleep  apnea   . Chronic headaches    Past Surgical History  Procedure Laterality Date  . Appendectomy      67 years old  . Partial colectomy    . Spine surgery  2013  . Anterior cervical decomp/discectomy fusion N/A 12/04/2012    Procedure: ANTERIOR CERVICAL DECOMPRESSION/DISCECTOMY FUSION 2 LEVELS;  Surgeon: Temple Pacini, MD;  Location: [facility];  Service: Neurosurgery;  Laterality: N/A;  Cervical five-six, six-seven anterior cervical decompression/discectomy fusion with allograft and plating   Family History  Problem Relation Age of Onset  . Anesthesia problems Neg Hx   . Heart attack Father   . Liver cancer Paternal Grandfather     unsure of other cancers   . Diabetes Sister    History   Social History  . Marital Status: Married    Spouse Name: N/A    Number of Children: 0  . Years of Education: N/A   Occupational History  . Reitred Forensic psychologist    Social History Main Topics  . Smoking status: Former Smoker -- 1.50 packs/day for 20 years    Quit date: 10/22/1983  . Smokeless tobacco: Never Used  . Alcohol Use: No  . Drug Use: No  . Sexually Active: Not on file   Other Topics Concern  . Not on file   Social History Narrative  . No narrative on file   ROS-see HPI Constitutional:   No-  weight loss, night sweats, fevers, chills, +fatigue, lassitude. HEENT:   + headaches, No-difficulty swallowing, tooth/dental problems, sore throat,       No-  sneezing, itching, ear ache, nasal congestion, post nasal drip,  CV:  No-   chest pain, orthopnea, PND, swelling in lower extremities, anasarca,                                  dizziness, palpitations Resp: No-   shortness of breath with exertion or at rest.              No-   productive cough,  No non-productive cough,  No- coughing up of blood.              No-   change in color of mucus.  No- wheezing.   Skin: No-   rash or lesions. GI:  No-   heartburn, indigestion, abdominal pain, nausea, vomiting, diarrhea,                  change in bowel habits, loss of appetite GU: No-   dysuria, change in color of urine, no urgency or frequency.  No- flank pain. MS:  No-   joint pain or swelling.  No- decreased range of motion.  No- back pain. Neuro-     nothing unusual Psych:  No- change in mood or affect. No depression or anxiety.  No memory loss.  OBJ- Physical Exam General- Alert, Oriented, Affect-appropriate, Distress- none acute Skin- rash-none, lesions- none, excoriation- none Lymphadenopathy- none Head- atraumatic            Eyes- Gross vision intact, PERRLA, conjunctivae and secretions clear            Ears- Hearing, canals-normal            Nose- Clear, no-Septal dev, mucus, polyps, erosion, perforation             Throat- Mallampati III , mucosa clear , drainage- none, tonsils present Neck- flexible , trachea midline, no stridor , thyroid nl, carotid no bruit Chest - symmetrical excursion , unlabored           Heart/CV- RRR , no murmur , no gallop  , no rub, nl s1 s2                           - JVD- none , edema- none, stasis changes- none, varices- none           Lung- clear to P&A, wheeze- none, cough- none , dullness-none, rub- none           Chest wall-  Abd- tender-no, distended-no, bowel sounds-present, HSM- no Br/ Gen/ Rectal- Not done, not indicated Extrem- cyanosis- none, clubbing, none, atrophy- none, strength- nl Neuro- grossly intact to observation

## 2013-03-24 ENCOUNTER — Other Ambulatory Visit: Payer: Self-pay | Admitting: Neurosurgery

## 2013-03-24 DIAGNOSIS — M545 Low back pain: Secondary | ICD-10-CM

## 2013-03-28 ENCOUNTER — Other Ambulatory Visit: Payer: Self-pay | Admitting: Physician Assistant

## 2013-03-29 ENCOUNTER — Ambulatory Visit
Admission: RE | Admit: 2013-03-29 | Discharge: 2013-03-29 | Disposition: A | Discharge status: Home / Self Care | Payer: Medicare Other | Source: Ambulatory Visit | Attending: Neurosurgery | Admitting: Neurosurgery

## 2013-03-29 DIAGNOSIS — M545 Low back pain: Secondary | ICD-10-CM

## 2013-03-29 NOTE — Telephone Encounter (Signed)
Meds sent

## 2013-03-30 ENCOUNTER — Telehealth: Payer: Self-pay | Admitting: Internal Medicine

## 2013-03-30 DIAGNOSIS — G4733 Obstructive sleep apnea (adult) (pediatric): Secondary | ICD-10-CM

## 2013-03-30 NOTE — Telephone Encounter (Signed)
Ok to refer to [facility] ENT for discussion of surgery for sleep apnea.

## 2013-03-30 NOTE — Telephone Encounter (Signed)
Pt aware that order has been placed for ENT referral.

## 2013-03-30 NOTE — Telephone Encounter (Signed)
Per last visit OV notes:  Patient Instructions    I will be happy to refer you to ENT for discussion of surgery as an option for sleep apnea. Please call us with the name(s) of the surgeons who helped your friends. Otherwise let us know and I will refer you to someone I know who does that work.   Please advise who you would like to refer to. Thanks.

## 2013-04-13 ENCOUNTER — Encounter: Payer: Self-pay | Admitting: Internal Medicine

## 2013-05-26 ENCOUNTER — Other Ambulatory Visit: Payer: Self-pay | Admitting: Physician Assistant

## 2013-07-13 ENCOUNTER — Other Ambulatory Visit: Payer: Self-pay | Admitting: Physician Assistant

## 2013-08-02 ENCOUNTER — Other Ambulatory Visit: Payer: Self-pay | Admitting: Physician Assistant

## 2013-08-03 NOTE — Telephone Encounter (Signed)
Please call this patient.  We've advised him x 2 that he needs an OV for additional refills.  I also note that Dr. Mila Palmer is listed as his PCP, and if so, he should be getting refills from that office.  What's the plan?

## 2013-08-23 ENCOUNTER — Emergency Department (HOSPITAL_COMMUNITY)
Admission: EM | Admit: 2013-08-23 | Discharge: 2013-08-23 | Disposition: A | Discharge status: Home / Self Care | Payer: Medicare Other | Attending: Emergency Medicine | Admitting: Emergency Medicine

## 2013-08-23 ENCOUNTER — Encounter (HOSPITAL_COMMUNITY): Payer: Self-pay | Admitting: Emergency Medicine

## 2013-08-23 ENCOUNTER — Emergency Department (HOSPITAL_COMMUNITY): Payer: Medicare Other

## 2013-08-23 DIAGNOSIS — S0993XA Unspecified injury of face, initial encounter: Secondary | ICD-10-CM | POA: Insufficient documentation

## 2013-08-23 DIAGNOSIS — Z87891 Personal history of nicotine dependence: Secondary | ICD-10-CM | POA: Insufficient documentation

## 2013-08-23 DIAGNOSIS — W19XXXA Unspecified fall, initial encounter: Secondary | ICD-10-CM

## 2013-08-23 DIAGNOSIS — M129 Arthropathy, unspecified: Secondary | ICD-10-CM | POA: Insufficient documentation

## 2013-08-23 DIAGNOSIS — F411 Generalized anxiety disorder: Secondary | ICD-10-CM | POA: Insufficient documentation

## 2013-08-23 DIAGNOSIS — Z79899 Other long term (current) drug therapy: Secondary | ICD-10-CM | POA: Insufficient documentation

## 2013-08-23 DIAGNOSIS — Z8669 Personal history of other diseases of the nervous system and sense organs: Secondary | ICD-10-CM | POA: Insufficient documentation

## 2013-08-23 DIAGNOSIS — I129 Hypertensive chronic kidney disease with stage 1 through stage 4 chronic kidney disease, or unspecified chronic kidney disease: Secondary | ICD-10-CM | POA: Insufficient documentation

## 2013-08-23 DIAGNOSIS — Y9289 Other specified places as the place of occurrence of the external cause: Secondary | ICD-10-CM | POA: Insufficient documentation

## 2013-08-23 DIAGNOSIS — Z7982 Long term (current) use of aspirin: Secondary | ICD-10-CM | POA: Insufficient documentation

## 2013-08-23 DIAGNOSIS — Z8719 Personal history of other diseases of the digestive system: Secondary | ICD-10-CM | POA: Insufficient documentation

## 2013-08-23 DIAGNOSIS — Z87442 Personal history of urinary calculi: Secondary | ICD-10-CM | POA: Insufficient documentation

## 2013-08-23 DIAGNOSIS — R000 Tachycardia, unspecified: Secondary | ICD-10-CM | POA: Insufficient documentation

## 2013-08-23 DIAGNOSIS — W1809XA Striking against other object with subsequent fall, initial encounter: Secondary | ICD-10-CM | POA: Insufficient documentation

## 2013-08-23 DIAGNOSIS — K5900 Constipation, unspecified: Secondary | ICD-10-CM

## 2013-08-23 DIAGNOSIS — K644 Residual hemorrhoidal skin tags: Secondary | ICD-10-CM | POA: Insufficient documentation

## 2013-08-23 DIAGNOSIS — N189 Chronic kidney disease, unspecified: Secondary | ICD-10-CM | POA: Insufficient documentation

## 2013-08-23 DIAGNOSIS — Z9089 Acquired absence of other organs: Secondary | ICD-10-CM | POA: Insufficient documentation

## 2013-08-23 DIAGNOSIS — Y9301 Activity, walking, marching and hiking: Secondary | ICD-10-CM | POA: Insufficient documentation

## 2013-08-23 DIAGNOSIS — F319 Bipolar disorder, unspecified: Secondary | ICD-10-CM | POA: Insufficient documentation

## 2013-08-23 DIAGNOSIS — S8000XA Contusion of unspecified knee, initial encounter: Secondary | ICD-10-CM | POA: Insufficient documentation

## 2013-08-23 LAB — COMPREHENSIVE METABOLIC PANEL
ALT: 20 U/L (ref 0–53)
AST: 18 U/L (ref 0–37)
Albumin: 4.5 g/dL (ref 3.5–5.2)
Alkaline Phosphatase: 121 U/L — ABNORMAL HIGH (ref 39–117)
BUN: 19 mg/dL (ref 6–23)
CO2: 18 mEq/L — ABNORMAL LOW (ref 19–32)
Chloride: 102 mEq/L (ref 96–112)
Creatinine, Ser: 2.41 mg/dL — ABNORMAL HIGH (ref 0.50–1.35)
GFR calc Af Amer: 30 mL/min — ABNORMAL LOW (ref >90)
GFR calc non Af Amer: 26 mL/min — ABNORMAL LOW (ref >90)
Potassium: 4 mEq/L (ref 3.5–5.1)
Total Bilirubin: 0.5 mg/dL (ref 0.3–1.2)

## 2013-08-23 LAB — CBC WITH DIFFERENTIAL/PLATELET
Basophils Absolute: 0 10*3/uL (ref 0.0–0.1)
Basophils Relative: 0 % (ref 0–1)
HCT: 45.7 % (ref 39.0–52.0)
Hemoglobin: 15.6 g/dL (ref 13.0–17.0)
Lymphocytes Relative: 7 % — ABNORMAL LOW (ref 12–46)
Lymphs Abs: 0.9 10*3/uL (ref 0.7–4.0)
MCH: 29.9 pg (ref 26.0–34.0)
Monocytes Absolute: 1 10*3/uL (ref 0.1–1.0)
Monocytes Relative: 7 % (ref 3–12)
Neutro Abs: 12.4 10*3/uL — ABNORMAL HIGH (ref 1.7–7.7)
Neutrophils Relative %: 86 % — ABNORMAL HIGH (ref 43–77)
Platelets: 311 10*3/uL (ref 150–400)
RBC: 5.22 MIL/uL (ref 4.22–5.81)
WBC: 14.4 10*3/uL — ABNORMAL HIGH (ref 4.0–10.5)

## 2013-08-23 NOTE — ED Notes (Signed)
Pt c/o constipation since last pm, states strained all night now has "big blister" beside rectum

## 2013-08-23 NOTE — ED Provider Notes (Signed)
CSN: [id number]     Arrival date & time 08/23/13  2130 History   First MD Initiated Contact with Patient 08/23/13 1014     Chief Complaint  Patient presents with  . Constipation  . Hemorrhoids   (Consider location/radiation/quality/duration/timing/severity/associated sxs/prior Treatment) HPI Comments: Also pt states he is feeling much better after just having a bowel movement.  However his wife states he fell last night for no reason and states this is the second time he has fallen.  Pt states he does not know what happens he is just walking along and all of a sudden he starts falling and it happens so quickly he can't catch himself.  He denies LOC or syncope.  No CP or SOB.  No hx of alcohol abuse or mind altering meds.  Patient is a 67 y.o. male presenting with constipation. The history is provided by the patient.  Constipation Severity:  Severe Time since last bowel movement:  12 hours Timing:  Constant Progression since onset: had a bowel movement right before I saw the pt and states it is now much better. Chronicity:  New Context: not dehydration, not dietary changes, not medication and not narcotics   Stool description:  None produced Relieved by:  Defecation Worsened by:  Nothing tried Ineffective treatments:  None tried Associated symptoms: abdominal pain   Associated symptoms comment:  Rectal pain and a bulging of skin out of the rectum after straining and bright red blood on the toilet paper Risk factors: no change in medication, no hx of abdominal surgery, no recent antibiotic use, no recent illness, no recent surgery and no recent travel     Past Medical History  Diagnosis Date  . Hypertension   . Neck pain   . Kidney stone   . Bipolar 1 disorder   . Diverticulitis   . Anxiety   . Arthritis   . Sleep apnea   . Chronic headaches    Past Surgical History  Procedure Laterality Date  . Appendectomy      67 years old  . Partial colectomy    . Spine surgery  2013  .  Anterior cervical decomp/discectomy fusion N/A 12/04/2012    Procedure: ANTERIOR CERVICAL DECOMPRESSION/DISCECTOMY FUSION 2 LEVELS;  Surgeon: Temple Pacini, MD;  Location: [facility];  Service: Neurosurgery;  Laterality: N/A;  Cervical five-six, six-seven anterior cervical decompression/discectomy fusion with allograft and plating   Family History  Problem Relation Age of Onset  . Anesthesia problems Neg Hx   . Heart attack Father   . Liver cancer Paternal Grandfather     unsure of other cancers   . Diabetes Sister    History  Substance Use Topics  . Smoking status: Former Smoker -- 1.50 packs/day for 20 years    Quit date: 10/22/1983  . Smokeless tobacco: Never Used  . Alcohol Use: No    Review of Systems  Gastrointestinal: Positive for abdominal pain and constipation.  All other systems reviewed and are negative.    Allergies  Review of patient's allergies indicates no known allergies.  Home Medications   Current Outpatient Rx  Name  Route  Sig  Dispense  Refill  . aspirin 81 MG tablet   Oral   Take 81 mg by mouth daily.         . Aspirin-Salicylamide-Caffeine (BC HEADACHE POWDER PO)   Oral   Take 1 packet by mouth daily.         Marland Kitchen FLUoxetine (PROZAC) 20 MG capsule  Oral   Take 20 mg by mouth daily.         Marland Kitchen lamoTRIgine (LAMICTAL) 200 MG tablet   Oral   Take 200 mg by mouth daily.         Marland Kitchen lisinopril-hydrochlorothiazide (PRINZIDE,ZESTORETIC) 20-12.5 MG per tablet   Oral   Take 1 tablet by mouth daily.         . metoprolol succinate (TOPROL-XL) 50 MG 24 hr tablet      TAKE 1 TABLET BY MOUTH EVERY DAY **NEEDS OV**   15 tablet   0   . simvastatin (ZOCOR) 20 MG tablet   Oral   Take 20 mg by mouth every evening.          BP 152/101  Pulse 117  Temp(Src) 97.8 F (36.6 C) (Oral)  Resp 22  Ht 5\' 7"  (1.702 m)  Wt 205 lb (92.987 kg)  BMI 32.10 kg/m2  SpO2 94% Physical Exam  Nursing note and vitals reviewed. Constitutional: He is  oriented to person, place, and time. He appears well-developed and well-nourished. He appears distressed.  HENT:  Head: Normocephalic and atraumatic.    Mouth/Throat: Oropharynx is clear and moist.  Eyes: Conjunctivae and EOM are normal. Pupils are equal, round, and reactive to light.  Neck: Normal range of motion. Neck supple. No spinous process tenderness and no muscular tenderness present.  Cardiovascular: Regular rhythm and intact distal pulses.  Tachycardia present.   No murmur heard. Pulmonary/Chest: Effort normal and breath sounds normal. No respiratory distress. He has no wheezes. He has no rales.  Abdominal: Soft. He exhibits no distension. There is no tenderness. There is no rebound and no guarding.  Genitourinary: Rectum normal.  Musculoskeletal: Normal range of motion. He exhibits no edema and no tenderness.  Neurological: He is alert and oriented to person, place, and time. He has normal strength. No cranial nerve deficit or sensory deficit. He displays a negative Romberg sign. Coordination and gait normal.  Skin: Skin is warm and dry. No rash noted. No erythema.  Psychiatric: He has a normal mood and affect. His behavior is normal.    ED Course  Procedures (including critical care time) Labs Review Labs Reviewed  CBC WITH DIFFERENTIAL - Abnormal; Notable for the following:    WBC 14.4 (*)    Neutrophils Relative % 86 (*)    Neutro Abs 12.4 (*)    Lymphocytes Relative 7 (*)    All other components within normal limits  COMPREHENSIVE METABOLIC PANEL - Abnormal; Notable for the following:    CO2 18 (*)    Glucose, Bld 134 (*)    Creatinine, Ser 2.41 (*)    Calcium 10.7 (*)    Alkaline Phosphatase 121 (*)    GFR calc non Af Amer 26 (*)    GFR calc Af Amer 30 (*)    All other components within normal limits   Imaging Review Ct Head Wo Contrast  08/23/2013   CLINICAL DATA:  Pain post trauma  EXAM: CT HEAD WITHOUT CONTRAST  TECHNIQUE: Contiguous axial images were  obtained from the base of the skull through the vertex without intravenous contrast.  COMPARISON:  None.  FINDINGS: There is moderate diffuse atrophy. There is no mass, hemorrhage, extra-axial fluid collection, or midline shift. There is patchy small vessel disease in the centra semiovale bilaterally. Elsewhere gray-white compartments are normal. No acute infarct is demonstrable.  Bony calvarium appears intact. The mastoid air cells are clear. There is leftward deviation of the nasal  septum.  IMPRESSION: Atrophy with supratentorial small vessel disease. Leftward deviation of nasal septum. Study otherwise unremarkable. In particular, no intracranial mass or hemorrhage. No edema or acute appearing infarct.   Electronically Signed   By: Bretta Bang M.D.   On: 08/23/2013 11:14    EKG Interpretation   None       MDM  No diagnosis found.  Patient initially presented for constipation and protrusion from the rectum however when he arrived here he had a large bowel movement and upon exam there is no protrusion or obvious hemorrhoids. Feel most likely the patient had a prolapsed internal hemorrhoid that was caused from prolonged straining. However patient's significant other states that he has been falling recently for no reason. He denies any syncope but states last night he was walking and just fell and hit his face on the ground. he denies any alcohol use, change in medications, mind altering meds or history of diabetes. He does have fresh ecchymosis over the right periorbital region and bruising to the knee.  Normal gait and strength. Patient takes no anticoagulations at this time.  Will check CBC, cmp for electrolytes and head CT.  If all are wnl then pt will need to f/u with PCP and possibly neurology.  11:29 AM Labs without acute findings other than chronic renal insufficiency with creatinine of 2.4 but based on prior he is usually 2.2. He does appear to be mildly dehydrated but otherwise labs  are stable. CT showed atrophy and small vessel disease which could be the cause for his falls. We'll have him follow up with PCP and neurology   Gwyneth Sprout, MD 08/23/13 [phone]

## 2013-08-27 ENCOUNTER — Other Ambulatory Visit: Payer: Self-pay | Admitting: Physician Assistant

## 2015-05-05 ENCOUNTER — Encounter (HOSPITAL_COMMUNITY): Payer: Self-pay | Admitting: Emergency Medicine

## 2015-05-05 ENCOUNTER — Inpatient Hospital Stay (HOSPITAL_COMMUNITY)
Admission: EM | Admit: 2015-05-05 | Discharge: 2015-05-06 | DRG: 378 | Disposition: A | Discharge status: Home / Self Care | Payer: PPO | Attending: Internal Medicine | Admitting: Internal Medicine

## 2015-05-05 ENCOUNTER — Emergency Department (HOSPITAL_COMMUNITY): Payer: PPO

## 2015-05-05 DIAGNOSIS — N183 Chronic kidney disease, stage 3 unspecified: Secondary | ICD-10-CM | POA: Diagnosis present

## 2015-05-05 DIAGNOSIS — N202 Calculus of kidney with calculus of ureter: Secondary | ICD-10-CM | POA: Diagnosis present

## 2015-05-05 DIAGNOSIS — R1032 Left lower quadrant pain: Secondary | ICD-10-CM | POA: Diagnosis not present

## 2015-05-05 DIAGNOSIS — Z7982 Long term (current) use of aspirin: Secondary | ICD-10-CM | POA: Diagnosis not present

## 2015-05-05 DIAGNOSIS — K2960 Other gastritis without bleeding: Secondary | ICD-10-CM | POA: Diagnosis present

## 2015-05-05 DIAGNOSIS — K209 Esophagitis, unspecified without bleeding: Secondary | ICD-10-CM | POA: Diagnosis present

## 2015-05-05 DIAGNOSIS — I129 Hypertensive chronic kidney disease with stage 1 through stage 4 chronic kidney disease, or unspecified chronic kidney disease: Secondary | ICD-10-CM | POA: Diagnosis present

## 2015-05-05 DIAGNOSIS — I110 Hypertensive heart disease with heart failure: Secondary | ICD-10-CM | POA: Diagnosis present

## 2015-05-05 DIAGNOSIS — Z981 Arthrodesis status: Secondary | ICD-10-CM

## 2015-05-05 DIAGNOSIS — N200 Calculus of kidney: Secondary | ICD-10-CM | POA: Diagnosis present

## 2015-05-05 DIAGNOSIS — D649 Anemia, unspecified: Secondary | ICD-10-CM | POA: Diagnosis present

## 2015-05-05 DIAGNOSIS — F319 Bipolar disorder, unspecified: Secondary | ICD-10-CM | POA: Diagnosis present

## 2015-05-05 DIAGNOSIS — E785 Hyperlipidemia, unspecified: Secondary | ICD-10-CM | POA: Diagnosis present

## 2015-05-05 DIAGNOSIS — M5124 Other intervertebral disc displacement, thoracic region: Secondary | ICD-10-CM | POA: Diagnosis present

## 2015-05-05 DIAGNOSIS — G8929 Other chronic pain: Secondary | ICD-10-CM | POA: Diagnosis present

## 2015-05-05 DIAGNOSIS — K226 Gastro-esophageal laceration-hemorrhage syndrome: Secondary | ICD-10-CM | POA: Diagnosis present

## 2015-05-05 DIAGNOSIS — N179 Acute kidney failure, unspecified: Secondary | ICD-10-CM | POA: Diagnosis present

## 2015-05-05 DIAGNOSIS — Z79899 Other long term (current) drug therapy: Secondary | ICD-10-CM | POA: Diagnosis not present

## 2015-05-05 DIAGNOSIS — M1990 Unspecified osteoarthritis, unspecified site: Secondary | ICD-10-CM | POA: Diagnosis present

## 2015-05-05 DIAGNOSIS — K449 Diaphragmatic hernia without obstruction or gangrene: Secondary | ICD-10-CM | POA: Diagnosis present

## 2015-05-05 DIAGNOSIS — Z9049 Acquired absence of other specified parts of digestive tract: Secondary | ICD-10-CM | POA: Diagnosis present

## 2015-05-05 DIAGNOSIS — K922 Gastrointestinal hemorrhage, unspecified: Secondary | ICD-10-CM | POA: Diagnosis present

## 2015-05-05 DIAGNOSIS — M542 Cervicalgia: Secondary | ICD-10-CM | POA: Diagnosis present

## 2015-05-05 DIAGNOSIS — G4730 Sleep apnea, unspecified: Secondary | ICD-10-CM | POA: Diagnosis present

## 2015-05-05 DIAGNOSIS — R51 Headache: Secondary | ICD-10-CM | POA: Diagnosis present

## 2015-05-05 DIAGNOSIS — F419 Anxiety disorder, unspecified: Secondary | ICD-10-CM | POA: Diagnosis present

## 2015-05-05 DIAGNOSIS — Z87891 Personal history of nicotine dependence: Secondary | ICD-10-CM

## 2015-05-05 DIAGNOSIS — K2961 Other gastritis with bleeding: Secondary | ICD-10-CM | POA: Diagnosis present

## 2015-05-05 DIAGNOSIS — K2950 Unspecified chronic gastritis without bleeding: Secondary | ICD-10-CM | POA: Diagnosis present

## 2015-05-05 DIAGNOSIS — N189 Chronic kidney disease, unspecified: Secondary | ICD-10-CM

## 2015-05-05 DIAGNOSIS — I10 Essential (primary) hypertension: Secondary | ICD-10-CM

## 2015-05-05 HISTORY — DX: Diaphragmatic hernia without obstruction or gangrene: K44.9

## 2015-05-05 HISTORY — DX: Esophagitis, unspecified without bleeding: K20.90

## 2015-05-05 HISTORY — DX: Other gastritis without bleeding: K29.60

## 2015-05-05 HISTORY — DX: Unspecified chronic gastritis without bleeding: K29.50

## 2015-05-05 HISTORY — DX: Esophagitis, unspecified: K20.9

## 2015-05-05 HISTORY — DX: Chronic kidney disease, stage 3 (moderate): N18.3

## 2015-05-05 HISTORY — DX: Atherosclerotic heart disease of native coronary artery without angina pectoris: I25.10

## 2015-05-05 HISTORY — DX: Unspecified atherosclerosis: I70.90

## 2015-05-05 HISTORY — DX: Chronic kidney disease, stage 3 unspecified: N18.30

## 2015-05-05 HISTORY — DX: Unilateral inguinal hernia, without obstruction or gangrene, not specified as recurrent: K40.90

## 2015-05-05 LAB — COMPREHENSIVE METABOLIC PANEL
ALT: 21 U/L (ref 17–63)
AST: 24 U/L (ref 15–41)
Albumin: 4.6 g/dL (ref 3.5–5.0)
Alkaline Phosphatase: 85 U/L (ref 38–126)
Anion gap: 9 (ref 5–15)
BUN: 45 mg/dL — ABNORMAL HIGH (ref 6–20)
CO2: 24 mmol/L (ref 22–32)
CREATININE: 2.3 mg/dL — AB (ref 0.61–1.24)
Calcium: 11 mg/dL — ABNORMAL HIGH (ref 8.9–10.3)
Chloride: 107 mmol/L (ref 101–111)
GFR calc non Af Amer: 27 mL/min — ABNORMAL LOW (ref >60)
GFR, EST AFRICAN AMERICAN: 31 mL/min — AB (ref >60)
GLUCOSE: 107 mg/dL — AB (ref 65–99)
Potassium: 3.9 mmol/L (ref 3.5–5.1)
Sodium: 140 mmol/L (ref 135–145)
TOTAL PROTEIN: 7.6 g/dL (ref 6.5–8.1)
Total Bilirubin: 0.6 mg/dL (ref 0.3–1.2)

## 2015-05-05 LAB — CBC
HCT: 39 % (ref 39.0–52.0)
HEMOGLOBIN: 13.4 g/dL (ref 13.0–17.0)
MCH: 30.2 pg (ref 26.0–34.0)
MCHC: 34.4 g/dL (ref 30.0–36.0)
MCV: 88 fL (ref 78.0–100.0)
PLATELETS: 312 10*3/uL (ref 150–400)
RBC: 4.43 MIL/uL (ref 4.22–5.81)
RDW: 13.8 % (ref 11.5–15.5)
WBC: 12.2 10*3/uL — ABNORMAL HIGH (ref 4.0–10.5)

## 2015-05-05 LAB — ABO/RH: ABO/RH(D): B POS

## 2015-05-05 LAB — HEMATOCRIT: HEMATOCRIT: 36.9 % — AB (ref 39.0–52.0)

## 2015-05-05 LAB — TYPE AND SCREEN
ABO/RH(D): B POS
Antibody Screen: NEGATIVE

## 2015-05-05 LAB — HEMOGLOBIN: Hemoglobin: 12 g/dL — ABNORMAL LOW (ref 13.0–17.0)

## 2015-05-05 LAB — POC OCCULT BLOOD, ED: Fecal Occult Bld: POSITIVE — AB

## 2015-05-05 MED ORDER — ACETAMINOPHEN 650 MG RE SUPP: 650.0000 mg | Freq: Four times a day (QID) | RECTAL | Status: DC | PRN

## 2015-05-05 MED ORDER — SIMVASTATIN 20 MG PO TABS
20.0000 mg | ORAL_TABLET | Freq: Every evening | ORAL | Status: DC
  Administered 2015-05-05: 20 mg via ORAL
  Filled 2015-05-05 (×2): qty 1

## 2015-05-05 MED ORDER — SODIUM CHLORIDE 0.9 % IV SOLN
INTRAVENOUS | Status: DC
  Administered 2015-05-05: 16:00:00 via INTRAVENOUS

## 2015-05-05 MED ORDER — ALUM & MAG HYDROXIDE-SIMETH 200-200-20 MG/5ML PO SUSP
30.0000 mL | Freq: Four times a day (QID) | ORAL | Status: DC | PRN
Start: 2015-05-05 — End: 2015-05-06

## 2015-05-05 MED ORDER — ONDANSETRON HCL 4 MG/2ML IJ SOLN: 4.0000 mg | Freq: Four times a day (QID) | INTRAMUSCULAR | Status: DC | PRN

## 2015-05-05 MED ORDER — PANTOPRAZOLE SODIUM 40 MG IV SOLR
40.0000 mg | Freq: Two times a day (BID) | INTRAVENOUS | Status: DC
  Administered 2015-05-05: 40 mg via INTRAVENOUS
  Filled 2015-05-05 (×3): qty 40

## 2015-05-05 MED ORDER — SODIUM CHLORIDE 0.9 % IV SOLN
INTRAVENOUS | Status: DC
  Administered 2015-05-05 – 2015-05-06 (×2): via INTRAVENOUS

## 2015-05-05 MED ORDER — ONDANSETRON HCL 4 MG PO TABS: 4.0000 mg | ORAL_TABLET | Freq: Four times a day (QID) | ORAL | Status: DC | PRN

## 2015-05-05 MED ORDER — FLUOXETINE HCL 20 MG PO CAPS
20.0000 mg | ORAL_CAPSULE | Freq: Every day | ORAL | Status: DC
  Administered 2015-05-05 – 2015-05-06 (×2): 20 mg via ORAL
  Filled 2015-05-05 (×2): qty 1

## 2015-05-05 MED ORDER — SODIUM CHLORIDE 0.9 % IV BOLUS (SEPSIS)
500.0000 mL | Freq: Once | INTRAVENOUS | Status: AC
  Administered 2015-05-05: 500 mL via INTRAVENOUS

## 2015-05-05 MED ORDER — METOPROLOL SUCCINATE ER 50 MG PO TB24
50.0000 mg | ORAL_TABLET | Freq: Every day | ORAL | Status: DC
  Administered 2015-05-05 – 2015-05-06 (×2): 50 mg via ORAL
  Filled 2015-05-05 (×2): qty 1

## 2015-05-05 MED ORDER — ACETAMINOPHEN 325 MG PO TABS: 650.0000 mg | ORAL_TABLET | Freq: Four times a day (QID) | ORAL | Status: DC | PRN

## 2015-05-05 MED ORDER — LAMOTRIGINE 200 MG PO TABS
200.0000 mg | ORAL_TABLET | Freq: Every day | ORAL | Status: DC
  Administered 2015-05-05 – 2015-05-06 (×2): 200 mg via ORAL
  Filled 2015-05-05 (×2): qty 1

## 2015-05-05 MED ORDER — OXYCODONE HCL 5 MG PO TABS
5.0000 mg | ORAL_TABLET | ORAL | Status: DC | PRN
  Administered 2015-05-05 – 2015-05-06 (×3): 5 mg via ORAL
  Filled 2015-05-05 (×3): qty 1

## 2015-05-05 NOTE — ED Notes (Signed)
Okay to bring to floor at [id number]

## 2015-05-05 NOTE — Progress Notes (Signed)
Utilization Review completed.  Santa Abdelrahman RN CM  

## 2015-05-05 NOTE — Consult Note (Signed)
Reason for Consult: Upper GI bleeding Referring Physician: Hospital team  Jordan [name] is an 69 y.o. male.  HPI: Patient seen and examined and discussed with the hospital team and our office computer chart and his hospital computer chart was reviewed and the patient was last seen by me in May 2013 for colonoscopy and endoscopy which did show some diverticula and small polyps as well as some significant gastritis and he has not had any GI complaints until some black stools yesterday and threw up some blood today and has been on increased Goody's and BCs for neck pain without any stomach medicine and he does not take any extra aspirin or other non-steroidal's and he has no other complaints and his family history is negative  Past Medical History  Diagnosis Date  . Hypertension   . Neck pain   . Kidney stone   . Bipolar 1 disorder   . Diverticulitis   . Anxiety   . Arthritis   . Sleep apnea   . Chronic headaches     Past Surgical History  Procedure Laterality Date  . Appendectomy      69 years old  . Partial colectomy    . Spine surgery  2013  . Anterior cervical decomp/discectomy fusion N/A 12/04/2012    Procedure: ANTERIOR CERVICAL DECOMPRESSION/DISCECTOMY FUSION 2 LEVELS;  Surgeon: Charlie Pitter, MD;  Location: [facility];  Service: Neurosurgery;  Laterality: N/A;  Cervical five-six, six-seven anterior cervical decompression/discectomy fusion with allograft and plating    Family History  Problem Relation Age of Onset  . Anesthesia problems Neg Hx   . Heart attack Father   . Liver cancer Paternal Grandfather     unsure of other cancers   . Diabetes Sister     Social History:  reports that he quit smoking about 31 years ago. He has never used smokeless tobacco. He reports that he does not drink alcohol or use illicit drugs.  Allergies: No Known Allergies  Medications: I have reviewed the patient's current medications.  Results for orders placed or performed during the  hospital encounter of 05/05/15 (from the past 48 hour(s))  ABO/Rh     Status: None   Collection Time: 05/05/15 12:36 PM  Result Value Ref Range   ABO/RH(D) B POS   Comprehensive metabolic panel     Status: Abnormal   Collection Time: 05/05/15 12:47 PM  Result Value Ref Range   Sodium 140 135 - 145 mmol/L   Potassium 3.9 3.5 - 5.1 mmol/L   Chloride 107 101 - 111 mmol/L   CO2 24 22 - 32 mmol/L   Glucose, Bld 107 (H) 65 - 99 mg/dL   BUN 45 (H) 6 - 20 mg/dL   Creatinine, Ser 2.30 (H) 0.61 - 1.24 mg/dL   Calcium 11.0 (H) 8.9 - 10.3 mg/dL   Total Protein 7.6 6.5 - 8.1 g/dL   Albumin 4.6 3.5 - 5.0 g/dL   AST 24 15 - 41 U/L   ALT 21 17 - 63 U/L   Alkaline Phosphatase 85 38 - 126 U/L   Total Bilirubin 0.6 0.3 - 1.2 mg/dL   GFR calc non Af Amer 27 (L) >60 mL/min   GFR calc Af Amer 31 (L) >60 mL/min    Comment: (NOTE) The eGFR has been calculated using the CKD EPI equation. This calculation has not been validated in all clinical situations. eGFR's persistently <60 mL/min signify possible Chronic Kidney Disease.    Anion gap 9 5 -  15  CBC     Status: Abnormal   Collection Time: 05/05/15 12:47 PM  Result Value Ref Range   WBC 12.2 (H) 4.0 - 10.5 K/uL   RBC 4.43 4.22 - 5.81 MIL/uL   Hemoglobin 13.4 13.0 - 17.0 g/dL   HCT 39.0 39.0 - 52.0 %   MCV 88.0 78.0 - 100.0 fL   MCH 30.2 26.0 - 34.0 pg   MCHC 34.4 30.0 - 36.0 g/dL   RDW 13.8 11.5 - 15.5 %   Platelets 312 150 - 400 K/uL  Type and screen     Status: None   Collection Time: 05/05/15 12:47 PM  Result Value Ref Range   ABO/RH(D) B POS    Antibody Screen NEG    Sample Expiration 05/08/2015   POC occult blood, ED     Status: Abnormal   Collection Time: 05/05/15  2:31 PM  Result Value Ref Range   Fecal Occult Bld POSITIVE (A) NEGATIVE    Ct Renal Stone Study  05/05/2015   CLINICAL DATA:  Mid abdominal pain. LEFT lower quadrant pain since yesterday. Nausea and vomiting with blood. Dark stools.  EXAM: CT ABDOMEN AND PELVIS  WITHOUT CONTRAST  TECHNIQUE: Multidetector CT imaging of the abdomen and pelvis was performed following the standard protocol without IV contrast.  COMPARISON:  12/01/2011.  MRI 03/29/2013.  FINDINGS: Musculoskeletal: No aggressive osseous lesions. L4-L5 predominant lumbar spondylosis. Dextroconvex curve of the lumbar spine with 1 cm rightward slip of L4 on L5.  Lung Bases: Dependent atelectasis.  Liver: Unenhanced CT was performed per clinician order. Lack of IV contrast limits sensitivity and specificity, especially for evaluation of abdominal/pelvic solid viscera. Grossly normal.  Spleen:  Normal.  Gallbladder:  Contracted.  No calcified stones.  Common bile duct:  Normal.  Pancreas:  Normal.  Adrenal glands:  Normal bilaterally.  Kidneys: Kidneys are at the lower end of normal size bilaterally. Nonobstructing bilateral renal collecting system calculi. The LEFT ureter demonstrates a 4 mm calculus just proximal to the LEFT UVJ. No hydronephrosis. RIGHT ureter is within normal limits.  Stomach:  Small hiatal hernia.  No inflammatory changes of stomach.  Small bowel:  Normal.  Colon: No inflammatory changes or obstruction. The appendix is not identified. Colonic diverticulosis without diverticulitis. Staple line in the sigmoid colon compatible with partial sigmoidectomy.  Pelvic Genitourinary: Prostate calcifications. Urinary bladder appears normal.  Peritoneum: No free fluid or free air.  Vascular/lymphatic: Coronary artery atherosclerosis is present. If office based assessment of coronary risk factors has not been performed, it is now recommended. Tortuous atherosclerotic abdominal aorta. Iliofemoral atherosclerosis.  Body Wall: Fat containing LEFT inguinal hernia.  IMPRESSION: 1. 4 mm distal LEFT ureteral stone without hydronephrosis. 2. Multiple nonobstructing residual collecting system calculi. 3. Partial sigmoidectomy. 4. Atherosclerosis and coronary artery disease.   Electronically Signed   By: Dereck Ligas M.D.   On: 05/05/2015 14:05    ROS pertinent for the neck pain only although he does say his memory is not as good Blood pressure 129/72, pulse 116, temperature 98.1 F (36.7 C), temperature source Oral, resp. rate 18, SpO2 100 %. Physical Exam vital signs stable afebrile no acute distress although he does look a little pale lungs clear regular rate and rhythm abdomen is soft nontender good bowel sounds CT and labs reviewed BUN increased over baseline  Assessment/Plan: Upper GI bleeding in patient on Goody's and BCs Plan: We rediscussed endoscopy and will proceed when necessary or tomorrow morning with further workup and  plans pending those findings  Fletcher Ostermiller E 05/05/2015, 4:34 PM

## 2015-05-05 NOTE — ED Provider Notes (Signed)
CSN: [id number]     Arrival date & time 05/05/15  1223 History   First MD Initiated Contact with Patient 05/05/15 1256     Chief Complaint  Patient presents with  . GI Bleeding     (Consider location/radiation/quality/duration/timing/severity/associated sxs/prior Treatment) HPI Comments: 69 year old male with history of anemia, diverticulitis, high blood pressure, required blood transfusion years ago, diverticular surgery history the past presents with blood in the stools. Patient has been taking aspirin for neck pain for the past few days and then had episode of darker blood in the stool diarrhea yesterday as well as today. Patient had episode of hematemesis prior to arrival. No history of known ulcers. No syncope. No chest pain. General fatigue. Mild left-sided and epigastric abdominal no recent antibody. discomfort. No current GI doctor.  The history is provided by the patient.    Past Medical History  Diagnosis Date  . Hypertension   . Neck pain   . Kidney stone   . Bipolar 1 disorder   . Diverticulitis   . Anxiety   . Arthritis   . Sleep apnea   . Chronic headaches    Past Surgical History  Procedure Laterality Date  . Appendectomy      69 years old  . Partial colectomy    . Spine surgery  2013  . Anterior cervical decomp/discectomy fusion N/A 12/04/2012    Procedure: ANTERIOR CERVICAL DECOMPRESSION/DISCECTOMY FUSION 2 LEVELS;  Surgeon: Temple Pacini, MD;  Location: [facility];  Service: Neurosurgery;  Laterality: N/A;  Cervical five-six, six-seven anterior cervical decompression/discectomy fusion with allograft and plating   Family History  Problem Relation Age of Onset  . Anesthesia problems Neg Hx   . Heart attack Father   . Liver cancer Paternal Grandfather     unsure of other cancers   . Diabetes Sister    History  Substance Use Topics  . Smoking status: Former Smoker -- 1.50 packs/day for 20 years    Quit date: 10/22/1983  . Smokeless tobacco: Never Used  .  Alcohol Use: No    Review of Systems  Constitutional: Positive for fatigue. Negative for fever and chills.  HENT: Negative for congestion.   Eyes: Negative for visual disturbance.  Respiratory: Negative for shortness of breath.   Cardiovascular: Negative for chest pain.  Gastrointestinal: Positive for nausea, vomiting, abdominal pain, diarrhea and blood in stool.  Genitourinary: Negative for dysuria and flank pain.  Musculoskeletal: Negative for back pain, neck pain and neck stiffness.  Skin: Negative for rash.  Neurological: Positive for light-headedness. Negative for headaches.      Allergies  Review of patient's allergies indicates no known allergies.  Home Medications   Prior to Admission medications   Medication Sig Start Date End Date Taking? Authorizing Provider  Aspirin-Salicylamide-Caffeine (BC HEADACHE POWDER PO) Take 1 packet by mouth 4 (four) times daily as needed (headache an neck pain).    Yes Historical Provider, MD  FLUoxetine (PROZAC) 20 MG capsule Take 20 mg by mouth daily.   Yes Historical Provider, MD  Glucosamine HCl (GLUCOSAMINE PO) Take 1 tablet by mouth daily.   Yes Historical Provider, MD  lamoTRIgine (LAMICTAL) 200 MG tablet Take 200 mg by mouth daily.   Yes Historical Provider, MD  lisinopril-hydrochlorothiazide (PRINZIDE,ZESTORETIC) 20-12.5 MG per tablet Take 1 tablet by mouth daily.   Yes Historical Provider, MD  metoprolol succinate (TOPROL-XL) 50 MG 24 hr tablet TAKE 1 TABLET BY MOUTH EVERY DAY **NEEDS OV** 07/13/13  Yes Porfirio Oar, PA-C  simvastatin (ZOCOR) 20 MG tablet Take 20 mg by mouth every evening.   Yes Historical Provider, MD   BP 129/72 mmHg  Pulse 116  Temp(Src) 98.1 F (36.7 C) (Oral)  Resp 18  SpO2 100% Physical Exam  Constitutional: He is oriented to person, place, and time. He appears well-developed and well-nourished.  HENT:  Head: Normocephalic and atraumatic.  Mild dry mucus membranes  Eyes: Conjunctivae are normal. Right  eye exhibits no discharge. Left eye exhibits no discharge.  Neck: Normal range of motion. Neck supple. No tracheal deviation present.  Cardiovascular: Normal rate and regular rhythm.   Pulmonary/Chest: Effort normal and breath sounds normal.  Abdominal: Soft. He exhibits no distension. There is tenderness (mild epigastric and left mid abdomen). There is no guarding.  Musculoskeletal: He exhibits no edema.  Neurological: He is alert and oriented to person, place, and time.  Skin: Skin is warm. No rash noted.  Psychiatric: He has a normal mood and affect.  Nursing note and vitals reviewed.   ED Course  Procedures (including critical care time) Labs Review Labs Reviewed  COMPREHENSIVE METABOLIC PANEL - Abnormal; Notable for the following:    Glucose, Bld 107 (*)    BUN 45 (*)    Creatinine, Ser 2.30 (*)    Calcium 11.0 (*)    GFR calc non Af Amer 27 (*)    GFR calc Af Amer 31 (*)    All other components within normal limits  CBC - Abnormal; Notable for the following:    WBC 12.2 (*)    All other components within normal limits  POC OCCULT BLOOD, ED - Abnormal; Notable for the following:    Fecal Occult Bld POSITIVE (*)    All other components within normal limits  TYPE AND SCREEN  ABO/RH    Imaging Review Ct Renal Stone Study  05/05/2015   CLINICAL DATA:  Mid abdominal pain. LEFT lower quadrant pain since yesterday. Nausea and vomiting with blood. Dark stools.  EXAM: CT ABDOMEN AND PELVIS WITHOUT CONTRAST  TECHNIQUE: Multidetector CT imaging of the abdomen and pelvis was performed following the standard protocol without IV contrast.  COMPARISON:  12/01/2011.  MRI 03/29/2013.  FINDINGS: Musculoskeletal: No aggressive osseous lesions. L4-L5 predominant lumbar spondylosis. Dextroconvex curve of the lumbar spine with 1 cm rightward slip of L4 on L5.  Lung Bases: Dependent atelectasis.  Liver: Unenhanced CT was performed per clinician order. Lack of IV contrast limits sensitivity and  specificity, especially for evaluation of abdominal/pelvic solid viscera. Grossly normal.  Spleen:  Normal.  Gallbladder:  Contracted.  No calcified stones.  Common bile duct:  Normal.  Pancreas:  Normal.  Adrenal glands:  Normal bilaterally.  Kidneys: Kidneys are at the lower end of normal size bilaterally. Nonobstructing bilateral renal collecting system calculi. The LEFT ureter demonstrates a 4 mm calculus just proximal to the LEFT UVJ. No hydronephrosis. RIGHT ureter is within normal limits.  Stomach:  Small hiatal hernia.  No inflammatory changes of stomach.  Small bowel:  Normal.  Colon: No inflammatory changes or obstruction. The appendix is not identified. Colonic diverticulosis without diverticulitis. Staple line in the sigmoid colon compatible with partial sigmoidectomy.  Pelvic Genitourinary: Prostate calcifications. Urinary bladder appears normal.  Peritoneum: No free fluid or free air.  Vascular/lymphatic: Coronary artery atherosclerosis is present. If office based assessment of coronary risk factors has not been performed, it is now recommended. Tortuous atherosclerotic abdominal aorta. Iliofemoral atherosclerosis.  Body Wall: Fat containing LEFT inguinal hernia.  IMPRESSION: 1.  4 mm distal LEFT ureteral stone without hydronephrosis. 2. Multiple nonobstructing residual collecting system calculi. 3. Partial sigmoidectomy. 4. Atherosclerosis and coronary artery disease.   Electronically Signed   By: Andreas Newport M.D.   On: 05/05/2015 14:05     EKG Interpretation None      MDM   Final diagnoses:  Abdominal pain, LLQ  Kidney stone on left side  Acute GI bleeding  CRF  Patient presents with concern for GI bleed ulcer versus diverticular. Plan for blood work, CT scan and likely admission the hospital. Patient has baseline kidney dysfunction, no contrast.  No acute changes on recheck, no significant episodes of bleeding. CT scan results reviewed showing kidney stone, limited due to no  contrast. Discussed with hospitalist for observation/admission for further evaluation.  The patients results and plan were reviewed and discussed.   Any x-rays performed were independently reviewed by myself.   Differential diagnosis were considered with the presenting HPI.  Medications  0.9 %  sodium chloride infusion (not administered)  sodium chloride 0.9 % bolus 500 mL (0 mLs Intravenous Stopped 05/05/15 1425)    Filed Vitals:   05/05/15 1229 05/05/15 1422  BP: 129/88 129/72  Pulse: 116   Temp: 98.1 F (36.7 C)   TempSrc: Oral   Resp: 18 18  SpO2: 100%     Final diagnoses:  Abdominal pain, LLQ  Kidney stone on left side  Acute GI bleeding    Admission/ observation were discussed with the admitting physician, patient and/or family and they are comfortable with the plan.     Blane Ohara, MD 05/05/15 [phone]

## 2015-05-05 NOTE — H&P (Signed)
History and Physical:    Jordan BaldyLarry W Federici   ZOX:[id number] DOB: 12-Apr-1946 DOA: 05/05/2015  Referring MD/provider: Dr. Jodi MourningZavitz PCP: Emeterio ReeveWOLTERS,SHARON A, MD   Chief Complaint: Bloody stools and hematemesis  History of Present Illness:   Jordan BaldyLarry W [name] is an 69 y.o. male with a PMH of anemia, diverticulitis, high blood pressure, required blood transfusion years ago, diverticular surgery history the past presents with blood in the stools over the past 24 hours.  He also complains of abdominal pain and "gas" that he thinks is related to taking "too many BC Powders". Patient has been taking BC powders for neck pain for the past few days, says he uses 3-5 of these over the past 2 weeks. Patient had 2 episodes of hematemesis prior to arrival consisting of frank blood and had the appearance of coffee grounds. No history of known ulcers. No syncope. No chest pain. General fatigue.  No current GI doctor. Reports having had a colonoscopy "years ago" but cannot remember who did it.   ROS:   Review of Systems  Constitutional: Negative for fever, chills, weight loss, malaise/fatigue and diaphoresis.  Eyes: Positive for redness.  Respiratory: Positive for cough. Negative for hemoptysis, sputum production, shortness of breath and wheezing.   Cardiovascular: Negative for chest pain, palpitations, orthopnea, claudication, leg swelling and PND.  Gastrointestinal: Positive for heartburn, vomiting, abdominal pain, diarrhea, blood in stool and melena. Negative for nausea and constipation.  Genitourinary: Negative.   Musculoskeletal: Positive for joint pain, falls and neck pain.  Skin: Negative.   Neurological: Positive for weakness. Negative for dizziness and loss of consciousness.  Endo/Heme/Allergies: Positive for environmental allergies.  Psychiatric/Behavioral: Positive for depression.     Past Medical History:   Past Medical History  Diagnosis Date  . Hypertension   . Neck pain   . Kidney stone     . Bipolar 1 disorder   . Diverticulitis   . Anxiety   . Arthritis   . Sleep apnea   . Chronic headaches     Past Surgical History:   Past Surgical History  Procedure Laterality Date  . Appendectomy      69 years old  . Partial colectomy    . Spine surgery  2013  . Anterior cervical decomp/discectomy fusion N/A 12/04/2012    Procedure: ANTERIOR CERVICAL DECOMPRESSION/DISCECTOMY FUSION 2 LEVELS;  Surgeon: Temple PaciniHenry A Pool, MD;  Location: [facility];  Service: Neurosurgery;  Laterality: N/A;  Cervical five-six, six-seven anterior cervical decompression/discectomy fusion with allograft and plating    Social History:   History   Social History  . Marital Status: Married    Spouse Name: Bonita QuinLinda  . Number of Children: 0  . Years of Education: N/A   Occupational History  . [employer] psychologistroof contractor    Social History Main Topics  . Smoking status: Former Smoker -- 1.50 packs/day for 20 years    Quit date: 10/22/1983  . Smokeless tobacco: Never Used  . Alcohol Use: No  . Drug Use: No  . Sexual Activity: Not on file   Other Topics Concern  . Not on file   Social History Narrative   Married.  Independent of ADLs.  Ambulates independently.    Family history:   Family History  Problem Relation Age of Onset  . Anesthesia problems Neg Hx   . Heart attack Father   . Liver cancer Paternal Grandfather     unsure of other cancers   . Diabetes Sister  Allergies   Review of patient's allergies indicates no known allergies.  Current Medications:   Prior to Admission medications   Medication Sig Start Date End Date Taking? Authorizing Provider  Aspirin-Salicylamide-Caffeine (BC HEADACHE POWDER PO) Take 1 packet by mouth 4 (four) times daily as needed (headache an neck pain).    Yes Historical Provider, MD  FLUoxetine (PROZAC) 20 MG capsule Take 20 mg by mouth daily.   Yes Historical Provider, MD  Glucosamine HCl (GLUCOSAMINE PO) Take 1 tablet by mouth daily.   Yes Historical  Provider, MD  lamoTRIgine (LAMICTAL) 200 MG tablet Take 200 mg by mouth daily.   Yes Historical Provider, MD  lisinopril-hydrochlorothiazide (PRINZIDE,ZESTORETIC) 20-12.5 MG per tablet Take 1 tablet by mouth daily.   Yes Historical Provider, MD  metoprolol succinate (TOPROL-XL) 50 MG 24 hr tablet TAKE 1 TABLET BY MOUTH EVERY DAY **NEEDS OV** 07/13/13  Yes Chelle Jeffery, PA-C  simvastatin (ZOCOR) 20 MG tablet Take 20 mg by mouth every evening.   Yes Historical Provider, MD    Physical Exam:   Filed Vitals:   05/05/15 1229 05/05/15 1422  BP: 129/88 129/72  Pulse: 116   Temp: 98.1 F (36.7 C)   TempSrc: Oral   Resp: 18 18  SpO2: 100%      Physical Exam: Blood pressure 129/72, pulse 116, temperature 98.1 F (36.7 C), temperature source Oral, resp. rate 18, SpO2 100 %. Gen: No acute distress. Head: Normocephalic, atraumatic. Eyes: PERRL, EOMI, sclerae nonicteric. Mouth: Oropharynx clear. Neck: Supple, no thyromegaly, no lymphadenopathy, no jugular venous distention. Chest: Lungs clear to auscultation bilaterally. CV: Heart sounds are tachycardic but no murmurs, rubs, or gallops. Abdomen: Soft, mildly tender midepigastric area, nondistended with normal active bowel sounds. Extremities: Extremities are with trace edema bilaterally. Skin: Warm and dry. Neuro: Alert and oriented times 3; grossly nonfocal. Psych: Mood and affect depressed.   Data Review:    Labs: Basic Metabolic Panel:  Recent Labs Lab 05/05/15 1247  NA 140  K 3.9  CL 107  CO2 24  GLUCOSE 107*  BUN 45*  CREATININE 2.30*  CALCIUM 11.0*   Liver Function Tests:  Recent Labs Lab 05/05/15 1247  AST 24  ALT 21  ALKPHOS 85  BILITOT 0.6  PROT 7.6  ALBUMIN 4.6   CBC:  Recent Labs Lab 05/05/15 1247  WBC 12.2*  HGB 13.4  HCT 39.0  MCV 88.0  PLT 312    Radiographic Studies: Ct Renal Stone Study  05/05/2015   CLINICAL DATA:  Mid abdominal pain. LEFT lower quadrant pain since yesterday.  Nausea and vomiting with blood. Dark stools.  EXAM: CT ABDOMEN AND PELVIS WITHOUT CONTRAST  TECHNIQUE: Multidetector CT imaging of the abdomen and pelvis was performed following the standard protocol without IV contrast.  COMPARISON:  12/01/2011.  MRI 03/29/2013.  FINDINGS: Musculoskeletal: No aggressive osseous lesions. L4-L5 predominant lumbar spondylosis. Dextroconvex curve of the lumbar spine with 1 cm rightward slip of L4 on L5.  Lung Bases: Dependent atelectasis.  Liver: Unenhanced CT was performed per clinician order. Lack of IV contrast limits sensitivity and specificity, especially for evaluation of abdominal/pelvic solid viscera. Grossly normal.  Spleen:  Normal.  Gallbladder:  Contracted.  No calcified stones.  Common bile duct:  Normal.  Pancreas:  Normal.  Adrenal glands:  Normal bilaterally.  Kidneys: Kidneys are at the lower end of normal size bilaterally. Nonobstructing bilateral renal collecting system calculi. The LEFT ureter demonstrates a 4 mm calculus just proximal to the LEFT UVJ. No hydronephrosis.  RIGHT ureter is within normal limits.  Stomach:  Small hiatal hernia.  No inflammatory changes of stomach.  Small bowel:  Normal.  Colon: No inflammatory changes or obstruction. The appendix is not identified. Colonic diverticulosis without diverticulitis. Staple line in the sigmoid colon compatible with partial sigmoidectomy.  Pelvic Genitourinary: Prostate calcifications. Urinary bladder appears normal.  Peritoneum: No free fluid or free air.  Vascular/lymphatic: Coronary artery atherosclerosis is present. If office based assessment of coronary risk factors has not been performed, it is now recommended. Tortuous atherosclerotic abdominal aorta. Iliofemoral atherosclerosis.  Body Wall: Fat containing LEFT inguinal hernia.  IMPRESSION: 1. 4 mm distal LEFT ureteral stone without hydronephrosis. 2. Multiple nonobstructing residual collecting system calculi. 3. Partial sigmoidectomy. 4. Atherosclerosis  and coronary artery disease.   Electronically Signed   By: Andreas Newport M.D.   On: 05/05/2015 14:05   *I have personally reviewed the images above*    Assessment/Plan:   Principal Problem:   Acute GI bleeding - Although patient has a history of diverticulosis, he had 2 episodes of hematemesis and 2 weeks of heavy BC powder use raising the possibility of an upper GI bleed secondary to gastric ulceration. - Placed on PPI therapy. - Placed on bowel rest, clear liquids only. - GI consultation requested for consideration of EGD. - We'll monitor H&H every 8 hours 24 hours. - Hydrate.  Active Problems:   Left ureteral stone - Incidentally discovered. Hydrate. No evidence of hydronephrosis.    Hyperlipidemia - Continue statin.    Essential hypertension - Continue metoprolol but hold lisinopril/HCTZ given acute kidney injury.    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    AKI (acute kidney injury) - Likely related to aspirin use in the setting of ongoing lisinopril/HCTZ and dehydration related to N/V/D. - Hydrate, hold lisinopril/HCTZ.  DVT prophylaxis - SCDs only given acute GI bleeding.  Code Status: Full. Family Communication: No family at bedside.  Bonita Quin (wife) is emergency contact. Disposition Plan: Home when stable.  Time spent: One hour.  Jaela Yepez [facility] Pager [phone] Cell: [phone]   If 7PM-7AM, please contact night-coverage [email or web address] Password [facility] 05/05/2015, 3:59 PM

## 2015-05-05 NOTE — ED Notes (Signed)
Per pt, states he has been taking a lot of aspirin for neck pain-now having dark stools and vomittied some blood yesterday

## 2015-05-05 NOTE — ED Notes (Signed)
Stool for hemoccult collected by RN.

## 2015-05-05 NOTE — ED Notes (Signed)
Spoke to pam and pt can go at 15:20.Jordan [name].[email or web address]

## 2015-05-05 NOTE — ED Notes (Signed)
Bed: WA03 Expected date:  Expected time:  Means of arrival:  Comments: HOLD for triage

## 2015-05-06 ENCOUNTER — Encounter (HOSPITAL_COMMUNITY)
Admission: EM | Disposition: A | Discharge status: Home / Self Care | Payer: Self-pay | Source: Home / Self Care | Attending: Internal Medicine

## 2015-05-06 ENCOUNTER — Encounter (HOSPITAL_COMMUNITY): Payer: Self-pay | Admitting: Internal Medicine

## 2015-05-06 DIAGNOSIS — K449 Diaphragmatic hernia without obstruction or gangrene: Secondary | ICD-10-CM

## 2015-05-06 DIAGNOSIS — K2950 Unspecified chronic gastritis without bleeding: Secondary | ICD-10-CM | POA: Diagnosis present

## 2015-05-06 DIAGNOSIS — K2901 Acute gastritis with bleeding: Secondary | ICD-10-CM

## 2015-05-06 DIAGNOSIS — K209 Esophagitis, unspecified without bleeding: Secondary | ICD-10-CM | POA: Diagnosis present

## 2015-05-06 DIAGNOSIS — N183 Chronic kidney disease, stage 3 unspecified: Secondary | ICD-10-CM | POA: Diagnosis present

## 2015-05-06 DIAGNOSIS — K2960 Other gastritis without bleeding: Secondary | ICD-10-CM | POA: Diagnosis present

## 2015-05-06 HISTORY — PX: ESOPHAGOGASTRODUODENOSCOPY: SHX5428

## 2015-05-06 LAB — BASIC METABOLIC PANEL
Anion gap: 9 (ref 5–15)
BUN: 37 mg/dL — AB (ref 6–20)
CALCIUM: 9.7 mg/dL (ref 8.9–10.3)
CO2: 28 mmol/L (ref 22–32)
Chloride: 105 mmol/L (ref 101–111)
Creatinine, Ser: 2.22 mg/dL — ABNORMAL HIGH (ref 0.61–1.24)
GFR calc Af Amer: 33 mL/min — ABNORMAL LOW (ref >60)
GFR, EST NON AFRICAN AMERICAN: 28 mL/min — AB (ref >60)
Glucose, Bld: 95 mg/dL (ref 65–99)
Potassium: 4.2 mmol/L (ref 3.5–5.1)
Sodium: 142 mmol/L (ref 135–145)

## 2015-05-06 LAB — HEMATOCRIT
HCT: 36.8 % — ABNORMAL LOW (ref 39.0–52.0)
HCT: 33 % — ABNORMAL LOW (ref 39.0–52.0)

## 2015-05-06 LAB — HEMOGLOBIN
Hemoglobin: 12.1 g/dL — ABNORMAL LOW (ref 13.0–17.0)
Hemoglobin: 11 g/dL — ABNORMAL LOW (ref 13.0–17.0)

## 2015-05-06 SURGERY — EGD (ESOPHAGOGASTRODUODENOSCOPY)
Anesthesia: Moderate Sedation

## 2015-05-06 MED ORDER — FENTANYL CITRATE (PF) 100 MCG/2ML IJ SOLN
INTRAMUSCULAR | Status: DC | PRN
  Administered 2015-05-06 (×2): 25 ug via INTRAVENOUS

## 2015-05-06 MED ORDER — PANTOPRAZOLE SODIUM 40 MG PO TBEC
40.0000 mg | DELAYED_RELEASE_TABLET | Freq: Every day | ORAL | Status: DC
  Administered 2015-05-06: 40 mg via ORAL
  Filled 2015-05-06: qty 1

## 2015-05-06 MED ORDER — LANSOPRAZOLE 15 MG PO CPDR: 15.0000 mg | DELAYED_RELEASE_CAPSULE | Freq: Every day | ORAL | Status: DC

## 2015-05-06 MED ORDER — MIDAZOLAM HCL 10 MG/2ML IJ SOLN
INTRAMUSCULAR | Status: DC | PRN
  Administered 2015-05-06 (×2): 2 mg via INTRAVENOUS

## 2015-05-06 MED ORDER — BUTAMBEN-TETRACAINE-BENZOCAINE 2-2-14 % EX AERO
INHALATION_SPRAY | CUTANEOUS | Status: DC | PRN
  Administered 2015-05-06: 2 via TOPICAL

## 2015-05-06 MED ORDER — OXYCODONE-ACETAMINOPHEN 5-325 MG PO TABS: 1.0000 | ORAL_TABLET | ORAL | Status: DC | PRN

## 2015-05-06 MED ORDER — MIDAZOLAM HCL 5 MG/ML IJ SOLN
INTRAMUSCULAR | Status: AC
  Filled 2015-05-06: qty 2

## 2015-05-06 MED ORDER — FENTANYL CITRATE (PF) 100 MCG/2ML IJ SOLN
INTRAMUSCULAR | Status: AC
  Filled 2015-05-06: qty 2

## 2015-05-06 NOTE — Discharge Summary (Addendum)
Physician Discharge Summary  Maxine Huynh [facility] ZOX:[id number] DOB: 18-Feb-1946 DOA: 05/05/2015  PCP: Emeterio Reeve, MD  Admit date: 05/05/2015 Discharge date: 05/06/2015   Recommendations for Outpatient Follow-Up:   1. F/U PCP in 1-2 weeks.  Repeat CBC/BMET ensure stability of hemoglobin and creatinine.   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    Discharge Condition: Improved.  Diet recommendation: Low sodium, heart healthy.    History of Present Illness:   Jordan [name] is an 69 y.o. male with a PMH of anemia, diverticulitis, high blood pressure, required blood transfusion years ago, diverticular surgery history, chronic neck pain for which she has been using multiple doses of BC powders daily who was admitted 05/05/15 with acute GI bleeding.   Hospital Course by Problem:   Principal Problem:  Acute GI bleeding secondary to antritis, linear esophagitis versus Mallory Weiss tear - Underwent EGD 05/06/15 showing antritis/bulbitis, linear esophagitis versus Mallory Weiss tear. - Placed on PPI therapy.  Instructed to avoid OTC NSAIDS, Goody Powders. - Placed on bowel rest, clear liquids only on admission, diet advanced prior to D/C. - 1.3 g drop in hemoglobin noted over the past 24 hours, but no indication for transfusion. - Cleared for D/C by GI after EGD.  Active Problems:  Left ureteral stone - Incidentally discovered. Hydrate. No evidence of hydronephrosis. Continue IV fluids.   Hyperlipidemia - Continue statin.   Essential hypertension - Continue metoprolol but held  lisinopril/HCTZ given acute kidney injury. - F/U PCP in 1 week to ensure stability of renal function.   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   AKI (acute kidney injury) / stage III chronic kidney disease - Likely related to aspirin use in the setting of ongoing lisinopril/HCTZ and dehydration related to N/V/D. - Hydrating, and holding lisinopril/HCTZ. - Baseline creatinine 1.8.   Medical Consultants:    None.   Discharge Exam:   Filed Vitals:   05/06/15 0845  BP: 129/88  Pulse: 71  Temp: 98.1 F (36.7 C)  Resp: 26   Filed Vitals:   05/06/15 0825 05/06/15 0830 05/06/15 0840 05/06/15 0845  BP:  124/81 139/105 129/88  Pulse: 81 72 72 71  Temp:    98.1 F (36.7 C)  TempSrc:    Oral  Resp: 22 21 17 26   Height:      Weight:      SpO2: 97% 93% 97% 97%    Gen:  NAD Cardiovascular:  RRR, No M/R/G Respiratory: Lungs CTAB Gastrointestinal: Abdomen soft, NT/ND with normal active bowel sounds. Extremities: No C/E/C   The results of significant diagnostics from this hospitalization (including imaging, microbiology, ancillary and laboratory) are listed below for reference.     Procedures and Diagnostic Studies:   EGD 05/06/15:  ENDOSCOPIC IMPRESSION: 1. Small hiatal hernia with possible small Mallory-Weiss tear versus linear esophagitis 2. Minimal antritis 3. Mild bulbitis 4. Otherwise within normal limits EGD to the third portion of the duodenum without signs of active bleeding  RECOMMENDATIONS: advance  diet 2 weeks of over-the-counter pump inhibitor no aspirin or non-steroidal's GI follow-up when necessary okay to go home soon if diet tolerated  Labs:   Basic Metabolic Panel:  Recent Labs Lab 05/05/15 1247 05/06/15 0110  NA 140 142  K 3.9 4.2  CL 107 105  CO2 24 28  GLUCOSE 107* 95  BUN 45* 37*  CREATININE 2.30* 2.22*  CALCIUM 11.0* 9.7   GFR Estimated Creatinine Clearance: 34.2 mL/min (by C-G formula  based on Cr of 2.22). Liver Function Tests:  Recent Labs Lab 05/05/15 1247  AST 24  ALT 21  ALKPHOS 85  BILITOT 0.6  PROT 7.6  ALBUMIN 4.6   CBC:  Recent Labs Lab 05/05/15 1247 05/05/15 1830 05/06/15 0110 05/06/15 0935  WBC 12.2*  --   --   --   HGB 13.4 12.0* 12.1* 11.0*  HCT 39.0 36.9* 36.8* 33.0*  MCV 88.0  --   --   --   PLT 312  --   --   --      Discharge Instructions:   Discharge Instructions    Call MD for:  extreme fatigue    Complete by:  As directed      Call MD for:  persistant nausea and vomiting    Complete by:  As directed      Call MD for:  severe uncontrolled pain    Complete by:  As directed      Diet - low sodium heart healthy    Complete by:  As directed      Discharge instructions    Complete by:  As directed   Avoid OTC pain relievers except for tylenol.  Do not take more than 4,000 mg of Tylenol/24 hours.  Keep in mind that each Percocet tablet has 325 mg of Tylenol in it.     Increase activity slowly    Complete by:  As directed             Medication List    STOP taking these medications        BC HEADACHE POWDER PO      TAKE these medications        FLUoxetine 20 MG capsule  Commonly known as:  PROZAC  Take 20 mg by mouth daily.     GLUCOSAMINE PO  Take 1 tablet by mouth daily.     lamoTRIgine 200 MG tablet  Commonly known as:  LAMICTAL  Take 200 mg by mouth daily.     lansoprazole 15 MG capsule  Commonly known as:  PREVACID  Take 1 capsule (15 mg total) by mouth daily at 12 noon.     lisinopril-hydrochlorothiazide 20-12.5 MG per tablet  Commonly known as:  PRINZIDE,ZESTORETIC  Take 1 tablet by mouth daily.     metoprolol succinate 50 MG 24 hr tablet  Commonly known as:  TOPROL-XL  TAKE 1 TABLET BY MOUTH EVERY DAY **NEEDS OV**     oxyCODONE-acetaminophen 5-325 MG per tablet  Commonly known as:  ROXICET  Take 1 tablet by mouth every 4 (four) hours as needed for severe pain.     simvastatin 20 MG tablet    Commonly known as:  ZOCOR  Take 20 mg by mouth every evening.           Follow-up Information    Follow up with Emeterio Reeve, MD. Schedule an appointment as soon as possible for a visit in 2 weeks.   Specialty:  Family Medicine   Why:  Hospital follow up, check blood counts.   Contact information:   [address] [place] [address] [phone]        Time coordinating discharge: 25 minutes.  Signed:  Seif Teichert  Pager [phone] [facility] 05/06/2015, 11:28 AM

## 2015-05-06 NOTE — Op Note (Signed)
[facility] [address] [place], [id number]   ENDOSCOPY PROCEDURE REPORT  PATIENT: Jordan [name], Davison W  MR#: [id number] BIRTHDATE: October 27, 1945 , 68  yrs. old GENDER: male ENDOSCOPIST: Vida RiggerMarc Lydia Meng, MD REFERRED BY: PROCEDURE DATE:  05/06/2015 PROCEDURE:  EGD, diagnostic ASA CLASS:     Class III INDICATIONS:  hematemesis. MEDICATIONS: Fentanyl 50 mcg IV and Versed 4 mg IV TOPICAL ANESTHETIC: Cetacaine Spray  DESCRIPTION OF PROCEDURE: After the risks benefits and alternatives of the procedure were thoroughly explained, informed consent was obtained.  The Pentax Gastroscope [id number] endoscope was introduced through the mouth and advanced to the third portion of the duodenum , Without limitations.  The instrument was slowly withdrawn as the mucosa was fully examined. Estimated blood loss is zero unless otherwise noted in this procedure report.    the findings are recorded below       Retroflexed views revealed a hiatal hernia.     The scope was then withdrawn from the patient and the procedure completed.  COMPLICATIONS: There were no immediate complications.  ENDOSCOPIC IMPRESSION: 1. Small hiatal hernia with possible small Mallory-Weiss tear versus linear esophagitis 2. Minimal antritis 3. Mild bulbitis 4. Otherwise within normal limits EGD to the third portion of the duodenum without signs of active bleeding  RECOMMENDATIONS: advance diet 2 weeks of over-the-counter pump inhibitor no aspirin or non-steroidal's GI follow-up when necessary okay to go home soon if diet tolerated  REPEAT EXAM: as needed  eSigned:  Vida RiggerMarc Lyanna Blystone, MD 05/06/2015 8:49 AM    CC:  CPT CODES: ICD CODES:  The ICD and CPT codes recommended by this software are interpretations from the data that the clinical staff has captured with the software.  The verification of the translation of this report to the ICD and CPT codes and modifiers is the sole responsibility of the health care  institution and practicing physician where this report was generated.  [employer] will not be held responsible for the validity of the ICD and CPT codes included on this report.  AMA assumes no liability for data contained or not contained herein. CPT is a Publishing rights managerregistered trademark of the [employer].  PATIENT NAME:  Jordan [name], Sergey W MR#: [id number]

## 2015-05-06 NOTE — Discharge Instructions (Signed)

## 2015-05-06 NOTE — Progress Notes (Signed)
Dola FactorLarry W Remick 8:19 AM  Subjective: Patient without any further signs of bleeding doing well without any complaints  Objective: Vital signs stable afebrile no acute distress hemoglobin stable slight decreased BUN physical exam please see preassessment evaluation  Assessment: Upper GI bleeding in a patient on aspirin non-steroidal's  Plan: Okay to proceed with endoscopy with further workup plans and recommendations pending those findings  [facility],Reed Eifert E  Pager [phone] After 5PM or if no answer call [phone]

## 2015-05-08 ENCOUNTER — Encounter (HOSPITAL_COMMUNITY): Payer: Self-pay | Admitting: Gastroenterology

## 2015-07-18 ENCOUNTER — Other Ambulatory Visit: Payer: Self-pay | Admitting: Family Medicine

## 2015-07-18 ENCOUNTER — Ambulatory Visit
Admission: RE | Admit: 2015-07-18 | Discharge: 2015-07-18 | Disposition: A | Discharge status: Home / Self Care | Payer: PPO | Source: Ambulatory Visit | Attending: Family Medicine | Admitting: Family Medicine

## 2015-07-18 DIAGNOSIS — R062 Wheezing: Secondary | ICD-10-CM

## 2015-07-28 ENCOUNTER — Encounter (HOSPITAL_COMMUNITY): Payer: Self-pay | Admitting: *Deleted

## 2015-07-28 ENCOUNTER — Observation Stay (HOSPITAL_COMMUNITY): Payer: PPO

## 2015-07-28 ENCOUNTER — Observation Stay (HOSPITAL_COMMUNITY)
Admission: EM | Admit: 2015-07-28 | Discharge: 2015-07-30 | Disposition: A | Discharge status: Home / Self Care | Payer: PPO | Attending: Family Medicine | Admitting: Family Medicine

## 2015-07-28 ENCOUNTER — Emergency Department (HOSPITAL_COMMUNITY): Payer: PPO

## 2015-07-28 DIAGNOSIS — N183 Chronic kidney disease, stage 3 unspecified: Secondary | ICD-10-CM | POA: Diagnosis present

## 2015-07-28 DIAGNOSIS — R05 Cough: Secondary | ICD-10-CM | POA: Diagnosis not present

## 2015-07-28 DIAGNOSIS — M542 Cervicalgia: Secondary | ICD-10-CM | POA: Diagnosis not present

## 2015-07-28 DIAGNOSIS — Z7982 Long term (current) use of aspirin: Secondary | ICD-10-CM | POA: Insufficient documentation

## 2015-07-28 DIAGNOSIS — I110 Hypertensive heart disease with heart failure: Secondary | ICD-10-CM | POA: Diagnosis present

## 2015-07-28 DIAGNOSIS — F419 Anxiety disorder, unspecified: Secondary | ICD-10-CM | POA: Insufficient documentation

## 2015-07-28 DIAGNOSIS — Z87891 Personal history of nicotine dependence: Secondary | ICD-10-CM | POA: Insufficient documentation

## 2015-07-28 DIAGNOSIS — I2510 Atherosclerotic heart disease of native coronary artery without angina pectoris: Secondary | ICD-10-CM | POA: Insufficient documentation

## 2015-07-28 DIAGNOSIS — I129 Hypertensive chronic kidney disease with stage 1 through stage 4 chronic kidney disease, or unspecified chronic kidney disease: Secondary | ICD-10-CM | POA: Insufficient documentation

## 2015-07-28 DIAGNOSIS — Z8673 Personal history of transient ischemic attack (TIA), and cerebral infarction without residual deficits: Secondary | ICD-10-CM | POA: Diagnosis not present

## 2015-07-28 DIAGNOSIS — R55 Syncope and collapse: Principal | ICD-10-CM | POA: Diagnosis present

## 2015-07-28 DIAGNOSIS — F319 Bipolar disorder, unspecified: Secondary | ICD-10-CM | POA: Diagnosis not present

## 2015-07-28 DIAGNOSIS — Z87442 Personal history of urinary calculi: Secondary | ICD-10-CM | POA: Diagnosis not present

## 2015-07-28 DIAGNOSIS — K449 Diaphragmatic hernia without obstruction or gangrene: Secondary | ICD-10-CM | POA: Diagnosis not present

## 2015-07-28 DIAGNOSIS — Z9049 Acquired absence of other specified parts of digestive tract: Secondary | ICD-10-CM | POA: Insufficient documentation

## 2015-07-28 DIAGNOSIS — R51 Headache: Secondary | ICD-10-CM | POA: Insufficient documentation

## 2015-07-28 DIAGNOSIS — I10 Essential (primary) hypertension: Secondary | ICD-10-CM | POA: Diagnosis not present

## 2015-07-28 DIAGNOSIS — G4730 Sleep apnea, unspecified: Secondary | ICD-10-CM | POA: Diagnosis not present

## 2015-07-28 LAB — URINALYSIS, ROUTINE W REFLEX MICROSCOPIC
BILIRUBIN URINE: NEGATIVE
Glucose, UA: NEGATIVE mg/dL
Hgb urine dipstick: NEGATIVE
Ketones, ur: NEGATIVE mg/dL
LEUKOCYTES UA: NEGATIVE
NITRITE: NEGATIVE
Protein, ur: NEGATIVE mg/dL
Specific Gravity, Urine: 1.018 (ref 1.005–1.030)
UROBILINOGEN UA: 0.2 mg/dL (ref 0.0–1.0)
pH: 5.5 (ref 5.0–8.0)

## 2015-07-28 LAB — BASIC METABOLIC PANEL
Anion gap: 9 (ref 5–15)
BUN: 32 mg/dL — AB (ref 6–20)
CHLORIDE: 107 mmol/L (ref 101–111)
CO2: 22 mmol/L (ref 22–32)
Calcium: 9.4 mg/dL (ref 8.9–10.3)
Creatinine, Ser: 2.53 mg/dL — ABNORMAL HIGH (ref 0.61–1.24)
GFR calc Af Amer: 28 mL/min — ABNORMAL LOW (ref >60)
GFR, EST NON AFRICAN AMERICAN: 24 mL/min — AB (ref >60)
Glucose, Bld: 121 mg/dL — ABNORMAL HIGH (ref 65–99)
POTASSIUM: 3.7 mmol/L (ref 3.5–5.1)
SODIUM: 138 mmol/L (ref 135–145)

## 2015-07-28 LAB — CBC
HEMATOCRIT: 43.1 % (ref 39.0–52.0)
HEMOGLOBIN: 13.8 g/dL (ref 13.0–17.0)
MCH: 26.6 pg (ref 26.0–34.0)
MCHC: 32 g/dL (ref 30.0–36.0)
MCV: 83 fL (ref 78.0–100.0)
Platelets: 377 10*3/uL (ref 150–400)
RBC: 5.19 MIL/uL (ref 4.22–5.81)
RDW: 15.6 % — AB (ref 11.5–15.5)
WBC: 7.4 10*3/uL (ref 4.0–10.5)

## 2015-07-28 LAB — I-STAT TROPONIN, ED: TROPONIN I, POC: 0 ng/mL (ref 0.00–0.08)

## 2015-07-28 LAB — TSH: TSH: 1.702 u[IU]/mL (ref 0.350–4.500)

## 2015-07-28 LAB — TROPONIN I

## 2015-07-28 LAB — D-DIMER, QUANTITATIVE (NOT AT ARMC): D DIMER QUANT: 0.77 ug{FEU}/mL — AB (ref 0.00–0.48)

## 2015-07-28 MED ORDER — SODIUM CHLORIDE 0.9 % IJ SOLN
3.0000 mL | Freq: Two times a day (BID) | INTRAMUSCULAR | Status: DC
  Administered 2015-07-28 – 2015-07-30 (×3): 3 mL via INTRAVENOUS

## 2015-07-28 MED ORDER — PANTOPRAZOLE SODIUM 40 MG PO TBEC
40.0000 mg | DELAYED_RELEASE_TABLET | Freq: Every day | ORAL | Status: DC
  Administered 2015-07-29 – 2015-07-30 (×2): 40 mg via ORAL
  Filled 2015-07-28 (×3): qty 1

## 2015-07-28 MED ORDER — TECHNETIUM TO 99M ALBUMIN AGGREGATED: 4.3000 | Freq: Once | INTRAVENOUS | Status: DC | PRN

## 2015-07-28 MED ORDER — ACETAMINOPHEN 325 MG PO TABS
650.0000 mg | ORAL_TABLET | Freq: Four times a day (QID) | ORAL | Status: DC | PRN
Start: 2015-07-28 — End: 2015-07-30
  Administered 2015-07-29: 650 mg via ORAL
  Filled 2015-07-28 (×2): qty 2

## 2015-07-28 MED ORDER — ACETAMINOPHEN 325 MG PO TABS
650.0000 mg | ORAL_TABLET | Freq: Once | ORAL | Status: AC
  Administered 2015-07-28: 650 mg via ORAL
  Filled 2015-07-28: qty 2

## 2015-07-28 MED ORDER — ACETAMINOPHEN 650 MG RE SUPP: 650.0000 mg | Freq: Four times a day (QID) | RECTAL | Status: DC | PRN

## 2015-07-28 MED ORDER — LAMOTRIGINE 100 MG PO TABS
200.0000 mg | ORAL_TABLET | Freq: Every day | ORAL | Status: DC
  Administered 2015-07-29 – 2015-07-30 (×2): 200 mg via ORAL
  Filled 2015-07-28 (×3): qty 2

## 2015-07-28 MED ORDER — ENOXAPARIN SODIUM 30 MG/0.3ML ~~LOC~~ SOLN
30.0000 mg | SUBCUTANEOUS | Status: DC
  Administered 2015-07-28: 30 mg via SUBCUTANEOUS
  Filled 2015-07-28: qty 0.3

## 2015-07-28 MED ORDER — METOPROLOL SUCCINATE ER 50 MG PO TB24
50.0000 mg | ORAL_TABLET | Freq: Every day | ORAL | Status: DC
  Administered 2015-07-29 – 2015-07-30 (×2): 50 mg via ORAL
  Filled 2015-07-28 (×3): qty 1

## 2015-07-28 MED ORDER — ONDANSETRON HCL 4 MG/2ML IJ SOLN: 4.0000 mg | Freq: Three times a day (TID) | INTRAMUSCULAR | Status: AC | PRN

## 2015-07-28 MED ORDER — SODIUM CHLORIDE 0.9 % IV SOLN: INTRAVENOUS | Status: DC

## 2015-07-28 MED ORDER — FLUOXETINE HCL 20 MG PO CAPS
20.0000 mg | ORAL_CAPSULE | Freq: Every day | ORAL | Status: DC
  Administered 2015-07-29 – 2015-07-30 (×2): 20 mg via ORAL
  Filled 2015-07-28 (×2): qty 1

## 2015-07-28 MED ORDER — SIMVASTATIN 20 MG PO TABS
20.0000 mg | ORAL_TABLET | Freq: Every evening | ORAL | Status: DC
  Administered 2015-07-29: 20 mg via ORAL
  Filled 2015-07-28: qty 1

## 2015-07-28 MED ORDER — TECHNETIUM TC 99M DIETHYLENETRIAME-PENTAACETIC ACID: 29.6000 | Freq: Once | INTRAVENOUS | Status: DC | PRN

## 2015-07-28 NOTE — ED Notes (Signed)
Patient transported to X-ray 

## 2015-07-28 NOTE — ED Notes (Addendum)
Patient states that he has past out 3 times in 3 weeks. He went to his primary and had some blood drawn yesterday due to the syncopal epidodes. He was called and  was told to come to the ER due to abnormal lab values. He last "passed out" four day ago.

## 2015-07-28 NOTE — ED Provider Notes (Signed)
CSN: [id number]     Arrival date & time 07/28/15  1238 History   First MD Initiated Contact with Patient 07/28/15 1418     Chief Complaint  Patient presents with  . Abnormal Lab     (Consider location/radiation/quality/duration/timing/severity/associated sxs/prior Treatment) HPI Jordan [name] is a 69 y.o. male with history of hypertension, anxiety, stage III chronic kidney disease, coronary disease, presents to emergency department complaining of abnormal labs from primary care doctor office and syncopal episodes. Patient states he went on a cruise 2 weeks ago. While on the cruise had his first syncopal episode while eating lunch. He denies any prodromal symptoms, denies any dizziness, lightheadedness, chest pain, shortness of breath. He states he did have some cough at that time. He is unsure of cough caused his syncopal episode. When he came back from the cruise he had another syncopal episode and was seen by primary care doctor who diagnosed him with pneumonia. Patient finished a course of antibiotics and states his cough is improving. Patient states that he then again had another syncopal episode 2 days ago. He was back to the primary care doctor yesterday who did blood work and call them today saying that his potassium was elevated. Patient was told to come to emergency department. Patient denies any current symptoms.  Past Medical History  Diagnosis Date  . Hypertension   . Neck pain   . Kidney stone   . Bipolar 1 disorder (HCC)   . Diverticulitis   . Anxiety   . Arthritis   . Sleep apnea   . Chronic headaches   . Stage III chronic kidney disease     Baseline creatinine 1.8  . Left inguinal hernia   . Atherosclerosis   . CAD (coronary artery disease)   . Hiatal hernia 05/06/2015  . Esophagitis versus Mallory-Weiss tear 05/06/2015  . Antritis (stomach) 05/06/2015   Past Surgical History  Procedure Laterality Date  . Appendectomy      69 years old  . Partial colectomy    . Spine  surgery  2013  . Anterior cervical decomp/discectomy fusion N/A 12/04/2012    Procedure: ANTERIOR CERVICAL DECOMPRESSION/DISCECTOMY FUSION 2 LEVELS;  Surgeon: Temple Pacini, MD;  Location: [facility];  Service: Neurosurgery;  Laterality: N/A;  Cervical five-six, six-seven anterior cervical decompression/discectomy fusion with allograft and plating  . Esophagogastroduodenoscopy N/A 05/06/2015    Procedure: ESOPHAGOGASTRODUODENOSCOPY (EGD);  Surgeon: Vida Rigger, MD;  Location: [facility];  Service: Endoscopy;  Laterality: N/A;   Family History  Problem Relation Age of Onset  . Anesthesia problems Neg Hx   . Heart attack Father   . Liver cancer Paternal Grandfather     unsure of other cancers   . Diabetes Sister    Social History  Substance Use Topics  . Smoking status: Former Smoker -- 1.50 packs/day for 20 years    Quit date: 10/22/1983  . Smokeless tobacco: Never Used  . Alcohol Use: No    Review of Systems  Constitutional: Negative for fever and chills.  Respiratory: Positive for cough. Negative for chest tightness and shortness of breath.   Cardiovascular: Negative for chest pain, palpitations and leg swelling.  Gastrointestinal: Negative for nausea, vomiting, abdominal pain, diarrhea and abdominal distention.  Genitourinary: Negative for dysuria, urgency, frequency and hematuria.  Musculoskeletal: Negative for myalgias, arthralgias, neck pain and neck stiffness.  Skin: Negative for rash.  Allergic/Immunologic: Negative for immunocompromised state.  Neurological: Positive for syncope. Negative for dizziness, weakness, light-headedness, numbness and headaches.  All other systems reviewed and are negative.     Allergies  Review of patient's allergies indicates no known allergies.  Home Medications   Prior to Admission medications   Medication Sig Start Date End Date Taking? Authorizing Provider  FLUoxetine (PROZAC) 20 MG capsule Take 20 mg by mouth daily.    Historical  Provider, MD  Glucosamine HCl (GLUCOSAMINE PO) Take 1 tablet by mouth daily.    Historical Provider, MD  lamoTRIgine (LAMICTAL) 200 MG tablet Take 200 mg by mouth daily.    Historical Provider, MD  lansoprazole (PREVACID) 15 MG capsule Take 1 capsule (15 mg total) by mouth daily at 12 noon. 05/06/15   Maryruth Bun Rama, MD  lisinopril-hydrochlorothiazide (PRINZIDE,ZESTORETIC) 20-12.5 MG per tablet Take 1 tablet by mouth daily.    Historical Provider, MD  metoprolol succinate (TOPROL-XL) 50 MG 24 hr tablet TAKE 1 TABLET BY MOUTH EVERY DAY **NEEDS OV** 07/13/13   Chelle Jeffery, PA-C  oxyCODONE-acetaminophen (ROXICET) 5-325 MG per tablet Take 1 tablet by mouth every 4 (four) hours as needed for severe pain. 05/06/15   Maryruth Bun Rama, MD  simvastatin (ZOCOR) 20 MG tablet Take 20 mg by mouth every evening.    Historical Provider, MD   BP 118/78 mmHg  Pulse 66  Temp(Src) 97.7 F (36.5 C) (Oral)  Resp 17  Ht  (1.676 m)  Wt 127 lb (57.607 kg)  BMI 20.51 kg/m2  SpO2 96% Physical Exam  Constitutional: He is oriented to person, place, and time. He appears well-developed and well-nourished. No distress.  HENT:  Head: Normocephalic and atraumatic.  Eyes: Conjunctivae are normal.  Neck: Neck supple.  Cardiovascular: Normal rate, regular rhythm and normal heart sounds.   Pulmonary/Chest: Effort normal. No respiratory distress. He has no wheezes. He has no rales.  Abdominal: Soft. Bowel sounds are normal. He exhibits no distension. There is no tenderness. There is no rebound.  Musculoskeletal: He exhibits no edema.  Neurological: He is alert and oriented to person, place, and time. No cranial nerve deficit. Coordination normal.  Skin: Skin is warm and dry.  Nursing note and vitals reviewed.   ED Course  Procedures (including critical care time) Labs Review Labs Reviewed  BASIC METABOLIC PANEL - Abnormal; Notable for the following:    Glucose, Bld 121 (*)    BUN 32 (*)    Creatinine, Ser  2.53 (*)    GFR calc non Af Amer 24 (*)    GFR calc Af Amer 28 (*)    All other components within normal limits  CBC - Abnormal; Notable for the following:    RDW 15.6 (*)    All other components within normal limits  URINALYSIS, ROUTINE W REFLEX MICROSCOPIC (NOT AT [facility])  I-STAT TROPOININ, ED    Imaging Review No results found. I have personally reviewed and evaluated these images and lab results as part of my medical decision-making.   EKG Interpretation   Date/Time:  Friday July 28 2015 14:29:07 EDT Ventricular Rate:  62 PR Interval:  129 QRS Duration: 76 QT Interval:  410 QTC Calculation: 416 R Axis:   50 Text Interpretation:  Sinus rhythm Low voltage, precordial leads No  significant change since last tracing Confirmed by [facility] MD, ERIN  ([id number]) on 07/28/2015 5:04:49 PM      MDM   Final diagnoses:  Syncope  Chronic kidney disease, stage 3 (moderate)    Patient emergency department with 3 syncopal episodes, apparently elevated potassium yesterday in the office, recent pneumonia.  We'll get labs, recheck potassium, EKG, chest x-ray. Patient had recent travel: Get a d-dimer.  5:02 PM Patient lives unremarkable except for chronically elevated creatinine. Potassium is normal. D-dimer slightly elevated, but we will get a VQ scan, since elevated creatinine unable to get CT Angio. Patient's syncopal episodes are concerning,    especially with no preceding symptoms. Patient additionally had another episode while emergency department, while laying down in bed. Discussed with hospitalist who will admit patient for further evaluation workup. vital signs here are normal.   Filed Vitals:   07/28/15 1254 07/28/15 1449  BP: 108/64 118/78  Pulse: 81 66  Temp: 97.9 F (36.6 C) 97.7 F (36.5 C)  TempSrc: Oral Oral  Resp: 18 17  Height:  (1.676 m)   Weight: 127 lb (57.607 kg)   SpO2: 97% 96%     Jaynie Crumble, PA-C 07/28/15 1708  Courteney Lyn Mackuen,  MD 07/31/15 1531

## 2015-07-28 NOTE — ED Notes (Signed)
EKG given to Dr. Mackuen 

## 2015-07-28 NOTE — H&P (Signed)
PCP:   Emeterio Reeve, MD   Chief Complaint:  Passed out  HPI: 69 year old male who   has a past medical history of Hypertension; Neck pain; Kidney stone; Bipolar 1 disorder (HCC); Diverticulitis; Anxiety; Arthritis; Sleep apnea; Chronic headaches; Stage III chronic kidney disease; Left inguinal hernia; Atherosclerosis; CAD (coronary artery disease); Hiatal hernia (05/06/2015); Esophagitis versus Mallory-Weiss tear (05/06/2015); and Antritis (stomach) (05/06/2015). Today was brought to the ED for syncopal episodes. Patient says that he went on a cruise 2 weeks ago and traveled to [facility] medical and by car which was 6-7 hours dry. Patient says that he had his first episode of syncope at the close while eating lunch. Did not have any chest pain, no dizziness no shortness of breath. Though he says that he had some coughing at that time. When patient came back from the cruise he had another syncopal episode while sitting in toilet. Patient was diagnosed with pneumonia by his PCP and was started on antibiotic when his cough improved. Patient had his third syncopal episode 2 days ago when he was sitting at home. He does not remember any prodromal symptoms before that episode. Today his PCP called and told patient that potassium was elevated and told him to come to the emergency department. In the ED patient again had a syncopal episode while in the stretcher which lasted for a few seconds. The lab work revealed elevated d-dimer of 0.77, unfortunately CT angiogram could not be performed due to the creatinine of 2.53. VQ scan has been ordered by the ED physician. Patient admits to having dizziness when standing from sitting position, but denies any chest pain or shortness of breath. No nausea vomiting or diarrhea. Denies leg pain no leg swelling.  Allergies:  No Known Allergies    Past Medical History  Diagnosis Date  . Hypertension   . Neck pain   . Kidney stone   . Bipolar 1 disorder (HCC)     . Diverticulitis   . Anxiety   . Arthritis   . Sleep apnea   . Chronic headaches   . Stage III chronic kidney disease     Baseline creatinine 1.8  . Left inguinal hernia   . Atherosclerosis   . CAD (coronary artery disease)   . Hiatal hernia 05/06/2015  . Esophagitis versus Mallory-Weiss tear 05/06/2015  . Antritis (stomach) 05/06/2015    Past Surgical History  Procedure Laterality Date  . Appendectomy      69 years old  . Partial colectomy    . Spine surgery  2013  . Anterior cervical decomp/discectomy fusion N/A 12/04/2012    Procedure: ANTERIOR CERVICAL DECOMPRESSION/DISCECTOMY FUSION 2 LEVELS;  Surgeon: Temple Pacini, MD;  Location: [facility];  Service: Neurosurgery;  Laterality: N/A;  Cervical five-six, six-seven anterior cervical decompression/discectomy fusion with allograft and plating  . Esophagogastroduodenoscopy N/A 05/06/2015    Procedure: ESOPHAGOGASTRODUODENOSCOPY (EGD);  Surgeon: Vida Rigger, MD;  Location: [facility];  Service: Endoscopy;  Laterality: N/A;    Prior to Admission medications   Medication Sig Start Date End Date Taking? Authorizing Provider  Aspirin-Salicylamide-Caffeine (BC HEADACHE POWDER PO) Take 1 packet by mouth every 4 (four) hours as needed (for pain).   Yes Historical Provider, MD  FLUoxetine (PROZAC) 20 MG capsule Take 20 mg by mouth daily.   Yes Historical Provider, MD  Glucosamine HCl (GLUCOSAMINE PO) Take 1 tablet by mouth daily.   Yes Historical Provider, MD  lamoTRIgine (LAMICTAL) 200 MG tablet Take 200 mg by mouth  daily.   Yes Historical Provider, MD  lansoprazole (PREVACID) 15 MG capsule Take 1 capsule (15 mg total) by mouth daily at 12 noon. 05/06/15  Yes Maryruth Bun Rama, MD  lisinopril-hydrochlorothiazide (PRINZIDE,ZESTORETIC) 20-12.5 MG per tablet Take 1 tablet by mouth daily.   Yes Historical Provider, MD  metoprolol succinate (TOPROL-XL) 50 MG 24 hr tablet TAKE 1 TABLET BY MOUTH EVERY DAY **NEEDS OV** 07/13/13  Yes Chelle Jeffery,  PA-C  PRESCRIPTION MEDICATION 2 inhalers unknown names - they were samples from dr's office   Yes Historical Provider, MD  simvastatin (ZOCOR) 20 MG tablet Take 20 mg by mouth every evening.   Yes Historical Provider, MD  oxyCODONE-acetaminophen (ROXICET) 5-325 MG per tablet Take 1 tablet by mouth every 4 (four) hours as needed for severe pain. Patient not taking: Reported on 07/28/2015 05/06/15   Maryruth Bun Rama, MD    Social History:  reports that he quit smoking about 31 years ago. He has never used smokeless tobacco. He reports that he does not drink alcohol or use illicit drugs.  Family History  Problem Relation Age of Onset  . Anesthesia problems Neg Hx   . Heart attack Father   . Liver cancer Paternal Grandfather     unsure of other cancers   . Diabetes Sister     [facility] Weights   07/28/15 1254  Weight: 57.607 kg (127 lb)    All the positives are listed in BOLD  Review of Systems:  HEENT: Headache, blurred vision, runny nose, sore throat Neck: Hypothyroidism, hyperthyroidism,,lymphadenopathy Chest : Shortness of breath, history of COPD, Asthma, cough Heart : Chest pain, history of coronary arterey disease GI:  Nausea, vomiting, diarrhea, constipation, GERD GU: Dysuria, urgency, frequency of urination, hematuria Neuro: Stroke, seizures, syncope Psych: Depression, anxiety, hallucinations   Physical Exam: Blood pressure 112/69, pulse 66, temperature 97.7 F (36.5 C), temperature source Oral, resp. rate 20, height  (1.676 m), weight 57.607 kg (127 lb), SpO2 95 %. Constitutional:   Patient is a well-developed and well-nourished male* in no acute distress and cooperative with exam. Head: Normocephalic and atraumatic Mouth: Mucus membranes moist Eyes: PERRL, EOMI, conjunctivae normal Neck: Supple, No Thyromegaly Cardiovascular: RRR, S1 normal, S2 normal Pulmonary/Chest: CTAB, no wheezes, rales, or rhonchi Abdominal: Soft. Non-tender, non-distended, bowel sounds are  normal, no masses, organomegaly, or guarding present.  Neurological: A&O x3, Strength is normal and symmetric bilaterally, cranial nerve II-XII are grossly intact, no focal motor deficit, sensory intact to light touch bilaterally.  Extremities : No Cyanosis, Clubbing or Edema  Labs on Admission:  Basic Metabolic Panel:  Recent Labs Lab 07/28/15 1330  NA 138  K 3.7  CL 107  CO2 22  GLUCOSE 121*  BUN 32*  CREATININE 2.53*  CALCIUM 9.4   CBC:  Recent Labs Lab 07/28/15 1330  WBC 7.4  HGB 13.8  HCT 43.1  MCV 83.0  PLT 377    Radiological Exams on Admission: Dg Chest 2 View  07/28/2015   CLINICAL DATA:  Syncope. Stage III chronic kidney disease. Coronary artery disease.  EXAM: CHEST  2 VIEW  COMPARISON:  07/18/2015  FINDINGS: Atherosclerotic aortic arch. Mild aortic tortuosity. Lower cervical plate and screw fixator.  The lungs appear otherwise clear. No pleural effusion. Prior basilar subsegmental atelectasis has resolved.  IMPRESSION: 1. No acute findings. 2. Atherosclerotic aortic arch with mild aortic tortuosity.   Electronically Signed   By: Gaylyn Rong M.D.   On: 07/28/2015 15:42    EKG: Independently reviewed.  Normal sinus rhythm   Assessment/Plan Active Problems:   Essential hypertension   Stage III chronic kidney disease   Syncope  Syncope ? Cause, patient has elevated d-dimer 0.77, CT angiogram could not be performed due to renal failure. VQ scan has been ordered we'll follow the results.  We'll also check a lateral lower extremity venous duplex to rule out DVT.  Will check echocardiogram, orthostatic vital signs, troponin every 6 hours 3, monitor closely on telemetry, TSH. Will also check CT head.  Other possibilities include cough syncope, vasovagal syncope.  If all the tests inconclusive consider cardiology evaluation for event monitor.  C KD stage III Patient's creatinine is 2.53, at baseline. Will follow BMP in a.m.  Hypertension Hold  HCTZ/lisinopril, continue Toprol-XL  DVT prophylaxis Lovenox   Code status: Full code  Family discussion: No family at bedside   Time Spent on Admission: 60 min  LAMA,GAGAN S [facility] Pager: [phone] 07/28/2015, 6:12 PM  If 7PM-7AM, please contact night-coverage  [email or web address]  Password [email or web address]

## 2015-07-29 ENCOUNTER — Observation Stay (HOSPITAL_BASED_OUTPATIENT_CLINIC_OR_DEPARTMENT_OTHER): Payer: PPO

## 2015-07-29 DIAGNOSIS — N183 Chronic kidney disease, stage 3 (moderate): Secondary | ICD-10-CM | POA: Diagnosis not present

## 2015-07-29 DIAGNOSIS — R55 Syncope and collapse: Secondary | ICD-10-CM

## 2015-07-29 DIAGNOSIS — I10 Essential (primary) hypertension: Secondary | ICD-10-CM | POA: Diagnosis not present

## 2015-07-29 LAB — COMPREHENSIVE METABOLIC PANEL
ALBUMIN: 4.5 g/dL (ref 3.5–5.0)
ALK PHOS: 111 U/L (ref 38–126)
ALT: 23 U/L (ref 17–63)
AST: 22 U/L (ref 15–41)
Anion gap: 13 (ref 5–15)
BUN: 26 mg/dL — AB (ref 6–20)
CHLORIDE: 108 mmol/L (ref 101–111)
CO2: 18 mmol/L — AB (ref 22–32)
CREATININE: 2.03 mg/dL — AB (ref 0.61–1.24)
Calcium: 9.2 mg/dL (ref 8.9–10.3)
GFR calc Af Amer: 36 mL/min — ABNORMAL LOW (ref >60)
GFR calc non Af Amer: 31 mL/min — ABNORMAL LOW (ref >60)
GLUCOSE: 85 mg/dL (ref 65–99)
Potassium: 4.3 mmol/L (ref 3.5–5.1)
SODIUM: 139 mmol/L (ref 135–145)
Total Bilirubin: 0.4 mg/dL (ref 0.3–1.2)
Total Protein: 7.7 g/dL (ref 6.5–8.1)

## 2015-07-29 LAB — CBC
HCT: 41.2 % (ref 39.0–52.0)
HEMOGLOBIN: 13 g/dL (ref 13.0–17.0)
MCH: 26.6 pg (ref 26.0–34.0)
MCHC: 31.6 g/dL (ref 30.0–36.0)
MCV: 84.3 fL (ref 78.0–100.0)
PLATELETS: 329 10*3/uL (ref 150–400)
RBC: 4.89 MIL/uL (ref 4.22–5.81)
RDW: 16 % — ABNORMAL HIGH (ref 11.5–15.5)
WBC: 8.1 10*3/uL (ref 4.0–10.5)

## 2015-07-29 LAB — TROPONIN I

## 2015-07-29 MED ORDER — ENOXAPARIN SODIUM 40 MG/0.4ML ~~LOC~~ SOLN
40.0000 mg | SUBCUTANEOUS | Status: DC
  Administered 2015-07-29: 40 mg via SUBCUTANEOUS
  Filled 2015-07-29: qty 0.4

## 2015-07-29 MED ORDER — SODIUM CHLORIDE 0.45 % IV SOLN
INTRAVENOUS | Status: DC
  Filled 2015-07-29: qty 1000

## 2015-07-29 MED ORDER — HYDROCODONE-ACETAMINOPHEN 5-325 MG PO TABS
1.0000 | ORAL_TABLET | Freq: Four times a day (QID) | ORAL | Status: DC | PRN
  Administered 2015-07-29 – 2015-07-30 (×3): 1 via ORAL
  Filled 2015-07-29 (×3): qty 1

## 2015-07-29 MED ORDER — SODIUM CHLORIDE 0.45 % IV SOLN
INTRAVENOUS | Status: DC
  Administered 2015-07-29 (×2): via INTRAVENOUS

## 2015-07-29 NOTE — Progress Notes (Signed)
Paged MD per pt request for prn pain med other than Tylenol, response pending

## 2015-07-29 NOTE — Progress Notes (Signed)
[facility] PROGRESS NOTE  [name] ZOX:[id number] DOB: 1946-01-11 DOA: 07/28/2015 PCP: Emeterio Reeve, MD  Assessment/Plan: 1. Syncope- ? Cause, d-dimer 0.77, VQ scan low probability for pulmonary embolism. Lower extremity venous duplex negative done, preliminary results negative for DVT. 2-D echo has been done, results are pending. Cardiac enzymes 2 negative. Telemetry shows normal sinus rhythm. Orthostatics vital signs are negative. Patient takes Zestoretic at home which is on hold. 2. Hypertension- blood pressure is controlled, continue metoprolol 50 mg daily,zestoretic on hold. 3. Acute on C KD stage III- creatinine is slowly improving, this morning BUN/creatinine 26/2.03.  Code Status: Full code Family Communication: No family at bedside Disposition Plan:  Home when medically stable   Consultants:  None  Procedures:  None  Antibiotics:  None  HPI/Subjective: 69 year old male was brought to the ED for syncopal episodes. Patient says that he went on a cruise 2 weeks ago and traveled to [facility] medical and by car which was 6-7 hours dry. Patient says that he had his first episode of syncope at the close while eating lunch. Did not have any chest pain, no dizziness no shortness of breath. Though he says that he had some coughing at that time. When patient came back from the cruise he had another syncopal episode while sitting in toilet. Patient was diagnosed with pneumonia by his PCP and was started on antibiotic when his cough improved. Patient had his third syncopal episode 2 days ago when he was sitting at home. He does not remember any prodromal symptoms before that episode. Today his PCP called and told patient that potassium was elevated and told him to come to the emergency department. In the ED patient again had a syncopal episode while in the stretcher which lasted for a few seconds. The lab work revealed elevated d-dimer of 0.77, unfortunately CT angiogram  could not be performed due to the creatinine of 2.53.VQ scan was done which was low probability for pulmonary embolism.  This morning patient denies any complaints. No chest pain shortness of breath. No syncope episodes in the hospital.  Objective: Filed Vitals:   07/29/15 0900  BP: 137/90  Pulse: 83  Temp: 97.7 F (36.5 C)  Resp:     Intake/Output Summary (Last 24 hours) at 07/29/15 1326 Last data filed at 07/29/15 0900  Gross per 24 hour  Intake    240 ml  Output      0 ml  Net    240 ml   Filed Weights   07/28/15 1254 07/28/15 1827  Weight: 57.607 kg (127 lb) 100.699 kg (222 lb)    Exam:   General:  Appears in no acute distress     Cardiovascular:  S1-S2 regular   Respiratory: clear to auscultation bilaterally  Abdomen:  Soft, nontender, no organomegaly  Musculoskeletal:  No cyanosis/clubbing/edema of lower extremities   Data Reviewed: Basic Metabolic Panel:  Recent Labs Lab 07/28/15 1330 07/29/15 0709  NA 138 139  K 3.7 4.3  CL 107 108  CO2 22 18*  GLUCOSE 121* 85  BUN 32* 26*  CREATININE 2.53* 2.03*  CALCIUM 9.4 9.2   Liver Function Tests:  Recent Labs Lab 07/29/15 0709  AST 22  ALT 23  ALKPHOS 111  BILITOT 0.4  PROT 7.7  ALBUMIN 4.5   CBC:  Recent Labs Lab 07/28/15 1330 07/29/15 0709  WBC 7.4 8.1  HGB 13.8 13.0  HCT 43.1 41.2  MCV 83.0 84.3  PLT 377 329   Cardiac Enzymes:  Recent  Labs Lab 07/28/15 1922 07/29/15 0050 07/29/15 0709  TROPONINI <0.03 <0.03 <0.03    Studies: Dg Chest 2 View  07/28/2015   CLINICAL DATA:  Syncope. Stage III chronic kidney disease. Coronary artery disease.  EXAM: CHEST  2 VIEW  COMPARISON:  07/18/2015  FINDINGS: Atherosclerotic aortic arch. Mild aortic tortuosity. Lower cervical plate and screw fixator.  The lungs appear otherwise clear. No pleural effusion. Prior basilar subsegmental atelectasis has resolved.  IMPRESSION: 1. No acute findings. 2. Atherosclerotic aortic arch with mild aortic  tortuosity.   Electronically Signed   By: Gaylyn Rong M.D.   On: 07/28/2015 15:42   Ct Head Wo Contrast  07/28/2015   CLINICAL DATA:  Syncope.  EXAM: CT HEAD WITHOUT CONTRAST  TECHNIQUE: Contiguous axial images were obtained from the base of the skull through the vertex without intravenous contrast.  COMPARISON:  CT scan of August 23, 2013.  FINDINGS: Bony calvarium appears intact. Mild diffuse cortical atrophy is noted. Minimal chronic ischemic white matter disease is noted. Old lacunar infarction is noted in left basal ganglia. No mass effect or midline shift is noted. Ventricular size is within normal limits. There is no evidence of mass lesion, hemorrhage or acute infarction.  IMPRESSION: Mild diffuse cortical atrophy. Minimal chronic ischemic white matter disease. No acute intracranial abnormality seen.   Electronically Signed   By: Lupita Raider, M.D.   On: 07/28/2015 19:23   Nm Pulmonary Perf And Vent  07/28/2015   CLINICAL DATA:  Syncope.  Elevated D-dimer level.  EXAM: NUCLEAR MEDICINE VENTILATION - PERFUSION LUNG SCAN  TECHNIQUE: Ventilation images were obtained in multiple projections using inhaled aerosol Tc-82m DTPA. Perfusion images were obtained in multiple projections after intravenous injection of Tc-59m MAA.  RADIOPHARMACEUTICALS:  29.6 Technetium-93m DTPA aerosol inhalation and 4.3 Technetium-3m MAA IV  COMPARISON:  Chest radiograph of same day.  FINDINGS: Ventilation: No focal ventilation defect.  Perfusion: No wedge shaped peripheral perfusion defects to suggest acute pulmonary embolism.  IMPRESSION: No definite perfusion defects are seen to suggest pulmonary embolus.   Electronically Signed   By: Lupita Raider, M.D.   On: 07/28/2015 19:10    Scheduled Meds: . sodium chloride   Intravenous STAT  . enoxaparin (LOVENOX) injection  40 mg Subcutaneous Q24H  . FLUoxetine  20 mg Oral Daily  . lamoTRIgine  200 mg Oral Daily  . metoprolol succinate  50 mg Oral Daily  .  pantoprazole  40 mg Oral Daily  . simvastatin  20 mg Oral QPM  . sodium chloride  3 mL Intravenous Q12H   Continuous Infusions:   Active Problems:   Essential hypertension   Stage III chronic kidney disease   Syncope    Time spent: 25 min    [facility] S  [facility] Pager [phone]. If 7PM-7AM, please contact night-coverage at [email or web address], password [facility] 07/29/2015, 1:26 PM  LOS: 1 day

## 2015-07-29 NOTE — Progress Notes (Signed)
  Echocardiogram 2D Echocardiogram has been performed.  Jordan [name] 07/29/2015, 8:55 AM

## 2015-07-29 NOTE — Progress Notes (Signed)
*  Preliminary Results* Bilateral lower extremity venous duplex completed. There is no obvious evidence of deep vein thrombosis involving the visualized veins of bilateral lower extremities. Unable to adequately evaluate the left distal femoral vein secondary to poor patient cooperation and guarding, therefore cannot definitively exclude deep vein thrombosis in this segment.   07/29/2015  Gertie Fey, RVT, RDCS, RDMS

## 2015-07-30 ENCOUNTER — Other Ambulatory Visit: Payer: Self-pay | Admitting: Student

## 2015-07-30 DIAGNOSIS — I10 Essential (primary) hypertension: Secondary | ICD-10-CM | POA: Diagnosis not present

## 2015-07-30 DIAGNOSIS — N183 Chronic kidney disease, stage 3 (moderate): Secondary | ICD-10-CM | POA: Diagnosis not present

## 2015-07-30 DIAGNOSIS — R55 Syncope and collapse: Secondary | ICD-10-CM

## 2015-07-30 LAB — BASIC METABOLIC PANEL
ANION GAP: 7 (ref 5–15)
BUN: 23 mg/dL — ABNORMAL HIGH (ref 6–20)
CALCIUM: 8.9 mg/dL (ref 8.9–10.3)
CO2: 24 mmol/L (ref 22–32)
Chloride: 108 mmol/L (ref 101–111)
Creatinine, Ser: 1.9 mg/dL — ABNORMAL HIGH (ref 0.61–1.24)
GFR calc Af Amer: 39 mL/min — ABNORMAL LOW (ref >60)
GFR, EST NON AFRICAN AMERICAN: 34 mL/min — AB (ref >60)
Glucose, Bld: 96 mg/dL (ref 65–99)
POTASSIUM: 4.5 mmol/L (ref 3.5–5.1)
SODIUM: 139 mmol/L (ref 135–145)

## 2015-07-30 MED ORDER — LISINOPRIL-HYDROCHLOROTHIAZIDE 10-12.5 MG PO TABS: 1.0000 | ORAL_TABLET | Freq: Every day | ORAL | Status: DC

## 2015-07-30 MED ORDER — HYDROCHLOROTHIAZIDE 12.5 MG PO CAPS
12.5000 mg | ORAL_CAPSULE | Freq: Every day | ORAL | Status: DC
  Administered 2015-07-30: 12.5 mg via ORAL
  Filled 2015-07-30: qty 1

## 2015-07-30 NOTE — Discharge Summary (Signed)
Physician Discharge Summary  Jordan [name] [facility] ZOX:[id number] DOB: November 27, 1945 DOA: 07/28/2015  PCP: Emeterio Reeve, MD  Admit date: 07/28/2015 Discharge date: 07/30/2015  Time spent: 25 min* minutes  Recommendations for Outpatient Follow-up:  1. Follow up PCP in 2 weeks  Discharge Diagnoses:  Active Problems:   Essential hypertension   Stage III chronic kidney disease   Syncope   Discharge Condition: Stable  Diet recommendation: Low salt diet  Filed Weights   07/28/15 1254 07/28/15 1827  Weight: 57.607 kg (127 lb) 100.699 kg (222 lb)    History of present illness:  69 year old male was brought to the ED for syncopal episodes. Patient says that he went on a cruise 2 weeks ago and traveled to [facility] medical and by car which was 6-7 hours dry. Patient says that he had his first episode of syncope at the close while eating lunch. Did not have any chest pain, no dizziness no shortness of breath. Though he says that he had some coughing at that time. When patient came back from the cruise he had another syncopal episode while sitting in toilet. Patient was diagnosed with pneumonia by his PCP and was started on antibiotic when his cough improved. Patient had his third syncopal episode 2 days ago when he was sitting at home. He does not remember any prodromal symptoms before that episode. Today his PCP called and told patient that potassium was elevated and told him to come to the emergency department. In the ED patient again had a syncopal episode while in the stretcher which lasted for a few seconds. The lab work revealed elevated d-dimer of 0.77, unfortunately CT angiogram could not be performed due to the creatinine of 2.53.VQ scan was done which was low probability for pulmonary embolism.  Hospital Course:  1. Syncope- resolved, ? cause, d-dimer 0.77, VQ scan low probability for pulmonary embolism. Lower extremity venous duplex negative done, preliminary results negative for DVT. 2-D echo  has been done showed grade 1 diastolic dysfunction with EF 65-70%. Cardiac enzymes 2 negative. Telemetry shows normal sinus rhythm. Orthostatics vital signs are negative. Patient takes Zestoretic at home which was held, will cut down the dose of Zestoretic to 10/12.5 mg po daily. Have called cardiology to set up event monitor as outpatient. 2. Hypertension- blood pressure is controlled, continue metoprolol 50 mg daily, cut down the dose of Zestoretic to 10/12/5 mg po daily. 3. Acute on C KD stage III- creatinine improved with IV fluids, this morning BUN/creatinine 23/1.90  Procedures:   echocardiogram   Consultations:  None  Discharge Exam: Filed Vitals:   07/30/15 0544  BP: 135/93  Pulse: 67  Temp: 97.6 F (36.4 C)  Resp: 18    General: Appears in no acute distress Cardiovascular: S1S2 RRR Respiratory: Clear bilaterally  Discharge Instructions   Discharge Instructions    Diet - low sodium heart healthy    Complete by:  As directed      Discharge instructions    Complete by:  As directed   Cardiology office will call you to set up the Event     Increase activity slowly    Complete by:  As directed           Current Discharge Medication List    START taking these medications   Details  lisinopril-hydrochlorothiazide (ZESTORETIC) 10-12.5 MG tablet Take 1 tablet by mouth daily. Qty: 30 tablet, Refills: 2      CONTINUE these medications which have NOT CHANGED   Details  Aspirin-Salicylamide-Caffeine (  BC HEADACHE POWDER PO) Take 1 packet by mouth every 4 (four) hours as needed (for pain).    FLUoxetine (PROZAC) 20 MG capsule Take 20 mg by mouth daily.    Glucosamine HCl (GLUCOSAMINE PO) Take 1 tablet by mouth daily.    lamoTRIgine (LAMICTAL) 200 MG tablet Take 200 mg by mouth daily.    lansoprazole (PREVACID) 15 MG capsule Take 1 capsule (15 mg total) by mouth daily at 12 noon. Qty: 30 capsule, Refills: 0    metoprolol succinate (TOPROL-XL) 50 MG 24 hr tablet  TAKE 1 TABLET BY MOUTH EVERY DAY **NEEDS OV** Qty: 15 tablet, Refills: 0    PRESCRIPTION MEDICATION 2 inhalers unknown names - they were samples from dr's office    simvastatin (ZOCOR) 20 MG tablet Take 20 mg by mouth every evening.    oxyCODONE-acetaminophen (ROXICET) 5-325 MG per tablet Take 1 tablet by mouth every 4 (four) hours as needed for severe pain. Qty: 30 tablet, Refills: 0      STOP taking these medications     lisinopril-hydrochlorothiazide (PRINZIDE,ZESTORETIC) 20-12.5 MG per tablet        No Known Allergies    The results of significant diagnostics from this hospitalization (including imaging, microbiology, ancillary and laboratory) are listed below for reference.    Significant Diagnostic Studies: Dg Chest 2 View  07/28/2015   CLINICAL DATA:  Syncope. Stage III chronic kidney disease. Coronary artery disease.  EXAM: CHEST  2 VIEW  COMPARISON:  07/18/2015  FINDINGS: Atherosclerotic aortic arch. Mild aortic tortuosity. Lower cervical plate and screw fixator.  The lungs appear otherwise clear. No pleural effusion. Prior basilar subsegmental atelectasis has resolved.  IMPRESSION: 1. No acute findings. 2. Atherosclerotic aortic arch with mild aortic tortuosity.   Electronically Signed   By: Gaylyn Rong M.D.   On: 07/28/2015 15:42   Dg Chest 2 View  07/18/2015   CLINICAL DATA:  Three days of cough and wheezing, history of coronary artery disease and previous tobacco use.  EXAM: CHEST  2 VIEW  COMPARISON:  PA and lateral chest x-ray of December 01, 2012  FINDINGS: The lungs are adequately inflated. Subtle increased density is present in the right lower lobe and left infrahilar region. The heart and pulmonary vascularity are normal. The mediastinum is normal in width. The trachea is midline. The bony thorax is unremarkable.  IMPRESSION: Subsegmental atelectasis or early pneumonia in the right lower lobe and left infrahilar region. Followup PA and lateral chest X-ray is  recommended in 3-4 weeks following trial of antibiotic therapy to ensure resolution and exclude underlying malignancy.   Electronically Signed   By: David  [place] M.D.   On: 07/18/2015 14:48   Ct Head Wo Contrast  07/28/2015   CLINICAL DATA:  Syncope.  EXAM: CT HEAD WITHOUT CONTRAST  TECHNIQUE: Contiguous axial images were obtained from the base of the skull through the vertex without intravenous contrast.  COMPARISON:  CT scan of August 23, 2013.  FINDINGS: Bony calvarium appears intact. Mild diffuse cortical atrophy is noted. Minimal chronic ischemic white matter disease is noted. Old lacunar infarction is noted in left basal ganglia. No mass effect or midline shift is noted. Ventricular size is within normal limits. There is no evidence of mass lesion, hemorrhage or acute infarction.  IMPRESSION: Mild diffuse cortical atrophy. Minimal chronic ischemic white matter disease. No acute intracranial abnormality seen.   Electronically Signed   By: Lupita Raider, M.D.   On: 07/28/2015 19:23   Nm Pulmonary Perf  And Vent  07/28/2015   CLINICAL DATA:  Syncope.  Elevated D-dimer level.  EXAM: NUCLEAR MEDICINE VENTILATION - PERFUSION LUNG SCAN  TECHNIQUE: Ventilation images were obtained in multiple projections using inhaled aerosol Tc-33m DTPA. Perfusion images were obtained in multiple projections after intravenous injection of Tc-56m MAA.  RADIOPHARMACEUTICALS:  29.6 Technetium-47m DTPA aerosol inhalation and 4.3 Technetium-17m MAA IV  COMPARISON:  Chest radiograph of same day.  FINDINGS: Ventilation: No focal ventilation defect.  Perfusion: No wedge shaped peripheral perfusion defects to suggest acute pulmonary embolism.  IMPRESSION: No definite perfusion defects are seen to suggest pulmonary embolus.   Electronically Signed   By: Lupita Raider, M.D.   On: 07/28/2015 19:10    Microbiology: No results found for this or any previous visit (from the past 240 hour(s)).   Labs: Basic Metabolic  Panel:  Recent Labs Lab 07/28/15 1330 07/29/15 0709 07/30/15 0503  NA 138 139 139  K 3.7 4.3 4.5  CL 107 108 108  CO2 22 18* 24  GLUCOSE 121* 85 96  BUN 32* 26* 23*  CREATININE 2.53* 2.03* 1.90*  CALCIUM 9.4 9.2 8.9   Liver Function Tests:  Recent Labs Lab 07/29/15 0709  AST 22  ALT 23  ALKPHOS 111  BILITOT 0.4  PROT 7.7  ALBUMIN 4.5   No results for input(s): LIPASE, AMYLASE in the last 168 hours. No results for input(s): AMMONIA in the last 168 hours. CBC:  Recent Labs Lab 07/28/15 1330 07/29/15 0709  WBC 7.4 8.1  HGB 13.8 13.0  HCT 43.1 41.2  MCV 83.0 84.3  PLT 377 329   Cardiac Enzymes:  Recent Labs Lab 07/28/15 1922 07/29/15 0050 07/29/15 0709  TROPONINI <0.03 <0.03 <0.03   BNP: BNP (last 3 results) No results for input(s): BNP in the last 8760 hours.  ProBNP (last 3 results) No results for input(s): PROBNP in the last 8760 hours.  CBG: No results for input(s): GLUCAP in the last 168 hours.     SignedMauro Kaufmann S  [facility] 07/30/2015, 9:42 AM

## 2015-07-30 NOTE — Progress Notes (Signed)
Discharge instructions reviewed with patient utilizing teach back method no questions at this time. Patient being discharged to home  

## 2015-08-01 ENCOUNTER — Ambulatory Visit (INDEPENDENT_AMBULATORY_CARE_PROVIDER_SITE_OTHER): Payer: PPO

## 2015-08-01 DIAGNOSIS — R55 Syncope and collapse: Secondary | ICD-10-CM | POA: Diagnosis not present

## 2015-08-30 ENCOUNTER — Ambulatory Visit
Admission: RE | Admit: 2015-08-30 | Discharge: 2015-08-30 | Disposition: A | Discharge status: Home / Self Care | Payer: PPO | Source: Ambulatory Visit | Attending: Family Medicine | Admitting: Family Medicine

## 2015-08-30 ENCOUNTER — Other Ambulatory Visit: Payer: Self-pay | Admitting: Family Medicine

## 2015-08-30 DIAGNOSIS — J690 Pneumonitis due to inhalation of food and vomit: Secondary | ICD-10-CM

## 2015-09-05 ENCOUNTER — Encounter: Payer: Self-pay | Admitting: Neurology

## 2015-09-05 ENCOUNTER — Ambulatory Visit (INDEPENDENT_AMBULATORY_CARE_PROVIDER_SITE_OTHER): Payer: PPO | Admitting: Neurology

## 2015-09-05 VITALS — BP 124/98 | HR 113 | Resp 18 | Wt 225.0 lb

## 2015-09-05 DIAGNOSIS — R55 Syncope and collapse: Secondary | ICD-10-CM | POA: Diagnosis not present
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
## 2015-09-05 NOTE — Patient Instructions (Signed)
1. Schedule MRI brain with and without contrast 2. Schedule routine EEG 3. Our office will call you with results, if no significant changes, follow-up on as needed basis 4. As per [place] driving laws, after an episode of loss of consciousness, one should not drive until 6 months event-free

## 2015-09-18 ENCOUNTER — Ambulatory Visit (HOSPITAL_COMMUNITY)
Admission: RE | Admit: 2015-09-18 | Discharge: 2015-09-18 | Disposition: A | Discharge status: Home / Self Care | Payer: PPO | Source: Ambulatory Visit | Attending: Neurology | Admitting: Neurology

## 2015-09-18 DIAGNOSIS — H0520 Unspecified exophthalmos: Secondary | ICD-10-CM | POA: Diagnosis not present

## 2015-09-18 DIAGNOSIS — I679 Cerebrovascular disease, unspecified: Secondary | ICD-10-CM | POA: Diagnosis not present

## 2015-09-18 DIAGNOSIS — J3489 Other specified disorders of nose and nasal sinuses: Secondary | ICD-10-CM | POA: Diagnosis not present

## 2015-09-18 DIAGNOSIS — G3189 Other specified degenerative diseases of nervous system: Secondary | ICD-10-CM | POA: Insufficient documentation

## 2015-09-18 DIAGNOSIS — Z0189 Encounter for other specified special examinations: Secondary | ICD-10-CM | POA: Diagnosis present

## 2015-09-18 DIAGNOSIS — R55 Syncope and collapse: Secondary | ICD-10-CM | POA: Insufficient documentation

## 2015-09-18 LAB — POCT I-STAT CREATININE: CREATININE: 2.1 mg/dL — AB (ref 0.61–1.24)

## 2015-09-19 ENCOUNTER — Telehealth: Payer: Self-pay | Admitting: Family Medicine

## 2015-09-19 NOTE — Telephone Encounter (Signed)
-----   Message from Van ClinesKaren M Aquino, MD sent at 09/19/2015  8:44 AM EST ----- Pls let him know I reviewed MRI brain and it is unremarkable, no evidence of tumor, stroke, or bleed. His kidney function test is still abnormal, similar to prior results, continue follow-up with his PCP and nephrologist. Thanks

## 2015-09-19 NOTE — Telephone Encounter (Signed)
Lmovm to rtn my call. 

## 2015-09-20 NOTE — Telephone Encounter (Signed)
Called patient again and spoke with him. Notified of result and advisement. Will fax results to his pcp and Dr. Eliott Nineunham his nephrologist.

## 2015-09-21 ENCOUNTER — Ambulatory Visit (INDEPENDENT_AMBULATORY_CARE_PROVIDER_SITE_OTHER): Payer: PPO | Admitting: Neurology

## 2015-09-21 DIAGNOSIS — R55 Syncope and collapse: Secondary | ICD-10-CM | POA: Diagnosis not present

## 2015-09-28 ENCOUNTER — Telehealth: Payer: Self-pay | Admitting: Family Medicine

## 2015-09-28 NOTE — Telephone Encounter (Signed)
-----   Message from Van ClinesKaren M Aquino, MD sent at 09/28/2015  9:14 AM EST ----- Pls let him know EEG showed normal brain waves. Thanks

## 2015-09-28 NOTE — Telephone Encounter (Signed)
Lmovm to rtn my call. 

## 2015-09-28 NOTE — Procedures (Signed)
ELECTROENCEPHALOGRAM REPORT  Date of Study: 09/21/2015  Patient's Name: Marcelo BaldyLarry W Ghan MRN: [id number] Date of Birth: 24-Apr-1946  Referring Provider: Dr. Patrcia DollyKaren Quida Glasser  Clinical History: This is a 69 year old man with episodes of loss of consciousness.  Medications:  Lamictal, ASA, Glucosamine, HCl, Prevacid, Toprol XL  Technical Summary: A multichannel digital EEG recording measured by the international 10-20 system with electrodes applied with paste and impedances below 5000 ohms performed in our laboratory with EKG monitoring in a predominantly drowsy and asleep patient.  Hyperventilation and photic stimulation were performed.  The digital EEG was referentially recorded, reformatted, and digitally filtered in a variety of bipolar and referential montages for optimal display.    Description: The patient is predominantly drowsy and asleep during the recording.  During brief period of wakefulness, there is a poorly sustained 8 Hz posterior dominant rhythm that poorly attenuates to eye opening and eye closure. The record is symmetric.  During drowsiness and sleep, there is an increase in theta slowing of the background.  Vertex waves and symmetric sleep spindles were seen.  Hyperventilation and photic stimulation did not elicit any abnormalities.  There were no epileptiform discharges or electrographic seizures seen.    EKG lead was unremarkable.  Impression: This predominantly drowsy and asleep EEG is normal.    Clinical Correlation: A normal EEG does not exclude a clinical diagnosis of epilepsy.  If further clinical questions remain, prolonged EEG may be helpful.  Clinical correlation is advised.   Patrcia DollyKaren Roney Youtz, M.D.

## 2015-10-02 NOTE — Telephone Encounter (Signed)
Returned call see 12/1 phone note.

## 2015-10-03 NOTE — Telephone Encounter (Signed)
Note on 12/12 made in error.

## 2015-10-03 NOTE — Telephone Encounter (Signed)
Patient was notified of EEG results.

## 2015-10-08 ENCOUNTER — Inpatient Hospital Stay (HOSPITAL_COMMUNITY)
Admission: EM | Admit: 2015-10-08 | Discharge: 2015-10-25 | DRG: 216 | Disposition: A | Discharge status: Skilled Nursing Facility | Payer: PPO | Attending: Surgery | Admitting: Surgery

## 2015-10-08 ENCOUNTER — Emergency Department (HOSPITAL_COMMUNITY): Payer: PPO

## 2015-10-08 ENCOUNTER — Encounter (HOSPITAL_COMMUNITY): Payer: Self-pay

## 2015-10-08 DIAGNOSIS — G4730 Sleep apnea, unspecified: Secondary | ICD-10-CM | POA: Diagnosis present

## 2015-10-08 DIAGNOSIS — N179 Acute kidney failure, unspecified: Secondary | ICD-10-CM | POA: Diagnosis not present

## 2015-10-08 DIAGNOSIS — Z981 Arthrodesis status: Secondary | ICD-10-CM

## 2015-10-08 DIAGNOSIS — E663 Overweight: Secondary | ICD-10-CM | POA: Diagnosis present

## 2015-10-08 DIAGNOSIS — Y846 Urinary catheterization as the cause of abnormal reaction of the patient, or of later complication, without mention of misadventure at the time of the procedure: Secondary | ICD-10-CM | POA: Diagnosis not present

## 2015-10-08 DIAGNOSIS — I081 Rheumatic disorders of both mitral and tricuspid valves: Secondary | ICD-10-CM | POA: Diagnosis present

## 2015-10-08 DIAGNOSIS — I9789 Other postprocedural complications and disorders of the circulatory system, not elsewhere classified: Secondary | ICD-10-CM | POA: Diagnosis present

## 2015-10-08 DIAGNOSIS — I272 Other secondary pulmonary hypertension: Secondary | ICD-10-CM | POA: Diagnosis present

## 2015-10-08 DIAGNOSIS — I509 Heart failure, unspecified: Secondary | ICD-10-CM

## 2015-10-08 DIAGNOSIS — M1990 Unspecified osteoarthritis, unspecified site: Secondary | ICD-10-CM | POA: Diagnosis present

## 2015-10-08 DIAGNOSIS — I25110 Atherosclerotic heart disease of native coronary artery with unstable angina pectoris: Secondary | ICD-10-CM | POA: Diagnosis not present

## 2015-10-08 DIAGNOSIS — Z951 Presence of aortocoronary bypass graft: Secondary | ICD-10-CM

## 2015-10-08 DIAGNOSIS — R079 Chest pain, unspecified: Secondary | ICD-10-CM | POA: Diagnosis present

## 2015-10-08 DIAGNOSIS — I5021 Acute systolic (congestive) heart failure: Secondary | ICD-10-CM | POA: Diagnosis present

## 2015-10-08 DIAGNOSIS — J90 Pleural effusion, not elsewhere classified: Secondary | ICD-10-CM

## 2015-10-08 DIAGNOSIS — E876 Hypokalemia: Secondary | ICD-10-CM | POA: Diagnosis not present

## 2015-10-08 DIAGNOSIS — G8929 Other chronic pain: Secondary | ICD-10-CM | POA: Diagnosis present

## 2015-10-08 DIAGNOSIS — I249 Acute ischemic heart disease, unspecified: Secondary | ICD-10-CM

## 2015-10-08 DIAGNOSIS — I480 Paroxysmal atrial fibrillation: Secondary | ICD-10-CM | POA: Diagnosis present

## 2015-10-08 DIAGNOSIS — Z8249 Family history of ischemic heart disease and other diseases of the circulatory system: Secondary | ICD-10-CM | POA: Diagnosis not present

## 2015-10-08 DIAGNOSIS — I214 Non-ST elevation (NSTEMI) myocardial infarction: Secondary | ICD-10-CM | POA: Diagnosis present

## 2015-10-08 DIAGNOSIS — N184 Chronic kidney disease, stage 4 (severe): Secondary | ICD-10-CM | POA: Diagnosis present

## 2015-10-08 DIAGNOSIS — E785 Hyperlipidemia, unspecified: Secondary | ICD-10-CM | POA: Diagnosis present

## 2015-10-08 DIAGNOSIS — I5031 Acute diastolic (congestive) heart failure: Secondary | ICD-10-CM | POA: Diagnosis not present

## 2015-10-08 DIAGNOSIS — Z79899 Other long term (current) drug therapy: Secondary | ICD-10-CM

## 2015-10-08 DIAGNOSIS — M4722 Other spondylosis with radiculopathy, cervical region: Secondary | ICD-10-CM | POA: Diagnosis present

## 2015-10-08 DIAGNOSIS — I130 Hypertensive heart and chronic kidney disease with heart failure and stage 1 through stage 4 chronic kidney disease, or unspecified chronic kidney disease: Secondary | ICD-10-CM | POA: Diagnosis present

## 2015-10-08 DIAGNOSIS — I512 Rupture of papillary muscle, not elsewhere classified: Secondary | ICD-10-CM | POA: Diagnosis present

## 2015-10-08 DIAGNOSIS — N183 Chronic kidney disease, stage 3 unspecified: Secondary | ICD-10-CM | POA: Diagnosis present

## 2015-10-08 DIAGNOSIS — D62 Acute posthemorrhagic anemia: Secondary | ICD-10-CM | POA: Diagnosis not present

## 2015-10-08 DIAGNOSIS — F319 Bipolar disorder, unspecified: Secondary | ICD-10-CM | POA: Diagnosis present

## 2015-10-08 DIAGNOSIS — R0602 Shortness of breath: Secondary | ICD-10-CM

## 2015-10-08 DIAGNOSIS — I4891 Unspecified atrial fibrillation: Secondary | ICD-10-CM | POA: Diagnosis not present

## 2015-10-08 DIAGNOSIS — I248 Other forms of acute ischemic heart disease: Secondary | ICD-10-CM | POA: Diagnosis present

## 2015-10-08 DIAGNOSIS — Z6834 Body mass index (BMI) 34.0-34.9, adult: Secondary | ICD-10-CM | POA: Diagnosis not present

## 2015-10-08 DIAGNOSIS — I2510 Atherosclerotic heart disease of native coronary artery without angina pectoris: Secondary | ICD-10-CM | POA: Diagnosis present

## 2015-10-08 DIAGNOSIS — E1122 Type 2 diabetes mellitus with diabetic chronic kidney disease: Secondary | ICD-10-CM | POA: Diagnosis present

## 2015-10-08 DIAGNOSIS — J9811 Atelectasis: Secondary | ICD-10-CM | POA: Diagnosis not present

## 2015-10-08 DIAGNOSIS — I5041 Acute combined systolic (congestive) and diastolic (congestive) heart failure: Secondary | ICD-10-CM | POA: Diagnosis not present

## 2015-10-08 DIAGNOSIS — T884XXA Failed or difficult intubation, initial encounter: Secondary | ICD-10-CM

## 2015-10-08 DIAGNOSIS — I361 Nonrheumatic tricuspid (valve) insufficiency: Secondary | ICD-10-CM | POA: Diagnosis not present

## 2015-10-08 DIAGNOSIS — I110 Hypertensive heart disease with heart failure: Secondary | ICD-10-CM | POA: Diagnosis not present

## 2015-10-08 DIAGNOSIS — R0902 Hypoxemia: Secondary | ICD-10-CM | POA: Diagnosis not present

## 2015-10-08 DIAGNOSIS — K5900 Constipation, unspecified: Secondary | ICD-10-CM | POA: Diagnosis not present

## 2015-10-08 DIAGNOSIS — Z952 Presence of prosthetic heart valve: Secondary | ICD-10-CM

## 2015-10-08 DIAGNOSIS — T8384XA Pain from genitourinary prosthetic devices, implants and grafts, initial encounter: Secondary | ICD-10-CM | POA: Diagnosis not present

## 2015-10-08 DIAGNOSIS — I25118 Atherosclerotic heart disease of native coronary artery with other forms of angina pectoris: Secondary | ICD-10-CM | POA: Diagnosis not present

## 2015-10-08 DIAGNOSIS — I340 Nonrheumatic mitral (valve) insufficiency: Secondary | ICD-10-CM | POA: Diagnosis not present

## 2015-10-08 DIAGNOSIS — I071 Rheumatic tricuspid insufficiency: Secondary | ICD-10-CM | POA: Diagnosis present

## 2015-10-08 LAB — CBC
HCT: 46 % (ref 39.0–52.0)
HEMOGLOBIN: 15.1 g/dL (ref 13.0–17.0)
MCH: 28.4 pg (ref 26.0–34.0)
MCHC: 32.8 g/dL (ref 30.0–36.0)
MCV: 86.6 fL (ref 78.0–100.0)
PLATELETS: 320 10*3/uL (ref 150–400)
RBC: 5.31 MIL/uL (ref 4.22–5.81)
RDW: 17.7 % — ABNORMAL HIGH (ref 11.5–15.5)
WBC: 12.3 10*3/uL — ABNORMAL HIGH (ref 4.0–10.5)

## 2015-10-08 LAB — BASIC METABOLIC PANEL
ANION GAP: 12 (ref 5–15)
BUN: 45 mg/dL — ABNORMAL HIGH (ref 6–20)
CALCIUM: 9.9 mg/dL (ref 8.9–10.3)
CO2: 21 mmol/L — ABNORMAL LOW (ref 22–32)
CREATININE: 2.22 mg/dL — AB (ref 0.61–1.24)
Chloride: 108 mmol/L (ref 101–111)
GFR, EST AFRICAN AMERICAN: 33 mL/min — AB (ref >60)
GFR, EST NON AFRICAN AMERICAN: 28 mL/min — AB (ref >60)
Glucose, Bld: 135 mg/dL — ABNORMAL HIGH (ref 65–99)
Potassium: 3.8 mmol/L (ref 3.5–5.1)
SODIUM: 141 mmol/L (ref 135–145)

## 2015-10-08 LAB — PROTIME-INR
INR: 1.07 (ref 0.00–1.49)
Prothrombin Time: 14.1 seconds (ref 11.6–15.2)

## 2015-10-08 LAB — I-STAT CHEM 8, ED
BUN: 44 mg/dL — AB (ref 6–20)
CALCIUM ION: 1.23 mmol/L (ref 1.13–1.30)
Chloride: 107 mmol/L (ref 101–111)
Creatinine, Ser: 2.1 mg/dL — ABNORMAL HIGH (ref 0.61–1.24)
GLUCOSE: 130 mg/dL — AB (ref 65–99)
HCT: 51 % (ref 39.0–52.0)
Hemoglobin: 17.3 g/dL — ABNORMAL HIGH (ref 13.0–17.0)
Potassium: 3.7 mmol/L (ref 3.5–5.1)
SODIUM: 141 mmol/L (ref 135–145)
TCO2: 20 mmol/L (ref 0–100)

## 2015-10-08 LAB — APTT: aPTT: 26 seconds (ref 24–37)

## 2015-10-08 LAB — I-STAT TROPONIN, ED: TROPONIN I, POC: 5.16 ng/mL — AB (ref 0.00–0.08)

## 2015-10-08 LAB — D-DIMER, QUANTITATIVE: D-Dimer, Quant: 1.75 ug/mL-FEU — ABNORMAL HIGH (ref 0.00–0.50)

## 2015-10-08 LAB — BRAIN NATRIURETIC PEPTIDE: B NATRIURETIC PEPTIDE 5: 1513.6 pg/mL — AB (ref 0.0–100.0)

## 2015-10-08 MED ORDER — METOPROLOL TARTRATE 1 MG/ML IV SOLN
5.0000 mg | Freq: Once | INTRAVENOUS | Status: AC
  Administered 2015-10-08: 5 mg via INTRAVENOUS
  Filled 2015-10-08: qty 5

## 2015-10-08 MED ORDER — NITROGLYCERIN IN D5W 200-5 MCG/ML-% IV SOLN
5.0000 ug/min | INTRAVENOUS | Status: DC
  Administered 2015-10-08: 5 ug/min via INTRAVENOUS
  Filled 2015-10-08: qty 250

## 2015-10-08 MED ORDER — ASPIRIN 81 MG PO CHEW
324.0000 mg | CHEWABLE_TABLET | Freq: Once | ORAL | Status: AC
  Administered 2015-10-08: 324 mg via ORAL
  Filled 2015-10-08: qty 4

## 2015-10-08 MED ORDER — HEPARIN BOLUS VIA INFUSION
4000.0000 [IU] | Freq: Once | INTRAVENOUS | Status: AC
  Administered 2015-10-08: 4000 [IU] via INTRAVENOUS
  Filled 2015-10-08: qty 4000

## 2015-10-08 MED ORDER — HEPARIN (PORCINE) IN NACL 100-0.45 UNIT/ML-% IJ SOLN
1650.0000 [IU]/h | INTRAMUSCULAR | Status: DC
  Administered 2015-10-08: 1150 [IU]/h via INTRAVENOUS
  Administered 2015-10-09: 1400 [IU]/h via INTRAVENOUS
  Administered 2015-10-10 (×2): 1650 [IU]/h via INTRAVENOUS
  Filled 2015-10-08 (×4): qty 250

## 2015-10-08 MED ORDER — ALBUTEROL SULFATE (2.5 MG/3ML) 0.083% IN NEBU
5.0000 mg | INHALATION_SOLUTION | Freq: Once | RESPIRATORY_TRACT | Status: AC
  Administered 2015-10-08: 5 mg via RESPIRATORY_TRACT
  Filled 2015-10-08: qty 6

## 2015-10-08 NOTE — Progress Notes (Signed)
ANTICOAGULATION CONSULT NOTE - Initial Consult  Pharmacy Consult for Heparin Indication: chest pain/ACS  No Known Allergies  Patient Measurements: Weight: 220 lb 0.3 oz (99.8 kg) Heparin Dosing Weight: 86 kg  Vital Signs: Temp: 98 F (36.7 C) (12/18 2156) Temp Source: Oral (12/18 2156) BP: 110/80 mmHg (12/18 2316) Pulse Rate: 125 (12/18 2315)  Labs:  Recent Labs  10/08/15 2211 10/08/15 2223  HGB 15.1 17.3*  HCT 46.0 51.0  PLT 320  --   CREATININE 2.22* 2.10*    Estimated Creatinine Clearance: 35.7 mL/min (by C-G formula based on Cr of 2.1).   Medical History: Past Medical History  Diagnosis Date  . Hypertension   . Neck pain   . Kidney stone   . Bipolar 1 disorder (HCC)   . Diverticulitis   . Anxiety   . Arthritis   . Sleep apnea   . Chronic headaches   . Stage III chronic kidney disease     Baseline creatinine 1.8  . Left inguinal hernia   . Atherosclerosis   . CAD (coronary artery disease)   . Hiatal hernia 05/06/2015  . Esophagitis versus Mallory-Weiss tear 05/06/2015  . Antritis (stomach) 05/06/2015    Medications:  Scheduled:  . heparin  4,000 Units Intravenous Once  . metoprolol  5 mg Intravenous Once   Infusions:  . heparin    . nitroGLYCERIN 5 mcg/min (10/08/15 2240)    Assessment:  [age] yr male with chest pain and shortness of breath  Elevated troponin and EKG changes  On no oral anticoagulation prior to admission, no h/o GI bleed per MD note  Pharmacy consulted to dose IV heparin for ACS/STEMI  Goal of Therapy:  Heparin level 0.3-0.7 units/ml Monitor platelets by anticoagulation protocol: Yes   Plan:   Obtain baseline aPTT and PT/INR  Give heparin 4000 unit IV bolus x 1 followed by infusion @ 1150 units/hr  Check heparin level 8 hr after heparin started  Follow heparin level & CBC daily  Poindexter, Joselyn GlassmanLeann Trefz, PharmD 10/08/2015,11:24 PM

## 2015-10-08 NOTE — ED Notes (Signed)
Pt reports right sided chest pain and shortness of breath. Labored breathing noted in triage. Hx of pneumonia, diabetes, hypertension. Tachy in triage 120-130s.

## 2015-10-08 NOTE — ED Notes (Signed)
Patient transported to X-ray 

## 2015-10-08 NOTE — ED Notes (Signed)
Bed: WA14 Expected date:  Expected time:  Means of arrival:  Comments: Triage 1 

## 2015-10-08 NOTE — ED Provider Notes (Signed)
CSN: [id number]     Arrival date & time 10/08/15  2149 History   First MD Initiated Contact with Patient 10/08/15 2204     Chief Complaint  Patient presents with  . Shortness of Breath  . Chest Pain     (Consider location/radiation/quality/duration/timing/severity/associated sxs/prior Treatment) Patient is a 69 y.o. male presenting with shortness of breath and chest pain. The history is provided by the patient.  Shortness of Breath Severity:  Severe Onset quality:  Gradual Duration:  4 days Timing:  Constant Progression:  Worsening Chronicity:  New Context comment:  Worse at night and unable to lie down.  Relieved by:  Rest and sitting up Worsened by:  Activity (lying down) Ineffective treatments: ASA. Associated symptoms: chest pain   Associated symptoms: no abdominal pain, no cough, no fever, no sputum production, no vomiting and no wheezing   Associated symptoms comment:  No lower ext swelling Chest pain:    Quality:  Aching and pressure (located in the right chest and radiating to the right neck and between the shoulder blades)   Severity:  Moderate   Onset quality:  Gradual   Duration: pt is vague about onset.  states it has been going on for awhile and has been treating with asa.   Timing:  Intermittent   Progression:  Worsening   Chronicity:  New Risk factors: no prolonged immobilization, no recent surgery and no tobacco use   Risk factors comment:  Hx of CAD but no stents Chest Pain Associated symptoms: shortness of breath   Associated symptoms: no abdominal pain, no cough, no fever and not vomiting     Past Medical History  Diagnosis Date  . Hypertension   . Neck pain   . Kidney stone   . Bipolar 1 disorder (HCC)   . Diverticulitis   . Anxiety   . Arthritis   . Sleep apnea   . Chronic headaches   . Stage III chronic kidney disease     Baseline creatinine 1.8  . Left inguinal hernia   . Atherosclerosis   . CAD (coronary artery disease)   . Hiatal  hernia 05/06/2015  . Esophagitis versus Mallory-Weiss tear 05/06/2015  . Antritis (stomach) 05/06/2015   Past Surgical History  Procedure Laterality Date  . Appendectomy      69 years old  . Partial colectomy    . Spine surgery  2013  . Anterior cervical decomp/discectomy fusion N/A 12/04/2012    Procedure: ANTERIOR CERVICAL DECOMPRESSION/DISCECTOMY FUSION 2 LEVELS;  Surgeon: Temple Pacini, MD;  Location: [facility];  Service: Neurosurgery;  Laterality: N/A;  Cervical five-six, six-seven anterior cervical decompression/discectomy fusion with allograft and plating  . Esophagogastroduodenoscopy N/A 05/06/2015    Procedure: ESOPHAGOGASTRODUODENOSCOPY (EGD);  Surgeon: Vida Rigger, MD;  Location: [facility];  Service: Endoscopy;  Laterality: N/A;   Family History  Problem Relation Age of Onset  . Anesthesia problems Neg Hx   . Heart attack Father   . Liver cancer Paternal Grandfather     unsure of other cancers   . Diabetes Sister    Social History  Substance Use Topics  . Smoking status: Former Smoker -- 1.50 packs/day for 20 years    Quit date: 10/22/1983  . Smokeless tobacco: Never Used  . Alcohol Use: No    Review of Systems  Constitutional: Negative for fever.  Respiratory: Positive for shortness of breath. Negative for cough, sputum production and wheezing.   Cardiovascular: Positive for chest pain.  Gastrointestinal: Negative for  vomiting and abdominal pain.  All other systems reviewed and are negative.     Allergies  Review of patient's allergies indicates no known allergies.  Home Medications   Prior to Admission medications   Medication Sig Start Date End Date Taking? Authorizing Provider  Aspirin-Salicylamide-Caffeine (BC HEADACHE POWDER PO) Take 1 packet by mouth every 4 (four) hours as needed (for pain).   Yes Historical Provider, MD  metoprolol succinate (TOPROL-XL) 50 MG 24 hr tablet TAKE 1 TABLET BY MOUTH EVERY DAY **NEEDS OV** 07/13/13  Yes Chelle Jeffery, PA-C   lansoprazole (PREVACID) 15 MG capsule Take 1 capsule (15 mg total) by mouth daily at 12 noon. Patient not taking: Reported on 10/08/2015 05/06/15   Christina P Rama, MD   BP 132/100 mmHg  Pulse 123  Temp(Src) 98 F (36.7 C) (Oral)  Resp 18  SpO2 96% Physical Exam  Constitutional: He is oriented to person, place, and time. He appears well-developed and well-nourished. No distress.  HENT:  Head: Normocephalic and atraumatic.  Mouth/Throat: Oropharynx is clear and moist.  Eyes: Conjunctivae and EOM are normal. Pupils are equal, round, and reactive to light.  Neck: Normal range of motion. Neck supple.  Cardiovascular: Regular rhythm and intact distal pulses.  Frequent extrasystoles are present. Tachycardia present.   No murmur heard. Pulmonary/Chest: Effort normal. Tachypnea noted. No respiratory distress. He has decreased breath sounds. He has no wheezes. He has rales.  Abdominal: Soft. He exhibits no distension. There is no tenderness. There is no rebound and no guarding.  Musculoskeletal: Normal range of motion. He exhibits no edema or tenderness.  Neurological: He is alert and oriented to person, place, and time.  Skin: Skin is warm and dry. No rash noted. No erythema. There is pallor.  Psychiatric: He has a normal mood and affect. His behavior is normal.  Nursing note and vitals reviewed.   ED Course  Procedures (including critical care time) Labs Review Labs Reviewed  BASIC METABOLIC PANEL - Abnormal; Notable for the following:    CO2 21 (*)    Glucose, Bld 135 (*)    BUN 45 (*)    Creatinine, Ser 2.22 (*)    GFR calc non Af Amer 28 (*)    GFR calc Af Amer 33 (*)    All other components within normal limits  CBC - Abnormal; Notable for the following:    WBC 12.3 (*)    RDW 17.7 (*)    All other components within normal limits  I-STAT TROPOININ, ED - Abnormal; Notable for the following:    Troponin i, poc 5.16 (*)    All other components within normal limits  I-STAT  CHEM 8, ED - Abnormal; Notable for the following:    BUN 44 (*)    Creatinine, Ser 2.10 (*)    Glucose, Bld 130 (*)    Hemoglobin 17.3 (*)    All other components within normal limits  BRAIN NATRIURETIC PEPTIDE  D-DIMER, QUANTITATIVE (NOT AT [facility])    Imaging Review Dg Chest 2 View  10/08/2015  CLINICAL DATA:  Shortness of breath and chest pain EXAM: CHEST  2 VIEW COMPARISON:  08/30/2015 FINDINGS: Diffuse interstitial coarsening with fairly symmetric perihilar airspace opacity. There are Kerley lines best visualized in the lateral projection, where small pleural effusions are also seen bilaterally. Normal heart size and stable aortic and hilar contours. IMPRESSION: Pulmonary edema and small pleural effusions. Electronically Signed   By: Marnee Spring M.D.   On: 10/08/2015 22:56  I have personally reviewed and evaluated these images and lab results as part of my medical decision-making.   EKG Interpretation   Date/Time:  Sunday October 08 2015 22:30:24 EST Ventricular Rate:  117 PR Interval:  93 QRS Duration: 109 QT Interval:  327 QTC Calculation: 456 R Axis:   49 Text Interpretation:  Sinus tachycardia Multiple premature complexes, vent  & supraven Probable left atrial enlargement Borderline low voltage,  extremity leads new Borderline ST depression, diffuse leads Baseline  wander in lead(s) III aVF Confirmed by Anitra LauthPLUNKETT  MD, Alphonzo LemmingsWHITNEY ([id number]) on  10/08/2015 10:39:28 PM      MDM   Final diagnoses:  Acute congestive heart failure, unspecified congestive heart failure type (HCC)  ACS (acute coronary syndrome) [facility](HCC)    Patient is a 69 year old male with a history of hypertension, chronic kidney disease, coronary artery disease who presents today with intermittent shortness of breath over the last 4-5 days which is worsening today as well as right-sided chest pain radiating to the neck and between the shoulder blades. The pain has been intermittently he states for some time. He  denies any cough, fever, URI symptoms. He has no abdominal pain or vomiting. No prior heart history per pt but hx of CAD on med hx.  patient denies any prior stenting.  Patient states he's been medicating the chest pain with aspirin which is only slightly helpful.   No recent unilateral leg pain or swelling. He denies any recent medication changes.   On exam patient has decreased breath sounds with mild rales but no lower extremity edema. He denies any chronic lung disease.   Concern for cardiac cause of patient's symptoms, CHF. Lower suspicion for pneumonia, pneumothorax, dissection. Low suspicion the patient's symptoms are abdominal.   EKG with new ST depression in the inferior and anterior leads.  Patient given 325 of aspirin as well as started on nitroglycerin drip. CBC, CMP, troponin, BNP, chest x-ray pending.   10:50 PM Patient has an elevated troponin today of 5.16.  Patient's chronic kidney disease is at baseline at 2.1. Hemoglobin is 15.  Chest x-ray with signs of fluid overload. We'll discuss with cardiology.  Pt has also not had his lopressor and due to tachycardia will give dose of IV lopressor and also started on heparin.  No prior hx of GI bleed.  CRITICAL CARE Performed by: Gwyneth SproutPLUNKETT,Lorelle Macaluso Total critical care time: 30 minutes Critical care time was exclusive of separately billable procedures and treating other patients. Critical care was necessary to treat or prevent imminent or life-threatening deterioration. Critical care was time spent personally by me on the following activities: development of treatment plan with patient and/or surrogate as well as nursing, discussions with consultants, evaluation of patient's response to treatment, examination of patient, obtaining history from patient or surrogate, ordering and performing treatments and interventions, ordering and review of laboratory studies, ordering and review of radiographic studies, pulse oximetry and re-evaluation of  patient's condition.   Gwyneth SproutWhitney Cambell Rickenbach, MD 10/08/15 [phone]

## 2015-10-09 ENCOUNTER — Inpatient Hospital Stay (HOSPITAL_COMMUNITY): Payer: PPO

## 2015-10-09 DIAGNOSIS — I509 Heart failure, unspecified: Secondary | ICD-10-CM

## 2015-10-09 DIAGNOSIS — I2510 Atherosclerotic heart disease of native coronary artery without angina pectoris: Secondary | ICD-10-CM

## 2015-10-09 DIAGNOSIS — I5031 Acute diastolic (congestive) heart failure: Secondary | ICD-10-CM

## 2015-10-09 DIAGNOSIS — I214 Non-ST elevation (NSTEMI) myocardial infarction: Secondary | ICD-10-CM

## 2015-10-09 DIAGNOSIS — I248 Other forms of acute ischemic heart disease: Secondary | ICD-10-CM

## 2015-10-09 LAB — COMPREHENSIVE METABOLIC PANEL
ALT: 48 U/L (ref 17–63)
ANION GAP: 15 (ref 5–15)
AST: 41 U/L (ref 15–41)
Albumin: 3.6 g/dL (ref 3.5–5.0)
Alkaline Phosphatase: 123 U/L (ref 38–126)
BUN: 38 mg/dL — AB (ref 6–20)
CHLORIDE: 102 mmol/L (ref 101–111)
CO2: 26 mmol/L (ref 22–32)
CREATININE: 2.5 mg/dL — AB (ref 0.61–1.24)
Calcium: 10 mg/dL (ref 8.9–10.3)
GFR, EST AFRICAN AMERICAN: 28 mL/min — AB (ref >60)
GFR, EST NON AFRICAN AMERICAN: 24 mL/min — AB (ref >60)
Glucose, Bld: 111 mg/dL — ABNORMAL HIGH (ref 65–99)
POTASSIUM: 4.4 mmol/L (ref 3.5–5.1)
SODIUM: 143 mmol/L (ref 135–145)
TOTAL PROTEIN: 6.6 g/dL (ref 6.5–8.1)
Total Bilirubin: 1 mg/dL (ref 0.3–1.2)

## 2015-10-09 LAB — CBC
HCT: 48.1 % (ref 39.0–52.0)
HEMOGLOBIN: 15.9 g/dL (ref 13.0–17.0)
MCH: 28.7 pg (ref 26.0–34.0)
MCHC: 33.1 g/dL (ref 30.0–36.0)
MCV: 86.8 fL (ref 78.0–100.0)
Platelets: 296 10*3/uL (ref 150–400)
RBC: 5.54 MIL/uL (ref 4.22–5.81)
RDW: 17.8 % — ABNORMAL HIGH (ref 11.5–15.5)
WBC: 12 10*3/uL — ABNORMAL HIGH (ref 4.0–10.5)

## 2015-10-09 LAB — HEPARIN LEVEL (UNFRACTIONATED)
Heparin Unfractionated: <0.1 IU/mL — ABNORMAL LOW (ref 0.30–0.70)
Heparin Unfractionated: 0.19 IU/mL — ABNORMAL LOW (ref 0.30–0.70)

## 2015-10-09 LAB — TROPONIN I
TROPONIN I: 8.56 ng/mL — AB (ref <0.031)
Troponin I: 7.29 ng/mL (ref <0.031)
Troponin I: 7.01 ng/mL (ref <0.031)

## 2015-10-09 LAB — TSH: TSH: 4.015 u[IU]/mL (ref 0.350–4.500)

## 2015-10-09 MED ORDER — DILTIAZEM LOAD VIA INFUSION
10.0000 mg | Freq: Once | INTRAVENOUS | Status: AC
  Administered 2015-10-09: 10 mg via INTRAVENOUS
  Filled 2015-10-09: qty 10

## 2015-10-09 MED ORDER — METOPROLOL SUCCINATE ER 100 MG PO TB24
100.0000 mg | ORAL_TABLET | Freq: Every day | ORAL | Status: DC
  Administered 2015-10-09 – 2015-10-12 (×4): 100 mg via ORAL
  Filled 2015-10-09 (×4): qty 1

## 2015-10-09 MED ORDER — SODIUM CHLORIDE 0.9 % IJ SOLN
3.0000 mL | INTRAMUSCULAR | Status: DC | PRN
  Administered 2015-10-09: 3 mL via INTRAVENOUS
  Filled 2015-10-09: qty 3

## 2015-10-09 MED ORDER — PANTOPRAZOLE SODIUM 40 MG PO TBEC
40.0000 mg | DELAYED_RELEASE_TABLET | Freq: Every day | ORAL | Status: DC
Start: 2015-10-09 — End: 2015-10-13
  Administered 2015-10-09 – 2015-10-12 (×4): 40 mg via ORAL
  Filled 2015-10-09 (×4): qty 1

## 2015-10-09 MED ORDER — FUROSEMIDE 10 MG/ML IJ SOLN
80.0000 mg | Freq: Two times a day (BID) | INTRAMUSCULAR | Status: DC
  Administered 2015-10-09 (×2): 80 mg via INTRAVENOUS
  Filled 2015-10-09 (×2): qty 8

## 2015-10-09 MED ORDER — ACETAMINOPHEN 325 MG PO TABS
650.0000 mg | ORAL_TABLET | ORAL | Status: DC | PRN
  Administered 2015-10-09 – 2015-10-11 (×4): 650 mg via ORAL
  Filled 2015-10-09 (×5): qty 2

## 2015-10-09 MED ORDER — TRAMADOL HCL 50 MG PO TABS
50.0000 mg | ORAL_TABLET | Freq: Four times a day (QID) | ORAL | Status: DC | PRN
  Administered 2015-10-09 – 2015-10-12 (×6): 50 mg via ORAL
  Filled 2015-10-09 (×6): qty 1

## 2015-10-09 MED ORDER — DEXTROSE 5 % IV SOLN
INTRAVENOUS | Status: AC
  Filled 2015-10-09: qty 100

## 2015-10-09 MED ORDER — ASPIRIN EC 81 MG PO TBEC
81.0000 mg | DELAYED_RELEASE_TABLET | Freq: Every day | ORAL | Status: DC
  Administered 2015-10-09 – 2015-10-12 (×4): 81 mg via ORAL
  Filled 2015-10-09 (×4): qty 1

## 2015-10-09 MED ORDER — SIMVASTATIN 20 MG PO TABS
20.0000 mg | ORAL_TABLET | Freq: Every day | ORAL | Status: DC
  Administered 2015-10-09 – 2015-10-25 (×16): 20 mg via ORAL
  Filled 2015-10-09 (×18): qty 1

## 2015-10-09 MED ORDER — SODIUM CHLORIDE 0.9 % IV SOLN: 250.0000 mL | INTRAVENOUS | Status: DC | PRN

## 2015-10-09 MED ORDER — METOPROLOL SUCCINATE ER 50 MG PO TB24
50.0000 mg | ORAL_TABLET | Freq: Every day | ORAL | Status: DC
  Filled 2015-10-09: qty 1

## 2015-10-09 MED ORDER — ONDANSETRON HCL 4 MG/2ML IJ SOLN: 4.0000 mg | Freq: Four times a day (QID) | INTRAMUSCULAR | Status: DC | PRN

## 2015-10-09 MED ORDER — SODIUM CHLORIDE 0.9 % IJ SOLN
3.0000 mL | Freq: Two times a day (BID) | INTRAMUSCULAR | Status: DC
  Administered 2015-10-10 (×2): 3 mL via INTRAVENOUS

## 2015-10-09 MED ORDER — ALBUTEROL SULFATE (2.5 MG/3ML) 0.083% IN NEBU
5.0000 mg | INHALATION_SOLUTION | Freq: Once | RESPIRATORY_TRACT | Status: AC
  Administered 2015-10-09: 5 mg via RESPIRATORY_TRACT

## 2015-10-09 MED ORDER — DILTIAZEM HCL 100 MG IV SOLR
5.0000 mg/h | INTRAVENOUS | Status: DC
  Administered 2015-10-09: 5 mg/h via INTRAVENOUS
  Administered 2015-10-10: 10 mg/h via INTRAVENOUS
  Filled 2015-10-09: qty 100

## 2015-10-09 MED ORDER — LAMOTRIGINE 200 MG PO TABS
200.0000 mg | ORAL_TABLET | Freq: Every day | ORAL | Status: DC
  Administered 2015-10-09 – 2015-10-12 (×4): 200 mg via ORAL
  Filled 2015-10-09 (×6): qty 1

## 2015-10-09 MED ORDER — HEPARIN BOLUS VIA INFUSION
1000.0000 [IU] | Freq: Once | INTRAVENOUS | Status: AC
  Administered 2015-10-09: 1000 [IU] via INTRAVENOUS
  Filled 2015-10-09: qty 1000

## 2015-10-09 MED ORDER — NITROGLYCERIN IN D5W 200-5 MCG/ML-% IV SOLN
2.0000 ug/min | INTRAVENOUS | Status: DC
  Administered 2015-10-09: 5 ug/min via INTRAVENOUS

## 2015-10-09 MED ORDER — ALBUTEROL SULFATE (2.5 MG/3ML) 0.083% IN NEBU
INHALATION_SOLUTION | RESPIRATORY_TRACT | Status: AC
  Filled 2015-10-09: qty 6

## 2015-10-09 NOTE — Progress Notes (Signed)
ANTICOAGULATION CONSULT NOTE  Pharmacy Consult for Heparin Indication: chest pain/ACS  No Known Allergies  Patient Measurements: Height: 5\' 6"  (167.6 cm) Weight: 220 lb (99.791 kg) IBW/kg (Calculated) : 63.8 Heparin Dosing Weight: 86 kg  Vital Signs: Temp: 98 F (36.7 C) (12/19 0458) Temp Source: Oral (12/19 0458) BP: 137/93 mmHg (12/19 0458) Pulse Rate: 120 (12/19 0458)  Labs:  Recent Labs  10/08/15 2211 10/08/15 2223 10/08/15 2236 10/09/15 0519  HGB 15.1 17.3*  --   --   HCT 46.0 51.0  --   --   PLT 320  --   --   --   APTT  --   --  26  --   LABPROT  --   --  14.1  --   INR  --   --  1.07  --   CREATININE 2.22* 2.10*  --  2.50*    Estimated Creatinine Clearance: 30 mL/min (by C-G formula based on Cr of 2.5).   Medical History: Past Medical History  Diagnosis Date  . Hypertension   . Kidney stone   . Bipolar 1 disorder (HCC)   . Diverticulitis   . Anxiety   . Arthritis   . Sleep apnea   . Chronic headaches   . Stage III chronic kidney disease     Baseline creatinine 1.8  . Left inguinal hernia   . Atherosclerosis   . CAD (coronary artery disease)   . Hiatal hernia   . 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 Antritis (stomach)     Medications:  Scheduled:  . albuterol      . aspirin EC  81 mg Oral Daily  . furosemide  80 mg Intravenous Q12H  . lamoTRIgine  200 mg Oral Daily  . metoprolol succinate  50 mg Oral Daily  . pantoprazole  40 mg Oral Q1200  . simvastatin  20 mg Oral Daily  . sodium chloride  3 mL Intravenous Q12H   Infusions:  . heparin 1,150 Units/hr (10/08/15 2357)  . nitroGLYCERIN 5 mcg/min (10/09/15 [id number])    Assessment: 69 yr male with chest pain and shortness of breath. Elevated troponin and EKG changes. On no oral anticoagulation prior to admission, no h/o GI bleed per MD note. Pharmacy consulted to dose IV heparin for ACS/STEMI.  HL undetectable on 1150 units/h. CBC wnl. No bleed/IV line issues per RN and gtt not off  for any period of time.  Goal of Therapy:  Heparin level 0.3-0.7 units/ml Monitor platelets by anticoagulation protocol: Yes   Plan:  Heparin @1000  unit bolus Increase heparin to 1400 units/h 8h HL, Daily HL/CBC Mon s/sx bleeding  Babs BertinHaley Shaunice Levitan, PharmD, Integris Health EdmondBCPS Clinical Pharmacist Pager [phone] 10/09/2015 9:02 AM

## 2015-10-09 NOTE — Progress Notes (Signed)
  Echocardiogram 2D Echocardiogram has been performed.  Jordan SavoyCasey N Isamar [name] 10/09/2015, 12:10 PM

## 2015-10-09 NOTE — Progress Notes (Addendum)
Peter SwazilandJordan MD notified of critical lab value for troponin.8.56 . Pt asymptomatic. No new orders received. Will continue to monitor Tarri GlennFolashade Alabi, RN

## 2015-10-09 NOTE — Progress Notes (Addendum)
ON CALL CARDIOLOGY NOTE  Subjective: I was alerted by the patient's nurse that Mr. Jordan [name] had developed a narrow complex tachycardia with associated back pain.  The patient was found to have a heart rate in the 170s to 180s.  He is complaining about vague chest pain as well as pain between his shoulder blades that awoke him from sleep this evening.  The timing the pain corresponds to development of the narrow complex tachycardia.  He otherwise feels very well.  He notes that the pain that he is currently feeling is similar to what had initially brought him to the hospital.  Objective: Temp:  [97.4 F (36.3 C)-98 F (36.7 C)] 97.4 F (36.3 C) (12/19 2024) Pulse Rate:  [80-120] 107 (12/19 2024) Resp:  [22-35] 22 (12/19 2024) BP: (93-140)/(62-97) 127/97 mmHg (12/19 2250) SpO2:  [95 %-100 %] 95 % (12/19 2250) Weight:  [99.791 kg (220 lb)] 99.791 kg (220 lb) (12/19 0416)  General: Overweight man lying comfortably in bed. Neck: No JVD or HJR. Respiratory: Clear to auscultation bilaterally without crackles or wheezes. Cardiovascular: Tachycardic and irregularly irregular.  No murmurs. Abdomen: Bowel sounds present.  Soft, nontender, nondistended. Extremities: No lower extremity edema.  1+ pedal pulses bilaterally.  EKG: Atrial fibrillation with rapid ventricular response (heart rate 172 bpm).  Anteroseptal ST segment depression present.  Atrial fibrillation is new compared to the prior tracing from earlier the same day.  ST segment depressions in the anteroseptal leads has also worsened.  Transthoracic echocardiogram: Normal LV size and function (EF 60-65%).  Suboptimally imaged mitral valve with likely mild to moderate MR.  Mildly dilated left atrium.  Normal RV size and function.  Assessment/plan: 69 year old man with history of hypertension, chronic kidney disease, and bipolar disorder, admitted for NSTEMI and diastolic heart failure exacerbation, who developed acute onset of chest pain and  back pain in the setting of atrial fibrillation with rapid ventricular response.  It is possible that his recent symptoms leading up to this hospitalization and NSTEMI are related to paroxysmal atrial fibrillation and fixed coronary artery disease resulting in supply-demand mismatch.  His EKG at this time is consistent with atrial fibrillation with rapid ventricular response and ischemic changes in the anteroseptal leads.  Given his preserved ejection fraction by echocardiogram and normal blood pressure, I will initiate a diltiazem infusion with a 10 mg IV bolus.  If we are unsuccessful in controlling his heart rate and chest pain, we would need to consider emergent cardioversion, though given the potential for multiple episodes of atrial fibrillation over the preceding weeks, I would rather not do this until intracardiac thrombus has been cleared by TEE.  I will make patient nothing by mouth after midnight for possible TEE/cardioversion.  I will also have pharmacy redosed his heparin infusion in light of his atrial fibrillation.  Addendum (10/10/2015 at 1:08 AM): Patient continued to be in atrial fibrillation with RVR following diltiazem bolus and initiation of infusion at 5 mg/hr.  he was given nitroglycerin 0.4 mg sublingual 1 without significant improvement of chest pain but drop in blood pressure.  As he is more than 2 L negative over the last 24 hours, he was given a 250 mL bolus of normal saline.  Heparin infusion was titrated up to 10 mg/hr with improved heart rate control and ultimate conversion to sinus rhythm.  Patient reported resolution of pain to his nurse with heart rate control and is currently sleeping comfortably.  We will continue diltiazem and heparin infusions at this time  pending conversion to oral regimen in the coming days.  I will also discontinue standing furosemide so that his volume status can be reassessed in the morning, as the patient currently appears euvolemic.  Yvonne Kendallhristopher  Gwynneth Fabio, MD Cardiology Fellow

## 2015-10-09 NOTE — Progress Notes (Signed)
ANTICOAGULATION CONSULT NOTE - Follow Up Consult  Pharmacy Consult for Heparin Indication: chest pain/ACS  No Known Allergies  Patient Measurements: Height: 5\' 6"  (167.6 cm) Weight: 220 lb (99.791 kg) IBW/kg (Calculated) : 63.8 Heparin Dosing Weight: 86 kg  Vital Signs: Temp: 97.7 F (36.5 C) (12/19 1316) Temp Source: Oral (12/19 1316) BP: 93/62 mmHg (12/19 1316) Pulse Rate: 84 (12/19 1316)  Labs:  Recent Labs  10/08/15 2211 10/08/15 2223 10/08/15 2236 10/09/15 0519 10/09/15 0850 10/09/15 1032 10/09/15 1433 10/09/15 1816  HGB 15.1 17.3*  --   --  15.9  --   --   --   HCT 46.0 51.0  --   --  48.1  --   --   --   PLT 320  --   --   --  296  --   --   --   APTT  --   --  26  --   --   --   --   --   LABPROT  --   --  14.1  --   --   --   --   --   INR  --   --  1.07  --   --   --   --   --   HEPARINUNFRC  --   --   --   --  <0.10*  --   --  0.19*  CREATININE 2.22* 2.10*  --  2.50*  --   --   --   --   TROPONINI  --   --   --   --   --  8.56* 7.29*  --     Estimated Creatinine Clearance: 30 mL/min (by C-G formula based on Cr of 2.5).   Medications:  Heparin @ 1400 units/hr  Assessment: 69 YOM with chest pain and shortness of breath. Elevated troponin and EKG changes. On no oral anticoagulation prior to admission, no h/o GI bleed per MD note. Pharmacy consulted to dose IV heparin for ACS/STEMI.  Heparin level this evening is SUBtherapeutic but trending up after a rate adjustment this morning (HL 0.19 up from <0.1, goal of 0.3-0.7). CBC stable - no overt s/sx of bleeding noted.   Goal of Therapy:  Heparin level 0.3-0.7 units/ml Monitor platelets by anticoagulation protocol: Yes   Plan:  1. Increase heparin to 1650 units/hr (16.5 ml/hr) 2. Will continue to monitor for any signs/symptoms of bleeding and will follow up with heparin level in 8 hours   Georgina PillionElizabeth Ahkeem Goede, PharmD, BCPS Clinical Pharmacist Pager: [phone] 10/09/2015 7:10 PM

## 2015-10-09 NOTE — Progress Notes (Signed)
UR Completed. Cyndra Feinberg, RN, BSN.  [phone] 

## 2015-10-09 NOTE — H&P (Addendum)
History and Physical   Admit date: 10/08/2015 Name:  Jordan [name] [facility] Medical record number: [id number] DOB/Age:  04/11/1946  69 y.o. male  Referring Physician:  [facility]  Primary Physician:   Dr. Mila Palmer  Chief complaint/reason for admission: Shortness of breath chest pain hurts all over   HPI:  This 69 year old male is seen at the request of the [facility] long emergency room physician.  The patient presented to the emergency room this evening primarily at the request of his ex-wife because of worsening weakness, shortness of breath and pain.  He is weak and states that he has been sick for over a week.  He states that he was treated for a cold by his primary physician and also has been having pain all over.  He predominantly complains of pain involving his shoulders and between his mid back but also complains of pain in his anterior chest under his armpits and up in his neck area.  He has had worsening shortness of breath.  He is vague in his description of the pain and has been taking a lot of Goody powders and aspirin.  He notes that he will have symptoms with exertion and complains of severe shortness of breath.  He has not slept for 4 days.  He evidently saw his primary physician on Friday for weakness but we cannot obtain any records from that.  When seen in the emergency room he was found to be in pulmonary edema and also had a troponin of 5.  He previously had a workup for syncope in October when he had syncope after returning from a cruise and evidently had a negative ventilation perfusion lung scan a negative Holter and an echocardiogram that showed mild LVH but normal systolic function.  He has evidently had a neurologic workup also.  He lives alone and could not tell me the name of his medication.  He has had mild edema.  He had admission for GI bleeding earlier this year.   Past Medical History  Diagnosis Date  . Hypertension   . Kidney stone   . Bipolar 1 disorder  (HCC)   . Diverticulitis   . Anxiety   . Arthritis   . Sleep apnea   . Chronic headaches   . Stage III chronic kidney disease     Baseline creatinine 1.8  . Left inguinal hernia   . Atherosclerosis   . CAD (coronary artery disease)   . Hiatal hernia   . 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 Antritis (stomach)      Past Surgical History  Procedure Laterality Date  . Appendectomy      69 years old  . Partial colectomy    . Spine surgery  2013  . Anterior cervical decomp/discectomy fusion N/A 12/04/2012    Procedure: ANTERIOR CERVICAL DECOMPRESSION/DISCECTOMY FUSION 2 LEVELS;  Surgeon: Temple Pacini, MD;  Location: [facility];  Service: Neurosurgery;  Laterality: N/A;  Cervical five-six, six-seven anterior cervical decompression/discectomy fusion with allograft and plating  . Esophagogastroduodenoscopy N/A 05/06/2015    Procedure: ESOPHAGOGASTRODUODENOSCOPY (EGD);  Surgeon: Vida Rigger, MD;  Location: [facility];  Service: Endoscopy;  Laterality: N/A;   Allergies: has No Known Allergies.   Medications: Prior to Admission medications   Medication Sig Start Date End Date Taking? Authorizing Provider  Aspirin-Salicylamide-Caffeine (BC HEADACHE POWDER PO) Take 1 packet by mouth every 4 (four) hours as needed (for pain).   Yes Historical Provider, MD  bisoprolol (ZEBETA) 5 MG  tablet Take 5 mg by mouth daily.   Yes Historical Provider, MD  metoprolol succinate (TOPROL-XL) 50 MG 24 hr tablet TAKE 1 TABLET BY MOUTH EVERY DAY **NEEDS OV** 07/13/13  Yes Chelle Jeffery, PA-C  simvastatin (ZOCOR) 20 MG tablet Take 20 mg by mouth daily.   Yes Historical Provider, MD  lansoprazole (PREVACID) 15 MG capsule Take 1 capsule (15 mg total) by mouth daily at 12 noon. Patient not taking: Reported on 10/08/2015 05/06/15   Maryruth Bunhristina P Rama, MD   Family History:  Family Status  Relation Status Death Age  . Father Deceased   . Mother Alive   . Sister Alive   . Sister Alive   . Sister Alive   .  Brother Deceased     Social History:   reports that he quit smoking about 31 years ago. He has never used smokeless tobacco. He reports that he does not drink alcohol or use illicit drugs.   Social History   Social History Narrative   Married.  Independent of ADLs.  Ambulates independently.   retired Advertising account plannerroofing contractor.  He is currently separated from his wife and currently lives alone in [place].  He has 2 children male and male.   Review of Systems: He has no skin problems.  He has been weak and is not feeling well.  States that he has gained some weight.  Complains of mild edema.  Has history of kidney stones and also has nocturia as well as urgency.  He has a prior history of GI bleeding and has had diverticulosis in the past.  He has required blood transfusions in the past.  He has occasional headaches as a history of sleep apnea but states that he cannot wear C Pap device.  He has significant insomnia as well as anxiety. Other than as noted above, the remainder of the review of systems is normal  Physical Exam: BP 117/87 mmHg  Pulse 104  Temp(Src) 98 F (36.7 C) (Oral)  Resp 32  Wt 99.8 kg (220 lb 0.3 oz)  SpO2 96% General appearance: He has a elderly male appearing older than stated age and mildly obese.  He is a very poor historian Head: Normocephalic, without obvious abnormality, atraumatic, Balding male hair pattern Eyes: conjunctivae/corneas clear. PERRL, EOM's intact. Fundi not examined  Neck: no adenopathy, no carotid bruit, no JVD and supple, symmetrical, trachea midline Lungs: Crackles noted at the bases with decreased breath sounds Heart: Rapid rhythm with occasional premature beats, normal S1 and S2, no S3, possible S4 noted, no definite murmur heard somewhat distant heart sounds  Abdomen: Distended soft and nontender no mass or organomegaly Rectal: deferred Extremities: Atraumatic normal range of motion, 1+ peripheral edema Pulses: 2+ and symmetric Skin: Skin  color, texture, turgor normal. No rashes or lesions Neurologic: Grossly normal  Labs: CBC  Recent Labs  10/08/15 2211 10/08/15 2223  WBC 12.3*  --   RBC 5.31  --   HGB 15.1 17.3*  HCT 46.0 51.0  PLT 320  --   MCV 86.6  --   MCH 28.4  --   MCHC 32.8  --   RDW 17.7*  --    CMP   Recent Labs  10/08/15 2211 10/08/15 2223  NA 141 141  K 3.8 3.7  CL 108 107  CO2 21*  --   GLUCOSE 135* 130*  BUN 45* 44*  CREATININE 2.22* 2.10*  CALCIUM 9.9  --   GFRNONAA 28*  --   GFRAA 33*  --  BNP (last 3 results) BNP    Component Value Date/Time   BNP 1513.6* 10/08/2015 2218    Cardiac Panel (last 3 results) Troponin (Point of Care Test)  Recent Labs  10/08/15 2218  TROPIPOC 5.16*   Thyroid  Lab Results  Component Value Date   TSH 1.702 07/28/2015    EKG: Sinus with nonspecific ST and T-wave changes, left atrial enlargement  Radiology: Pulmonary edema and small effusions   IMPRESSIONS: 1.  Non-STEMI 2.  Congestive heart failure probable diastolic but unclear as to etiology but with acute pulmonary edema 3.  Poor historian 4.  Stage 3-4 chronic kidney disease -  Worse today  Normally sees Dr. Eliott Nine 5.  History of GI bleeding 6.  Neck pain of uncertain etiology 7.  History of sleep apnea  PLAN: Patient is ill and has evidence of heart failure and elevated troponins.  He has significant stage 3-4 chronic kidney disease.  He will be heparinized but will need to be careful because of the history of GI bleeding.  Follow hemoglobin closely.  We'll try to diurese and follow oxygen saturations.  He had a normal echo in October but will need to repeat echocardiogram at this time.  Need to obtain records from primary physician is patient is a poor historian unclear of what medications he is currently taking.  Check serial cardiac enzymes.  Keep nothing by mouth for further testing in the morning.  Needs renal consult in am.    Signed: W. Ashley Royalty MD  [facility]  10/09/2015, 12:30 AM

## 2015-10-09 NOTE — Progress Notes (Signed)
TELEMETRY: Reviewed telemetry pt in Sinus tachy with frequent PACs, PVCs and triplets. : Filed Vitals:   10/09/15 0045 10/09/15 0239 10/09/15 0416 10/09/15 0458  BP: 111/80 124/86  137/93  Pulse: 103 80  120  Temp:  98 F (36.7 C)  98 F (36.7 C)  TempSrc:  Oral  Oral  Resp: 31 28  30   Height:   5\' 6"  (1.676 m)   Weight:   99.791 kg (220 lb)   SpO2: 97% 97%  97%    Intake/Output Summary (Last 24 hours) at 10/09/15 0938 Last data filed at 10/09/15 [id number]  Gross per 24 hour  Intake      0 ml  Output   1520 ml  Net  -1520 ml   Filed Weights   10/08/15 2319 10/09/15 0416  Weight: 99.8 kg (220 lb 0.3 oz) 99.791 kg (220 lb)    Subjective Patient is a difficult historian. Complains of back and neck pain. Takes predominantly ASA for this. Recent increased dyspnea- feels like lung is collapsed and can't take a deep breath. Did have some chest and back pain yesterday and came to ED.   Marland Kitchen. albuterol      . aspirin EC  81 mg Oral Daily  . furosemide  80 mg Intravenous Q12H  . lamoTRIgine  200 mg Oral Daily  . metoprolol succinate  50 mg Oral Daily  . pantoprazole  40 mg Oral Q1200  . simvastatin  20 mg Oral Daily  . sodium chloride  3 mL Intravenous Q12H   . heparin 1,150 Units/hr (10/08/15 2357)  . nitroGLYCERIN 5 mcg/min (10/09/15 0333)    LABS: Basic Metabolic Panel:  Recent Labs  11/91/4712/18/16 2211 10/08/15 2223 10/09/15 0519  NA 141 141 143  K 3.8 3.7 4.4  CL 108 107 102  CO2 21*  --  26  GLUCOSE 135* 130* 111*  BUN 45* 44* 38*  CREATININE 2.22* 2.10* 2.50*  CALCIUM 9.9  --  10.0   Liver Function Tests:  Recent Labs  10/09/15 0519  AST 41  ALT 48  ALKPHOS 123  BILITOT 1.0  PROT 6.6  ALBUMIN 3.6   No results for input(s): LIPASE, AMYLASE in the last 72 hours. CBC:  Recent Labs  10/08/15 2211 10/08/15 2223 10/09/15 0850  WBC 12.3*  --  12.0*  HGB 15.1 17.3* 15.9  HCT 46.0 51.0 48.1  MCV 86.6  --  86.8  PLT 320  --  296   Cardiac Enzymes: No  results for input(s): CKTOTAL, CKMB, CKMBINDEX, TROPONINI in the last 72 hours. BNP: No results for input(s): PROBNP in the last 72 hours. D-Dimer:  Recent Labs  10/08/15 2236  DDIMER 1.75*   Hemoglobin A1C: No results for input(s): HGBA1C in the last 72 hours. Fasting Lipid Panel: No results for input(s): CHOL, HDL, LDLCALC, TRIG, CHOLHDL, LDLDIRECT in the last 72 hours. Thyroid Function Tests:  Recent Labs  10/09/15 0519  TSH 4.015     Radiology/Studies:  Dg Chest 2 View  10/08/2015  CLINICAL DATA:  Shortness of breath and chest pain EXAM: CHEST  2 VIEW COMPARISON:  08/30/2015 FINDINGS: Diffuse interstitial coarsening with fairly symmetric perihilar airspace opacity. There are Kerley lines best visualized in the lateral projection, where small pleural effusions are also seen bilaterally. Normal heart size and stable aortic and hilar contours. IMPRESSION: Pulmonary edema and small pleural effusions. Electronically Signed   By: Marnee SpringJonathon  Watts M.D.   On: 10/08/2015 22:56   Ecg shows sinus  tach with PVCs. Minor nonspecific ST abnormality.   PHYSICAL EXAM General: Well developed, obese, in no acute distress. Head: Normocephalic, atraumatic, sclera non-icteric, oropharynx is clear Neck: Negative for carotid bruits. JVD not elevated. No adenopathy Lungs: Bibasilar crackles.  Heart: RRR S1 S2 without murmurs, rubs, or gallops.  Abdomen: Soft, non-tender, non-distended with normoactive bowel sounds. No hepatomegaly. No rebound/guarding. No obvious abdominal masses. Msk:  Strength and tone appears normal for age. Extremities: No clubbing, cyanosis or edema.  Distal pedal pulses are 2+ and equal bilaterally. Neuro: Alert and oriented X 3. Moves all extremities spontaneously. Psych:  Responds to questions appropriately with a normal affect.   ASSESSMENT AND PLAN: 1. Acute diastolic CHF. Etiology is unclear. Normal EF by Echo in October. Denies heavy salt use. Avoids NSAIDs other  than ASA. Doubt RAS with normal Renal MRA in 2014. CHF may be related to acute ischemia versus new cardiomyopathy. Will diurese with IV lasix. Monitor I/O and daily weight. Watch renal function closely. Not a candidate for ACEi/ARB due to CKD. Will increase beta blocker. 2. Elevated troponin. I suspect this is related to demand ischemia in the setting of acute pulmonary edema and CKD. Will cycle cardiac enzymes. Continue ASA and IV heparin for now. Will need some ischemic work up once acute CHF improved. May consider nuclear stress test. Would like to avoid contrast load with cardiac cath unless really necessary.  3. HTN- controlled. 4. CKD stage 3-4. 5. History of gastritis in July 2016 secondary to ASA and Goody's powders. Counseled on use of only 81 mg ASA daily. Otherwise would recommend Tylenol for pain. If unrelieved we can try Ultram. Patient reports he was told not to take Tylenol because of his kidneys but I think he is referring to NSAIDs.  6. Back and neck pain- chronic. Etiology unclear.  7. Sleep apnea.   Present on Admission:  . Non-STEMI (non-ST elevated myocardial infarction) (HCC)  Signed, Shondrea Steinert [place], MDFACC 10/09/2015 9:38 AM

## 2015-10-09 NOTE — Progress Notes (Signed)
Paged Jordan Skains MD about pts pain.Pt reports that he does not take tylenol. Will  continue to monitor. Tarri GlennFolashade Alabi, RN

## 2015-10-10 ENCOUNTER — Inpatient Hospital Stay (HOSPITAL_COMMUNITY): Payer: PPO

## 2015-10-10 DIAGNOSIS — I4891 Unspecified atrial fibrillation: Secondary | ICD-10-CM

## 2015-10-10 DIAGNOSIS — M4722 Other spondylosis with radiculopathy, cervical region: Secondary | ICD-10-CM

## 2015-10-10 DIAGNOSIS — N184 Chronic kidney disease, stage 4 (severe): Secondary | ICD-10-CM

## 2015-10-10 DIAGNOSIS — I110 Hypertensive heart disease with heart failure: Secondary | ICD-10-CM

## 2015-10-10 LAB — URINALYSIS, ROUTINE W REFLEX MICROSCOPIC
BILIRUBIN URINE: NEGATIVE
GLUCOSE, UA: NEGATIVE mg/dL
HGB URINE DIPSTICK: NEGATIVE
KETONES UR: NEGATIVE mg/dL
Leukocytes, UA: NEGATIVE
Nitrite: NEGATIVE
PH: 5.5 (ref 5.0–8.0)
Protein, ur: NEGATIVE mg/dL
SPECIFIC GRAVITY, URINE: 1.009 (ref 1.005–1.030)

## 2015-10-10 LAB — CBC
HEMATOCRIT: 44.7 % (ref 39.0–52.0)
HEMOGLOBIN: 14.7 g/dL (ref 13.0–17.0)
MCH: 28.5 pg (ref 26.0–34.0)
MCHC: 32.9 g/dL (ref 30.0–36.0)
MCV: 86.8 fL (ref 78.0–100.0)
Platelets: 290 10*3/uL (ref 150–400)
RBC: 5.15 MIL/uL (ref 4.22–5.81)
RDW: 17.5 % — AB (ref 11.5–15.5)
WBC: 10.5 10*3/uL (ref 4.0–10.5)

## 2015-10-10 LAB — BASIC METABOLIC PANEL
Anion gap: 13 (ref 5–15)
Anion gap: 10 (ref 5–15)
BUN: 45 mg/dL — AB (ref 6–20)
BUN: 43 mg/dL — AB (ref 6–20)
CALCIUM: 8.4 mg/dL — AB (ref 8.9–10.3)
CALCIUM: 8.6 mg/dL — AB (ref 8.9–10.3)
CO2: 21 mmol/L — ABNORMAL LOW (ref 22–32)
CO2: 25 mmol/L (ref 22–32)
CREATININE: 2.53 mg/dL — AB (ref 0.61–1.24)
Chloride: 106 mmol/L (ref 101–111)
Chloride: 107 mmol/L (ref 101–111)
Creatinine, Ser: 2.5 mg/dL — ABNORMAL HIGH (ref 0.61–1.24)
GFR calc Af Amer: 28 mL/min — ABNORMAL LOW (ref >60)
GFR calc Af Amer: 28 mL/min — ABNORMAL LOW (ref >60)
GFR, EST NON AFRICAN AMERICAN: 24 mL/min — AB (ref >60)
GFR, EST NON AFRICAN AMERICAN: 24 mL/min — AB (ref >60)
GLUCOSE: 129 mg/dL — AB (ref 65–99)
GLUCOSE: 124 mg/dL — AB (ref 65–99)
POTASSIUM: 3.3 mmol/L — AB (ref 3.5–5.1)
Potassium: 3.8 mmol/L (ref 3.5–5.1)
SODIUM: 140 mmol/L (ref 135–145)
Sodium: 142 mmol/L (ref 135–145)

## 2015-10-10 LAB — HEPARIN LEVEL (UNFRACTIONATED)
HEPARIN UNFRACTIONATED: 0.4 [IU]/mL (ref 0.30–0.70)
Heparin Unfractionated: 0.41 IU/mL (ref 0.30–0.70)

## 2015-10-10 LAB — MAGNESIUM: MAGNESIUM: 2 mg/dL (ref 1.7–2.4)

## 2015-10-10 MED ORDER — AMIODARONE HCL IN DEXTROSE 360-4.14 MG/200ML-% IV SOLN
30.0000 mg/h | INTRAVENOUS | Status: DC
  Administered 2015-10-10 – 2015-10-12 (×5): 30 mg/h via INTRAVENOUS
  Filled 2015-10-10 (×5): qty 200

## 2015-10-10 MED ORDER — FUROSEMIDE 10 MG/ML IJ SOLN
80.0000 mg | Freq: Once | INTRAMUSCULAR | Status: AC
  Administered 2015-10-10: 80 mg via INTRAVENOUS
  Filled 2015-10-10: qty 8

## 2015-10-10 MED ORDER — NITROGLYCERIN 0.4 MG SL SUBL
SUBLINGUAL_TABLET | SUBLINGUAL | Status: AC
  Administered 2015-10-10: 0.4 mg
  Filled 2015-10-10: qty 1

## 2015-10-10 MED ORDER — AMIODARONE LOAD VIA INFUSION
150.0000 mg | Freq: Once | INTRAVENOUS | Status: AC
  Administered 2015-10-10: 150 mg via INTRAVENOUS
  Filled 2015-10-10: qty 83.34

## 2015-10-10 MED ORDER — SODIUM CHLORIDE 0.9 % IV BOLUS (SEPSIS)
250.0000 mL | Freq: Once | INTRAVENOUS | Status: AC
Start: 2015-10-10 — End: 2015-10-10
  Administered 2015-10-10: 250 mL via INTRAVENOUS

## 2015-10-10 MED ORDER — AMIODARONE HCL IN DEXTROSE 360-4.14 MG/200ML-% IV SOLN
60.0000 mg/h | INTRAVENOUS | Status: AC
  Administered 2015-10-10: 60 mg/h via INTRAVENOUS
  Filled 2015-10-10: qty 200

## 2015-10-10 MED ORDER — POTASSIUM CHLORIDE ER 10 MEQ PO TBCR
40.0000 meq | EXTENDED_RELEASE_TABLET | Freq: Once | ORAL | Status: AC
  Administered 2015-10-10: 40 meq via ORAL
  Filled 2015-10-10 (×2): qty 4

## 2015-10-10 NOTE — Consult Note (Signed)
Jordan [name] is an 69 y.o. male referred by Dr [place]   Chief Complaint: CKD 4 HPI: 69yo WM admitted for SOB and chest and back pain.  Followed by Dr Eliott Nine for CKD 3/4 sec to HTN.  Last seen 4/16 with Scr 2.1.  Scr has remained there and on admission Scr 2.22, yest 2.5 and today 2.5.  CXR shows some pulm edema.  Asked to see to discuss risks of heart cath.  Last night went into A fib with RVR and on further hx he may have been goin in and out of this over last couple of months.  He has been taking multiple BC powders a day for his pain.  Past Medical History  Diagnosis Date  . Hypertension   . Kidney stone   . Bipolar 1 disorder (HCC)   . Diverticulitis   . Anxiety   . Arthritis   . Sleep apnea   . Chronic headaches   . Stage III chronic kidney disease     Baseline creatinine 1.8  . Left inguinal hernia   . Atherosclerosis   . CAD (coronary artery disease)   . Hiatal hernia   . 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 Antritis (stomach)     Past Surgical History  Procedure Laterality Date  . Appendectomy      69 years old  . Partial colectomy    . Spine surgery  2013  . Anterior cervical decomp/discectomy fusion N/A 12/04/2012    Procedure: ANTERIOR CERVICAL DECOMPRESSION/DISCECTOMY FUSION 2 LEVELS;  Surgeon: Temple Pacini, MD;  Location: [facility];  Service: Neurosurgery;  Laterality: N/A;  Cervical five-six, six-seven anterior cervical decompression/discectomy fusion with allograft and plating  . Esophagogastroduodenoscopy N/A 05/06/2015    Procedure: ESOPHAGOGASTRODUODENOSCOPY (EGD);  Surgeon: Vida Rigger, MD;  Location: [facility];  Service: Endoscopy;  Laterality: N/A;    Family History  Problem Relation Age of Onset  . Anesthesia problems Neg Hx   . Heart attack Father   . Liver cancer Paternal Grandfather     unsure of other cancers   . Diabetes Sister   FH neg for renal ds  Social History:  reports that he quit smoking about 31 years ago. He has never used  smokeless tobacco. He reports that he does not drink alcohol or use illicit drugs.  Married, lives with wife  Allergies: No Known Allergies  Medications Prior to Admission  Medication Sig Dispense Refill  . Aspirin-Salicylamide-Caffeine (BC HEADACHE POWDER PO) Take 1 packet by mouth every 4 (four) hours as needed (for pain).    . bisoprolol (ZEBETA) 5 MG tablet Take 5 mg by mouth daily.    . metoprolol succinate (TOPROL-XL) 50 MG 24 hr tablet TAKE 1 TABLET BY MOUTH EVERY DAY **NEEDS OV** 15 tablet 0  . simvastatin (ZOCOR) 20 MG tablet Take 20 mg by mouth daily.    . lansoprazole (PREVACID) 15 MG capsule Take 1 capsule (15 mg total) by mouth daily at 12 noon. (Patient not taking: Reported on 10/08/2015) 30 capsule 0     Lab Results: UA: ND   Recent Labs  10/08/15 2211 10/08/15 2223 10/09/15 0850 10/10/15 0332  WBC 12.3*  --  12.0* 10.5  HGB 15.1 17.3* 15.9 14.7  HCT 46.0 51.0 48.1 44.7  PLT 320  --  296 290   BMET  Recent Labs  10/09/15 0519 10/10/15 0113 10/10/15 0332  NA 143 140 142  K 4.4 3.3* 3.8  CL 102 106 107  CO2 26 21* 25  GLUCOSE 111* 129* 124*  BUN 38* 45* 43*  CREATININE 2.50* 2.53* 2.50*  CALCIUM 10.0 8.4* 8.6*   LFT  Recent Labs  10/09/15 0519  PROT 6.6  ALBUMIN 3.6  AST 41  ALT 48  ALKPHOS 123  BILITOT 1.0   Dg Chest 2 View  10/10/2015  CLINICAL DATA:  CHF, chronic renal insufficiency EXAM: CHEST  2 VIEW COMPARISON:  PA and lateral chest x-ray of October 08, 2015 FINDINGS: The lungs are adequately inflated. The interstitial markings remain increased bilaterally. Areas of confluent alveolar opacity persist in the perihilar regions. There is mild tenting of the left hemidiaphragm. There is no pneumothorax. There are small bilateral pleural effusions blunting the posterior costophrenic angles. The mediastinum is normal in width. The bony thorax exhibits no acute abnormality. IMPRESSION: CHF with mild pulmonary interstitial edema and small  bilateral pleural effusions. There has not been significant change since yesterday's study. Electronically Signed   By: David  [place] M.D.   On: 10/10/2015 07:44   Dg Chest 2 View  10/08/2015  CLINICAL DATA:  Shortness of breath and chest pain EXAM: CHEST  2 VIEW COMPARISON:  08/30/2015 FINDINGS: Diffuse interstitial coarsening with fairly symmetric perihilar airspace opacity. There are Kerley lines best visualized in the lateral projection, where small pleural effusions are also seen bilaterally. Normal heart size and stable aortic and hilar contours. IMPRESSION: Pulmonary edema and small pleural effusions. Electronically Signed   By: Marnee SpringJonathon  Watts M.D.   On: 10/08/2015 22:56    ROS: No change vision + SOB No CP No abd pain No dysuria No new arthritic CO No new neuropathic Sxs  PHYSICAL EXAM: Blood pressure 101/75, pulse 85, temperature 98 F (36.7 C), temperature source Oral, resp. rate 22, height 5\' 6"  (1.676 m), weight 96.253 kg (212 lb 3.2 oz), SpO2 94 %. HEENT: PERRLA EOMI NECK:Mild JVD LUNGS:Bil crackles CARDIAC:RRR wo MRG ABD:+ BS NTND No HSM EXT:No CCE NEURO:CNI, Ox3, No astrerixis  Assessment: 1. CKD 3/4, Scr relatively stable 2. A fib with RVR on amio and heparin 3. Pulm edema 4. HTN with BP on soft side right now PLAN: 1. I would redose lasix later today 2. Discussed risks of heart cath.  30-40% chance that renal fx will worsen but return to baseline wo need for HD.  About 10% chance of requiring HD but if that were to happen, I would think there would be a good chance of recovery. 3. Check PTH level 4. Check PO4 level 5.   Daily Scr 6.   Check UA   Darcy Cordner T 10/10/2015, 1:14 PM

## 2015-10-10 NOTE — Progress Notes (Signed)
ANTICOAGULATION CONSULT NOTE - Follow Up Consult  Pharmacy Consult for Heparin Indication: atrial fibrillation  No Known Allergies  Patient Measurements: Height: 5\' 6"  (167.6 cm) Weight: 212 lb 3.2 oz (96.253 kg) IBW/kg (Calculated) : 63.8 Heparin Dosing Weight: 86kg  Vital Signs: Temp: 98 F (36.7 C) (12/20 0404) Temp Source: Oral (12/20 0404) BP: 97/65 mmHg (12/20 0404) Pulse Rate: 87 (12/20 0404)  Labs:  Recent Labs  10/08/15 2211 10/08/15 2223 10/08/15 2236 10/09/15 0519  10/09/15 0850 10/09/15 1032 10/09/15 1433 10/09/15 1816 10/09/15 2054 10/10/15 0113 10/10/15 0332 10/10/15 1105  HGB 15.1 17.3*  --   --   --  15.9  --   --   --   --   --  14.7  --   HCT 46.0 51.0  --   --   --  48.1  --   --   --   --   --  44.7  --   PLT 320  --   --   --   --  296  --   --   --   --   --  290  --   APTT  --   --  26  --   --   --   --   --   --   --   --   --   --   LABPROT  --   --  14.1  --   --   --   --   --   --   --   --   --   --   INR  --   --  1.07  --   --   --   --   --   --   --   --   --   --   HEPARINUNFRC  --   --   --   --   < > <0.10*  --   --  0.19*  --   --  0.40 0.41  CREATININE 2.22* 2.10*  --  2.50*  --   --   --   --   --   --  2.53* 2.50*  --   TROPONINI  --   --   --   --   --   --  8.56* 7.29*  --  7.01*  --   --   --   < > = values in this interval not displayed.  Estimated Creatinine Clearance: 29.4 mL/min (by C-G formula based on Cr of 2.5).   Medications:  Heparin @ 1650 units/hr  Assessment: 69yom continues on heparin, initially for NSTEMI, then developed new onset afib last night. Confirmatory heparin level is therapeutic at 0.41. CBC is stable. No bleeding reported. Noted plan for oral anticoagulation once cardiac workup complete.  Goal of Therapy:  Heparin level 0.3-0.7 units/ml Monitor platelets by anticoagulation protocol: Yes   Plan:  1) Continue heparin at 1650 units/hr 2) Follow up daily heparin level, CBC 3) Follow up oral  anticoagulation  Fredrik RiggerMarkle, Juaquina Machnik Sue 10/10/2015,12:41 PM

## 2015-10-10 NOTE — Progress Notes (Signed)
Patient continues to c/o pain 8/10, MD ordered 1 nitro SL, patient states pain is 7/10; BP 78/64. 250 ml bolus was administered per MD, BP back up to 103/71.  Cardizem initiated at 5 mg/hr after an hour RN titrated and currently going at 10 mg/hr, patient converted back to NSR HR in the 90's at 1258. Patient states, "it's getting better". RN will continue to monitor and notify MD of any new changes.   Nikki DomNguyen, Delois Silvester N, RN

## 2015-10-10 NOTE — Progress Notes (Signed)
TELEMETRY: Reviewed telemetry  Patient went into atrial fibrillation with RVR rates 160-180 last pm.  Filed Vitals:   10/10/15 0000 10/10/15 0013 10/10/15 0125 10/10/15 0404  BP: 108/81 78/64 103/71 97/65  Pulse:    87  Temp:    98 F (36.7 C)  TempSrc:    Oral  Resp:    22  Height:      Weight:    96.253 kg (212 lb 3.2 oz)  SpO2:    94%    Intake/Output Summary (Last 24 hours) at 10/10/15 1207 Last data filed at 10/10/15 1610  Gross per 24 hour  Intake 718.92 ml  Output   2000 ml  Net -1281.08 ml   Filed Weights   10/08/15 2319 10/09/15 0416 10/10/15 0404  Weight: 99.8 kg (220 lb 0.3 oz) 99.791 kg (220 lb) 96.253 kg (212 lb 3.2 oz)    Subjective Developed symptoms of mid back pain and acute SOB when he went into AFib last night. Feels better today. Still has chronic neck pain.  Marland Kitchen amiodarone  150 mg Intravenous Once  . aspirin EC  81 mg Oral Daily  . furosemide  80 mg Intravenous Once  . lamoTRIgine  200 mg Oral Daily  . metoprolol succinate  100 mg Oral Daily  . pantoprazole  40 mg Oral Q1200  . simvastatin  20 mg Oral Daily  . sodium chloride  3 mL Intravenous Q12H   . amiodarone     Followed by  . amiodarone    . heparin 1,650 Units/hr (10/10/15 0607)    LABS: Basic Metabolic Panel:  Recent Labs  96/04/54 0113 10/10/15 0332  NA 140 142  K 3.3* 3.8  CL 106 107  CO2 21* 25  GLUCOSE 129* 124*  BUN 45* 43*  CREATININE 2.53* 2.50*  CALCIUM 8.4* 8.6*  MG 2.0  --    Liver Function Tests:  Recent Labs  10/09/15 0519  AST 41  ALT 48  ALKPHOS 123  BILITOT 1.0  PROT 6.6  ALBUMIN 3.6   No results for input(s): LIPASE, AMYLASE in the last 72 hours. CBC:  Recent Labs  10/09/15 0850 10/10/15 0332  WBC 12.0* 10.5  HGB 15.9 14.7  HCT 48.1 44.7  MCV 86.8 86.8  PLT 296 290   Cardiac Enzymes:  Recent Labs  10/09/15 1032 10/09/15 1433 10/09/15 2054  TROPONINI 8.56* 7.29* 7.01*   BNP: No results for input(s): PROBNP in the last 72  hours. D-Dimer:  Recent Labs  10/08/15 2236  DDIMER 1.75*   Hemoglobin A1C: No results for input(s): HGBA1C in the last 72 hours. Fasting Lipid Panel: No results for input(s): CHOL, HDL, LDLCALC, TRIG, CHOLHDL, LDLDIRECT in the last 72 hours. Thyroid Function Tests:  Recent Labs  10/09/15 0519  TSH 4.015     Radiology/Studies:  Dg Chest 2 View  10/10/2015  CLINICAL DATA:  CHF, chronic renal insufficiency EXAM: CHEST  2 VIEW COMPARISON:  PA and lateral chest x-ray of October 08, 2015 FINDINGS: The lungs are adequately inflated. The interstitial markings remain increased bilaterally. Areas of confluent alveolar opacity persist in the perihilar regions. There is mild tenting of the left hemidiaphragm. There is no pneumothorax. There are small bilateral pleural effusions blunting the posterior costophrenic angles. The mediastinum is normal in width. The bony thorax exhibits no acute abnormality. IMPRESSION: CHF with mild pulmonary interstitial edema and small bilateral pleural effusions. There has not been significant change since yesterday's study. Electronically Signed   By: Onalee Hua  [place] M.D.   On: 10/10/2015 07:44   Dg Chest 2 View  10/08/2015  CLINICAL DATA:  Shortness of breath and chest pain EXAM: CHEST  2 VIEW COMPARISON:  08/30/2015 FINDINGS: Diffuse interstitial coarsening with fairly symmetric perihilar airspace opacity. There are Kerley lines best visualized in the lateral projection, where small pleural effusions are also seen bilaterally. Normal heart size and stable aortic and hilar contours. IMPRESSION: Pulmonary edema and small pleural effusions. Electronically Signed   By: Marnee SpringJonathon  Watts M.D.   On: 10/08/2015 22:56   Ecg last pm showed Afib with RVR rate 172. Marked ST depression in septal leads.   Echo: Study Conclusions  - Left ventricle: The cavity size was normal. Wall thickness was normal. Systolic function was normal. The estimated ejection fraction was  in the range of 60% to 65%. - Mitral valve: MV not well seen. Appeas to be eccentric MR directed posteriorly. Cannot see prolaspe. Overall mild to moderate but consider TEE if clinically indicated to further evaluate given poor image qualtity - Left atrium: The atrium was mildly dilated. - Atrial septum: No defect or patent foramen ovale was identified  PHYSICAL EXAM General: Well developed, obese, in no acute distress. Head: Normocephalic, atraumatic, sclera non-icteric, oropharynx is clear Neck: Negative for carotid bruits. JVD not elevated. No adenopathy Lungs: Mild right basilar crackles.  Heart: RRR S1 S2  rubs, or gallops. Soft 1/6 systolic murmur. Abdomen: Soft, non-tender, non-distended with normoactive bowel sounds. No hepatomegaly. No rebound/guarding. No obvious abdominal masses. Msk:  Strength and tone appears normal for age. Extremities: No clubbing, cyanosis or edema.  Distal pedal pulses are 2+ and equal bilaterally. Neuro: Alert and oriented X 3. Moves all extremities spontaneously. Psych:  Responds to questions appropriately with a normal affect.   ASSESSMENT AND PLAN: 1. Acute diastolic CHF. Etiology is AFib with RVR and ischemia. He has mild to moderate MR by Echo but murmur is really minimal. Ecg with Afib shows significant septal ischemia and troponins are significantly elevated.  Doubt RAS with normal Renal MRA in 2014.  Will diurese today with IV lasix 80 mg IV x 1. CXR still shows edema-possibly exacerbated by afib last night. I/O is negative 1500 cc. Weight is down. Renal function is unchanged. Watch renal function closely. Not a candidate for ACEi/ARB due to CKD. Continue beta blocker. 2. NSTEMI this may be related to demand ischemia in the setting of acute pulmonary edema, Afib,  and CKD. Ecg changes and troponin elevation are very concerning for CAD.  Continue ASA and IV heparin for now. I have recommended he have a right and left heart cath.  Will ask  renal to see today. Will try and minimize dye load with CKD and start hydration tonight. Plan femoral approach since radial pulse is very poor.  3. Atrial fibrillation with RVR. Poorly tolerated. Need to start antiarrhythmic drug therapy. With CHF and CKD he is not a candidate for Flecainide, Tikosyn, Sotalol. Will start amiodarone with IV load. Continue po metoprolol. Stop IV diltiazem with low BP. Patient's [place] vasc score is 4. He will need long term anticoagulation long term. Will decide once cardiac work up complete. 4. CKD stage 4. Renal to see today. 5. History of gastritis in July 2016 secondary to ASA and Goody's powders. Counseled on use of only 81 mg ASA daily. Depending on results of cath may use anticoagulation only.  6. Chronic cevicalgia 7. Sleep apnea.  8. HTN controlled.  Present on Admission:  . Acute diastolic (  congestive) heart failure (HCC) . Demand ischemia (HCC) . Stage III chronic kidney disease . Cervical spondylosis with radiculopathy . Hypertensive heart disease . Hyperlipidemia  Signed, Peter [place], MDFACC 10/10/2015 12:07 PM

## 2015-10-10 NOTE — Progress Notes (Signed)
ANTICOAGULATION CONSULT NOTE - Follow Up Consult  Pharmacy Consult for heparin Indication: atrial fibrillation   Labs:  Recent Labs  10/08/15 2211 10/08/15 2223 10/08/15 2236 10/09/15 0519 10/09/15 0850 10/09/15 1032 10/09/15 1433 10/09/15 1816 10/09/15 2054 10/10/15 0113 10/10/15 0332  HGB 15.1 17.3*  --   --  15.9  --   --   --   --   --  14.7  HCT 46.0 51.0  --   --  48.1  --   --   --   --   --  44.7  PLT 320  --   --   --  296  --   --   --   --   --  290  APTT  --   --  26  --   --   --   --   --   --   --   --   LABPROT  --   --  14.1  --   --   --   --   --   --   --   --   INR  --   --  1.07  --   --   --   --   --   --   --   --   HEPARINUNFRC  --   --   --   --  <0.10*  --   --  0.19*  --   --  0.40  CREATININE 2.22* 2.10*  --  2.50*  --   --   --   --   --  2.53* 2.50*  TROPONINI  --   --   --   --   --  8.56* 7.29*  --  7.01*  --   --      Assessment/Plan:  69yo male therapeutic on heparin after rate increases; now dosed for Afib. Will continue gtt at current rate and confirm stable with additional level.   Vernard GamblesVeronda Genevieve Arbaugh, PharmD, BCPS  10/10/2015,4:07 AM

## 2015-10-10 NOTE — Progress Notes (Signed)
CCMD notified that patient was having SVT HR in the 170-180's; patient is in Afib with RVR confirmed by EKG. Patient VSS and complains of excruciating back pain at this time. PRN pain med administered and MD on call made aware of changes, orders given to start cardizem IV. RN will continue to monitor.  Nikki DomNguyen, Tylan Kinn N, RN

## 2015-10-11 ENCOUNTER — Encounter (HOSPITAL_COMMUNITY)
Admission: EM | Disposition: A | Discharge status: Skilled Nursing Facility | Payer: Self-pay | Source: Home / Self Care | Attending: Surgery

## 2015-10-11 ENCOUNTER — Other Ambulatory Visit: Payer: Self-pay | Admitting: *Deleted

## 2015-10-11 DIAGNOSIS — I25110 Atherosclerotic heart disease of native coronary artery with unstable angina pectoris: Secondary | ICD-10-CM

## 2015-10-11 DIAGNOSIS — N183 Chronic kidney disease, stage 3 (moderate): Secondary | ICD-10-CM

## 2015-10-11 DIAGNOSIS — I249 Acute ischemic heart disease, unspecified: Secondary | ICD-10-CM | POA: Insufficient documentation

## 2015-10-11 DIAGNOSIS — I509 Heart failure, unspecified: Secondary | ICD-10-CM | POA: Insufficient documentation

## 2015-10-11 DIAGNOSIS — I2510 Atherosclerotic heart disease of native coronary artery without angina pectoris: Secondary | ICD-10-CM

## 2015-10-11 DIAGNOSIS — I214 Non-ST elevation (NSTEMI) myocardial infarction: Principal | ICD-10-CM

## 2015-10-11 HISTORY — PX: CARDIAC CATHETERIZATION: SHX172

## 2015-10-11 LAB — BASIC METABOLIC PANEL
ANION GAP: 12 (ref 5–15)
BUN: 42 mg/dL — AB (ref 6–20)
CALCIUM: 8.7 mg/dL — AB (ref 8.9–10.3)
CO2: 26 mmol/L (ref 22–32)
CREATININE: 2.54 mg/dL — AB (ref 0.61–1.24)
Chloride: 104 mmol/L (ref 101–111)
GFR, EST AFRICAN AMERICAN: 28 mL/min — AB (ref >60)
GFR, EST NON AFRICAN AMERICAN: 24 mL/min — AB (ref >60)
Glucose, Bld: 113 mg/dL — ABNORMAL HIGH (ref 65–99)
POTASSIUM: 3.7 mmol/L (ref 3.5–5.1)
SODIUM: 142 mmol/L (ref 135–145)

## 2015-10-11 LAB — POCT I-STAT 3, ART BLOOD GAS (G3+)
ACID-BASE DEFICIT: 1 mmol/L (ref 0.0–2.0)
Bicarbonate: 21.3 mEq/L (ref 20.0–24.0)
O2 SAT: 93 %
PO2 ART: 61 mmHg — AB (ref 80.0–100.0)
TCO2: 22 mmol/L (ref 0–100)
pCO2 arterial: 27.8 mmHg — ABNORMAL LOW (ref 35.0–45.0)
pH, Arterial: 7.493 — ABNORMAL HIGH (ref 7.350–7.450)

## 2015-10-11 LAB — CBC
HCT: 47.3 % (ref 39.0–52.0)
Hemoglobin: 15.5 g/dL (ref 13.0–17.0)
MCH: 29 pg (ref 26.0–34.0)
MCHC: 32.8 g/dL (ref 30.0–36.0)
MCV: 88.6 fL (ref 78.0–100.0)
PLATELETS: 319 10*3/uL (ref 150–400)
RBC: 5.34 MIL/uL (ref 4.22–5.81)
RDW: 17.8 % — AB (ref 11.5–15.5)
WBC: 12.3 10*3/uL — AB (ref 4.0–10.5)

## 2015-10-11 LAB — SURGICAL PCR SCREEN
MRSA, PCR: NEGATIVE
Staphylococcus aureus: NEGATIVE

## 2015-10-11 LAB — POCT I-STAT 3, VENOUS BLOOD GAS (G3P V)
Acid-Base Excess: 1 mmol/L (ref 0.0–2.0)
BICARBONATE: 25.1 meq/L — AB (ref 20.0–24.0)
O2 SAT: 56 %
PH VEN: 7.42 — AB (ref 7.250–7.300)
TCO2: 26 mmol/L (ref 0–100)
pCO2, Ven: 38.7 mmHg — ABNORMAL LOW (ref 45.0–50.0)
pO2, Ven: 29 mmHg — CL (ref 30.0–45.0)

## 2015-10-11 LAB — PHOSPHORUS: Phosphorus: 3.3 mg/dL (ref 2.5–4.6)

## 2015-10-11 LAB — HEPARIN LEVEL (UNFRACTIONATED): HEPARIN UNFRACTIONATED: 0.56 [IU]/mL (ref 0.30–0.70)

## 2015-10-11 SURGERY — RIGHT/LEFT HEART CATH AND CORONARY ANGIOGRAPHY

## 2015-10-11 MED ORDER — ONDANSETRON HCL 4 MG/2ML IJ SOLN: 4.0000 mg | Freq: Four times a day (QID) | INTRAMUSCULAR | Status: DC | PRN

## 2015-10-11 MED ORDER — NITROGLYCERIN 0.4 MG SL SUBL
SUBLINGUAL_TABLET | SUBLINGUAL | Status: AC
  Administered 2015-10-11: 22:00:00
  Filled 2015-10-11: qty 1

## 2015-10-11 MED ORDER — VERAPAMIL HCL 2.5 MG/ML IV SOLN
INTRAVENOUS | Status: AC
  Filled 2015-10-11: qty 2

## 2015-10-11 MED ORDER — ACETAMINOPHEN 325 MG PO TABS
650.0000 mg | ORAL_TABLET | ORAL | Status: DC | PRN
  Administered 2015-10-11 – 2015-10-12 (×4): 650 mg via ORAL
  Filled 2015-10-11 (×4): qty 2

## 2015-10-11 MED ORDER — SODIUM CHLORIDE 0.9 % IV SOLN
INTRAVENOUS | Status: DC
  Administered 2015-10-11: 250 mL via INTRAVENOUS

## 2015-10-11 MED ORDER — FUROSEMIDE 10 MG/ML IJ SOLN
80.0000 mg | Freq: Two times a day (BID) | INTRAMUSCULAR | Status: DC
  Administered 2015-10-11 – 2015-10-12 (×3): 80 mg via INTRAVENOUS
  Filled 2015-10-11 (×3): qty 8

## 2015-10-11 MED ORDER — HEPARIN (PORCINE) IN NACL 100-0.45 UNIT/ML-% IJ SOLN
1650.0000 [IU]/h | INTRAMUSCULAR | Status: DC
  Administered 2015-10-12 (×2): 1650 [IU]/h via INTRAVENOUS
  Filled 2015-10-11 (×2): qty 250

## 2015-10-11 MED ORDER — SODIUM CHLORIDE 0.9 % WEIGHT BASED INFUSION
1.0000 mL/kg/h | INTRAVENOUS | Status: DC
  Administered 2015-10-11: 1 mL/kg/h via INTRAVENOUS

## 2015-10-11 MED ORDER — LIDOCAINE HCL (PF) 1 % IJ SOLN
INTRAMUSCULAR | Status: AC
  Filled 2015-10-11: qty 30

## 2015-10-11 MED ORDER — HEPARIN (PORCINE) IN NACL 2-0.9 UNIT/ML-% IJ SOLN
INTRAMUSCULAR | Status: DC | PRN
  Administered 2015-10-11: 13:00:00

## 2015-10-11 MED ORDER — SODIUM CHLORIDE 0.9 % IV SOLN
INTRAVENOUS | Status: DC
Start: 2015-10-11 — End: 2015-10-12
  Administered 2015-10-11 (×2): via INTRAVENOUS

## 2015-10-11 MED ORDER — MIDAZOLAM HCL 2 MG/2ML IJ SOLN
INTRAMUSCULAR | Status: DC | PRN
  Administered 2015-10-11: 1 mg via INTRAVENOUS

## 2015-10-11 MED ORDER — SODIUM CHLORIDE 0.9 % IJ SOLN: 3.0000 mL | INTRAMUSCULAR | Status: DC | PRN

## 2015-10-11 MED ORDER — FENTANYL CITRATE (PF) 100 MCG/2ML IJ SOLN
INTRAMUSCULAR | Status: DC | PRN
  Administered 2015-10-11: 25 ug via INTRAVENOUS

## 2015-10-11 MED ORDER — NITROGLYCERIN 0.4 MG SL SUBL
SUBLINGUAL_TABLET | SUBLINGUAL | Status: AC
  Administered 2015-10-11: 0.4 mg
  Filled 2015-10-11: qty 2

## 2015-10-11 MED ORDER — SODIUM CHLORIDE 0.9 % IJ SOLN: 3.0000 mL | Freq: Two times a day (BID) | INTRAMUSCULAR | Status: DC

## 2015-10-11 MED ORDER — HEPARIN SODIUM (PORCINE) 1000 UNIT/ML IJ SOLN
INTRAMUSCULAR | Status: AC
  Filled 2015-10-11: qty 1

## 2015-10-11 MED ORDER — SODIUM CHLORIDE 0.9 % IV SOLN: 250.0000 mL | INTRAVENOUS | Status: DC | PRN

## 2015-10-11 MED ORDER — MIDAZOLAM HCL 2 MG/2ML IJ SOLN
INTRAMUSCULAR | Status: AC
  Filled 2015-10-11: qty 2

## 2015-10-11 MED ORDER — NITROGLYCERIN 0.4 MG SL SUBL
SUBLINGUAL_TABLET | SUBLINGUAL | Status: AC
  Administered 2015-10-11: 0.4 mg
  Filled 2015-10-11: qty 1

## 2015-10-11 MED ORDER — FENTANYL CITRATE (PF) 100 MCG/2ML IJ SOLN
INTRAMUSCULAR | Status: AC
  Filled 2015-10-11: qty 2

## 2015-10-11 MED ORDER — HEPARIN SODIUM (PORCINE) 1000 UNIT/ML IJ SOLN
INTRAMUSCULAR | Status: DC | PRN
  Administered 2015-10-11: 4500 [IU] via INTRAVENOUS

## 2015-10-11 MED ORDER — SODIUM CHLORIDE 0.9 % WEIGHT BASED INFUSION: 1.0000 mL/kg/h | INTRAVENOUS | Status: AC

## 2015-10-11 MED ORDER — HEPARIN (PORCINE) IN NACL 2-0.9 UNIT/ML-% IJ SOLN
INTRAMUSCULAR | Status: AC
  Filled 2015-10-11: qty 1000

## 2015-10-11 MED ORDER — VERAPAMIL HCL 2.5 MG/ML IV SOLN
INTRAVENOUS | Status: DC | PRN
  Administered 2015-10-11: 13:00:00 via INTRA_ARTERIAL

## 2015-10-11 SURGICAL SUPPLY — 12 items
CATH BALLN WEDGE 5F 110CM (CATHETERS) ×2 IMPLANT
CATH INFINITI 5 FR JL3.5 (CATHETERS) ×3 IMPLANT
CATH INFINITI JR4 5F (CATHETERS) ×3 IMPLANT
DEVICE RAD COMP TR BAND LRG (VASCULAR PRODUCTS) ×3 IMPLANT
GLIDESHEATH SLEND A-KIT 6F 22G (SHEATH) ×3 IMPLANT
KIT HEART LEFT (KITS) ×3 IMPLANT
PACK CARDIAC CATHETERIZATION (CUSTOM PROCEDURE TRAY) ×3 IMPLANT
SHEATH FAST CATH BRACH 5F 5CM (SHEATH) ×2 IMPLANT
TRANSDUCER W/STOPCOCK (MISCELLANEOUS) ×6 IMPLANT
TUBING CIL FLEX 10 FLL-RA (TUBING) ×3 IMPLANT
WIRE HI TORQ VERSACORE-J 145CM (WIRE) ×2 IMPLANT
WIRE SAFE-T 1.5MM-J .035X260CM (WIRE) ×3 IMPLANT

## 2015-10-11 NOTE — H&P (View-Only) (Signed)
 TELEMETRY: Reviewed telemetry  NSR. No recurrent afib last 24 hours.  Filed Vitals:   10/10/15 2046 10/11/15 0422 10/11/15 0503 10/11/15 0535  BP: 109/78 95/67    Pulse: 88 90    Temp: 97.7 F (36.5 C) 97.5 F (36.4 C)    TempSrc: Oral Oral    Resp: 18 18    Height:      Weight:   96.6 kg (212 lb 15.4 oz)   SpO2: 98% 94%  100%    Intake/Output Summary (Last 24 hours) at 10/11/15 1023 Last data filed at 10/11/15 0146  Gross per 24 hour  Intake    480 ml  Output    825 ml  Net   -345 ml   Filed Weights   10/09/15 0416 10/10/15 0404 10/11/15 0503  Weight: 99.791 kg (220 lb) 96.253 kg (212 lb 3.2 oz) 96.6 kg (212 lb 15.4 oz)    Subjective Didn't sleep well. Complained of SOB. No chest pain.   . aspirin EC  81 mg Oral Daily  . furosemide  80 mg Intravenous BID  . lamoTRIgine  200 mg Oral Daily  . metoprolol succinate  100 mg Oral Daily  . pantoprazole  40 mg Oral Q1200  . simvastatin  20 mg Oral Daily  . sodium chloride  3 mL Intravenous Q12H  . sodium chloride  3 mL Intravenous Q12H   . sodium chloride 10 mL/hr (10/11/15 0745)  . amiodarone 30 mg/hr (10/11/15 0330)  . heparin 1,650 Units/hr (10/10/15 2255)    LABS: Basic Metabolic Panel:  Recent Labs  10/10/15 0113 10/10/15 0332 10/11/15 0422  NA 140 142 142  K 3.3* 3.8 3.7  CL 106 107 104  CO2 21* 25 26  GLUCOSE 129* 124* 113*  BUN 45* 43* 42*  CREATININE 2.53* 2.50* 2.54*  CALCIUM 8.4* 8.6* 8.7*  MG 2.0  --   --   PHOS  --   --  3.3   Liver Function Tests:  Recent Labs  10/09/15 0519  AST 41  ALT 48  ALKPHOS 123  BILITOT 1.0  PROT 6.6  ALBUMIN 3.6   No results for input(s): LIPASE, AMYLASE in the last 72 hours. CBC:  Recent Labs  10/10/15 0332 10/11/15 0422  WBC 10.5 12.3*  HGB 14.7 15.5  HCT 44.7 47.3  MCV 86.8 88.6  PLT 290 319   Cardiac Enzymes:  Recent Labs  10/09/15 1032 10/09/15 1433 10/09/15 2054  TROPONINI 8.56* 7.29* 7.01*   BNP: No results for input(s):  PROBNP in the last 72 hours. D-Dimer:  Recent Labs  10/08/15 2236  DDIMER 1.75*   Hemoglobin A1C: No results for input(s): HGBA1C in the last 72 hours. Fasting Lipid Panel: No results for input(s): CHOL, HDL, LDLCALC, TRIG, CHOLHDL, LDLDIRECT in the last 72 hours. Thyroid Function Tests:  Recent Labs  10/09/15 0519  TSH 4.015     Radiology/Studies:  Dg Chest 2 View  10/10/2015  CLINICAL DATA:  CHF, chronic renal insufficiency EXAM: CHEST  2 VIEW COMPARISON:  PA and lateral chest x-ray of October 08, 2015 FINDINGS: The lungs are adequately inflated. The interstitial markings remain increased bilaterally. Areas of confluent alveolar opacity persist in the perihilar regions. There is mild tenting of the left hemidiaphragm. There is no pneumothorax. There are small bilateral pleural effusions blunting the posterior costophrenic angles. The mediastinum is normal in width. The bony thorax exhibits no acute abnormality. IMPRESSION: CHF with mild pulmonary interstitial edema and small bilateral pleural effusions.   There has not been significant change since yesterday's study. Electronically Signed   By: David  [place] M.D.   On: 10/10/2015 07:44   Ecg last pm showed Afib with RVR rate 172. Marked ST depression in septal leads.   Echo: Study Conclusions  - Left ventricle: The cavity size was normal. Wall thickness was normal. Systolic function was normal. The estimated ejection fraction was in the range of 60% to 65%. - Mitral valve: MV not well seen. Appeas to be eccentric MR directed posteriorly. Cannot see prolaspe. Overall mild to moderate but consider TEE if clinically indicated to further evaluate given poor image qualtity - Left atrium: The atrium was mildly dilated. - Atrial septum: No defect or patent foramen ovale was identified  PHYSICAL EXAM General: Well developed, obese, in no acute distress. Head: Normocephalic, atraumatic, sclera non-icteric, oropharynx is  clear Neck: Negative for carotid bruits. JVD not elevated. No adenopathy Lungs: Mild bibasilar crackles.  Heart: RRR S1 S2  rubs, or gallops. Soft 1/6 systolic murmur. Abdomen: Soft, non-tender, non-distended with normoactive bowel sounds. No hepatomegaly. No rebound/guarding. No obvious abdominal masses. Msk:  Strength and tone appears normal for age. Extremities: No clubbing, cyanosis or edema.  Distal pedal pulses are 2+ and equal bilaterally. Neuro: Alert and oriented X 3. Moves all extremities spontaneously. Psych:  Responds to questions appropriately with a normal affect.   ASSESSMENT AND PLAN: 1. Acute diastolic CHF. Etiology is AFib with RVR and ischemia. He has mild to moderate MR by Echo but murmur is really minimal. Ecg with Afib shows significant septal ischemia and troponins are significantly elevated.  Doubt RAS with normal Renal MRA in 2014.  Diuretics on hold now in anticipation of cardiac cath. Will need to resume IV diuretics post cath.  Watch renal function closely. Not a candidate for ACEi/ARB due to CKD. Continue beta blocker. 2. NSTEMI this may be related to demand ischemia in the setting of acute pulmonary edema, Afib,  and CKD. Ecg changes and troponin elevation are very concerning for CAD.  Continue ASA and IV heparin for now. For right and left heart cath today- cors only.  Appreciate renal input.  Will try and minimize dye load with CKD. Plan femoral approach since radial pulse is very poor.  3. Atrial fibrillation with RVR. Poorly tolerated. Need to start antiarrhythmic drug therapy. With CHF and CKD he is not a candidate for Flecainide, Tikosyn, Sotalol. No recurrence on IV amiodarone. Will transition to po today. Continue po metoprolol.  Patient's [place] vasc score is 4. He will need long term anticoagulation long term. Will decide once cardiac work up complete. Given his advanced renal failure he is probably not be a good candidate for a NOAC and would consider starting  coumadin. 4. CKD stage 4. 5. History of gastritis in July 2016 secondary to ASA and Goody's powders. Counseled on use of only 81 mg ASA daily. Depending on results of cath may use anticoagulation only.  6. Chronic cevicalgia 7. Sleep apnea.  8. HTN controlled.  Present on Admission:  . Acute diastolic (congestive) heart failure (HCC) . Demand ischemia (HCC) . Stage III chronic kidney disease . Cervical spondylosis with radiculopathy . Hypertensive heart disease . Hyperlipidemia . Non-STEMI (non-ST elevated myocardial infarction) (HCC)  Signed, Teegan Guinther [place], MDFACC 10/11/2015 10:23 AM

## 2015-10-11 NOTE — Progress Notes (Signed)
ANTICOAGULATION CONSULT NOTE - Follow Up Consult  Pharmacy Consult for Heparin Indication: atrial fibrillation  No Known Allergies  Patient Measurements: Height: 5\' 6"  (167.6 cm) Weight: 212 lb 15.4 oz (96.6 kg) IBW/kg (Calculated) : 63.8 Heparin Dosing Weight: 86kg  Vital Signs: Temp: 97.5 F (36.4 C) (12/21 0422) Temp Source: Oral (12/21 0422) BP: 95/67 mmHg (12/21 0422) Pulse Rate: 90 (12/21 0422)  Labs:  Recent Labs  10/08/15 2236  10/09/15 0850 10/09/15 1032 10/09/15 1433  10/09/15 2054 10/10/15 0113 10/10/15 0332 10/10/15 1105 10/11/15 0422  HGB  --   --  15.9  --   --   --   --   --  14.7  --  15.5  HCT  --   --  48.1  --   --   --   --   --  44.7  --  47.3  PLT  --   --  296  --   --   --   --   --  290  --  319  APTT 26  --   --   --   --   --   --   --   --   --   --   LABPROT 14.1  --   --   --   --   --   --   --   --   --   --   INR 1.07  --   --   --   --   --   --   --   --   --   --   HEPARINUNFRC  --   --  <0.10*  --   --   < >  --   --  0.40 0.41 0.56  CREATININE  --   < >  --   --   --   --   --  2.53* 2.50*  --  2.54*  TROPONINI  --   --   --  8.56* 7.29*  --  7.01*  --   --   --   --   < > = values in this interval not displayed.  Estimated Creatinine Clearance: 29 mL/min (by C-G formula based on Cr of 2.54).   Medications:  Heparin @ 1650 units/hr  Assessment: 69yom continues on heparin, initially for NSTEMI, then developed new onset afib last night. Heparin level remains therapeutic at 0.56. CBC is stable. No bleeding reported.  Goal of Therapy:  Heparin level 0.3-0.7 units/ml Monitor platelets by anticoagulation protocol: Yes   Plan:  - Continue heparin gtt at 1650 units/hr - Daily heparin level and CBC - [facility]  Lysle Pearlachel Glenda Kunst, PharmD, BCPS Pager # [phone] 10/11/2015 8:23 AM

## 2015-10-11 NOTE — Progress Notes (Signed)
Assessment entered and completed, verified and agreed to by this RN.

## 2015-10-11 NOTE — Progress Notes (Signed)
ANTICOAGULATION CONSULT NOTE - Follow Up Consult  Pharmacy Consult for Heparin Indication: atrial fibrillation  No Known Allergies  Patient Measurements: Height: 5\' 6"  (167.6 cm) Weight: 212 lb 15.4 oz (96.6 kg) IBW/kg (Calculated) : 63.8 Heparin Dosing Weight: 86kg  Vital Signs: Temp: 97.5 F (36.4 C) (12/21 0422) Temp Source: Oral (12/21 0422) BP: 128/65 mmHg (12/21 1326) Pulse Rate: 87 (12/21 1326)  Labs:  Recent Labs  10/08/15 2236  10/09/15 0850 10/09/15 1032 10/09/15 1433  10/09/15 2054 10/10/15 0113 10/10/15 0332 10/10/15 1105 10/11/15 0422  HGB  --   --  15.9  --   --   --   --   --  14.7  --  15.5  HCT  --   --  48.1  --   --   --   --   --  44.7  --  47.3  PLT  --   --  296  --   --   --   --   --  290  --  319  APTT 26  --   --   --   --   --   --   --   --   --   --   LABPROT 14.1  --   --   --   --   --   --   --   --   --   --   INR 1.07  --   --   --   --   --   --   --   --   --   --   HEPARINUNFRC  --   --  <0.10*  --   --   < >  --   --  0.40 0.41 0.56  CREATININE  --   < >  --   --   --   --   --  2.53* 2.50*  --  2.54*  TROPONINI  --   --   --  8.56* 7.29*  --  7.01*  --   --   --   --   < > = values in this interval not displayed.  Estimated Creatinine Clearance: 29 mL/min (by C-G formula based on Cr of 2.54).  Assessment: 69yom continues previously on heparin, now s/p cardiac cath which was reported with severe 3v disease to resume heparin 8 hours post-sheath removal. He was previously therapeutic on 1650 units/hr.   Goal of Therapy:  Heparin level 0.3-0.7 units/ml Monitor platelets by anticoagulation protocol: Yes   Plan:  - Restart heparin tonight at 2130 at 1650 units/hr - Check an 8 hour heparin level - Daily heparin level and CBC - F/u surgery plans  Lysle Pearlachel Brya Simerly, PharmD, BCPS Pager # [phone] 10/11/2015 1:58 PM

## 2015-10-11 NOTE — Progress Notes (Signed)
TELEMETRY: Reviewed telemetry  NSR. No recurrent afib last 24 hours.  Filed Vitals:   10/10/15 2046 10/11/15 0422 10/11/15 0503 10/11/15 0535  BP: 109/78 95/67    Pulse: 88 90    Temp: 97.7 F (36.5 C) 97.5 F (36.4 C)    TempSrc: Oral Oral    Resp: 18 18    Height:      Weight:   96.6 kg (212 lb 15.4 oz)   SpO2: 98% 94%  100%    Intake/Output Summary (Last 24 hours) at 10/11/15 1023 Last data filed at 10/11/15 0146  Gross per 24 hour  Intake    480 ml  Output    825 ml  Net   -345 ml   Filed Weights   10/09/15 0416 10/10/15 0404 10/11/15 0503  Weight: 99.791 kg (220 lb) 96.253 kg (212 lb 3.2 oz) 96.6 kg (212 lb 15.4 oz)    Subjective Didn't sleep well. Complained of SOB. No chest pain.   Marland Kitchen aspirin EC  81 mg Oral Daily  . furosemide  80 mg Intravenous BID  . lamoTRIgine  200 mg Oral Daily  . metoprolol succinate  100 mg Oral Daily  . pantoprazole  40 mg Oral Q1200  . simvastatin  20 mg Oral Daily  . sodium chloride  3 mL Intravenous Q12H  . sodium chloride  3 mL Intravenous Q12H   . sodium chloride 10 mL/hr (10/11/15 0745)  . amiodarone 30 mg/hr (10/11/15 0330)  . heparin 1,650 Units/hr (10/10/15 2255)    LABS: Basic Metabolic Panel:  Recent Labs  16/10/96 0113 10/10/15 0332 10/11/15 0422  NA 140 142 142  K 3.3* 3.8 3.7  CL 106 107 104  CO2 21* 25 26  GLUCOSE 129* 124* 113*  BUN 45* 43* 42*  CREATININE 2.53* 2.50* 2.54*  CALCIUM 8.4* 8.6* 8.7*  MG 2.0  --   --   PHOS  --   --  3.3   Liver Function Tests:  Recent Labs  10/09/15 0519  AST 41  ALT 48  ALKPHOS 123  BILITOT 1.0  PROT 6.6  ALBUMIN 3.6   No results for input(s): LIPASE, AMYLASE in the last 72 hours. CBC:  Recent Labs  10/10/15 0332 10/11/15 0422  WBC 10.5 12.3*  HGB 14.7 15.5  HCT 44.7 47.3  MCV 86.8 88.6  PLT 290 319   Cardiac Enzymes:  Recent Labs  10/09/15 1032 10/09/15 1433 10/09/15 2054  TROPONINI 8.56* 7.29* 7.01*   BNP: No results for input(s):  PROBNP in the last 72 hours. D-Dimer:  Recent Labs  10/08/15 2236  DDIMER 1.75*   Hemoglobin A1C: No results for input(s): HGBA1C in the last 72 hours. Fasting Lipid Panel: No results for input(s): CHOL, HDL, LDLCALC, TRIG, CHOLHDL, LDLDIRECT in the last 72 hours. Thyroid Function Tests:  Recent Labs  10/09/15 0519  TSH 4.015     Radiology/Studies:  Dg Chest 2 View  10/10/2015  CLINICAL DATA:  CHF, chronic renal insufficiency EXAM: CHEST  2 VIEW COMPARISON:  PA and lateral chest x-ray of October 08, 2015 FINDINGS: The lungs are adequately inflated. The interstitial markings remain increased bilaterally. Areas of confluent alveolar opacity persist in the perihilar regions. There is mild tenting of the left hemidiaphragm. There is no pneumothorax. There are small bilateral pleural effusions blunting the posterior costophrenic angles. The mediastinum is normal in width. The bony thorax exhibits no acute abnormality. IMPRESSION: CHF with mild pulmonary interstitial edema and small bilateral pleural effusions.  There has not been significant change since yesterday's study. Electronically Signed   By: David  SwazilandJordan M.D.   On: 10/10/2015 07:44   Ecg last pm showed Afib with RVR rate 172. Marked ST depression in septal leads.   Echo: Study Conclusions  - Left ventricle: The cavity size was normal. Wall thickness was normal. Systolic function was normal. The estimated ejection fraction was in the range of 60% to 65%. - Mitral valve: MV not well seen. Appeas to be eccentric MR directed posteriorly. Cannot see prolaspe. Overall mild to moderate but consider TEE if clinically indicated to further evaluate given poor image qualtity - Left atrium: The atrium was mildly dilated. - Atrial septum: No defect or patent foramen ovale was identified  PHYSICAL EXAM General: Well developed, obese, in no acute distress. Head: Normocephalic, atraumatic, sclera non-icteric, oropharynx is  clear Neck: Negative for carotid bruits. JVD not elevated. No adenopathy Lungs: Mild bibasilar crackles.  Heart: RRR S1 S2  rubs, or gallops. Soft 1/6 systolic murmur. Abdomen: Soft, non-tender, non-distended with normoactive bowel sounds. No hepatomegaly. No rebound/guarding. No obvious abdominal masses. Msk:  Strength and tone appears normal for age. Extremities: No clubbing, cyanosis or edema.  Distal pedal pulses are 2+ and equal bilaterally. Neuro: Alert and oriented X 3. Moves all extremities spontaneously. Psych:  Responds to questions appropriately with a normal affect.   ASSESSMENT AND PLAN: 1. Acute diastolic CHF. Etiology is AFib with RVR and ischemia. He has mild to moderate MR by Echo but murmur is really minimal. Ecg with Afib shows significant septal ischemia and troponins are significantly elevated.  Doubt RAS with normal Renal MRA in 2014.  Diuretics on hold now in anticipation of cardiac cath. Will need to resume IV diuretics post cath.  Watch renal function closely. Not a candidate for ACEi/ARB due to CKD. Continue beta blocker. 2. NSTEMI this may be related to demand ischemia in the setting of acute pulmonary edema, Afib,  and CKD. Ecg changes and troponin elevation are very concerning for CAD.  Continue ASA and IV heparin for now. For right and left heart cath today- cors only.  Appreciate renal input.  Will try and minimize dye load with CKD. Plan femoral approach since radial pulse is very poor.  3. Atrial fibrillation with RVR. Poorly tolerated. Need to start antiarrhythmic drug therapy. With CHF and CKD he is not a candidate for Flecainide, Tikosyn, Sotalol. No recurrence on IV amiodarone. Will transition to po today. Continue po metoprolol.  Patient's [place] vasc score is 4. He will need long term anticoagulation long term. Will decide once cardiac work up complete. Given his advanced renal failure he is probably not be a good candidate for a NOAC and would consider starting  coumadin. 4. CKD stage 4. 5. History of gastritis in July 2016 secondary to ASA and Goody's powders. Counseled on use of only 81 mg ASA daily. Depending on results of cath may use anticoagulation only.  6. Chronic cevicalgia 7. Sleep apnea.  8. HTN controlled.  Present on Admission:  . Acute diastolic (congestive) heart failure (HCC) . Demand ischemia (HCC) . Stage III chronic kidney disease . Cervical spondylosis with radiculopathy . Hypertensive heart disease . Hyperlipidemia . Non-STEMI (non-ST elevated myocardial infarction) (HCC)  Signed, Peter [place]Jordan, MDFACC 10/11/2015 10:23 AM

## 2015-10-11 NOTE — Progress Notes (Signed)
S: Unable to lie flat due to SOB O:BP 95/67 mmHg  Pulse 90  Temp(Src) 97.5 F (36.4 C) (Oral)  Resp 18  Ht 5\' 6"  (1.676 m)  Wt 96.6 kg (212 lb 15.4 oz)  BMI 34.39 kg/m2  SpO2 100%  Intake/Output Summary (Last 24 hours) at 10/11/15 0727 Last data filed at 10/11/15 0146  Gross per 24 hour  Intake    480 ml  Output   1275 ml  Net   -795 ml   Weight change: 0.347 kg (12.2 oz) [id number]:Awake and alert CVS: RRR Resp: few basilar crackles Abd:+ BS NTND Ext: no edema NEURO: CNI M&SI Ox3 no asterixis   . aspirin EC  81 mg Oral Daily  . lamoTRIgine  200 mg Oral Daily  . metoprolol succinate  100 mg Oral Daily  . pantoprazole  40 mg Oral Q1200  . simvastatin  20 mg Oral Daily  . sodium chloride  3 mL Intravenous Q12H  . sodium chloride  3 mL Intravenous Q12H   Dg Chest 2 View  10/10/2015  CLINICAL DATA:  CHF, chronic renal insufficiency EXAM: CHEST  2 VIEW COMPARISON:  PA and lateral chest x-ray of October 08, 2015 FINDINGS: The lungs are adequately inflated. The interstitial markings remain increased bilaterally. Areas of confluent alveolar opacity persist in the perihilar regions. There is mild tenting of the left hemidiaphragm. There is no pneumothorax. There are small bilateral pleural effusions blunting the posterior costophrenic angles. The mediastinum is normal in width. The bony thorax exhibits no acute abnormality. IMPRESSION: CHF with mild pulmonary interstitial edema and small bilateral pleural effusions. There has not been significant change since yesterday's study. Electronically Signed   By: David  Sw[place]ilandJordan M.D.   On: 10/10/2015 07:44   BMET    Component Value Date/Time   NA 142 10/11/2015 0422   K 3.7 10/11/2015 0422   CL 104 10/11/2015 0422   CO2 26 10/11/2015 0422   GLUCOSE 113* 10/11/2015 0422   BUN 42* 10/11/2015 0422   CREATININE 2.54* 10/11/2015 0422   CREATININE 1.96* 11/28/2012 1233   CALCIUM 8.7* 10/11/2015 0422   GFRNONAA 24* 10/11/2015 0422   GFRAA 28*  10/11/2015 0422   CBC    Component Value Date/Time   WBC 12.3* 10/11/2015 0422   RBC 5.34 10/11/2015 0422   RBC 3.29* 01/16/2012 2214   HGB 15.5 10/11/2015 0422   HCT 47.3 10/11/2015 0422   PLT 319 10/11/2015 0422   MCV 88.6 10/11/2015 0422   MCH 29.0 10/11/2015 0422   MCHC 32.8 10/11/2015 0422   RDW 17.8* 10/11/2015 0422   LYMPHSABS 0.9 08/23/2013 1054   MONOABS 1.0 08/23/2013 1054   EOSABS 0.0 08/23/2013 1054   BASOSABS 0.0 08/23/2013 1054     Assessment:  1. CKD 3/4 2.  A fib 3. Pulm edema 4. HTN, though BP lowish now  Plan: 1.  DC IV NS as part of routine cath orders 2. Resume lasix after cath 3. Daily Scr 4 PTH P   Jordan [name] T

## 2015-10-11 NOTE — Progress Notes (Signed)
TR band removed and dressing applied to right wrist.  After applying blood pressure cuff to right arm it was noted that dressing to right wrist showed bleeding.  Pressure applied to right wrist.  Cath lab nurse notified and came to Pt bedside.  Per check of wrist, no further bleeding noted.  TR band reapplied with 10 ml's of air.  Will restart process.  Recheck of Pt BP at this time is 86/63.  Charge nurse notified and call placed to attending.  Pt denies chest pain or distress but is unable to speak more than a few words without becoming short of breath.  Will cont to closely monitor.

## 2015-10-11 NOTE — Consult Note (Signed)
[address]       [place] [address]             [phone]      Cardiothoracic Surgery Consultation  Reason for Consult: Left main and severe multi-vessel coronary artery disease Referring Physician: Dr. Daneen Schick  Jordan [name] is an 69 y.o. male.  HPI:   The patient is a 69 year old gentleman with hypertension, stage III CKD with a baseline creat of 1.8 who presented to the [facility] on 12/18 with worsening weakness, shortness of breath, and back and shoulder pain over the past week or so. In the ER troponin was 5 and CXR showed pulmonary edema. An echo showed an EF of 60-65% with mild to moderate MR although mitral valve was not well seen. The aortic valve leaflets were mildly calcified but there was no stenosis. Cardiac cath today showed severe 3-vessel coronary artery disease with 60% distal LM stenosis. He has not had any back or shoulder pain today but was short of breath while eating.  Past Medical History  Diagnosis Date  . Hypertension   . Kidney stone   . Bipolar 1 disorder ([place])   . Diverticulitis   . Anxiety   . Arthritis   . Sleep apnea   . Chronic headaches   . Stage III chronic kidney disease     Baseline creatinine 1.8  . Left inguinal hernia   . Atherosclerosis   . CAD (coronary artery disease)   . Hiatal hernia   . 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 Antritis (stomach)     Past Surgical History  Procedure Laterality Date  . Appendectomy      69 years old  . Partial colectomy    . Spine surgery  2013  . Anterior cervical decomp/discectomy fusion N/A 12/04/2012    Procedure: ANTERIOR CERVICAL DECOMPRESSION/DISCECTOMY FUSION 2 LEVELS;  Surgeon: Charlie Pitter, MD;  Location: [facility];  Service: Neurosurgery;  Laterality: N/A;  Cervical five-six, six-seven anterior cervical decompression/discectomy fusion with allograft and plating  . Esophagogastroduodenoscopy N/A 05/06/2015    Procedure: ESOPHAGOGASTRODUODENOSCOPY (EGD);   Surgeon: Clarene Essex, MD;  Location: [facility];  Service: Endoscopy;  Laterality: N/A;    Family History  Problem Relation Age of Onset  . Anesthesia problems Neg Hx   . Heart attack Father   . Liver cancer Paternal Grandfather     unsure of other cancers   . Diabetes Sister     Social History:  reports that he quit smoking about 31 years ago. He has never used smokeless tobacco. He reports that he does not drink alcohol or use illicit drugs.  Allergies: No Known Allergies  Medications:  I have reviewed the patient's current medications. Prior to Admission:  Prescriptions prior to admission  Medication Sig Dispense Refill Last Dose  . Aspirin-Salicylamide-Caffeine (BC HEADACHE POWDER PO) Take 1 packet by mouth every 4 (four) hours as needed (for pain).   10/08/2015 at Unknown time  . bisoprolol (ZEBETA) 5 MG tablet Take 5 mg by mouth daily.   Past Week at Unknown time  . metoprolol succinate (TOPROL-XL) 50 MG 24 hr tablet TAKE 1 TABLET BY MOUTH EVERY DAY **NEEDS OV** 15 tablet 0 10/07/2015 at unsure  . simvastatin (ZOCOR) 20 MG tablet Take 20 mg by mouth daily.   Past Week at Unknown time  . lansoprazole (PREVACID) 15 MG capsule Take 1 capsule (15 mg total) by mouth daily at 12  noon. (Patient not taking: Reported on 10/08/2015) 30 capsule 0 Taking   Scheduled: . aspirin EC  81 mg Oral Daily  . furosemide  80 mg Intravenous BID  . lamoTRIgine  200 mg Oral Daily  . metoprolol succinate  100 mg Oral Daily  . pantoprazole  40 mg Oral Q1200  . simvastatin  20 mg Oral Daily  . sodium chloride  3 mL Intravenous Q12H   Continuous: . sodium chloride    . sodium chloride 1 mL/kg/hr (10/11/15 1400)  . amiodarone 30 mg/hr (10/11/15 1647)  . heparin     VQM:GQQPYP chloride, acetaminophen, ondansetron (ZOFRAN) IV, sodium chloride, traMADol Anti-infectives    None      Results for orders placed or performed during the hospital encounter of 10/08/15 (from the past 48 hour(s))    Heparin level (unfractionated)     Status: Abnormal   Collection Time: 10/09/15  6:16 PM  Result Value Ref Range   Heparin Unfractionated 0.19 (L) 0.30 - 0.70 IU/mL    Comment:        IF HEPARIN RESULTS ARE BELOW EXPECTED VALUES, AND PATIENT DOSAGE HAS BEEN CONFIRMED, SUGGEST FOLLOW UP TESTING OF ANTITHROMBIN III LEVELS.   Troponin I     Status: Abnormal   Collection Time: 10/09/15  8:54 PM  Result Value Ref Range   Troponin I 7.01 (HH) <0.031 ng/mL    Comment:        POSSIBLE MYOCARDIAL ISCHEMIA. SERIAL TESTING RECOMMENDED. CRITICAL VALUE NOTED.  VALUE IS CONSISTENT WITH PREVIOUSLY REPORTED AND CALLED VALUE.   Basic metabolic panel     Status: Abnormal   Collection Time: 10/10/15  1:13 AM  Result Value Ref Range   Sodium 140 135 - 145 mmol/L   Potassium 3.3 (L) 3.5 - 5.1 mmol/L    Comment: DELTA CHECK NOTED NO VISIBLE HEMOLYSIS    Chloride 106 101 - 111 mmol/L   CO2 21 (L) 22 - 32 mmol/L   Glucose, Bld 129 (H) 65 - 99 mg/dL   BUN 45 (H) 6 - 20 mg/dL   Creatinine, Ser 2.53 (H) 0.61 - 1.24 mg/dL   Calcium 8.4 (L) 8.9 - 10.3 mg/dL   GFR calc non Af Amer 24 (L) >60 mL/min   GFR calc Af Amer 28 (L) >60 mL/min    Comment: (NOTE) The eGFR has been calculated using the CKD EPI equation. This calculation has not been validated in all clinical situations. eGFR's persistently <60 mL/min signify possible Chronic Kidney Disease.    Anion gap 13 5 - 15  Magnesium     Status: None   Collection Time: 10/10/15  1:13 AM  Result Value Ref Range   Magnesium 2.0 1.7 - 2.4 mg/dL  Basic metabolic panel     Status: Abnormal   Collection Time: 10/10/15  3:32 AM  Result Value Ref Range   Sodium 142 135 - 145 mmol/L   Potassium 3.8 3.5 - 5.1 mmol/L   Chloride 107 101 - 111 mmol/L   CO2 25 22 - 32 mmol/L   Glucose, Bld 124 (H) 65 - 99 mg/dL   BUN 43 (H) 6 - 20 mg/dL   Creatinine, Ser 2.50 (H) 0.61 - 1.24 mg/dL   Calcium 8.6 (L) 8.9 - 10.3 mg/dL   GFR calc non Af Amer 24 (L) >60  mL/min   GFR calc Af Amer 28 (L) >60 mL/min    Comment: (NOTE) The eGFR has been calculated using the CKD EPI equation. This calculation has not  been validated in all clinical situations. eGFR's persistently <60 mL/min signify possible Chronic Kidney Disease.    Anion gap 10 5 - 15  CBC     Status: Abnormal   Collection Time: 10/10/15  3:32 AM  Result Value Ref Range   WBC 10.5 4.0 - 10.5 K/uL   RBC 5.15 4.22 - 5.81 MIL/uL   Hemoglobin 14.7 13.0 - 17.0 g/dL   HCT 44.7 39.0 - 52.0 %   MCV 86.8 78.0 - 100.0 fL   MCH 28.5 26.0 - 34.0 pg   MCHC 32.9 30.0 - 36.0 g/dL   RDW 17.5 (H) 11.5 - 15.5 %   Platelets 290 150 - 400 K/uL  Heparin level (unfractionated)     Status: None   Collection Time: 10/10/15  3:32 AM  Result Value Ref Range   Heparin Unfractionated 0.40 0.30 - 0.70 IU/mL    Comment:        IF HEPARIN RESULTS ARE BELOW EXPECTED VALUES, AND PATIENT DOSAGE HAS BEEN CONFIRMED, SUGGEST FOLLOW UP TESTING OF ANTITHROMBIN III LEVELS.   Heparin level (unfractionated)     Status: None   Collection Time: 10/10/15 11:05 AM  Result Value Ref Range   Heparin Unfractionated 0.41 0.30 - 0.70 IU/mL    Comment:        IF HEPARIN RESULTS ARE BELOW EXPECTED VALUES, AND PATIENT DOSAGE HAS BEEN CONFIRMED, SUGGEST FOLLOW UP TESTING OF ANTITHROMBIN III LEVELS.   Urinalysis, Routine w reflex microscopic (not at [facility])     Status: None   Collection Time: 10/10/15  3:35 PM  Result Value Ref Range   Color, Urine YELLOW YELLOW   APPearance CLEAR CLEAR   Specific Gravity, Urine 1.009 1.005 - 1.030   pH 5.5 5.0 - 8.0   Glucose, UA NEGATIVE NEGATIVE mg/dL   Hgb urine dipstick NEGATIVE NEGATIVE   Bilirubin Urine NEGATIVE NEGATIVE   Ketones, ur NEGATIVE NEGATIVE mg/dL   Protein, ur NEGATIVE NEGATIVE mg/dL   Nitrite NEGATIVE NEGATIVE   Leukocytes, UA NEGATIVE NEGATIVE    Comment: MICROSCOPIC NOT DONE ON URINES WITH NEGATIVE PROTEIN, BLOOD, LEUKOCYTES, NITRITE, OR GLUCOSE <1000 mg/dL.    Basic metabolic panel     Status: Abnormal   Collection Time: 10/11/15  4:22 AM  Result Value Ref Range   Sodium 142 135 - 145 mmol/L   Potassium 3.7 3.5 - 5.1 mmol/L   Chloride 104 101 - 111 mmol/L   CO2 26 22 - 32 mmol/L   Glucose, Bld 113 (H) 65 - 99 mg/dL   BUN 42 (H) 6 - 20 mg/dL   Creatinine, Ser 2.54 (H) 0.61 - 1.24 mg/dL   Calcium 8.7 (L) 8.9 - 10.3 mg/dL   GFR calc non Af Amer 24 (L) >60 mL/min   GFR calc Af Amer 28 (L) >60 mL/min    Comment: (NOTE) The eGFR has been calculated using the CKD EPI equation. This calculation has not been validated in all clinical situations. eGFR's persistently <60 mL/min signify possible Chronic Kidney Disease.    Anion gap 12 5 - 15  CBC     Status: Abnormal   Collection Time: 10/11/15  4:22 AM  Result Value Ref Range   WBC 12.3 (H) 4.0 - 10.5 K/uL   RBC 5.34 4.22 - 5.81 MIL/uL   Hemoglobin 15.5 13.0 - 17.0 g/dL   HCT 47.3 39.0 - 52.0 %   MCV 88.6 78.0 - 100.0 fL   MCH 29.0 26.0 - 34.0 pg   MCHC 32.8 30.0 -  36.0 g/dL   RDW 17.8 (H) 11.5 - 15.5 %   Platelets 319 150 - 400 K/uL  Heparin level (unfractionated)     Status: None   Collection Time: 10/11/15  4:22 AM  Result Value Ref Range   Heparin Unfractionated 0.56 0.30 - 0.70 IU/mL    Comment:        IF HEPARIN RESULTS ARE BELOW EXPECTED VALUES, AND PATIENT DOSAGE HAS BEEN CONFIRMED, SUGGEST FOLLOW UP TESTING OF ANTITHROMBIN III LEVELS.   Phosphorus     Status: None   Collection Time: 10/11/15  4:22 AM  Result Value Ref Range   Phosphorus 3.3 2.5 - 4.6 mg/dL  Surgical pcr screen     Status: None   Collection Time: 10/11/15  5:13 AM  Result Value Ref Range   MRSA, PCR NEGATIVE NEGATIVE   Staphylococcus aureus NEGATIVE NEGATIVE    Comment:        The Xpert SA Assay (FDA approved for NASAL specimens in patients over 71 years of age), is one component of a comprehensive surveillance program.  Test performance has been validated by [facility] for patients greater than  or equal to 105 year old. It is not intended to diagnose infection nor to guide or monitor treatment.     Dg Chest 2 View  10/10/2015  CLINICAL DATA:  CHF, chronic renal insufficiency EXAM: CHEST  2 VIEW COMPARISON:  PA and lateral chest x-ray of October 08, 2015 FINDINGS: The lungs are adequately inflated. The interstitial markings remain increased bilaterally. Areas of confluent alveolar opacity persist in the perihilar regions. There is mild tenting of the left hemidiaphragm. There is no pneumothorax. There are small bilateral pleural effusions blunting the posterior costophrenic angles. The mediastinum is normal in width. The bony thorax exhibits no acute abnormality. IMPRESSION: CHF with mild pulmonary interstitial edema and small bilateral pleural effusions. There has not been significant change since yesterday's study. Electronically Signed   By: David  Martinique M.D.   On: 10/10/2015 07:44    Review of Systems  Constitutional: Positive for malaise/fatigue. Negative for fever and chills.  Eyes: Negative.   Respiratory: Positive for shortness of breath. Negative for cough, hemoptysis and sputum production.   Cardiovascular: Positive for orthopnea. Negative for chest pain, palpitations and leg swelling.  Gastrointestinal: Negative for nausea, vomiting, abdominal pain, diarrhea, constipation, blood in stool and melena.       Hx of GI bleeding due to diverticulosis in the past requiring blood transfusion.  Genitourinary: Positive for urgency and frequency.  Musculoskeletal: Positive for neck pain.  Skin: Negative.   Neurological: Negative.   Endo/Heme/Allergies: Negative.   Psychiatric/Behavioral: The patient is nervous/anxious.    Blood pressure 101/70, pulse 86, temperature 97.5 F (36.4 C), temperature source Oral, resp. rate 18, height '5\' 6"'  (1.676 m), weight 96.6 kg (212 lb 15.4 oz), SpO2 100 %. Physical Exam  Constitutional: He is oriented to person, place, and time. He appears  well-developed and well-nourished. No distress.  Looks pale  HENT:  Head: Normocephalic and atraumatic.  Mouth/Throat: Oropharynx is clear and moist.  Eyes: EOM are normal. Pupils are equal, round, and reactive to light.  Neck: Normal range of motion. Neck supple. No JVD present.  Cardiovascular: Normal rate, regular rhythm, normal heart sounds and intact distal pulses.   No murmur heard. Respiratory: Effort normal and breath sounds normal. No respiratory distress.  GI: Soft. Bowel sounds are normal. He exhibits no distension and no mass. There is no tenderness.  Musculoskeletal: Normal  range of motion. He exhibits no edema.  Lymphadenopathy:    He has no cervical adenopathy.  Neurological: He is alert and oriented to person, place, and time. He has normal strength. No cranial nerve deficit.  Skin: Skin is warm and dry.  Psychiatric: He has a normal mood and affect.            *[facility]*            [address] [place], [place] [address]              [phone]  ------------------------------------------------------------------- Transthoracic Echocardiography  Patient:  Sloan, Takagi MR #:    [id number] Study Date: 10/09/2015 Gender:   M Age:    17 Height:   167.6 cm Weight:   99.8 kg BSA:    2.2 m^2 Pt. Status: Room:    2W08C  ATTENDING  Peter Martinique, M.D. PERFORMING  [facility], Inpatient ADMITTING  Berdie Ogren SONOGRAPHER Mikki Santee  cc:  ------------------------------------------------------------------- LV EF: 60% -  65%  ------------------------------------------------------------------- Indications:   Dyspnea 786.09.  ------------------------------------------------------------------- History:  PMH:  Coronary artery disease. Risk  factors: Hypertension.  ------------------------------------------------------------------- Study Conclusions  - Left ventricle: The cavity size was normal. Wall thickness was normal. Systolic function was normal. The estimated ejection fraction was in the range of 60% to 65%. - Mitral valve: MV not well seen. Appeas to be eccentric MR directed posteriorly. Cannot see prolaspe. Overall mild to moderate but consider TEE if clinically indicated to further evaluate given poor image qualtity - Left atrium: The atrium was mildly dilated. - Atrial septum: No defect or patent foramen ovale was identified.  Transthoracic echocardiography. M-mode, complete 2D, spectral Doppler, and color Doppler. Birthdate: Patient birthdate: 01/26/1946. Age: Patient is 69 yr old. Sex: Gender: male. BMI: 35.5 kg/m^2. Blood pressure:   128/96 Patient status: Inpatient. Study date: Study date: 10/09/2015. Study time: 11:29 AM. Location: Bedside.  -------------------------------------------------------------------  ------------------------------------------------------------------- Left ventricle: The cavity size was normal. Wall thickness was normal. Systolic function was normal. The estimated ejection fraction was in the range of 60% to 65%.  ------------------------------------------------------------------- Aortic valve:  Mildly calcified leaflets. Doppler:  There was no stenosis.  ------------------------------------------------------------------- Aorta: The aorta was normal, not dilated, and non-diseased.  ------------------------------------------------------------------- Mitral valve: MV not well seen. Appeas to be eccentric MR directed posteriorly. Cannot see prolaspe. Overall mild to moderate but consider TEE if clinically indicated to further evaluate given poor image qualtity  ------------------------------------------------------------------- Left atrium:  The atrium was mildly dilated.  ------------------------------------------------------------------- Atrial septum: No defect or patent foramen ovale was identified.  ------------------------------------------------------------------- Right ventricle: The cavity size was normal. Wall thickness was normal. Systolic function was normal.  ------------------------------------------------------------------- Pulmonic valve:  Doppler: There was trivial regurgitation.  ------------------------------------------------------------------- Tricuspid valve:  Doppler: There was mild regurgitation.  ------------------------------------------------------------------- Right atrium: The atrium was normal in size.  ------------------------------------------------------------------- Pericardium: The pericardium was normal in appearance.  ------------------------------------------------------------------- Post procedure conclusions Ascending Aorta:  - The aorta was normal, not dilated, and non-diseased.  ------------------------------------------------------------------- Measurements  Left ventricle             Value    Reference LV ID, ED, PLAX chordal    (H)   54.7 mm   43 - 52 LV ID, ES, PLAX chordal        32.4  mm   23 - 38 LV fx shortening, PLAX chordal     41  %   >=29 LV PW thickness, ED          9.66 mm   --------- IVS/LV PW ratio, ED          1.09     <=1.3 LV e&', lateral             8.7  cm/s  --------- LV e&', medial             9.03 cm/s  --------- LV e&', average             8.87 cm/s  ---------  Ventricular septum           Value    Reference IVS thickness, ED           10.5 mm   ---------  LVOT                  Value    Reference LVOT ID, S               23  mm    --------- LVOT area               4.15 cm^2  ---------  Aorta                 Value    Reference Aortic root ID, ED           30  mm   ---------  Left atrium              Value    Reference LA ID, A-P, ES             41  mm   --------- LA ID/bsa, A-P             1.87 cm/m^2 <=2.2 LA volume, S              38.6 ml   --------- LA volume/bsa, S            17.6 ml/m^2 --------- LA volume, ES, 1-p A4C         38  ml   --------- LA volume/bsa, ES, 1-p A4C       17.3 ml/m^2 --------- LA volume, ES, 1-p A2C         37.6 ml   --------- LA volume/bsa, ES, 1-p A2C       17.1 ml/m^2 ---------  Tricuspid valve            Value    Reference Tricuspid regurg peak velocity     299  cm/s  --------- Tricuspid peak RV-RA gradient     36  mm Hg ---------  Right ventricle            Value    Reference RV s&', lateral, S           13.3 cm/s  ---------  Legend: (L) and (H) mark values outside specified reference range.  ------------------------------------------------------------------- Prepared and Electronically Authenticated by  Jenkins Rouge, M.D. 2016-12-19T14:16:41  Physicians    Panel Physicians Referring Physician Case Authorizing Physician   Belva Crome, MD (Primary)      Procedures    Right/Left Heart Cath and Coronary Angiography    Conclusion    1. Ost RCA to Prox RCA lesion, 50% stenosed. 2. Prox RCA to Dist RCA lesion, 99% stenosed. 3. RPDA lesion, 99% stenosed. 4. Mid Cx to  Dist Cx lesion, 100% stenosed. 5. 1st Mrg lesion, 100% stenosed. 6. LM lesion, 60% stenosed. 7. Prox LAD to Mid LAD lesion, 85% stenosed. 8. Ost 1st Diag lesion, 75% stenosed. 9. Mid LAD to Dist LAD lesion, 70% stenosed.   Severe three-vessel coronary  disease including left main. There is total occlusion of the first obtuse marginal, the mid circumflex, and functional occlusion of the large PDA. There is at least 50% obstruction in the mid to distal left main, high-grade diffuse disease up to 80% of the proximal to mid LAD, and segmental mid to distal RCA 95-99% stenosis.  Left ventriculography was not performed. Left ventricular end-diastolic pressure was upper normal with a measured wedge pressure of 11 mmHg high on the LVEDP was measured at 20 mmHg. Normal LVEF by echo this admission.  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 Total of 40 mL contrast used for the procedure.   RECOMMENDATIONS:   Consider coronary bypass grafting (consult called to his TCTS)  Monitor for acute kidney injury  Gentle IV hydration overnight     Indications    Acute systolic heart failure (HCC) [I50.21 (ICD-10-CM)]   NSTEMI (non-ST elevated myocardial infarction) ([place]) [I21.4 (ICD-10-CM)]   Type 2 diabetes mellitus without complication, without long-term current use of insulin (HCC) [E11.9 (ICD-10-CM)]    Technique and Indications    The right radial area was sterilely prepped and draped. Intravenous sedation with Versed and fentanyl was administered. 1% Xylocaine was infiltrated to achieve local analgesia. A double wall stick with an angiocath was utilized to obtain intra-arterial access. The modified Seldinger technique was used to place a 31F " Slender" sheath in the right radial artery. Coronary angiography was done using 5 F catheters. Right coronary angiography was performed with a JR4. Left ventriculography was done using the JR 4 catheter and hand injection. Left coronary angiography was performed with a JL 3.5 cm.  Right heart catheterization was performed via the left antecubital vein. A previously placed Angiocath was exchanged using double glove technique and a 5 French brachial sheath inserted after 1% Xylocaine local infiltration. A 5 French  balloon tipped Gordy Councilman was then used for right heart pressure recording and oximetry sampling. Intermittent fluoroscopic guidance was used. No complications occurred. Estimated blood loss <50 mL. There were no immediate complications during the procedure.    Coronary Findings    Dominance: Co-dominant   Left Main   . LM lesion, 60% stenosed. The lesion is type C Calcified eccentric.     Left Anterior Descending   . Prox LAD to Mid LAD lesion, 85% stenosed. The lesion is type C Calcified eccentric.   . Mid LAD to Dist LAD lesion, 70% stenosed. Diffuse eccentric.   . First Diagonal Branch   The vessel is small in size.   Colon Flattery 1st Diag lesion, 75% stenosed.   . Second Diagonal Branch   The vessel is small in size.   . Third Diagonal Branch   The vessel is small in size.     Left Circumflex   . Mid Cx to Dist Cx lesion, 100% stenosed.   . First Obtuse Marginal Branch   1st Mrg filled by collaterals from 1st RPLB.   . 1st Mrg lesion, 100% stenosed.     Right Coronary Artery   . Ost RCA to Prox RCA lesion, 50% stenosed.   . Prox RCA to Dist RCA lesion, 99% stenosed. The lesion is type C Calcified diffuse with heavy thrombus tubular eccentric.   Marland Kitchen  Right Posterior Descending Artery   RPDA filled by collaterals from Dist LAD.   Marland Kitchen RPDA lesion, 99% stenosed. The lesion is type C.       Right Heart Pressures Elevated LV EDP consistent with volume overload.    Left Heart    Left Ventricle Evaluated by echocardiography    Coronary Diagrams    Diagnostic Diagram            Implants    Name ID Temporary Type Supply   No information to display    PACS Images    Show images for Cardiac catheterization     Link to Procedure Log    Procedure Log      Hemo Data       Most Recent Value   Fick Cardiac Output  3.5 L/min   Fick Cardiac Output Index  1.71 (L/min)/BSA   RA A Wave  9 mmHg   RA V Wave  6 mmHg   RA Mean  5 mmHg   RV Systolic  Pressure  31 mmHg   RV Diastolic Pressure  2 mmHg   RV EDP  7 mmHg   PA Systolic Pressure  34 mmHg   PA Diastolic Pressure  21 mmHg   PA Mean  28 mmHg   PW A Wave  13 mmHg   PW V Wave  13 mmHg   PW Mean  11 mmHg   AO Systolic Pressure  80 mmHg   AO Diastolic Pressure  62 mmHg   AO Mean  71 mmHg   LV Systolic Pressure  84 mmHg   LV Diastolic Pressure  7 mmHg   LV EDP  20 mmHg   Arterial Occlusion Pressure Extended Systolic Pressure  77 mmHg   Arterial Occlusion Pressure Extended Diastolic Pressure  56 mmHg   Arterial Occlusion Pressure Extended Mean Pressure  67 mmHg   Left Ventricular Apex Extended Systolic Pressure  77 mmHg   Left Ventricular Apex Extended Diastolic Pressure  5 mmHg   Left Ventricular Apex Extended EDP Pressure  19 mmHg   QP/QS  1   TPVR Index  16.42 HRUI   TSVR Index  41.63 HRUI   PVR SVR Ratio  0.26   TPVR/TSVR Ratio  0.39      Assessment/Plan:  I have personally reviewed his cath and echo. He has left main and severe 3-vessel coronary disease presenting with NSTEMI and pulmonary edema. He has some mitral regurgitation on echo that I suspect is mild since I do not hear a murmur but this will have to be evaluated in the OR with TEE. I agree that his coronary disease is most amenable to CABG. He also has stage III CKD and creatinine was up to 2.1 on 09/18/2015 and 2.2 on admission. It was 2.54 this am before cath. Ideally I would like to be sure that his creat is stable before proceeding with CABG to minimize the risk of worsening renal failure. I will check his creatinine tomorrow and if stable may proceed with CABG on Friday. I discussed the operative procedure with the patient and family including alternatives, benefits and risks; including but not limited to bleeding, blood transfusion, infection, stroke, myocardial infarction, graft failure, heart block requiring a permanent pacemaker, organ dysfunction, and death.  Jordan [name]  understands and agrees to proceed.    Jordan [name] 10/11/2015, 5:29 PM

## 2015-10-11 NOTE — Care Management Important Message (Signed)
Important Message  Patient Details  Name: Jordan [name] MRN: [id number] Date of Birth: 1946-09-07   Medicare Important Message Given:  Yes    [name], [name] 10/11/2015, 5:09 PM

## 2015-10-11 NOTE — Care Management Note (Signed)
Case Management Note  Patient Details  Name: Marcelo BaldyLarry W Canizalez MRN: [id number] Date of Birth: Jun 01, 1946  Subjective/Objective:    Pt admitted with acute diastolic failure              Action/Plan:  Pt is independent from home, male companion at bedside.  Pt remains on IV Heparin, with IV lasix.  CM performed HF screen:  Pt stated he would purchase a scale and was educated on importance of daily weights, pt educated on need to adhere to low sodium diet.  Pt stated he may need CABG.  CM consulted HF nurse for consult   Expected Discharge Date:  10/11/15               Expected Discharge Plan:  Home/Self Care (Pt is from home alone independent)  In-House Referral:     Discharge planning Services  CM Consult  Post Acute Care Choice:    Choice offered to:     DME Arranged:    DME Agency:     HH Arranged:    HH Agency:     Status of Service:  In process, will continue to follow  Medicare Important Message Given:    Date Medicare IM Given:    Medicare IM give by:    Date Additional Medicare IM Given:    Additional Medicare Important Message give by:     If discussed at Long Length of Stay Meetings, dates discussed:    Additional Comments:  Cherylann ParrClaxton, Aldea Avis S, RN 10/11/2015, 3:04 PM

## 2015-10-11 NOTE — Interval H&P Note (Signed)
Cath Lab Visit (complete for each Cath Lab visit)  Clinical Evaluation Leading to the Procedure:   ACS: Yes.    Non-ACS:    Anginal Classification: CCS III  Anti-ischemic medical therapy: Maximal Therapy (2 or more classes of medications)  Non-Invasive Test Results: No non-invasive testing performed  Prior CABG: No previous CABG      History and Physical Interval Note:  10/11/2015 12:34 PM  Jordan [name]  has presented today for surgery, with the diagnosis of cp  The various methods of treatment have been discussed with the patient and family. After consideration of risks, benefits and other options for treatment, the patient has consented to  Procedure(s): Right/Left Heart Cath and Coronary Angiography (N/A) as a surgical intervention .  The patient's history has been reviewed, patient examined, no change in status, stable for surgery.  I have reviewed the patient's chart and labs.  Questions were answered to the patient's satisfaction.     Lesleigh NoeSMITH III,Alani Sabbagh W

## 2015-10-11 NOTE — Progress Notes (Signed)
Return call received from Dr. SwazilandJordan.  Hold 1800 lasix and continue IV fluids as ordered per Dr. SwazilandJordan.  Will cont to monitor.  Pt sitting up in bed and speaking with family.  No s/s of distress at this time.

## 2015-10-12 ENCOUNTER — Encounter (HOSPITAL_COMMUNITY): Payer: Self-pay | Admitting: Interventional Cardiology

## 2015-10-12 ENCOUNTER — Inpatient Hospital Stay (HOSPITAL_COMMUNITY): Payer: PPO

## 2015-10-12 DIAGNOSIS — E785 Hyperlipidemia, unspecified: Secondary | ICD-10-CM

## 2015-10-12 DIAGNOSIS — I2510 Atherosclerotic heart disease of native coronary artery without angina pectoris: Secondary | ICD-10-CM

## 2015-10-12 LAB — PULMONARY FUNCTION TEST
DL/VA % pred: 97 %
DL/VA: 4.05 ml/min/mmHg/L
DLCO COR % PRED: 72 %
DLCO COR: 17.63 ml/min/mmHg
DLCO UNC % PRED: 73 %
DLCO UNC: 17.78 ml/min/mmHg
FEF 25-75 POST: 2.41 L/s
FEF 25-75 PRE: 1.47 L/s
FEF2575-%Change-Post: 63 %
FEF2575-%PRED-POST: 128 %
FEF2575-%PRED-PRE: 78 %
FEV1-%Change-Post: 12 %
FEV1-%PRED-POST: 95 %
FEV1-%Pred-Pre: 85 %
FEV1-PRE: 2.11 L
FEV1-Post: 2.37 L
FEV1FVC-%Change-Post: 9 %
FEV1FVC-%PRED-PRE: 100 %
FEV6-%Change-Post: 4 %
FEV6-%PRED-PRE: 88 %
FEV6-%Pred-Post: 92 %
FEV6-POST: 2.94 L
FEV6-PRE: 2.82 L
FEV6FVC-%CHANGE-POST: 0 %
FEV6FVC-%Pred-Post: 107 %
FEV6FVC-%Pred-Pre: 106 %
FVC-%Change-Post: 2 %
FVC-%Pred-Post: 86 %
FVC-%Pred-Pre: 84 %
FVC-PRE: 2.88 L
FVC-Post: 2.95 L
POST FEV1/FVC RATIO: 80 %
PRE FEV1/FVC RATIO: 73 %
Post FEV6/FVC ratio: 100 %
Pre FEV6/FVC Ratio: 99 %
RV % PRED: 71 %
RV: 1.54 L
TLC % pred: 80 %
TLC: 4.68 L

## 2015-10-12 LAB — CBC
HCT: 46.2 % (ref 39.0–52.0)
HEMOGLOBIN: 14.9 g/dL (ref 13.0–17.0)
MCH: 28.6 pg (ref 26.0–34.0)
MCHC: 32.3 g/dL (ref 30.0–36.0)
MCV: 88.7 fL (ref 78.0–100.0)
PLATELETS: 294 10*3/uL (ref 150–400)
RBC: 5.21 MIL/uL (ref 4.22–5.81)
RDW: 17.5 % — ABNORMAL HIGH (ref 11.5–15.5)
WBC: 11.6 10*3/uL — ABNORMAL HIGH (ref 4.0–10.5)

## 2015-10-12 LAB — BASIC METABOLIC PANEL
Anion gap: 14 (ref 5–15)
BUN: 36 mg/dL — ABNORMAL HIGH (ref 6–20)
CALCIUM: 8.8 mg/dL — AB (ref 8.9–10.3)
CO2: 23 mmol/L (ref 22–32)
CREATININE: 2.36 mg/dL — AB (ref 0.61–1.24)
Chloride: 101 mmol/L (ref 101–111)
GFR calc Af Amer: 30 mL/min — ABNORMAL LOW (ref >60)
GFR calc non Af Amer: 26 mL/min — ABNORMAL LOW (ref >60)
GLUCOSE: 109 mg/dL — AB (ref 65–99)
Potassium: 3.6 mmol/L (ref 3.5–5.1)
SODIUM: 138 mmol/L (ref 135–145)

## 2015-10-12 LAB — TYPE AND SCREEN
ABO/RH(D): B POS
Antibody Screen: NEGATIVE

## 2015-10-12 LAB — GLUCOSE, CAPILLARY: Glucose-Capillary: 111 mg/dL — ABNORMAL HIGH (ref 65–99)

## 2015-10-12 LAB — HEPARIN LEVEL (UNFRACTIONATED)
HEPARIN UNFRACTIONATED: 0.27 [IU]/mL — AB (ref 0.30–0.70)
Heparin Unfractionated: 0.45 IU/mL (ref 0.30–0.70)

## 2015-10-12 LAB — PARATHYROID HORMONE, INTACT (NO CA): PTH: 247 pg/mL — ABNORMAL HIGH (ref 15–65)

## 2015-10-12 MED ORDER — NITROGLYCERIN IN D5W 200-5 MCG/ML-% IV SOLN
2.0000 ug/min | INTRAVENOUS | Status: AC
  Administered 2015-10-13: 5 ug/min via INTRAVENOUS
  Filled 2015-10-12: qty 250

## 2015-10-12 MED ORDER — METOPROLOL TARTRATE 12.5 MG HALF TABLET
12.5000 mg | ORAL_TABLET | Freq: Once | ORAL | Status: AC
  Administered 2015-10-13: 12.5 mg via ORAL
  Filled 2015-10-12: qty 1

## 2015-10-12 MED ORDER — CHLORHEXIDINE GLUCONATE 0.12 % MT SOLN
15.0000 mL | Freq: Once | OROMUCOSAL | Status: AC
  Administered 2015-10-13: 15 mL via OROMUCOSAL
  Filled 2015-10-12: qty 15

## 2015-10-12 MED ORDER — DEXTROSE 5 % IV SOLN
30.0000 ug/min | INTRAVENOUS | Status: AC
  Administered 2015-10-13: 50 ug/min via INTRAVENOUS
  Administered 2015-10-13 (×2): via INTRAVENOUS
  Filled 2015-10-12: qty 2

## 2015-10-12 MED ORDER — INSULIN REGULAR HUMAN 100 UNIT/ML IJ SOLN
INTRAMUSCULAR | Status: AC
  Administered 2015-10-13: 1 [IU]/h via INTRAVENOUS
  Filled 2015-10-12: qty 2.5

## 2015-10-12 MED ORDER — DOPAMINE-DEXTROSE 3.2-5 MG/ML-% IV SOLN
0.0000 ug/kg/min | INTRAVENOUS | Status: AC
  Administered 2015-10-13: 3 ug/kg/min via INTRAVENOUS
  Filled 2015-10-12: qty 250

## 2015-10-12 MED ORDER — DEXTROSE 5 % IV SOLN
750.0000 mg | INTRAVENOUS | Status: DC
  Filled 2015-10-12 (×2): qty 750

## 2015-10-12 MED ORDER — BISACODYL 5 MG PO TBEC
5.0000 mg | DELAYED_RELEASE_TABLET | Freq: Once | ORAL | Status: AC
  Administered 2015-10-12: 5 mg via ORAL
  Filled 2015-10-12: qty 1

## 2015-10-12 MED ORDER — DEXMEDETOMIDINE HCL IN NACL 400 MCG/100ML IV SOLN
0.1000 ug/kg/h | INTRAVENOUS | Status: AC
  Administered 2015-10-13: .2 ug/kg/h via INTRAVENOUS
  Filled 2015-10-12: qty 100

## 2015-10-12 MED ORDER — HYDROCODONE-ACETAMINOPHEN 5-325 MG PO TABS
1.0000 | ORAL_TABLET | Freq: Four times a day (QID) | ORAL | Status: DC | PRN
  Administered 2015-10-12: 1 via ORAL
  Filled 2015-10-12: qty 1

## 2015-10-12 MED ORDER — PLASMA-LYTE 148 IV SOLN
INTRAVENOUS | Status: AC
  Administered 2015-10-13: 500 mL
  Filled 2015-10-12: qty 2.5

## 2015-10-12 MED ORDER — TEMAZEPAM 15 MG PO CAPS
15.0000 mg | ORAL_CAPSULE | Freq: Once | ORAL | Status: AC | PRN
  Administered 2015-10-12: 15 mg via ORAL
  Filled 2015-10-12: qty 1

## 2015-10-12 MED ORDER — CHLORHEXIDINE GLUCONATE CLOTH 2 % EX PADS
6.0000 | MEDICATED_PAD | Freq: Once | CUTANEOUS | Status: AC
  Administered 2015-10-13: 6 via TOPICAL

## 2015-10-12 MED ORDER — DIAZEPAM 5 MG PO TABS
5.0000 mg | ORAL_TABLET | Freq: Once | ORAL | Status: AC
  Administered 2015-10-13: 5 mg via ORAL
  Filled 2015-10-12: qty 1

## 2015-10-12 MED ORDER — CHLORHEXIDINE GLUCONATE CLOTH 2 % EX PADS
6.0000 | MEDICATED_PAD | Freq: Once | CUTANEOUS | Status: AC
  Administered 2015-10-12: 6 via TOPICAL

## 2015-10-12 MED ORDER — ALBUTEROL SULFATE (2.5 MG/3ML) 0.083% IN NEBU
2.5000 mg | INHALATION_SOLUTION | Freq: Once | RESPIRATORY_TRACT | Status: AC
  Administered 2015-10-12: 2.5 mg via RESPIRATORY_TRACT

## 2015-10-12 MED ORDER — DEXTROSE 5 % IV SOLN
0.0000 ug/min | INTRAVENOUS | Status: DC
  Filled 2015-10-12: qty 4

## 2015-10-12 MED ORDER — MAGNESIUM SULFATE 50 % IJ SOLN
40.0000 meq | INTRAMUSCULAR | Status: DC
Start: 2015-10-13 — End: 2015-10-13
  Filled 2015-10-12: qty 10

## 2015-10-12 MED ORDER — POTASSIUM CHLORIDE 2 MEQ/ML IV SOLN
80.0000 meq | INTRAVENOUS | Status: DC
  Filled 2015-10-12: qty 40

## 2015-10-12 MED ORDER — SODIUM CHLORIDE 0.9 % IV SOLN
INTRAVENOUS | Status: DC
  Filled 2015-10-12: qty 30

## 2015-10-12 MED ORDER — DEXTROSE 5 % IV SOLN
1.5000 g | INTRAVENOUS | Status: AC
  Administered 2015-10-13: 1.5 g via INTRAVENOUS
  Administered 2015-10-13: 750 g via INTRAVENOUS
  Filled 2015-10-12: qty 1.5

## 2015-10-12 MED ORDER — SODIUM CHLORIDE 0.9 % IV SOLN
INTRAVENOUS | Status: AC
  Administered 2015-10-13: 69.8 mL/h via INTRAVENOUS
  Filled 2015-10-12: qty 40

## 2015-10-12 MED ORDER — CYCLOBENZAPRINE HCL 10 MG PO TABS
5.0000 mg | ORAL_TABLET | Freq: Three times a day (TID) | ORAL | Status: DC | PRN
  Administered 2015-10-12: 5 mg via ORAL
  Filled 2015-10-12: qty 1

## 2015-10-12 MED ORDER — VANCOMYCIN HCL 10 G IV SOLR
1500.0000 mg | INTRAVENOUS | Status: AC
  Administered 2015-10-13: 1500 mg via INTRAVENOUS
  Filled 2015-10-12: qty 1500

## 2015-10-12 MED ORDER — OXYCODONE-ACETAMINOPHEN 5-325 MG PO TABS
1.0000 | ORAL_TABLET | Freq: Three times a day (TID) | ORAL | Status: DC | PRN
  Administered 2015-10-12: 2 via ORAL
  Filled 2015-10-12: qty 2

## 2015-10-12 NOTE — Progress Notes (Signed)
Pt. Reported new onset of right upper leg numbness and intermittent burning. Per pt. "I just don't feel right. I'm nervous and unable to sleep."  Jordan [name]

## 2015-10-12 NOTE — Progress Notes (Signed)
CARDIAC REHAB PHASE I   Went to begin pre-op education with pt, RN in room, states pt is not appropriate for education at this time due to current clinical status. Left IS, cardiac surgery booklet at bedside, will follow-up this afternoon as schedule permits.   Joylene GrapesMonge, Natia Fahmy C, RN, BSN 10/12/2015 10:57 AM

## 2015-10-12 NOTE — Progress Notes (Signed)
[id number] Discussed importance of mobility and IS after surgery. Pt can get 1750 ml on IS. Discussed sternal precautions. We have given O[facility] booklet and care guide. Pt does not want to watch pre op video but wrote down how to view for family if they are interested. Pt will need to see case manager after surgery as he lives alone and will need Rehab. Luetta NuttingCharlene Arlyce Circle RN BSN 10/12/2015 3:30 PM

## 2015-10-12 NOTE — Progress Notes (Signed)
VASCULAR LAB PRELIMINARY  PRELIMINARY  PRELIMINARY  PRELIMINARY  Pre-op Cardiac Surgery  Carotid Findings:  Bilateral:  1-39% ICA stenosis.  Vertebral artery flow is antegrade.     Upper Extremity Right Left  Brachial Pressures 114 Triphasic 115 Triphasic  Radial Waveforms Triphasic Triphasic  Ulnar Waveforms Triphasic Triphasic  Palmar Arch ([name]'s Test) Normal Normal   Findings:  Doppler waveforms remained normal bilaterally with both radial and ulnar compressions    Lower  Extremity Right Left  Dorsalis Pedis 91 Triphasic 128  Tri[hasic 126 Triphasic 140 Triphasic  Ankle/Brachial Indices 1.10 1.22    Findings:  ABIs and Doppler waveforms are within normal limits bilaterally at rest.   Jordan [name], RVS 10/12/2015, 1:03 PM

## 2015-10-12 NOTE — Progress Notes (Signed)
TELEMETRY: Reviewed telemetry  NSR. No recurrent afib last 24 hours, 9 beat run of SVT.  Filed Vitals:   10/11/15 2302 10/12/15 0014 10/12/15 0513 10/12/15 0709  BP: 93/69 108/76 99/78 123/82  Pulse: 83 96 93 96  Temp:   97.3 F (36.3 C)   TempSrc:   Oral   Resp:   18   Height:      Weight:   95.3 kg (210 lb 1.6 oz)   SpO2: 93% 96% 98% 95%    Intake/Output Summary (Last 24 hours) at 10/12/15 0922 Last data filed at 10/12/15 0514  Gross per 24 hour  Intake   1210 ml  Output   2251 ml  Net  -1041 ml   Filed Weights   10/10/15 0404 10/11/15 0503 10/12/15 0513  Weight: 96.253 kg (212 lb 3.2 oz) 96.6 kg (212 lb 15.4 oz) 95.3 kg (210 lb 1.6 oz)    Subjective Feels worn out after PFTs. Still SOB. No chest pain.   Marland Kitchen aspirin EC  81 mg Oral Daily  . furosemide  80 mg Intravenous BID  . lamoTRIgine  200 mg Oral Daily  . metoprolol succinate  100 mg Oral Daily  . pantoprazole  40 mg Oral Q1200  . simvastatin  20 mg Oral Daily  . sodium chloride  3 mL Intravenous Q12H   . sodium chloride 50 mL/hr at 10/11/15 2301  . amiodarone 30 mg/hr (10/12/15 0619)  . heparin 1,650 Units/hr (10/12/15 0035)    LABS: Basic Metabolic Panel:  Recent Labs  40/98/11 0113  10/11/15 0422 10/12/15 0506  NA 140  < > 142 138  K 3.3*  < > 3.7 3.6  CL 106  < > 104 101  CO2 21*  < > 26 23  GLUCOSE 129*  < > 113* 109*  BUN 45*  < > 42* 36*  CREATININE 2.53*  < > 2.54* 2.36*  CALCIUM 8.4*  < > 8.7* 8.8*  MG 2.0  --   --   --   PHOS  --   --  3.3  --   < > = values in this interval not displayed. Liver Function Tests: No results for input(s): AST, ALT, ALKPHOS, BILITOT, PROT, ALBUMIN in the last 72 hours. No results for input(s): LIPASE, AMYLASE in the last 72 hours. CBC:  Recent Labs  10/11/15 0422 10/12/15 0506  WBC 12.3* 11.6*  HGB 15.5 14.9  HCT 47.3 46.2  MCV 88.6 88.7  PLT 319 294   Cardiac Enzymes:  Recent Labs  10/09/15 1032 10/09/15 1433 10/09/15 2054  TROPONINI  8.56* 7.29* 7.01*   BNP: No results for input(s): PROBNP in the last 72 hours. D-Dimer: No results for input(s): DDIMER in the last 72 hours. Hemoglobin A1C: No results for input(s): HGBA1C in the last 72 hours. Fasting Lipid Panel: No results for input(s): CHOL, HDL, LDLCALC, TRIG, CHOLHDL, LDLDIRECT in the last 72 hours. Thyroid Function Tests: No results for input(s): TSH, T4TOTAL, T3FREE, THYROIDAB in the last 72 hours.  Invalid input(s): FREET3   Radiology/Studies:  No results found. Ecg last pm showed Afib with RVR rate 172. Marked ST depression in septal leads.   Echo: Study Conclusions  - Left ventricle: The cavity size was normal. Wall thickness was normal. Systolic function was normal. The estimated ejection fraction was in the range of 60% to 65%. - Mitral valve: MV not well seen. Appeas to be eccentric MR directed posteriorly. Cannot see prolaspe. Overall mild to moderate  but consider TEE if clinically indicated to further evaluate given poor image qualtity - Left atrium: The atrium was mildly dilated. - Atrial septum: No defect or patent foramen ovale was identified  Procedures    Right/Left Heart Cath and Coronary Angiography    Conclusion    1. Ost RCA to Prox RCA lesion, 50% stenosed. 2. Prox RCA to Dist RCA lesion, 99% stenosed. 3. RPDA lesion, 99% stenosed. 4. Mid Cx to Dist Cx lesion, 100% stenosed. 5. 1st Mrg lesion, 100% stenosed. 6. LM lesion, 60% stenosed. 7. Prox LAD to Mid LAD lesion, 85% stenosed. 8. Ost 1st Diag lesion, 75% stenosed. 9. Mid LAD to Dist LAD lesion, 70% stenosed.   Severe three-vessel coronary disease including left main. There is total occlusion of the first obtuse marginal, the mid circumflex, and functional occlusion of the large PDA. There is at least 50% obstruction in the mid to distal left main, high-grade diffuse disease up to 80% of the proximal to mid LAD, and segmental mid to distal RCA 95-99%  stenosis.  Left ventriculography was not performed. Left ventricular end-diastolic pressure was upper normal with a measured wedge pressure of 11 mmHg high on the LVEDP was measured at 20 mmHg. Normal LVEF by echo this admission.  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 Total of 40 mL contrast used for the procedure.   RECOMMENDATIONS:   Consider coronary bypass grafting (consult called to his TCTS)  Monitor for acute kidney injury  Gentle IV hydration overnight     Indications    Acute systolic heart failure (HCC) [I50.21 (ICD-10-CM)]   NSTEMI (non-ST elevated myocardial infarction) (HCC) [I21.4 (ICD-10-CM)]   Type 2 diabetes mellitus without complication, without long-term current use of insulin (HCC) [E11.9 (ICD-10-CM)]    Technique and Indications    The right radial area was sterilely prepped and draped. Intravenous sedation with Versed and fentanyl was administered. 1% Xylocaine was infiltrated to achieve local analgesia. A double wall stick with an angiocath was utilized to obtain intra-arterial access. The modified Seldinger technique was used to place a 78F " Slender" sheath in the right radial artery. Coronary angiography was done using 5 F catheters. Right coronary angiography was performed with a JR4. Left ventriculography was done using the JR 4 catheter and hand injection. Left coronary angiography was performed with a JL 3.5 cm.  Right heart catheterization was performed via the left antecubital vein. A previously placed Angiocath was exchanged using double glove technique and a 5 French brachial sheath inserted after 1% Xylocaine local infiltration. A 5 French balloon tipped Theone Murdoch was then used for right heart pressure recording and oximetry sampling. Intermittent fluoroscopic guidance was used. No complications occurred. Estimated blood loss <50 mL. There were no immediate complications during the procedure.    Coronary Findings    Dominance: Co-dominant    Left Main   . LM lesion, 60% stenosed. The lesion is type C Calcified eccentric.     Left Anterior Descending   . Prox LAD to Mid LAD lesion, 85% stenosed. The lesion is type C Calcified eccentric.   . Mid LAD to Dist LAD lesion, 70% stenosed. Diffuse eccentric.   . First Diagonal Branch   The vessel is small in size.   Suezanne Jacquet 1st Diag lesion, 75% stenosed.   . Second Diagonal Branch   The vessel is small in size.   . Third Diagonal Branch   The vessel is small in size.     Left Circumflex   .  Mid Cx to Dist Cx lesion, 100% stenosed.   . First Obtuse Marginal Branch   1st Mrg filled by collaterals from 1st RPLB.   . 1st Mrg lesion, 100% stenosed.     Right Coronary Artery   . Ost RCA to Prox RCA lesion, 50% stenosed.   . Prox RCA to Dist RCA lesion, 99% stenosed. The lesion is type C Calcified diffuse with heavy thrombus tubular eccentric.   . Right Posterior Descending Artery   RPDA filled by collaterals from Dist LAD.   Marland Kitchen RPDA lesion, 99% stenosed. The lesion is type C.       Right Heart Pressures Elevated LV EDP consistent with volume overload.    Left Heart    Left Ventricle Evaluated by echocardiography    Coronary Diagrams    Diagnostic Diagram            Implants    Name ID Temporary Type Supply   No information to display    PACS Images    Show images for Cardiac catheterization     Link to Procedure Log    Procedure Log      Hemo Data       Most Recent Value   Fick Cardiac Output  3.5 L/min   Fick Cardiac Output Index  1.71 (L/min)/BSA   RA A Wave  9 mmHg   RA V Wave  6 mmHg   RA Mean  5 mmHg   RV Systolic Pressure  31 mmHg   RV Diastolic Pressure  2 mmHg   RV EDP  7 mmHg   PA Systolic Pressure  34 mmHg   PA Diastolic Pressure  21 mmHg   PA Mean  28 mmHg   PW A Wave  13 mmHg   PW V Wave  13 mmHg   PW Mean  11 mmHg   AO Systolic Pressure  80 mmHg   AO Diastolic Pressure  62 mmHg   AO Mean  71 mmHg    LV Systolic Pressure  84 mmHg   LV Diastolic Pressure  7 mmHg   LV EDP  20 mmHg   Arterial Occlusion Pressure Extended Systolic Pressure  77 mmHg   Arterial Occlusion Pressure Extended Diastolic Pressure  56 mmHg   Arterial Occlusion Pressure Extended Mean Pressure  67 mmHg   Left Ventricular Apex Extended Systolic Pressure  77 mmHg   Left Ventricular Apex Extended Diastolic Pressure  5 mmHg   Left Ventricular Apex Extended EDP Pressure  19 mmHg   QP/QS  1   TPVR Index  16.42 HRUI   TSVR Index  41.63 HRUI   PVR SVR Ratio  0.26   TPVR/TSVR Ratio  0.39      PHYSICAL EXAM General: Well developed, obese, appears dyspneic Head: Normocephalic, atraumatic, sclera non-icteric, oropharynx is clear Neck: Negative for carotid bruits. JVD not elevated. No adenopathy Lungs: bibasilar crackles.  Heart: RRR S1 S2  rubs, or gallops. Soft 1/6 systolic murmur. Abdomen: Soft, non-tender, non-distended with normoactive bowel sounds. No hepatomegaly. No rebound/guarding. No obvious abdominal masses. Msk:  Strength and tone appears normal for age. Extremities: No clubbing, cyanosis or edema.  Distal pedal pulses are 2+ and equal bilaterally. Neuro: Alert and oriented X 3. Moves all extremities spontaneously. Psych:  Responds to questions appropriately with a normal affect.   ASSESSMENT AND PLAN: 1. Acute diastolic CHF. Etiology is AFib with RVR and ischemia. He has mild to moderate MR by Echo but murmur is really minimal. Will  reassess with TEE at time of CABG. Ecg with Afib shows significant septal ischemia and troponins are significantly elevated.  Doubt RAS with normal Renal MRA in 2014. IV lasix resumed at 80 mg bid.  Watch renal function closely. Not a candidate for ACEi/ARB due to CKD. Continue beta blocker. 2. NSTEMI - Severe 3 vessel obstructive CAD by Cath.   Continue ASA and IV heparin for now. Appreciate Dr. Sharee PimpleBartle's input. Plan CABG tomorrow.  3. Atrial fibrillation with RVR.  Poorly tolerated. Controlled now on IV amiodarone. Will continue until after surgery. Would consider MAZE procedure with CABG.  Patient's [place] vasc score is 4. He will probably need long term anticoagulation long term. It is possible that this is all ischemic mediated and may improve with revascularization. He is probably not be a good candidate for a NOAC and would need coumadin. 4. CKD stage 4. 5. History of gastritis in July 2016 secondary to ASA and Goody's powders. Counseled on use of only 81 mg ASA daily. Depending on results of cath may use anticoagulation only.  6. Chronic cevicalgia 7. Sleep apnea.  8. HTN controlled.  Present on Admission:  . Acute diastolic (congestive) heart failure (HCC) . Demand ischemia (HCC) . Stage III chronic kidney disease . Cervical spondylosis with radiculopathy . Hypertensive heart disease . Hyperlipidemia . Non-STEMI (non-ST elevated myocardial infarction) (HCC)  Signed, Savera Donson Swazil[place], MDFACC 10/12/2015 9:22 AM

## 2015-10-12 NOTE — Progress Notes (Signed)
1 Day Post-Op Procedure(s) (LRB): Right/Left Heart Cath and Coronary Angiography (N/A) Subjective: Sleeping now. According to his wife he did not get much rest last night.  Objective: Vital signs in last 24 hours: Temp:  [97.3 F (36.3 C)-98.1 F (36.7 C)] 97.3 F (36.3 C) (12/22 0513) Pulse Rate:  [0-96] 96 (12/22 0709) Cardiac Rhythm:  [-] Normal sinus rhythm (12/22 0700) Resp:  [0-51] 18 (12/22 0513) BP: (85-134)/(55-82) 123/82 mmHg (12/22 0709) SpO2:  [0 %-100 %] 95 % (12/22 0709) Weight:  [95.3 kg (210 lb 1.6 oz)] 95.3 kg (210 lb 1.6 oz) (12/22 0513)  Hemodynamic parameters for last 24 hours:    Intake/Output from previous day: 12/21 0701 - 12/22 0700 In: 1210 [P.O.:1210] Out: 2751 [Urine:2750; Stool:1] Intake/Output this shift: Total I/O In: 240 [P.O.:240] Out: -     Lab Results:  Recent Labs  10/11/15 0422 10/12/15 0506  WBC 12.3* 11.6*  HGB 15.5 14.9  HCT 47.3 46.2  PLT 319 294   BMET:  Recent Labs  10/11/15 0422 10/12/15 0506  NA 142 138  K 3.7 3.6  CL 104 101  CO2 26 23  GLUCOSE 113* 109*  BUN 42* 36*  CREATININE 2.54* 2.36*  CALCIUM 8.7* 8.8*    PT/INR: No results for input(s): LABPROT, INR in the last 72 hours. ABG    Component Value Date/Time   PHART 7.493* 10/11/2015 1317   HCO3 21.3 10/11/2015 1317   TCO2 22 10/11/2015 1317   ACIDBASEDEF 1.0 10/11/2015 1317   O2SAT 93.0 10/11/2015 1317   CBG (last 3)   Recent Labs  10/12/15 0622  GLUCAP 111*    Assessment/Plan: S/P Procedure(s) (LRB): Right/Left Heart Cath and Coronary Angiography (N/A)  Left main and severe 3-vessel coronary disease s/p NSTEMI with pulmonary edema.   Stage 3-4 chronic kidney disease. His creat is 2.3 today so it should be ok to proceed with CABG tomorrow.  New onset atrial fibrillation in the setting of ischemia and MI. No prior history.   I will plan CABG tomorrow am. I don't think a MAZE is indicated in this 69 year old with CKD and elevated creat  who has new onset atrial fib in the setting of ischemia and infarction. He needs the shortest operation possible to minimize the renal insult. It is unknown if he will continue to have atrial fibrillation longer term and no difference in long term prognosis with rate control vs maintenance of sinus rhythm. The risk of adding a MAZE outweighs the benefits.  I discussed the operative procedure with the patient's wife ( they live separately but still take care of each other) who was not here yesterday including alternatives, benefits and risks; including but not limited to bleeding, blood transfusion, infection, stroke, myocardial infarction, graft failure, heart block requiring a permanent pacemaker, organ dysfunction, and death.   LOS: 4 days    Alleen BorneBryan K Cheryn Lundquist 10/12/2015

## 2015-10-12 NOTE — Progress Notes (Signed)
S: Still with SOB.  Being taken for PFT's O:BP 123/82 mmHg  Pulse 96  Temp(Src) 97.3 F (36.3 C) (Oral)  Resp 18  Ht 5\' 6"  (1.676 m)  Wt 95.3 kg (210 lb 1.6 oz)  BMI 33.93 kg/m2  SpO2 95%  Intake/Output Summary (Last 24 hours) at 10/12/15 0733 Last data filed at 10/12/15 0514  Gross per 24 hour  Intake   1210 ml  Output   2251 ml  Net  -1041 ml   Weight change: -1.3 kg (-2 lb 13.9 oz) MVH:QIONGGen:Awake and alert CVS: RRR Resp:  basilar crackles Abd:+ BS NTND Ext: no edema NEURO: CNI M&SI Ox3 no asterixis   . aspirin EC  81 mg Oral Daily  . furosemide  80 mg Intravenous BID  . lamoTRIgine  200 mg Oral Daily  . metoprolol succinate  100 mg Oral Daily  . pantoprazole  40 mg Oral Q1200  . simvastatin  20 mg Oral Daily  . sodium chloride  3 mL Intravenous Q12H   No results found. BMET    Component Value Date/Time   NA 138 10/12/2015 0506   K 3.6 10/12/2015 0506   CL 101 10/12/2015 0506   CO2 23 10/12/2015 0506   GLUCOSE 109* 10/12/2015 0506   BUN 36* 10/12/2015 0506   CREATININE 2.36* 10/12/2015 0506   CREATININE 1.96* 11/28/2012 1233   CALCIUM 8.8* 10/12/2015 0506   GFRNONAA 26* 10/12/2015 0506   GFRAA 30* 10/12/2015 0506   CBC    Component Value Date/Time   WBC 11.6* 10/12/2015 0506   RBC 5.21 10/12/2015 0506   RBC 3.29* 01/16/2012 2214   HGB 14.9 10/12/2015 0506   HCT 46.2 10/12/2015 0506   PLT 294 10/12/2015 0506   MCV 88.7 10/12/2015 0506   MCH 28.6 10/12/2015 0506   MCHC 32.3 10/12/2015 0506   RDW 17.5* 10/12/2015 0506   LYMPHSABS 0.9 08/23/2013 1054   MONOABS 1.0 08/23/2013 1054   EOSABS 0.0 08/23/2013 1054   BASOSABS 0.0 08/23/2013 1054     Assessment:  1. CKD 3/4, Scr stable today, UO good 2.  A fib, now NSR 3. Pulm edema 4. HTN, though BP lowish now 5. CAD Lt main and 3V  Plan: 1.  Note plan for CABG tomorrow if renal fx stable 2. Cont IV lasix 3. Daily SCr.  Will follow with you   Aaniya Sterba T

## 2015-10-12 NOTE — Progress Notes (Signed)
ANTICOAGULATION CONSULT NOTE - Follow Up Consult  Pharmacy Consult for Heparin Indication: atrial fibrillation  No Known Allergies  Patient Measurements: Height: 5\' 6"  (167.6 cm) Weight: 210 lb 1.6 oz (95.3 kg) IBW/kg (Calculated) : 63.8 Heparin Dosing Weight: 86kg  Vital Signs: Temp: 97.3 F (36.3 C) (12/22 0513) Temp Source: Oral (12/22 0513) BP: 123/82 mmHg (12/22 0709) Pulse Rate: 96 (12/22 0709)  Labs:  Recent Labs  10/09/15 1433  10/09/15 2054  10/10/15 0332  10/11/15 0422 10/12/15 0506 10/12/15 1315  HGB  --   --   --   < > 14.7  --  15.5 14.9  --   HCT  --   --   --   --  44.7  --  47.3 46.2  --   PLT  --   --   --   --  290  --  319 294  --   HEPARINUNFRC  --   < >  --   --  0.40  < > 0.56 0.27* 0.45  CREATININE  --   --   --   < > 2.50*  --  2.54* 2.36*  --   TROPONINI 7.29*  --  7.01*  --   --   --   --   --   --   < > = values in this interval not displayed.  Estimated Creatinine Clearance: 31 mL/min (by C-G formula based on Cr of 2.36).  Assessment: 69yom continues on heparin, now s/p cardiac cath 12/21 which was reported with severe 3v disease. Heparin resumed post-cath with plans for CABG tomorrow. Heparin level is therapeutic on 1650 units/hr.   Goal of Therapy:  Heparin level 0.3-0.7 units/ml Monitor platelets by anticoagulation protocol: Yes   Plan:  - Continue heparin gtt at 1650 units/hr - Daily heparin level and CBC - F/u surgery plans  Lysle Pearlachel Katasha Riga, PharmD, BCPS Pager # [phone] 10/12/2015 1:42 PM

## 2015-10-12 NOTE — Progress Notes (Signed)
ANTICOAGULATION CONSULT NOTE - Follow Up Consult  Pharmacy Consult for heparin Indication: atrial fibrillation   Labs:  Recent Labs  10/09/15 1032 10/09/15 1433  10/09/15 2054  10/10/15 0332 10/10/15 1105 10/11/15 0422 10/12/15 0506  HGB  --   --   --   --   --  14.7  --  15.5 14.9  HCT  --   --   --   --   --  44.7  --  47.3 46.2  PLT  --   --   --   --   --  290  --  319 294  HEPARINUNFRC  --   --   < >  --   --  0.40 0.41 0.56 0.27*  CREATININE  --   --   --   --   < > 2.50*  --  2.54* 2.36*  TROPONINI 8.56* 7.29*  --  7.01*  --   --   --   --   --   < > = values in this interval not displayed.    Assessment/Plan:  69yo male slightly therapeutic on heparin after resumed though was started late and likely needs more time to accumulate, was previously therapeutic at this rate for several levels. Will continue gtt at current rate and confirm stable with additional level.   Vernard GamblesVeronda Rmoni Keplinger, PharmD, BCPS  10/12/2015,5:56 AM

## 2015-10-12 NOTE — Consult Note (Signed)
   [facility]   10/12/2015  [facility] Jun 05, 1946 [id number] Patient was screen for referral received to assess for care management services. Patient admitted with HF and for ongoing education needs.  Came to bedside to see patient regarding [facility] as a covered benefit of [employer] Medicare insurance.   Patient was having some nursing care being managed.  Spoke with inpatient RNCM, Lelon MastSamantha, patient is being scheduled for a CABG. Will continue to follow up for discharge planning needs, as permitted.  For questions, please contact: Charlesetta ShanksVictoria Waylynn Benefiel, RN BSN CCM [facility]  [phone] business mobile phone

## 2015-10-12 NOTE — Progress Notes (Signed)
Patient reported chest pain at 2200. Given nitro X3. Rapid Response was called for 2nd opinion. Patient sitting in bed due to SOB. In 6L [place] saturating at 91% to 96%. Given tylenol for back pain. Will continue to monitor closely.   Jordan [name] [name]

## 2015-10-12 NOTE — Anesthesia Preprocedure Evaluation (Addendum)
Anesthesia Evaluation  Patient identified by MRN, date of birth, ID band Patient awake    Reviewed: Allergy & Precautions, H&P , NPO status , Patient's Chart, lab work & pertinent test results  Airway Mallampati: I  TM Distance: >3 FB Neck ROM: Full    Dental  (+) Dental Advidsory Given   Pulmonary sleep apnea , former smoker,    breath sounds clear to auscultation       Cardiovascular hypertension, Pt. on medications + CAD, + Past MI and +CHF   Rhythm:Regular Rate:Normal     Neuro/Psych  Headaches,    GI/Hepatic hiatal hernia,   Endo/Other    Renal/GU Renal InsufficiencyRenal disease     Musculoskeletal   Abdominal   Peds  Hematology   Anesthesia Other Findings - Left ventricle: The cavity size was normal. Wall thickness was normal. Systolic function was normal. The estimated ejection fraction was in the range of 60% to 65%. - Mitral valve: MV not well seen. Appeas to be eccentric MR directed posteriorly. Cannot see prolaspe. Overall mild to moderate but consider TEE if clinically indicated to further evaluate given poor image qualtity - Left atrium: The atrium was mildly dilated. - Atrial septum: No defect or patent foramen ovale was identified.  Reproductive/Obstetrics                           Anesthesia Physical  Anesthesia Plan  ASA: IV  Anesthesia Plan: General   Post-op Pain Management:    Induction: Intravenous  Airway Management Planned: Oral ETT  Additional Equipment: Arterial line, PA Cath, TEE, CVP and Ultrasound Guidance Line Placement  Intra-op Plan:   Post-operative Plan: Post-operative intubation/ventilation  Informed Consent: I have reviewed the patients History and Physical, chart, labs and discussed the procedure including the risks, benefits and alternatives for the proposed anesthesia with the patient or authorized representative who has  indicated his/her understanding and acceptance.   Dental advisory given and Dental Advisory Given  Plan Discussed with: CRNA and Anesthesiologist  Anesthesia Plan Comments:        Anesthesia Quick Evaluation

## 2015-10-13 ENCOUNTER — Inpatient Hospital Stay (HOSPITAL_COMMUNITY): Payer: PPO

## 2015-10-13 ENCOUNTER — Encounter (HOSPITAL_COMMUNITY)
Admission: EM | Disposition: A | Discharge status: Skilled Nursing Facility | Payer: PPO | Source: Home / Self Care | Attending: Surgery

## 2015-10-13 ENCOUNTER — Inpatient Hospital Stay (HOSPITAL_COMMUNITY): Payer: PPO | Admitting: Anesthesiology

## 2015-10-13 ENCOUNTER — Encounter (HOSPITAL_COMMUNITY): Payer: Self-pay | Admitting: Anesthesiology

## 2015-10-13 DIAGNOSIS — I071 Rheumatic tricuspid insufficiency: Secondary | ICD-10-CM | POA: Diagnosis present

## 2015-10-13 DIAGNOSIS — I361 Nonrheumatic tricuspid (valve) insufficiency: Secondary | ICD-10-CM

## 2015-10-13 DIAGNOSIS — I340 Nonrheumatic mitral (valve) insufficiency: Secondary | ICD-10-CM

## 2015-10-13 DIAGNOSIS — I2510 Atherosclerotic heart disease of native coronary artery without angina pectoris: Secondary | ICD-10-CM

## 2015-10-13 HISTORY — PX: TRICUSPID VALVE REPLACEMENT: SHX816

## 2015-10-13 HISTORY — PX: TEE WITHOUT CARDIOVERSION: SHX5443

## 2015-10-13 HISTORY — PX: MITRAL VALVE REPLACEMENT: SHX147

## 2015-10-13 HISTORY — PX: CORONARY ARTERY BYPASS GRAFT: SHX141

## 2015-10-13 LAB — POCT I-STAT, CHEM 8
BUN: 34 mg/dL — AB (ref 6–20)
BUN: 32 mg/dL — AB (ref 6–20)
BUN: 33 mg/dL — ABNORMAL HIGH (ref 6–20)
BUN: 31 mg/dL — ABNORMAL HIGH (ref 6–20)
BUN: 36 mg/dL — AB (ref 6–20)
BUN: 37 mg/dL — ABNORMAL HIGH (ref 6–20)
BUN: 28 mg/dL — AB (ref 6–20)
CALCIUM ION: 1.18 mmol/L (ref 1.13–1.30)
CALCIUM ION: 1.05 mmol/L — AB (ref 1.13–1.30)
CALCIUM ION: 1.03 mmol/L — AB (ref 1.13–1.30)
CHLORIDE: 100 mmol/L — AB (ref 101–111)
CHLORIDE: 99 mmol/L — AB (ref 101–111)
CREATININE: 2.4 mg/dL — AB (ref 0.61–1.24)
CREATININE: 2.4 mg/dL — AB (ref 0.61–1.24)
CREATININE: 2.2 mg/dL — AB (ref 0.61–1.24)
CREATININE: 2 mg/dL — AB (ref 0.61–1.24)
Calcium, Ion: 1.17 mmol/L (ref 1.13–1.30)
Calcium, Ion: 1.04 mmol/L — ABNORMAL LOW (ref 1.13–1.30)
Calcium, Ion: 1.03 mmol/L — ABNORMAL LOW (ref 1.13–1.30)
Calcium, Ion: 1.12 mmol/L — ABNORMAL LOW (ref 1.13–1.30)
Chloride: 103 mmol/L (ref 101–111)
Chloride: 100 mmol/L — ABNORMAL LOW (ref 101–111)
Chloride: 98 mmol/L — ABNORMAL LOW (ref 101–111)
Chloride: 101 mmol/L (ref 101–111)
Chloride: 101 mmol/L (ref 101–111)
Creatinine, Ser: 2 mg/dL — ABNORMAL HIGH (ref 0.61–1.24)
Creatinine, Ser: 2.2 mg/dL — ABNORMAL HIGH (ref 0.61–1.24)
Creatinine, Ser: 2.2 mg/dL — ABNORMAL HIGH (ref 0.61–1.24)
Glucose, Bld: 124 mg/dL — ABNORMAL HIGH (ref 65–99)
Glucose, Bld: 148 mg/dL — ABNORMAL HIGH (ref 65–99)
Glucose, Bld: 161 mg/dL — ABNORMAL HIGH (ref 65–99)
Glucose, Bld: 165 mg/dL — ABNORMAL HIGH (ref 65–99)
Glucose, Bld: 186 mg/dL — ABNORMAL HIGH (ref 65–99)
Glucose, Bld: 219 mg/dL — ABNORMAL HIGH (ref 65–99)
Glucose, Bld: 143 mg/dL — ABNORMAL HIGH (ref 65–99)
HEMATOCRIT: 43 % (ref 39.0–52.0)
HEMATOCRIT: 30 % — AB (ref 39.0–52.0)
HEMATOCRIT: 41 % (ref 39.0–52.0)
HEMATOCRIT: 29 % — AB (ref 39.0–52.0)
HEMATOCRIT: 31 % — AB (ref 39.0–52.0)
HEMATOCRIT: 27 % — AB (ref 39.0–52.0)
HEMATOCRIT: 32 % — AB (ref 39.0–52.0)
HEMOGLOBIN: 10.2 g/dL — AB (ref 13.0–17.0)
HEMOGLOBIN: 13.9 g/dL (ref 13.0–17.0)
HEMOGLOBIN: 9.9 g/dL — AB (ref 13.0–17.0)
Hemoglobin: 14.6 g/dL (ref 13.0–17.0)
Hemoglobin: 10.5 g/dL — ABNORMAL LOW (ref 13.0–17.0)
Hemoglobin: 9.2 g/dL — ABNORMAL LOW (ref 13.0–17.0)
Hemoglobin: 10.9 g/dL — ABNORMAL LOW (ref 13.0–17.0)
POTASSIUM: 3.4 mmol/L — AB (ref 3.5–5.1)
POTASSIUM: 3.8 mmol/L (ref 3.5–5.1)
POTASSIUM: 4.1 mmol/L (ref 3.5–5.1)
POTASSIUM: 4 mmol/L (ref 3.5–5.1)
Potassium: 3.5 mmol/L (ref 3.5–5.1)
Potassium: 3.9 mmol/L (ref 3.5–5.1)
Potassium: 4.2 mmol/L (ref 3.5–5.1)
SODIUM: 137 mmol/L (ref 135–145)
SODIUM: 138 mmol/L (ref 135–145)
SODIUM: 137 mmol/L (ref 135–145)
SODIUM: 135 mmol/L (ref 135–145)
SODIUM: 133 mmol/L — AB (ref 135–145)
SODIUM: 136 mmol/L (ref 135–145)
Sodium: 138 mmol/L (ref 135–145)
TCO2: 24 mmol/L (ref 0–100)
TCO2: 25 mmol/L (ref 0–100)
TCO2: 28 mmol/L (ref 0–100)
TCO2: 27 mmol/L (ref 0–100)
TCO2: 26 mmol/L (ref 0–100)
TCO2: 25 mmol/L (ref 0–100)
TCO2: 23 mmol/L (ref 0–100)

## 2015-10-13 LAB — MAGNESIUM
MAGNESIUM: 2.1 mg/dL (ref 1.7–2.4)
MAGNESIUM: 2.4 mg/dL (ref 1.7–2.4)

## 2015-10-13 LAB — POCT I-STAT 3, ART BLOOD GAS (G3+)
ACID-BASE EXCESS: 1 mmol/L (ref 0.0–2.0)
Acid-base deficit: 4 mmol/L — ABNORMAL HIGH (ref 0.0–2.0)
Acid-base deficit: 4 mmol/L — ABNORMAL HIGH (ref 0.0–2.0)
Acid-base deficit: 2 mmol/L (ref 0.0–2.0)
BICARBONATE: 22.8 meq/L (ref 20.0–24.0)
BICARBONATE: 24.5 meq/L — AB (ref 20.0–24.0)
Bicarbonate: 26.9 mEq/L — ABNORMAL HIGH (ref 20.0–24.0)
Bicarbonate: 23 mEq/L (ref 20.0–24.0)
O2 Saturation: 100 %
O2 Saturation: 77 %
O2 Saturation: 89 %
O2 Saturation: 92 %
PCO2 ART: 47.9 mmHg — AB (ref 35.0–45.0)
PCO2 ART: 47.3 mmHg — AB (ref 35.0–45.0)
PCO2 ART: 46.5 mmHg — AB (ref 35.0–45.0)
PH ART: 7.286 — AB (ref 7.350–7.450)
PH ART: 7.291 — AB (ref 7.350–7.450)
PH ART: 7.326 — AB (ref 7.350–7.450)
PO2 ART: 397 mmHg — AB (ref 80.0–100.0)
PO2 ART: 47 mmHg — AB (ref 80.0–100.0)
PO2 ART: 66 mmHg — AB (ref 80.0–100.0)
Patient temperature: 36.3
TCO2: 28 mmol/L (ref 0–100)
TCO2: 24 mmol/L (ref 0–100)
TCO2: 24 mmol/L (ref 0–100)
TCO2: 26 mmol/L (ref 0–100)
pCO2 arterial: 50.8 mmHg — ABNORMAL HIGH (ref 35.0–45.0)
pH, Arterial: 7.333 — ABNORMAL LOW (ref 7.350–7.450)
pO2, Arterial: 61 mmHg — ABNORMAL LOW (ref 80.0–100.0)

## 2015-10-13 LAB — PROTIME-INR
INR: 1.63 — ABNORMAL HIGH (ref 0.00–1.49)
Prothrombin Time: 19.4 seconds — ABNORMAL HIGH (ref 11.6–15.2)

## 2015-10-13 LAB — HEMOGLOBIN AND HEMATOCRIT, BLOOD
HCT: 27.7 % — ABNORMAL LOW (ref 39.0–52.0)
Hemoglobin: 9.2 g/dL — ABNORMAL LOW (ref 13.0–17.0)

## 2015-10-13 LAB — GLUCOSE, CAPILLARY: GLUCOSE-CAPILLARY: 91 mg/dL (ref 65–99)

## 2015-10-13 LAB — CBC
HCT: 45.1 % (ref 39.0–52.0)
HCT: 30.8 % — ABNORMAL LOW (ref 39.0–52.0)
HCT: 31.5 % — ABNORMAL LOW (ref 39.0–52.0)
HEMOGLOBIN: 10.1 g/dL — AB (ref 13.0–17.0)
Hemoglobin: 14.2 g/dL (ref 13.0–17.0)
Hemoglobin: 10.2 g/dL — ABNORMAL LOW (ref 13.0–17.0)
MCH: 27.7 pg (ref 26.0–34.0)
MCH: 28.6 pg (ref 26.0–34.0)
MCH: 27.6 pg (ref 26.0–34.0)
MCHC: 31.5 g/dL (ref 30.0–36.0)
MCHC: 33.1 g/dL (ref 30.0–36.0)
MCHC: 32.1 g/dL (ref 30.0–36.0)
MCV: 88.1 fL (ref 78.0–100.0)
MCV: 86.3 fL (ref 78.0–100.0)
MCV: 86.1 fL (ref 78.0–100.0)
PLATELETS: 271 10*3/uL (ref 150–400)
PLATELETS: 149 10*3/uL — AB (ref 150–400)
PLATELETS: 190 10*3/uL (ref 150–400)
RBC: 5.12 MIL/uL (ref 4.22–5.81)
RBC: 3.57 MIL/uL — AB (ref 4.22–5.81)
RBC: 3.66 MIL/uL — AB (ref 4.22–5.81)
RDW: 17.5 % — ABNORMAL HIGH (ref 11.5–15.5)
RDW: 16.5 % — ABNORMAL HIGH (ref 11.5–15.5)
RDW: 16.4 % — ABNORMAL HIGH (ref 11.5–15.5)
WBC: 9.5 10*3/uL (ref 4.0–10.5)
WBC: 15.2 10*3/uL — AB (ref 4.0–10.5)
WBC: 17.2 10*3/uL — ABNORMAL HIGH (ref 4.0–10.5)

## 2015-10-13 LAB — POCT I-STAT 4, (NA,K, GLUC, HGB,HCT)
Glucose, Bld: 116 mg/dL — ABNORMAL HIGH (ref 65–99)
HCT: 30 % — ABNORMAL LOW (ref 39.0–52.0)
HEMOGLOBIN: 10.2 g/dL — AB (ref 13.0–17.0)
POTASSIUM: 3.6 mmol/L (ref 3.5–5.1)
Sodium: 137 mmol/L (ref 135–145)

## 2015-10-13 LAB — BASIC METABOLIC PANEL
Anion gap: 12 (ref 5–15)
BUN: 35 mg/dL — AB (ref 6–20)
CHLORIDE: 103 mmol/L (ref 101–111)
CO2: 25 mmol/L (ref 22–32)
CREATININE: 2.44 mg/dL — AB (ref 0.61–1.24)
Calcium: 9.1 mg/dL (ref 8.9–10.3)
GFR calc Af Amer: 29 mL/min — ABNORMAL LOW (ref >60)
GFR calc non Af Amer: 25 mL/min — ABNORMAL LOW (ref >60)
Glucose, Bld: 105 mg/dL — ABNORMAL HIGH (ref 65–99)
Potassium: 3.6 mmol/L (ref 3.5–5.1)
Sodium: 140 mmol/L (ref 135–145)

## 2015-10-13 LAB — RENAL FUNCTION PANEL
Albumin: 3.1 g/dL — ABNORMAL LOW (ref 3.5–5.0)
Anion gap: 12 (ref 5–15)
BUN: 35 mg/dL — AB (ref 6–20)
CHLORIDE: 103 mmol/L (ref 101–111)
CO2: 25 mmol/L (ref 22–32)
CREATININE: 2.41 mg/dL — AB (ref 0.61–1.24)
Calcium: 9.1 mg/dL (ref 8.9–10.3)
GFR calc Af Amer: 29 mL/min — ABNORMAL LOW (ref >60)
GFR calc non Af Amer: 25 mL/min — ABNORMAL LOW (ref >60)
Glucose, Bld: 101 mg/dL — ABNORMAL HIGH (ref 65–99)
Phosphorus: 3.8 mg/dL (ref 2.5–4.6)
Potassium: 3.6 mmol/L (ref 3.5–5.1)
Sodium: 140 mmol/L (ref 135–145)

## 2015-10-13 LAB — CREATININE, SERUM
Creatinine, Ser: 2.22 mg/dL — ABNORMAL HIGH (ref 0.61–1.24)
GFR, EST AFRICAN AMERICAN: 33 mL/min — AB (ref >60)
GFR, EST NON AFRICAN AMERICAN: 28 mL/min — AB (ref >60)

## 2015-10-13 LAB — HEPARIN LEVEL (UNFRACTIONATED): HEPARIN UNFRACTIONATED: 0.11 [IU]/mL — AB (ref 0.30–0.70)

## 2015-10-13 LAB — APTT: APTT: 34 s (ref 24–37)

## 2015-10-13 LAB — PLATELET COUNT: Platelets: 137 10*3/uL — ABNORMAL LOW (ref 150–400)

## 2015-10-13 SURGERY — CORONARY ARTERY BYPASS GRAFTING (CABG)
Anesthesia: General | Site: Chest

## 2015-10-13 MED ORDER — ACETAMINOPHEN 160 MG/5ML PO SOLN: 650.0000 mg | Freq: Once | ORAL | Status: AC

## 2015-10-13 MED ORDER — THROMBIN 20000 UNITS EX SOLR
OROMUCOSAL | Status: DC | PRN
  Administered 2015-10-13 (×4): 4 mL via TOPICAL

## 2015-10-13 MED ORDER — CHLORHEXIDINE GLUCONATE 0.12 % MT SOLN
15.0000 mL | OROMUCOSAL | Status: AC
  Administered 2015-10-13: 15 mL via OROMUCOSAL

## 2015-10-13 MED ORDER — DEXTROSE 5 % IV SOLN
1.5000 g | Freq: Two times a day (BID) | INTRAVENOUS | Status: AC
  Administered 2015-10-13 – 2015-10-15 (×4): 1.5 g via INTRAVENOUS
  Filled 2015-10-13 (×4): qty 1.5

## 2015-10-13 MED ORDER — ALBUMIN HUMAN 5 % IV SOLN
250.0000 mL | INTRAVENOUS | Status: AC | PRN
  Administered 2015-10-13 – 2015-10-14 (×2): 250 mL via INTRAVENOUS

## 2015-10-13 MED ORDER — ALUM & MAG HYDROXIDE-SIMETH 200-200-20 MG/5ML PO SUSP: 15.0000 mL | Freq: Once | ORAL | Status: DC

## 2015-10-13 MED ORDER — HEMOSTATIC AGENTS (NO CHARGE) OPTIME
TOPICAL | Status: DC | PRN
  Administered 2015-10-13 (×3): 1 via TOPICAL

## 2015-10-13 MED ORDER — OXYCODONE HCL 5 MG PO TABS
5.0000 mg | ORAL_TABLET | ORAL | Status: DC | PRN
  Administered 2015-10-16 – 2015-10-17 (×7): 10 mg via ORAL
  Administered 2015-10-18: 5 mg via ORAL
  Administered 2015-10-18 – 2015-10-20 (×10): 10 mg via ORAL
  Filled 2015-10-13 (×16): qty 2
  Filled 2015-10-13: qty 1
  Filled 2015-10-13: qty 2

## 2015-10-13 MED ORDER — MILRINONE IN DEXTROSE 20 MG/100ML IV SOLN
0.1250 ug/kg/min | INTRAVENOUS | Status: DC
  Filled 2015-10-13: qty 100

## 2015-10-13 MED ORDER — LACTATED RINGERS IV SOLN: 500.0000 mL | Freq: Once | INTRAVENOUS | Status: DC | PRN

## 2015-10-13 MED ORDER — TRAMADOL HCL 50 MG PO TABS: 50.0000 mg | ORAL_TABLET | ORAL | Status: DC | PRN

## 2015-10-13 MED ORDER — DEXMEDETOMIDINE HCL IN NACL 200 MCG/50ML IV SOLN
0.0000 ug/kg/h | INTRAVENOUS | Status: DC
  Administered 2015-10-13: 0.7 ug/kg/h via INTRAVENOUS
  Filled 2015-10-13: qty 50

## 2015-10-13 MED ORDER — PROTAMINE SULFATE 10 MG/ML IV SOLN
INTRAVENOUS | Status: AC
  Filled 2015-10-13: qty 25

## 2015-10-13 MED ORDER — PROPOFOL 10 MG/ML IV BOLUS
INTRAVENOUS | Status: AC
  Filled 2015-10-13: qty 20

## 2015-10-13 MED ORDER — ONDANSETRON HCL 4 MG/2ML IJ SOLN: 4.0000 mg | Freq: Four times a day (QID) | INTRAMUSCULAR | Status: DC | PRN

## 2015-10-13 MED ORDER — ROCURONIUM BROMIDE 50 MG/5ML IV SOLN
INTRAVENOUS | Status: AC
  Filled 2015-10-13: qty 2

## 2015-10-13 MED ORDER — PROPOFOL 10 MG/ML IV BOLUS
INTRAVENOUS | Status: DC | PRN
  Administered 2015-10-13: 60 mg via INTRAVENOUS

## 2015-10-13 MED ORDER — PROTAMINE SULFATE 10 MG/ML IV SOLN
INTRAVENOUS | Status: DC | PRN
Start: 2015-10-13 — End: 2015-10-13
  Administered 2015-10-13: 50 mg via INTRAVENOUS
  Administered 2015-10-13: 150 mg via INTRAVENOUS
  Administered 2015-10-13: 50 mg via INTRAVENOUS

## 2015-10-13 MED ORDER — HEPARIN SODIUM (PORCINE) 1000 UNIT/ML IJ SOLN
INTRAMUSCULAR | Status: AC
  Filled 2015-10-13: qty 1

## 2015-10-13 MED ORDER — VECURONIUM BROMIDE 10 MG IV SOLR
INTRAVENOUS | Status: AC
  Filled 2015-10-13: qty 20

## 2015-10-13 MED ORDER — ACETAMINOPHEN 650 MG RE SUPP
650.0000 mg | Freq: Once | RECTAL | Status: AC
  Administered 2015-10-13: 650 mg via RECTAL

## 2015-10-13 MED ORDER — CHLORHEXIDINE GLUCONATE 0.12% ORAL RINSE (MEDLINE KIT)
15.0000 mL | Freq: Two times a day (BID) | OROMUCOSAL | Status: DC
  Administered 2015-10-13 – 2015-10-24 (×16): 15 mL via OROMUCOSAL

## 2015-10-13 MED ORDER — METOPROLOL TARTRATE 1 MG/ML IV SOLN: 2.5000 mg | INTRAVENOUS | Status: DC | PRN

## 2015-10-13 MED ORDER — METOPROLOL TARTRATE 12.5 MG HALF TABLET: 12.5000 mg | ORAL_TABLET | Freq: Two times a day (BID) | ORAL | Status: DC

## 2015-10-13 MED ORDER — MAGNESIUM SULFATE 4 GM/100ML IV SOLN: 4.0000 g | Freq: Once | INTRAVENOUS | Status: DC

## 2015-10-13 MED ORDER — SODIUM BICARBONATE 8.4 % IV SOLN
50.0000 meq | Freq: Once | INTRAVENOUS | Status: AC
  Administered 2015-10-13: 50 meq via INTRAVENOUS

## 2015-10-13 MED ORDER — ALBUMIN HUMAN 5 % IV SOLN
INTRAVENOUS | Status: DC | PRN
  Administered 2015-10-13 (×3): via INTRAVENOUS

## 2015-10-13 MED ORDER — MIDAZOLAM HCL 2 MG/2ML IJ SOLN
2.0000 mg | INTRAMUSCULAR | Status: DC | PRN
  Administered 2015-10-14: 2 mg via INTRAVENOUS
  Filled 2015-10-13: qty 2

## 2015-10-13 MED ORDER — NITROGLYCERIN IN D5W 200-5 MCG/ML-% IV SOLN: 0.0000 ug/min | INTRAVENOUS | Status: DC

## 2015-10-13 MED ORDER — INSULIN REGULAR BOLUS VIA INFUSION
0.0000 [IU] | Freq: Three times a day (TID) | INTRAVENOUS | Status: DC
  Filled 2015-10-13: qty 10

## 2015-10-13 MED ORDER — MILRINONE IN DEXTROSE 20 MG/100ML IV SOLN
0.1250 ug/kg/min | INTRAVENOUS | Status: DC
  Administered 2015-10-13 – 2015-10-15 (×4): 0.25 ug/kg/min via INTRAVENOUS
  Filled 2015-10-13 (×4): qty 100

## 2015-10-13 MED ORDER — FAMOTIDINE IN NACL 20-0.9 MG/50ML-% IV SOLN
20.0000 mg | Freq: Two times a day (BID) | INTRAVENOUS | Status: AC
  Administered 2015-10-13 – 2015-10-14 (×2): 20 mg via INTRAVENOUS
  Filled 2015-10-13: qty 50

## 2015-10-13 MED ORDER — MIDAZOLAM HCL 5 MG/5ML IJ SOLN
INTRAMUSCULAR | Status: DC | PRN
  Administered 2015-10-13: 1 mg via INTRAVENOUS
  Administered 2015-10-13: 4 mg via INTRAVENOUS
  Administered 2015-10-13: 2 mg via INTRAVENOUS
  Administered 2015-10-13: 1 mg via INTRAVENOUS
  Administered 2015-10-13: 2 mg via INTRAVENOUS
  Administered 2015-10-13: 3 mg via INTRAVENOUS
  Administered 2015-10-13: 1 mg via INTRAVENOUS

## 2015-10-13 MED ORDER — ARTIFICIAL TEARS OP OINT
TOPICAL_OINTMENT | OPHTHALMIC | Status: AC
  Filled 2015-10-13: qty 3.5

## 2015-10-13 MED ORDER — VECURONIUM BROMIDE 10 MG IV SOLR
INTRAVENOUS | Status: DC | PRN
  Administered 2015-10-13 (×2): 5 mg via INTRAVENOUS
  Administered 2015-10-13: 10 mg via INTRAVENOUS
  Administered 2015-10-13 (×2): 5 mg via INTRAVENOUS

## 2015-10-13 MED ORDER — DOCUSATE SODIUM 100 MG PO CAPS
200.0000 mg | ORAL_CAPSULE | Freq: Every day | ORAL | Status: DC
  Administered 2015-10-16 – 2015-10-19 (×3): 200 mg via ORAL
  Filled 2015-10-13 (×5): qty 2

## 2015-10-13 MED ORDER — VANCOMYCIN HCL IN DEXTROSE 1-5 GM/200ML-% IV SOLN
1000.0000 mg | Freq: Once | INTRAVENOUS | Status: AC
  Administered 2015-10-13: 1000 mg via INTRAVENOUS
  Filled 2015-10-13: qty 200

## 2015-10-13 MED ORDER — MORPHINE SULFATE (PF) 2 MG/ML IV SOLN
2.0000 mg | INTRAVENOUS | Status: DC | PRN
  Administered 2015-10-14: 2 mg via INTRAVENOUS
  Administered 2015-10-14 (×4): 4 mg via INTRAVENOUS
  Administered 2015-10-14: 2 mg via INTRAVENOUS
  Administered 2015-10-14: 4 mg via INTRAVENOUS
  Administered 2015-10-15: 2 mg via INTRAVENOUS
  Administered 2015-10-15 – 2015-10-16 (×6): 4 mg via INTRAVENOUS
  Administered 2015-10-16: 2 mg via INTRAVENOUS
  Administered 2015-10-16: 4 mg via INTRAVENOUS
  Administered 2015-10-16 – 2015-10-17 (×2): 2 mg via INTRAVENOUS
  Administered 2015-10-17 (×3): 4 mg via INTRAVENOUS
  Administered 2015-10-17 – 2015-10-18 (×2): 2 mg via INTRAVENOUS
  Administered 2015-10-18 – 2015-10-19 (×2): 4 mg via INTRAVENOUS
  Filled 2015-10-13: qty 1
  Filled 2015-10-13 (×2): qty 2
  Filled 2015-10-13: qty 1
  Filled 2015-10-13 (×2): qty 2
  Filled 2015-10-13: qty 1
  Filled 2015-10-13 (×10): qty 2
  Filled 2015-10-13: qty 1
  Filled 2015-10-13 (×4): qty 2
  Filled 2015-10-13 (×2): qty 1
  Filled 2015-10-13: qty 2
  Filled 2015-10-13: qty 1

## 2015-10-13 MED ORDER — MIDAZOLAM HCL 10 MG/2ML IJ SOLN
INTRAMUSCULAR | Status: AC
  Filled 2015-10-13: qty 2

## 2015-10-13 MED ORDER — SODIUM CHLORIDE 0.9 % IV SOLN: 250.0000 mL | INTRAVENOUS | Status: DC

## 2015-10-13 MED ORDER — SODIUM CHLORIDE 0.9 % IV SOLN
0.5000 g/h | Freq: Once | INTRAVENOUS | Status: DC
  Filled 2015-10-13: qty 20

## 2015-10-13 MED ORDER — ANTITHROMBIN III (HUMAN) 500 UNITS IV SOLR
593.0000 [IU] | INTRAVENOUS | Status: DC
  Filled 2015-10-13: qty 11.9

## 2015-10-13 MED ORDER — BISACODYL 10 MG RE SUPP
10.0000 mg | Freq: Every day | RECTAL | Status: DC
  Administered 2015-10-14 – 2015-10-15 (×2): 10 mg via RECTAL
  Filled 2015-10-13 (×2): qty 1

## 2015-10-13 MED ORDER — LACTATED RINGERS IV SOLN
INTRAVENOUS | Status: DC | PRN
Start: 2015-10-13 — End: 2015-10-13
  Administered 2015-10-13 (×2): via INTRAVENOUS

## 2015-10-13 MED ORDER — MILRINONE IN DEXTROSE 20 MG/100ML IV SOLN
INTRAVENOUS | Status: DC | PRN
  Administered 2015-10-13: .5 ug/kg/min via INTRAVENOUS

## 2015-10-13 MED ORDER — SODIUM CHLORIDE 0.9 % IV SOLN
INTRAVENOUS | Status: DC
  Filled 2015-10-13 (×2): qty 2.5

## 2015-10-13 MED ORDER — MORPHINE SULFATE (PF) 2 MG/ML IV SOLN
1.0000 mg | INTRAVENOUS | Status: AC | PRN
  Administered 2015-10-13 – 2015-10-14 (×3): 2 mg via INTRAVENOUS
  Filled 2015-10-13 (×3): qty 1

## 2015-10-13 MED ORDER — STERILE WATER FOR INJECTION IJ SOLN
INTRAMUSCULAR | Status: AC
  Filled 2015-10-13: qty 10

## 2015-10-13 MED ORDER — ACETAMINOPHEN 160 MG/5ML PO SOLN
1000.0000 mg | Freq: Four times a day (QID) | ORAL | Status: DC
  Administered 2015-10-14 – 2015-10-15 (×4): 1000 mg
  Filled 2015-10-13 (×4): qty 40.6

## 2015-10-13 MED ORDER — THROMBIN 20000 UNITS EX SOLR
CUTANEOUS | Status: DC | PRN
  Administered 2015-10-13: 20000 [IU] via TOPICAL

## 2015-10-13 MED ORDER — PHENYLEPHRINE HCL 10 MG/ML IJ SOLN
0.0000 ug/min | INTRAVENOUS | Status: DC
  Administered 2015-10-14: 70 ug/min via INTRAVENOUS
  Administered 2015-10-14: 40 ug/min via INTRAVENOUS
  Administered 2015-10-15: 100 ug/min via INTRAVENOUS
  Administered 2015-10-15: 75 ug/min via INTRAVENOUS
  Administered 2015-10-15: 100 ug/min via INTRAVENOUS
  Administered 2015-10-15: 70 ug/min via INTRAVENOUS
  Filled 2015-10-13 (×12): qty 2

## 2015-10-13 MED ORDER — SODIUM CHLORIDE 0.45 % IV SOLN
INTRAVENOUS | Status: DC | PRN
  Administered 2015-10-13: 20 mL/h via INTRAVENOUS

## 2015-10-13 MED ORDER — LIDOCAINE HCL (CARDIAC) 20 MG/ML IV SOLN
INTRAVENOUS | Status: AC
  Filled 2015-10-13: qty 5

## 2015-10-13 MED ORDER — DEXMEDETOMIDINE HCL IN NACL 400 MCG/100ML IV SOLN
0.4000 ug/kg/h | INTRAVENOUS | Status: DC
  Administered 2015-10-13: 0.7 ug/kg/h via INTRAVENOUS
  Administered 2015-10-14: 1.4 ug/kg/h via INTRAVENOUS
  Administered 2015-10-14: 1.2 ug/kg/h via INTRAVENOUS
  Administered 2015-10-14 (×2): 0.7 ug/kg/h via INTRAVENOUS
  Administered 2015-10-14 (×2): 1.2 ug/kg/h via INTRAVENOUS
  Administered 2015-10-15: 1 ug/kg/h via INTRAVENOUS
  Administered 2015-10-15: 1.4 ug/kg/h via INTRAVENOUS
  Filled 2015-10-13 (×9): qty 100

## 2015-10-13 MED ORDER — PROTAMINE SULFATE 10 MG/ML IV SOLN
INTRAVENOUS | Status: AC
  Filled 2015-10-13: qty 10

## 2015-10-13 MED ORDER — FENTANYL CITRATE (PF) 100 MCG/2ML IJ SOLN
INTRAMUSCULAR | Status: DC | PRN
  Administered 2015-10-13: 75 ug via INTRAVENOUS
  Administered 2015-10-13: 300 ug via INTRAVENOUS
  Administered 2015-10-13: 200 ug via INTRAVENOUS
  Administered 2015-10-13 (×2): 100 ug via INTRAVENOUS
  Administered 2015-10-13 (×2): 150 ug via INTRAVENOUS
  Administered 2015-10-13: 25 ug via INTRAVENOUS

## 2015-10-13 MED ORDER — NOREPINEPHRINE BITARTRATE 1 MG/ML IV SOLN
0.0000 ug/min | INTRAVENOUS | Status: DC
  Filled 2015-10-13: qty 4

## 2015-10-13 MED ORDER — DOPAMINE-DEXTROSE 3.2-5 MG/ML-% IV SOLN
0.0000 ug/kg/min | INTRAVENOUS | Status: DC
  Administered 2015-10-15 – 2015-10-17 (×3): 3 ug/kg/min via INTRAVENOUS
  Filled 2015-10-13 (×3): qty 250

## 2015-10-13 MED ORDER — METOPROLOL TARTRATE 25 MG/10 ML ORAL SUSPENSION: 12.5000 mg | Freq: Two times a day (BID) | ORAL | Status: DC

## 2015-10-13 MED ORDER — SODIUM CHLORIDE 0.9 % IV SOLN
INTRAVENOUS | Status: DC
  Administered 2015-10-14 (×2): 10 mL via INTRAVENOUS
  Administered 2015-10-15 (×2): via INTRAVENOUS

## 2015-10-13 MED ORDER — MIDAZOLAM HCL 2 MG/2ML IJ SOLN
INTRAMUSCULAR | Status: AC
  Filled 2015-10-13: qty 4

## 2015-10-13 MED ORDER — PANTOPRAZOLE SODIUM 40 MG PO TBEC
40.0000 mg | DELAYED_RELEASE_TABLET | Freq: Every day | ORAL | Status: DC
  Administered 2015-10-16 – 2015-10-20 (×5): 40 mg via ORAL
  Filled 2015-10-13 (×5): qty 1

## 2015-10-13 MED ORDER — ACETAMINOPHEN 500 MG PO TABS
1000.0000 mg | ORAL_TABLET | Freq: Four times a day (QID) | ORAL | Status: DC
  Administered 2015-10-15: 1000 mg via ORAL
  Filled 2015-10-13: qty 2

## 2015-10-13 MED ORDER — ANTISEPTIC ORAL RINSE SOLUTION (CORINZ)
7.0000 mL | Freq: Four times a day (QID) | OROMUCOSAL | Status: DC
  Administered 2015-10-13 – 2015-10-25 (×27): 7 mL via OROMUCOSAL

## 2015-10-13 MED ORDER — BISACODYL 5 MG PO TBEC
10.0000 mg | DELAYED_RELEASE_TABLET | Freq: Every day | ORAL | Status: DC
  Administered 2015-10-16 – 2015-10-17 (×2): 10 mg via ORAL
  Filled 2015-10-13 (×4): qty 2

## 2015-10-13 MED ORDER — SODIUM CHLORIDE 0.9 % IJ SOLN
3.0000 mL | Freq: Two times a day (BID) | INTRAMUSCULAR | Status: DC
  Administered 2015-10-14 – 2015-10-15 (×4): 3 mL via INTRAVENOUS

## 2015-10-13 MED ORDER — SODIUM CHLORIDE 0.9 % IJ SOLN
INTRAMUSCULAR | Status: AC
  Filled 2015-10-13: qty 20

## 2015-10-13 MED ORDER — 0.9 % SODIUM CHLORIDE (POUR BTL) OPTIME
TOPICAL | Status: DC | PRN
  Administered 2015-10-13: 6000 mL

## 2015-10-13 MED ORDER — ASPIRIN EC 325 MG PO TBEC
325.0000 mg | DELAYED_RELEASE_TABLET | Freq: Every day | ORAL | Status: DC
  Administered 2015-10-16 – 2015-10-17 (×2): 325 mg via ORAL
  Filled 2015-10-13 (×2): qty 1

## 2015-10-13 MED ORDER — THROMBIN 20000 UNITS EX SOLR
CUTANEOUS | Status: AC
Start: 2015-10-13 — End: 2015-10-13
  Filled 2015-10-13: qty 20000

## 2015-10-13 MED ORDER — ASPIRIN 81 MG PO CHEW
324.0000 mg | CHEWABLE_TABLET | Freq: Every day | ORAL | Status: DC
  Administered 2015-10-14 – 2015-10-15 (×2): 324 mg
  Filled 2015-10-13 (×2): qty 4

## 2015-10-13 MED ORDER — LACTATED RINGERS IV SOLN
INTRAVENOUS | Status: DC
  Administered 2015-10-13: 20 mL/h via INTRAVENOUS

## 2015-10-13 MED ORDER — POTASSIUM CHLORIDE 10 MEQ/50ML IV SOLN: 10.0000 meq | INTRAVENOUS | Status: AC

## 2015-10-13 MED ORDER — ANTITHROMBIN III (HUMAN) 500 UNITS IV SOLR
593.0000 [IU] | Freq: Once | INTRAVENOUS | Status: AC
  Administered 2015-10-13: 593 [IU] via INTRAVENOUS
  Filled 2015-10-13: qty 11.9

## 2015-10-13 MED ORDER — PHENYLEPHRINE HCL 10 MG/ML IJ SOLN
INTRAMUSCULAR | Status: AC
  Filled 2015-10-13: qty 1

## 2015-10-13 MED ORDER — ARTIFICIAL TEARS OP OINT
TOPICAL_OINTMENT | OPHTHALMIC | Status: DC | PRN
  Administered 2015-10-13: 1 via OPHTHALMIC

## 2015-10-13 MED ORDER — FENTANYL CITRATE (PF) 250 MCG/5ML IJ SOLN
INTRAMUSCULAR | Status: AC
  Filled 2015-10-13: qty 25

## 2015-10-13 MED ORDER — INSULIN ASPART 100 UNIT/ML ~~LOC~~ SOLN
0.0000 [IU] | SUBCUTANEOUS | Status: DC
  Administered 2015-10-14 (×2): 2 [IU] via SUBCUTANEOUS
  Administered 2015-10-14: 4 [IU] via SUBCUTANEOUS
  Administered 2015-10-14: 2 [IU] via SUBCUTANEOUS

## 2015-10-13 MED ORDER — LACTATED RINGERS IV SOLN
INTRAVENOUS | Status: DC
  Administered 2015-10-14: 20 mL via INTRAVENOUS

## 2015-10-13 MED ORDER — LACTATED RINGERS IV SOLN
INTRAVENOUS | Status: DC | PRN
Start: 2015-10-13 — End: 2015-10-13
  Administered 2015-10-13: 07:00:00 via INTRAVENOUS

## 2015-10-13 MED ORDER — HEPARIN SODIUM (PORCINE) 1000 UNIT/ML IJ SOLN
INTRAMUSCULAR | Status: DC | PRN
  Administered 2015-10-13: 35000 [IU] via INTRAVENOUS
  Administered 2015-10-13 (×2): 10000 [IU] via INTRAVENOUS

## 2015-10-13 MED ORDER — SODIUM CHLORIDE 0.9 % IJ SOLN: 3.0000 mL | INTRAMUSCULAR | Status: DC | PRN

## 2015-10-13 MED ORDER — LACTATED RINGERS IV SOLN
INTRAVENOUS | Status: DC | PRN
  Administered 2015-10-13 (×2): via INTRAVENOUS

## 2015-10-13 MED ORDER — PHENYLEPHRINE HCL 10 MG/ML IJ SOLN
0.0000 ug/min | INTRAVENOUS | Status: DC
  Filled 2015-10-13: qty 2

## 2015-10-13 SURGICAL SUPPLY — 137 items
BAG DECANTER FOR FLEXI CONT (MISCELLANEOUS) ×4 IMPLANT
BANDAGE ACE 4X5 VEL STRL LF (GAUZE/BANDAGES/DRESSINGS) ×2 IMPLANT
BANDAGE ACE 6X5 VEL STRL LF (GAUZE/BANDAGES/DRESSINGS) ×2 IMPLANT
BANDAGE ELASTIC 4 VELCRO ST LF (GAUZE/BANDAGES/DRESSINGS) ×4 IMPLANT
BANDAGE ELASTIC 6 VELCRO ST LF (GAUZE/BANDAGES/DRESSINGS) ×4 IMPLANT
BASKET HEART  (ORDER IN 25'S) (MISCELLANEOUS) ×1
BASKET HEART (ORDER IN 25'S) (MISCELLANEOUS) ×1
BASKET HEART (ORDER IN 25S) (MISCELLANEOUS) ×2 IMPLANT
BLADE STERNUM SYSTEM 6 (BLADE) ×4 IMPLANT
BNDG GAUZE ELAST 4 BULKY (GAUZE/BANDAGES/DRESSINGS) ×4 IMPLANT
CANISTER SUCTION 2500CC (MISCELLANEOUS) ×4 IMPLANT
CANN PRFSN 3/8X14X24FR PCFC (MISCELLANEOUS) ×2
CANNULA PRFSN 3/8X14X24FR PCFC (MISCELLANEOUS) IMPLANT
CANNULA SUMP PERICARDIAL (CANNULA) ×2 IMPLANT
CANNULA VEN MTL TIP RT (MISCELLANEOUS) ×4
CANNULA VRC MALB SNGL STG 36FR (MISCELLANEOUS) IMPLANT
CATH ROBINSON RED A/P 18FR (CATHETERS) ×8 IMPLANT
CATH THORACIC 28FR (CATHETERS) ×6 IMPLANT
CATH THORACIC 36FR (CATHETERS) ×4 IMPLANT
CATH THORACIC 36FR RT ANG (CATHETERS) ×4 IMPLANT
CLIP TI MEDIUM 24 (CLIP) IMPLANT
CLIP TI WIDE RED SMALL 24 (CLIP) ×2 IMPLANT
CONN 1/2X1/2X1/2  BEN (MISCELLANEOUS) ×2
CONN 1/2X1/2X1/2 BEN (MISCELLANEOUS) IMPLANT
CONN 3/8X1/2 ST GISH (MISCELLANEOUS) ×2 IMPLANT
CONN ST 1/2X1/2  BEN (MISCELLANEOUS) ×2
CONN ST 1/2X1/2 BEN (MISCELLANEOUS) IMPLANT
CONN ST 1/4X3/8  BEN (MISCELLANEOUS) ×2
CONN ST 1/4X3/8 BEN (MISCELLANEOUS) IMPLANT
CONT SPEC 4OZ CLIKSEAL STRL BL (MISCELLANEOUS) ×2 IMPLANT
COVER SURGICAL LIGHT HANDLE (MISCELLANEOUS) ×4 IMPLANT
CRADLE DONUT ADULT HEAD (MISCELLANEOUS) ×4 IMPLANT
DRAPE CARDIOVASCULAR INCISE (DRAPES) ×4
DRAPE SLUSH/WARMER DISC (DRAPES) ×4 IMPLANT
DRAPE SRG 135X102X78XABS (DRAPES) ×2 IMPLANT
DRSG COVADERM 4X14 (GAUZE/BANDAGES/DRESSINGS) ×4 IMPLANT
ELECT CAUTERY BLADE 6.4 (BLADE) ×4 IMPLANT
ELECT REM PT RETURN 9FT ADLT (ELECTROSURGICAL) ×8
ELECTRODE REM PT RTRN 9FT ADLT (ELECTROSURGICAL) ×4 IMPLANT
GAUZE SPONGE 4X4 12PLY STRL (GAUZE/BANDAGES/DRESSINGS) ×8 IMPLANT
GLOVE BIO SURGEON STRL SZ 6 (GLOVE) ×8 IMPLANT
GLOVE BIO SURGEON STRL SZ 6.5 (GLOVE) ×4 IMPLANT
GLOVE BIO SURGEON STRL SZ7 (GLOVE) IMPLANT
GLOVE BIO SURGEON STRL SZ7.5 (GLOVE) IMPLANT
GLOVE BIO SURGEONS STRL SZ 6.5 (GLOVE) ×4
GLOVE BIOGEL PI IND STRL 6 (GLOVE) IMPLANT
GLOVE BIOGEL PI IND STRL 6.5 (GLOVE) IMPLANT
GLOVE BIOGEL PI IND STRL 7.0 (GLOVE) IMPLANT
GLOVE BIOGEL PI INDICATOR 6 (GLOVE)
GLOVE BIOGEL PI INDICATOR 6.5 (GLOVE)
GLOVE BIOGEL PI INDICATOR 7.0 (GLOVE)
GLOVE EUDERMIC 7 POWDERFREE (GLOVE) ×10 IMPLANT
GLOVE ORTHO TXT STRL SZ7.5 (GLOVE) IMPLANT
GOWN STRL REUS W/ TWL LRG LVL3 (GOWN DISPOSABLE) ×8 IMPLANT
GOWN STRL REUS W/ TWL XL LVL3 (GOWN DISPOSABLE) ×2 IMPLANT
GOWN STRL REUS W/TWL LRG LVL3 (GOWN DISPOSABLE) ×16
GOWN STRL REUS W/TWL XL LVL3 (GOWN DISPOSABLE) ×4
HEMOSTAT POWDER SURGIFOAM 1G (HEMOSTASIS) ×12 IMPLANT
HEMOSTAT SURGICEL 2X14 (HEMOSTASIS) ×4 IMPLANT
INSERT FOGARTY 61MM (MISCELLANEOUS) IMPLANT
INSERT FOGARTY XLG (MISCELLANEOUS) IMPLANT
KIT BASIN OR (CUSTOM PROCEDURE TRAY) ×4 IMPLANT
KIT CATH CPB BARTLE (MISCELLANEOUS) ×4 IMPLANT
KIT ROOM TURNOVER OR (KITS) ×4 IMPLANT
KIT SUCTION CATH 14FR (SUCTIONS) ×4 IMPLANT
KIT VASOVIEW W/TROCAR VH 2000 (KITS) ×4 IMPLANT
LINE VENT (MISCELLANEOUS) ×2 IMPLANT
LOOP VESSEL SUPERMAXI WHITE (MISCELLANEOUS) ×2 IMPLANT
NS IRRIG 1000ML POUR BTL (IV SOLUTION) ×20 IMPLANT
PACK OPEN HEART (CUSTOM PROCEDURE TRAY) ×4 IMPLANT
PAD ARMBOARD 7.5X6 YLW CONV (MISCELLANEOUS) ×8 IMPLANT
PAD ELECT DEFIB RADIOL ZOLL (MISCELLANEOUS) ×4 IMPLANT
PENCIL BUTTON HOLSTER BLD 10FT (ELECTRODE) ×4 IMPLANT
PUNCH AORTIC ROTATE 4.0MM (MISCELLANEOUS) ×2 IMPLANT
PUNCH AORTIC ROTATE 4.5MM 8IN (MISCELLANEOUS) ×4 IMPLANT
PUNCH AORTIC ROTATE 5MM 8IN (MISCELLANEOUS) IMPLANT
SEALANT SURG COSEAL 8ML (VASCULAR PRODUCTS) ×2 IMPLANT
SET CARDIOPLEGIA MPS 5001102 (MISCELLANEOUS) ×2 IMPLANT
SPONGE GAUZE 4X4 12PLY STER LF (GAUZE/BANDAGES/DRESSINGS) ×4 IMPLANT
SPONGE INTESTINAL PEANUT (DISPOSABLE) IMPLANT
SPONGE LAP 18X18 X RAY DECT (DISPOSABLE) ×2 IMPLANT
SPONGE LAP 4X18 X RAY DECT (DISPOSABLE) ×4 IMPLANT
SUT BONE WAX W31G (SUTURE) ×4 IMPLANT
SUT ETHIBOND (SUTURE) ×4 IMPLANT
SUT ETHIBOND 2 0 SH (SUTURE) ×24 IMPLANT
SUT ETHIBOND 2 0 SH 36X2 (SUTURE) IMPLANT
SUT ETHIBOND 2-0 RB-1 WHT (SUTURE) ×4 IMPLANT
SUT MNCRL AB 4-0 PS2 18 (SUTURE) IMPLANT
SUT PROLENE 3 0 SH DA (SUTURE) IMPLANT
SUT PROLENE 3 0 SH1 36 (SUTURE) ×6 IMPLANT
SUT PROLENE 4 0 RB 1 (SUTURE) ×8
SUT PROLENE 4 0 SH DA (SUTURE) IMPLANT
SUT PROLENE 4-0 RB1 .5 CRCL 36 (SUTURE) IMPLANT
SUT PROLENE 5 0 C 1 36 (SUTURE) ×4 IMPLANT
SUT PROLENE 6 0 C 1 30 (SUTURE) ×4 IMPLANT
SUT PROLENE 7 0 BV 1 (SUTURE) IMPLANT
SUT PROLENE 7 0 BV1 MDA (SUTURE) ×6 IMPLANT
SUT PROLENE 8 0 BV175 6 (SUTURE) ×4 IMPLANT
SUT SILK  1 MH (SUTURE) ×8
SUT SILK 1 MH (SUTURE) IMPLANT
SUT SILK 1 TIES 10X30 (SUTURE) ×2 IMPLANT
SUT SILK 2 0 SH CR/8 (SUTURE) ×8 IMPLANT
SUT SILK 2 0 TIES 10X30 (SUTURE) ×2 IMPLANT
SUT SILK 2 0 TIES 17X18 (SUTURE) ×4
SUT SILK 2-0 18XBRD TIE BLK (SUTURE) IMPLANT
SUT SILK 3 0 SH CR/8 (SUTURE) ×2 IMPLANT
SUT SILK 4 0 TIE 10X30 (SUTURE) ×4 IMPLANT
SUT STEEL STERNAL CCS#1 18IN (SUTURE) IMPLANT
SUT STEEL SZ 6 DBL 3X14 BALL (SUTURE) ×6 IMPLANT
SUT TEM PAC WIRE 2 0 SH (SUTURE) ×8 IMPLANT
SUT VIC AB 1 CTX 36 (SUTURE) ×8
SUT VIC AB 1 CTX36XBRD ANBCTR (SUTURE) ×4 IMPLANT
SUT VIC AB 2-0 CT1 27 (SUTURE) ×4
SUT VIC AB 2-0 CT1 TAPERPNT 27 (SUTURE) IMPLANT
SUT VIC AB 2-0 CTX 27 (SUTURE) ×4 IMPLANT
SUT VIC AB 3-0 SH 27 (SUTURE)
SUT VIC AB 3-0 SH 27X BRD (SUTURE) IMPLANT
SUT VIC AB 3-0 X1 27 (SUTURE) ×6 IMPLANT
SUT VICRYL 4-0 PS2 18IN ABS (SUTURE) IMPLANT
SUTURE E-PAK OPEN HEART (SUTURE) ×4 IMPLANT
SYSTEM ANNULOPLASTY MC3 (Orthopedic Implant) ×2 IMPLANT
SYSTEM SAHARA CHEST DRAIN ATS (WOUND CARE) ×4 IMPLANT
TAPE CLOTH SURG 4X10 WHT LF (GAUZE/BANDAGES/DRESSINGS) ×2 IMPLANT
TAPE PAPER 2X10 WHT MICROPORE (GAUZE/BANDAGES/DRESSINGS) ×2 IMPLANT
TOWEL OR 17X24 6PK STRL BLUE (TOWEL DISPOSABLE) ×4 IMPLANT
TOWEL OR 17X26 10 PK STRL BLUE (TOWEL DISPOSABLE) ×4 IMPLANT
TRAY CATH LUMEN 1 20CM STRL (SET/KITS/TRAYS/PACK) ×2 IMPLANT
TRAY FOLEY IC TEMP SENS 16FR (CATHETERS) ×4 IMPLANT
TUBE CONNECTING 12'X1/4 (SUCTIONS) ×1
TUBE CONNECTING 12X1/4 (SUCTIONS) ×1 IMPLANT
TUBING ART PRESS 48 MALE/FEM (TUBING) ×4 IMPLANT
TUBING INSUFFLATION (TUBING) ×4 IMPLANT
UNDERPAD 30X30 INCONTINENT (UNDERPADS AND DIAPERS) ×4 IMPLANT
VALVE MAGNA MITRAL 25MM (Prosthesis & Implant Heart) ×2 IMPLANT
VRC MALLEABLE SINGLE STG 36FR (MISCELLANEOUS) ×4
WATER STERILE IRR 1000ML POUR (IV SOLUTION) ×8 IMPLANT
YANKAUER SUCT BULB TIP NO VENT (SUCTIONS) ×2 IMPLANT

## 2015-10-13 NOTE — Anesthesia Postprocedure Evaluation (Signed)
Anesthesia Post Note  Patient: Jordan [name]  Procedure(s) Performed: Procedure(s) (LRB): CORONARY ARTERY BYPASS GRAFTING (CABG) x four using left internal mammary artery and left leg greater saphenous vein harvested endoscopically (N/A) TRANSESOPHAGEAL ECHOCARDIOGRAM (TEE) (N/A) MITRAL VALVE (MV) REPLACEMENT (N/A) TRICUSPID VALVE REPAIR (N/A)  Patient location during evaluation: SICU Anesthesia Type: General Level of consciousness: patient remains intubated per anesthesia plan Vital Signs Assessment: post-procedure vital signs reviewed and stable Respiratory status: patient remains intubated per anesthesia plan Cardiovascular status: stable Anesthetic complications: no    Last Vitals:  Filed Vitals:   10/13/15 1747 10/13/15 1800  BP: 98/58 94/64  Pulse: 90 89  Temp:  36.2 C  Resp: 14 14    Last Pain:  Filed Vitals:   10/13/15 1805  PainSc: Asleep                 Lewie LoronJohn Yehudah Standing

## 2015-10-13 NOTE — Progress Notes (Signed)
Patient ID: Marcelo BaldyLarry W Hallgren, male   DOB: [birth year], 69 y.o.   MRN: [id number]  SICU Evening Rounds:   Hemodynamically stable on dop 3, milrinone 0.25 and neo 70.  CI = 2.3  Sedated on vent CXR shows pulmonary edema and on 100% O2 so will keep on vent sedated tonight and see how things look tomorrow.  Urine output good  CT output low  CBC    Component Value Date/Time   WBC 15.2* 10/13/2015 1650   RBC 3.57* 10/13/2015 1650   RBC 3.29* 01/16/2012 2214   HGB 10.2* 10/13/2015 1650   HCT 30.8* 10/13/2015 1650   PLT 149* 10/13/2015 1650   MCV 86.3 10/13/2015 1650   MCH 28.6 10/13/2015 1650   MCHC 33.1 10/13/2015 1650   RDW 16.5* 10/13/2015 1650   LYMPHSABS 0.9 08/23/2013 1054   MONOABS 1.0 08/23/2013 1054   EOSABS 0.0 08/23/2013 1054   BASOSABS 0.0 08/23/2013 1054     BMET    Component Value Date/Time   NA 137 10/13/2015 1642   K 3.6 10/13/2015 1642   CL 99* 10/13/2015 1337   CO2 25 10/13/2015 0545   CO2 25 10/13/2015 0545   GLUCOSE 116* 10/13/2015 1642   BUN 37* 10/13/2015 1337   CREATININE 2.20* 10/13/2015 1337   CREATININE 1.96* 11/28/2012 1233   CALCIUM 9.1 10/13/2015 0545   CALCIUM 9.1 10/13/2015 0545   GFRNONAA 25* 10/13/2015 0545   GFRNONAA 25* 10/13/2015 0545   GFRAA 29* 10/13/2015 0545   GFRAA 29* 10/13/2015 0545     A/P:  Stable postop course. Continue current plans. Do not wean drips or vent. .Marland Kitchen

## 2015-10-13 NOTE — Transfer of Care (Signed)
Immediate Anesthesia Transfer of Care Note  Patient: Dola FactorLarry W Kindler  Procedure(s) Performed: Procedure(s): CORONARY ARTERY BYPASS GRAFTING (CABG) x four using left internal mammary artery and left leg greater saphenous vein harvested endoscopically (N/A) TRANSESOPHAGEAL ECHOCARDIOGRAM (TEE) (N/A) MITRAL VALVE (MV) REPLACEMENT (N/A) TRICUSPID VALVE REPAIR (N/A)  Patient Location: SICU  Anesthesia Type:General  Level of Consciousness: Patient remains intubated per anesthesia plan  Airway & Oxygen Therapy: Patient remains intubated per anesthesia plan and Patient placed on Ventilator (see vital sign flow sheet for setting)  Post-op Assessment: Report given to RN and Post -op Vital signs reviewed and stable  Post vital signs: Reviewed and stable  Last Vitals:  Filed Vitals:   10/12/15 2144 10/13/15 0521  BP: 106/79 127/77  Pulse: 82 93  Temp: 36.4 C 36.8 C  Resp: 18 16    Complications: No apparent anesthesia complications

## 2015-10-13 NOTE — Op Note (Signed)
CARDIOVASCULAR SURGERY OPERATIVE NOTE  10/13/2015  Surgeon:  [name], MD  First Assistant: Gershon Crane,  PA-C   Preoperative Diagnosis:     Left main and severe multi-vessel coronary artery disease s/p NSTEMI with pulmonary edema  Severe mitral regurgitation  Severe tricuspid regurgitation   Postoperative Diagnosis:  Same   Procedure:  1. Median Sternotomy 2. Extracorporeal circulation 3.   Coronary artery bypass grafting x 4   Left internal mammary graft to the LAD  SVG to OM  Sequential SVG to distal RCA and PDA  4.   Endoscopic vein harvest from the left leg 5.   Mitral valve replacement using a 25 mm Edwards Magna-Ease pericardial valve 6.   Tricuspid annuloplasty using a 28 mm Edwards MC3 Annuloplasty ring 7.   Insertion of right femoral arterial line.   Operative Findings:   Severe, diffuse multi-vessel coronary disease with fairly good distal targets and good vein. Large LIMA.  Flail segment of P1 and P2 of the mitral valve due to anterolateral papillary muscle infarction and partial rupture.  Tricuspid valve structurally intact with dilated annulus and severe TR likely due to severe MR with severe pulmonary hypertension.  Anesthesia:  General Endotracheal   Clinical History/Surgical Indication:  The patient is a 69 year old gentleman with hypertension, stage III CKD with a baseline creat of 1.8 who presented to the [facility] on 12/18 with worsening weakness, shortness of breath, and back and shoulder pain over the past week or so. In the ER troponin was 5 and CXR showed pulmonary edema. An echo showed an EF of 60-65% with mild to moderate MR although mitral valve was not well seen. The aortic valve leaflets were mildly calcified but there was no stenosis. Cardiac cath today showed severe 3-vessel coronary artery disease with 60% distal LM stenosis. I have  personally reviewed his cath and echo. He has left main and severe 3-vessel coronary disease presenting with NSTEMI and pulmonary edema. He has some mitral regurgitation on echo that I suspect is mild since I do not hear a murmur but this will have to be evaluated in the OR with TEE. I agree that his coronary disease is most amenable to CABG. He also has stage III CKD and creatinine was up to 2.1 on 09/18/2015 and 2.2 on admission. It was 2.54 on the morning of his cath. I discussed the operative procedure with the patient and family including alternatives, benefits and risks; including but not limited to bleeding, blood transfusion, infection, stroke, myocardial infarction, graft failure, heart block requiring a permanent pacemaker, organ dysfunction, and death. Jordan [name] understands and agrees to proceed.   Preparation:  The patient was seen in the preoperative holding area and the correct patient, correct operation were confirmed with the patient after reviewing the medical record and catheterization. The consent was signed by me. Preoperative antibiotics were given. A pulmonary arterial line and radial arterial line were placed by the anesthesia team.  He was very short of breath this am and could not lie flat. His PA pressure was 70/45. The patient was taken back to the operating room and positioned supine on the operating room table. After being placed under general endotracheal anesthesia by the anesthesia team a foley catheter was placed. The neck, chest, abdomen, and both legs were prepped with betadine soap and solution and draped in the usual sterile manner. A surgical time-out was taken and the correct patient and operative procedure were confirmed with the nursing and anesthesia  staff.  TEE: Performed by Dr. Renold Don and reviewed by Dr. Maple Hudson.  This showed good LV function. There was severe MR due to a flail segment of the posterior leaflet. There was also severe central TR with an  otherwise normal appearing tricuspid valve. RV function was normal. The aortic valve had no stenosis or regurgitation.   Cardiopulmonary Bypass:  A median sternotomy was performed. The pericardium was opened in the midline. Right ventricular function appeared normal. The ascending aorta was of normal size and had no palpable plaque. There were no contraindications to aortic cannulation or cross-clamping. The patient was fully systemically heparinized and the ACT was maintained > 400 sec. The proximal aortic arch was cannulated with a 20 F aortic cannula for arterial inflow. Bicaval-venous cannulation was performed using a 24 F metal tipped right angle cannula in the SVC and a 36 F malleable cannula in the IVC. An antegrade cardioplegia/vent cannula was inserted into the mid-ascending aorta. A retrograde cardioplegia cannula was inserted into the coronary sinus via the right atrium. Aortic occlusion was performed with a single cross-clamp. Systemic cooling to 28 degrees Centigrade and topical cooling of the heart with iced saline were used. Hyperkalemic antegrade cold blood cardioplegia was used to induce diastolic arrest and was then given at about 20 minute intervals throughout the period of arrest to maintain myocardial temperature at 15 degrees centigrade which was as cold as it would get. Retrograde cardioplegia was given during the mitral valve replacement. A temperature probe was inserted into the interventricular septum and an insulating pad was placed in the pericardium.   Left internal mammary harvest:  The left side of the sternum was retracted using the Rultract retractor. The left internal mammary artery was harvested as a pedicle graft. All side branches were clipped. It was a large-sized vessel of good quality with excellent blood flow. It was ligated distally and divided. It was sprayed with topical papaverine solution to prevent vasospasm.   Endoscopic vein harvest:  The left greater  saphenous vein was harvested endoscopically through a 2 cm incision medial to the left knee. It was harvested from the upper thigh to below the knee. It was a medium-sized vein of good quality. The side branches were all ligated with 4-0 silk ties. We initially looked at the right GSV adjacent to the knee but it was small.   Coronary arteries:  The coronary arteries were examined.   LAD:  Diffusely diseased throughout the proximal and mid portions but distally had no disease. Diagonals were tiny.  LCX:  There was a large OM branch that I did not really see on the cath but may have had faint collaterals.  RCA:  The RCA was diffusely diseased and this extended out into the proximal PDA. Just distal to the PDA the RCA was soft with no significant disease. The mid and distal PDA were free of disease.    Grafts:  1. LIMA to the LAD: 2.0 mm. It was sewn end to side using 8-0 prolene continuous suture. 2. SVG to OM:  2.0 mm. It was sewn end to side using 7-0 prolene continuous suture. 3. Sequential SVG to distal RCA:  2.0 mm. It was sewn sequential side to side using 7-0 prolene continuous suture. 4. Sequential SVG to PDA:  1.6 mm. It was sewn sequential end to side using 7-0 prolene continuous suture.  The proximal vein graft anastomoses were performed to the mid-ascending aorta using continuous 6-0 prolene suture. Graft markers were placed around the  proximal anastomoses.  Mitral Valve Replacement:  The left atrium was opened through a vertical incision in the interatrial groove. Exposure was good. Valve inspection showed a flail segment involving part of P1 and P2. There were multiple flail cords all attached to a head of the anterolateral papillary muscle which looked infarcted with one of two heads torn off. Since the entire anterolateral papillary muscle looked infarcted I felt that it would be best to replace the valve. The anterior leaflet was split and the two halves were tacked to the  mitral annulus at the trigones using the valve sutures to preserve the subvalvular apparatus. The posterior leaflet was also spared and rolled up into the posterior annulus using the valve sutures. A series of pledgetted 2-0 Ethibond sutures were placed around the mitral annulus. The annulus was sized and a 25 mm [employer] Magna-Ease was chosen [employer]( Model # 7300 [place], [place] [id number]). The sutures were placed through the valve and it was lowered into place. The sutures were tied. The atrium was closed with 2 layers of continuous 3-0 prolene suture.  Tricuspid Valve Annuloplasty:  Tapes were placed around the SVC and IVC.  The right atrium was opened obliquely. A Koros retractor was used to expose the valve and exposure was excellent. The tricuspid valve appeared structurally intact with some annular dilation. The anterior leaflet was sized and the length of the septal leaflet measured and a 25mm Edwards MC3 ring was chosen. This had model number 4900 and serial number [id number]. Then  2-0 Ethibond sutures were placed around the annulus. The sutures were placed through the ring and the ring lowered into place. The sutures were tied and the ring seated nicely. I looked for a PFO but found none.  The valve was tested with saline and was completely competent. The atrium was closed in two layers with 4-0 prolene suture.    Completion:  The patient was rewarmed to 37 degrees Centigrade. The crossclamp was removed with a time of 240 minutes. There was spontaneous return of slow junctional rhythm. Two temporary epicardial pacing wires were placed on the right atrium and two on the right ventricle. The radial arterial line tracing was dampened and could not be improved so I inserted a right femoral arterial line using Seldinger technique without difficulty.  The patient was weaned from CPB without difficulty on dopamine  2 and milrinone 0.375. CPB time was 274 minutes. TEE showed a normally functioning tricuspid valve with no  regurgitation and a mean gradient of 1 mm Hg. The prosthetic mitral valve was functioning normally with a mean gradient of 2 mm Hg and no paravalvular leak. LV and RV function was preserved. The aortic valve was functioning normally.  Heparin was fully reversed with protamine and the aortic and venous cannulas removed. Hemostasis was achieved. Mediastinal and bilateral pleural drainage tubes was placed. The sternum was closed withdouble #6 stainless steel wires. The fascia was closed with continuous # 1 vicryl suture. The subcutaneous tissue was closed with 2-0 vicryl continuous suture. The skin was closed with 3-0 vicryl subcuticular suture. All sponge, needle, and instrument counts were reported correct at the end of the case. Dry sterile dressings were placed over the incisions and around the chest tubes which were connected to pleurevac suction. The patient was then transported to the surgical intensive care unit in critical but stable condition.

## 2015-10-13 NOTE — Brief Op Note (Signed)
10/08/2015 - 10/13/2015      [address]       [place] [id number]             [phone]     10/08/2015 - 10/13/2015  2:03 PM  PATIENT:  Jordan [name]  69 y.o. male  PRE-OPERATIVE DIAGNOSIS:  CAD  POST-OPERATIVE DIAGNOSIS:  CAD  PROCEDURE:  Procedure(s): CORONARY ARTERY BYPASS GRAFTING (CABG)x4 LIMA-LAD; SEQ SVG-PD-PL; SVG-OM TRANSESOPHAGEAL ECHOCARDIOGRAM (TEE) MITRAL VALVE (MV) REPLACEMENT TRICUSPID RING ANNULOPLASTY  SURGEON:  Surgeon(s): Alleen BorneBryan K Bartle, MD  PHYSICIAN ASSISTANT: GOLD PA-C  ANESTHESIA:   general  PATIENT CONDITION:  ICU - intubated and hemodynamically stable.  PRE-OPERATIVE WEIGHT: 95kg  Mitral/Tricuspid/Pulmonary Valve Procedure  Mitral Valve Procedure Performed:  REPLACEMENT  Implant: Bioprosthetic Valve: Implant model number 7300TFX, Size 25, Unique Device Identifier [id number].  Tricuspid Valve Procedure Performed:  Annuloplasty only.  Implant: Annuloplasty Device; Implant Model Number 4900, Size 28, Unique Device Identifier [id number]     Mitral Valve Etiology  MV Insufficiency: Severe  MV Disease: Yes.  MV Stenosis: No mitral valve stenosis.  MV Disease Functional Class: MV Disease Functional Class: Type II.  Etiology (Choose at least one and up to five): Ischemic - acute, post infarction.   MV Lesions (Choose at least one): Papillary muscle rupture.

## 2015-10-13 NOTE — Progress Notes (Signed)
  Echocardiogram Echocardiogram Transesophageal has been performed.  Leta JunglingCooper, Avia Merkley M 10/13/2015, 8:16 AM

## 2015-10-13 NOTE — Anesthesia Procedure Notes (Addendum)
Procedure Name: Intubation Date/Time: 10/13/2015 7:52 AM Performed by: Carmela RimaMARTINELLI, Margreat Widener F Pre-anesthesia Checklist: Patient identified, Patient being monitored, Suction available, Timeout performed and Emergency Drugs available Patient Re-evaluated:Patient Re-evaluated prior to inductionOxygen Delivery Method: Circle system utilized Preoxygenation: Pre-oxygenation with 100% oxygen Intubation Type: IV induction Ventilation: Mask ventilation without difficulty Laryngoscope Size: Mac and 3 Grade View: Grade I Tube type: Oral Tube size: 8.0 mm Number of attempts: 1 Placement Confirmation: positive ETCO2,  ETT inserted through vocal cords under direct vision and breath sounds checked- equal and bilateral (On DL blood noted in posterior oropharynx prior to laryngascope placement ) Secured at: 23 cm Tube secured with: Tape Dental Injury: Teeth and Oropharynx as per pre-operative assessment    Central Venous Catheter Insertion Performed by: anesthesiologist Patient location: Pre-op. Preanesthetic checklist: patient identified, IV checked, site marked, risks and benefits discussed, surgical consent, monitors and equipment checked, pre-op evaluation, timeout performed and anesthesia consent Lidocaine 1% used for infiltration Landmarks identified Catheter size: 9 Fr Central line was placed.MAC introducer Swan type and PA catheter depth:42PA Cath depth:42 Procedure performed using ultrasound guided technique. Attempts: 1 Following insertion, line sutured and dressing applied. Post procedure assessment: blood return through all ports, free fluid flow and no air. Patient tolerated the procedure well with no immediate complications.    Central Venous Catheter Insertion Performed by: anesthesiologist Patient location: Pre-op. Preanesthetic checklist: patient identified, IV checked, site marked, risks and benefits discussed, surgical consent, monitors and equipment checked, pre-op evaluation,  timeout performed and anesthesia consent Position: supine Landmarks identified PA cath was placed.Swan type and PA catheter depth:thermodilationProcedure performed using ultrasound guided technique. Attempts: 1 Patient tolerated the procedure well with no immediate complications.

## 2015-10-13 NOTE — OR Nursing (Signed)
1st call @1500  to 2S with a patient update (made aware of R femoral A-line) 2nd call @1513  to 2S with a patient update 3rd call @1537  to 2S with a patient update 4th call @1612 , confirming plans for transport to 2S Room 7

## 2015-10-14 ENCOUNTER — Inpatient Hospital Stay (HOSPITAL_COMMUNITY): Payer: PPO

## 2015-10-14 ENCOUNTER — Inpatient Hospital Stay (HOSPITAL_COMMUNITY): Payer: PPO | Admitting: Anesthesiology

## 2015-10-14 DIAGNOSIS — Z951 Presence of aortocoronary bypass graft: Secondary | ICD-10-CM | POA: Insufficient documentation

## 2015-10-14 DIAGNOSIS — I249 Acute ischemic heart disease, unspecified: Secondary | ICD-10-CM

## 2015-10-14 DIAGNOSIS — I2510 Atherosclerotic heart disease of native coronary artery without angina pectoris: Secondary | ICD-10-CM | POA: Insufficient documentation

## 2015-10-14 LAB — GLUCOSE, CAPILLARY
GLUCOSE-CAPILLARY: 101 mg/dL — AB (ref 65–99)
GLUCOSE-CAPILLARY: 111 mg/dL — AB (ref 65–99)
GLUCOSE-CAPILLARY: 124 mg/dL — AB (ref 65–99)
GLUCOSE-CAPILLARY: 170 mg/dL — AB (ref 65–99)
Glucose-Capillary: 79 mg/dL (ref 65–99)
Glucose-Capillary: 84 mg/dL (ref 65–99)
Glucose-Capillary: 81 mg/dL (ref 65–99)
Glucose-Capillary: 124 mg/dL — ABNORMAL HIGH (ref 65–99)
Glucose-Capillary: 121 mg/dL — ABNORMAL HIGH (ref 65–99)
Glucose-Capillary: 119 mg/dL — ABNORMAL HIGH (ref 65–99)

## 2015-10-14 LAB — BASIC METABOLIC PANEL
Anion gap: 9 (ref 5–15)
Anion gap: 8 (ref 5–15)
BUN: 26 mg/dL — AB (ref 6–20)
BUN: 27 mg/dL — AB (ref 6–20)
CALCIUM: 8 mg/dL — AB (ref 8.9–10.3)
CHLORIDE: 106 mmol/L (ref 101–111)
CHLORIDE: 105 mmol/L (ref 101–111)
CO2: 23 mmol/L (ref 22–32)
CO2: 25 mmol/L (ref 22–32)
CREATININE: 2.13 mg/dL — AB (ref 0.61–1.24)
CREATININE: 2.25 mg/dL — AB (ref 0.61–1.24)
Calcium: 7.8 mg/dL — ABNORMAL LOW (ref 8.9–10.3)
GFR calc Af Amer: 34 mL/min — ABNORMAL LOW (ref >60)
GFR calc non Af Amer: 30 mL/min — ABNORMAL LOW (ref >60)
GFR calc non Af Amer: 28 mL/min — ABNORMAL LOW (ref >60)
GFR, EST AFRICAN AMERICAN: 32 mL/min — AB (ref >60)
GLUCOSE: 133 mg/dL — AB (ref 65–99)
GLUCOSE: 138 mg/dL — AB (ref 65–99)
Potassium: 4.2 mmol/L (ref 3.5–5.1)
Potassium: 3.5 mmol/L (ref 3.5–5.1)
SODIUM: 138 mmol/L (ref 135–145)
Sodium: 138 mmol/L (ref 135–145)

## 2015-10-14 LAB — POCT I-STAT, CHEM 8
BUN: 25 mg/dL — AB (ref 6–20)
CHLORIDE: 100 mmol/L — AB (ref 101–111)
CREATININE: 2.1 mg/dL — AB (ref 0.61–1.24)
Calcium, Ion: 1.13 mmol/L (ref 1.13–1.30)
GLUCOSE: 134 mg/dL — AB (ref 65–99)
HEMATOCRIT: 26 % — AB (ref 39.0–52.0)
HEMOGLOBIN: 8.8 g/dL — AB (ref 13.0–17.0)
POTASSIUM: 3.5 mmol/L (ref 3.5–5.1)
Sodium: 139 mmol/L (ref 135–145)
TCO2: 23 mmol/L (ref 0–100)

## 2015-10-14 LAB — CBC
HCT: 26.9 % — ABNORMAL LOW (ref 39.0–52.0)
HEMATOCRIT: 26.1 % — AB (ref 39.0–52.0)
Hemoglobin: 8.9 g/dL — ABNORMAL LOW (ref 13.0–17.0)
Hemoglobin: 8.8 g/dL — ABNORMAL LOW (ref 13.0–17.0)
MCH: 28.3 pg (ref 26.0–34.0)
MCH: 28.9 pg (ref 26.0–34.0)
MCHC: 33.1 g/dL (ref 30.0–36.0)
MCHC: 33.7 g/dL (ref 30.0–36.0)
MCV: 85.7 fL (ref 78.0–100.0)
MCV: 85.6 fL (ref 78.0–100.0)
PLATELETS: 157 10*3/uL (ref 150–400)
Platelets: 167 10*3/uL (ref 150–400)
RBC: 3.14 MIL/uL — ABNORMAL LOW (ref 4.22–5.81)
RBC: 3.05 MIL/uL — ABNORMAL LOW (ref 4.22–5.81)
RDW: 16.5 % — ABNORMAL HIGH (ref 11.5–15.5)
RDW: 16.9 % — AB (ref 11.5–15.5)
WBC: 12.6 10*3/uL — ABNORMAL HIGH (ref 4.0–10.5)
WBC: 15.5 10*3/uL — ABNORMAL HIGH (ref 4.0–10.5)

## 2015-10-14 LAB — POCT I-STAT 3, ART BLOOD GAS (G3+)
Acid-base deficit: 3 mmol/L — ABNORMAL HIGH (ref 0.0–2.0)
Bicarbonate: 21.5 mEq/L (ref 20.0–24.0)
O2 Saturation: 97 %
TCO2: 23 mmol/L (ref 0–100)
pCO2 arterial: 34.5 mmHg — ABNORMAL LOW (ref 35.0–45.0)
pH, Arterial: 7.406 (ref 7.350–7.450)
pO2, Arterial: 96 mmHg (ref 80.0–100.0)

## 2015-10-14 LAB — HEMOGLOBIN A1C
HEMOGLOBIN A1C: 5.6 % (ref 4.8–5.6)
MEAN PLASMA GLUCOSE: 114 mg/dL

## 2015-10-14 LAB — MAGNESIUM
MAGNESIUM: 2 mg/dL (ref 1.7–2.4)
MAGNESIUM: 1.9 mg/dL (ref 1.7–2.4)

## 2015-10-14 MED ORDER — PROPOFOL 10 MG/ML IV BOLUS: INTRAVENOUS | Status: DC | PRN

## 2015-10-14 MED ORDER — FUROSEMIDE 10 MG/ML IJ SOLN
10.0000 mg/h | INTRAVENOUS | Status: DC
  Administered 2015-10-14 – 2015-10-15 (×2): 10 mg/h via INTRAVENOUS
  Filled 2015-10-14 (×3): qty 25

## 2015-10-14 MED ORDER — PROPOFOL 10 MG/ML IV BOLUS
INTRAVENOUS | Status: DC | PRN
  Administered 2015-10-14 (×2): 40 mg via INTRAVENOUS

## 2015-10-14 MED ORDER — POTASSIUM CHLORIDE 10 MEQ/50ML IV SOLN
10.0000 meq | INTRAVENOUS | Status: AC
  Administered 2015-10-14 (×3): 10 meq via INTRAVENOUS

## 2015-10-14 MED ORDER — LORAZEPAM 2 MG/ML IJ SOLN
1.0000 mg | INTRAMUSCULAR | Status: DC | PRN
  Administered 2015-10-14 – 2015-10-15 (×4): 1 mg via INTRAVENOUS
  Filled 2015-10-14 (×5): qty 1

## 2015-10-14 MED ORDER — PIPERACILLIN-TAZOBACTAM 3.375 G IVPB
3.3750 g | Freq: Three times a day (TID) | INTRAVENOUS | Status: DC
  Administered 2015-10-14 – 2015-10-20 (×17): 3.375 g via INTRAVENOUS
  Filled 2015-10-14 (×19): qty 50

## 2015-10-14 NOTE — Progress Notes (Signed)
Pt pulled ETT out above vocal cords, pushed tube back in 2cm. XRAY called.

## 2015-10-14 NOTE — Significant Event (Signed)
Pt. Bit through pilot balloon.  Notified Dr. Laneta SimmersBartle, instructed to have anesthesia switch tube out.  VSS patient in no acute distress.

## 2015-10-14 NOTE — Progress Notes (Signed)
ANTIBIOTIC CONSULT NOTE - INITIAL  Pharmacy Consult for Zosyn Indication: aspiration pneumonia  No Known Allergies  Patient Measurements: Height:  (167.6 cm) Weight: 229 lb 8 oz (104.1 kg) IBW/kg (Calculated) : 63.8 Adjusted Body Weight: n/a  Vital Signs: Temp: 101.1 F (38.4 C) (12/24 1815) Temp Source: Core (Comment) (12/24 1200) BP: 101/63 mmHg (12/24 1800) Pulse Rate: 89 (12/24 1815) Intake/Output from previous day: 12/23 0701 - 12/24 0700 In: 1610 [I.V.:6634; Blood:500; NG/GT:60; IV Piggyback:1775] Out: 4980 [Urine:1900; Emesis/NG output:50; Blood:2300; Chest Tube:730] Intake/Output from this shift:    Labs:  Recent Labs  10/13/15 2202 10/14/15 0413 10/14/15 1620 10/14/15 1624  WBC 17.2* 12.6* 15.5*  --   HGB 10.1* 8.9* 8.8* 8.8*  PLT 190 157 167  --   CREATININE 2.22* 2.13* 2.25* 2.10*   Estimated Creatinine Clearance: 36.5 mL/min (by C-G formula based on Cr of 2.1). No results for input(s): VANCOTROUGH, VANCOPEAK, VANCORANDOM, GENTTROUGH, GENTPEAK, GENTRANDOM, TOBRATROUGH, TOBRAPEAK, TOBRARND, AMIKACINPEAK, AMIKACINTROU, AMIKACIN in the last 72 hours.   Microbiology: Recent Results (from the past 720 hour(s))  Surgical pcr screen     Status: None   Collection Time: 10/11/15  5:13 AM  Result Value Ref Range Status   MRSA, PCR NEGATIVE NEGATIVE Final   Staphylococcus aureus NEGATIVE NEGATIVE Final    Comment:        The Xpert SA Assay (FDA approved for NASAL specimens in patients over 66 years of age), is one component of a comprehensive surveillance program.  Test performance has been validated by [facility] for patients greater than or equal to 52 year old. It is not intended to diagnose infection nor to guide or monitor treatment.     Medical History: Past Medical History  Diagnosis Date  . Hypertension   . Kidney stone   . Bipolar 1 disorder (HCC)   . Diverticulitis   . Anxiety   . Arthritis   . Sleep apnea   . Chronic headaches    . Stage III chronic kidney disease     Baseline creatinine 1.8  . Left inguinal hernia   . Atherosclerosis   . CAD (coronary artery disease)     a. 10/11/2015: total occlusion of OM1 and LCx, 50% stenosis in distal LM, 80% stenosis in LAD, 99% stenosis in RCA --> CABG recommended.  . Hiatal hernia   . 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 Antritis (stomach)     Medications:  Scheduled:  . acetaminophen  1,000 mg Oral 4 times per day   Or  . acetaminophen (TYLENOL) oral liquid 160 mg/5 mL  1,000 mg Per Tube 4 times per day  . antiseptic oral rinse  7 mL Mouth Rinse QID  . aspirin EC  325 mg Oral Daily   Or  . aspirin  324 mg Per Tube Daily  . bisacodyl  10 mg Oral Daily   Or  . bisacodyl  10 mg Rectal Daily  . cefUROXime (ZINACEF)  IV  1.5 g Intravenous Q12H  . chlorhexidine gluconate  15 mL Mouth Rinse BID  . docusate sodium  200 mg Oral Daily  . insulin aspart  0-24 Units Subcutaneous 6 times per day  . magnesium sulfate  4 g Intravenous Once  . [START ON 10/15/2015] pantoprazole  40 mg Oral Daily  . piperacillin-tazobactam (ZOSYN)  IV  3.375 g Intravenous 3 times per day  . potassium chloride  10 mEq Intravenous Q1 Hr x 3  . simvastatin  20 mg Oral Daily  .  sodium chloride  3 mL Intravenous Q12H   Assessment: 69 yo male s/p CABG 12/23.  Now with temp up to 101.1, pharmacy asked to begin empiric antibiotic coverage with Zosyn.  Scr 2.1, est CrCl ~ 36 ml/min.  Goal of Therapy:  Resolution of infection  Plan:  1. Zosyn 3.375g IV q 8 hrs (4 hr extended interval infusion). 2. F/u cultures, renal function and care plan.  Tad MooreJessica Dontarius Sheley, Pharm D, BCPS  Clinical Pharmacist Pager [phone]  10/14/2015 7:06 PM

## 2015-10-14 NOTE — Progress Notes (Signed)
1 Day Post-Op Procedure(s) (LRB): CORONARY ARTERY BYPASS GRAFTING (CABG) x four using left internal mammary artery and left leg greater saphenous vein harvested endoscopically (N/A) TRANSESOPHAGEAL ECHOCARDIOGRAM (TEE) (N/A) MITRAL VALVE (MV) REPLACEMENT (N/A) TRICUSPID VALVE REPAIR (N/A) Subjective:  He is sedated on Precedex on the vent but responds and follows commands  Objective: Vital signs in last 24 hours: Temp:  [96.8 F (36 C)-100.4 F (38 C)] 100.4 F (38 C) (12/24 0700) Pulse Rate:  [88-90] 90 (12/24 0713) Cardiac Rhythm:  [-] Atrial paced (12/24 0600) Resp:  [14-33] 25 (12/24 0713) BP: (86-104)/(54-73) 95/55 mmHg (12/24 0713) SpO2:  [92 %-99 %] 95 % (12/24 0713) Arterial Line BP: (78-113)/(51-71) 97/56 mmHg (12/24 0700) FiO2 (%):  [80 %-100 %] 80 % (12/24 0713) Weight:  [104.1 kg (229 lb 8 oz)] 104.1 kg (229 lb 8 oz) (12/24 0500)  Hemodynamic parameters for last 24 hours: PAP: (30-51)/(14-32) 37/22 mmHg CO:  [4.3 L/min-5.6 L/min] 5 L/min CI:  [2.1 L/min/m2-2.8 L/min/m2] 2.4 L/min/m2  Intake/Output from previous day: 12/23 0701 - 12/24 0700 In: 8893.3 [I.V.:6558.3; Blood:500; NG/GT:60; IV Piggyback:1775] Out: 4980 [Urine:1900; Emesis/NG output:50; Blood:2300; Chest Tube:730] Intake/Output this shift:    Neurologic: intact Heart: regular rate and rhythm, S1, S2 normal, no murmur, click, rub or gallop Lungs: slightly coarse Abdomen: soft, hypoactive bowel sounds Extremities: edema mild Wound: chest dressing with some old bloody drainage.  Lab Results:  Recent Labs  10/13/15 2202 10/14/15 0413  WBC 17.2* 12.6*  HGB 10.1* 8.9*  HCT 31.5* 26.9*  PLT 190 157   BMET:  Recent Labs  10/13/15 0545  10/13/15 2159 10/13/15 2202 10/14/15 0413  NA 140  140  < > 136  --  138  K 3.6  3.6  < > 4.0  --  4.2  CL 103  103  < > 101  --  106  CO2 25  25  --   --   --  23  GLUCOSE 101*  105*  < > 143*  --  133*  BUN 35*  35*  < > 28*  --  26*  CREATININE  2.41*  2.44*  < > 2.00* 2.22* 2.13*  CALCIUM 9.1  9.1  --   --   --  7.8*  < > = values in this interval not displayed.  PT/INR:  Recent Labs  10/13/15 1650  LABPROT 19.4*  INR 1.63*   ABG    Component Value Date/Time   PHART 7.406 10/14/2015 0405   HCO3 21.5 10/14/2015 0405   TCO2 23 10/14/2015 0405   ACIDBASEDEF 3.0* 10/14/2015 0405   O2SAT 97.0 10/14/2015 0405   CBG (last 3)   Recent Labs  10/13/15 2029 10/14/15 0011 10/14/15 0403  GLUCAP 91 170* 124*   CXR: bilateral edema  Assessment/Plan: S/P Procedure(s) (LRB): CORONARY ARTERY BYPASS GRAFTING (CABG) x four using left internal mammary artery and left leg greater saphenous vein harvested endoscopically (N/A) TRANSESOPHAGEAL ECHOCARDIOGRAM (TEE) (N/A) MITRAL VALVE (MV) REPLACEMENT (N/A) TRICUSPID VALVE REPAIR (N/A)  He is hemodynamically stable on dop 3, milrinone 0.25 and neo 35 mcg. Cardiac index good. Will continue current level of support today.  He is still on 100% FiO2 and did not tolerate weaning it at all overnight. 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 Will keep on vent today and try to diurese.  Stage 3 chronic kidney disease: his creat is  stable at 2 early postop. Will start lasix drip today. Nephrology is following him.  DM: preop Hgb A1c 5.6. Will continue SSI  Preop PAF with RVR probably related to MR. He was on amio preop but stopped in the OR. Will hold off as long as he is not back in A-fib to minimize risk of pulmonary toxicity with marked pulmonary edema. He is in sinus 64 with nonspecific changes.  Will wait to start coumadin for mitral valve.  SCD's for DVT prophylaxis.       LOS: 6 days    Alleen BorneBryan K Bartle 10/14/2015

## 2015-10-14 NOTE — Progress Notes (Signed)
Subjective:  Patient is now POD #1 CABG- BP lowish on pressors- good UOP but seems overloaded/hypoxic- a lot more in than out- labs stable Objective Vital signs in last 24 hours: Filed Vitals:   10/14/15 0615 10/14/15 0630 10/14/15 0645 10/14/15 0700  BP:    93/62  Pulse: 89 89 89 89  Temp: 100.2 F (37.9 C) 100.2 F (37.9 C) 100.4 F (38 C) 100.4 F (38 C)  TempSrc:      Resp: 25 25 25 27   Height:      Weight:      SpO2: 97% 96% 97% 97%   Weight change: 8.346 kg (18 lb 6.4 oz)  Intake/Output Summary (Last 24 hours) at 10/14/15 [id number] Last data filed at 10/14/15 0600  Gross per 24 hour  Intake 8893.31 ml  Output   4980 ml  Net 3913.31 ml    Assessment/ Plan: Pt is a 69 y.o. yo male with CKD who was admitted on 10/08/2015 with now need for CABG  Assessment/Plan: 1. CAD- POD #1 CABG- per CTS- only thing not routine is continued need for vent- I agree needs diuresis 2. CKD- stage 3 at baseline- I think as tuned up as possible pre op and was aware of possible risks to kidney function in this setting-  The fact that creatinine is stable and is having reasonable UOP are both good signs.  He may need inc dose of lasix but will see 3. Anemia- blood loss from surgery- transfuse as needed 4. HTN/volume- much more in than out-  apparently looked overloaded pre op- agree with diuresis- IVFs have been minimized -start lasix  at drip 10 per hour- he may need more of a dose given his CKD- will follow for affect later today and bump as needed with efforts to not drop BP too much   Kalyani Maeda A    Labs: Basic Metabolic Panel:  Recent Labs Lab 10/11/15 0422 10/12/15 0506 10/13/15 0545  10/13/15 1337 10/13/15 1642 10/13/15 2159 10/13/15 2202 10/14/15 0413  NA 142 138 140  140  < > 133* 137 136  --  138  K 3.7 3.6 3.6  3.6  < > 4.1 3.6 4.0  --  4.2  CL 104 101 103  103  < > 99*  --  101  --  106  CO2 26 23 25  25   --   --   --   --   --  23  GLUCOSE 113* 109* 101*   105*  < > 219* 116* 143*  --  133*  BUN 42* 36* 35*  35*  < > 37*  --  28*  --  26*  CREATININE 2.54* 2.36* 2.41*  2.44*  < > 2.20*  --  2.00* 2.22* 2.13*  CALCIUM 8.7* 8.8* 9.1  9.1  --   --   --   --   --  7.8*  PHOS 3.3  --  3.8  --   --   --   --   --   --   < > = values in this interval not displayed. Liver Function Tests:  Recent Labs Lab 10/09/15 0519 10/13/15 0545  AST 41  --   ALT 48  --   ALKPHOS 123  --   BILITOT 1.0  --   PROT 6.6  --   ALBUMIN 3.6 3.1*   No results for input(s): LIPASE, AMYLASE in the last 168 hours. No results for input(s): AMMONIA in the last 168  hours. CBC:  Recent Labs Lab 10/12/15 0506 10/13/15 0545  10/13/15 1650 10/13/15 2159 10/13/15 2202 10/14/15 0413  WBC 11.6* 9.5  --  15.2*  --  17.2* 12.6*  HGB 14.9 14.2  < > 10.2* 10.9* 10.1* 8.9*  HCT 46.2 45.1  < > 30.8* 32.0* 31.5* 26.9*  MCV 88.7 88.1  --  86.3  --  86.1 85.7  PLT 294 271  < > 149*  --  190 157  < > = values in this interval not displayed. Cardiac Enzymes:  Recent Labs Lab 10/09/15 1032 10/09/15 1433 10/09/15 2054  TROPONINI 8.56* 7.29* 7.01*   CBG:  Recent Labs Lab 10/13/15 1906 10/13/15 2010 10/13/15 2029 10/14/15 0011 10/14/15 0403  GLUCAP 81 101* 91 170* 124*    Iron Studies: No results for input(s): IRON, TIBC, TRANSFERRIN, FERRITIN in the last 72 hours. Studies/Results: Dg Chest Port 1 View  10/13/2015  CLINICAL DATA:  Status post CABG day 1 EXAM: PORTABLE CHEST 1 VIEW COMPARISON:  Preoperative PA and lateral chest x-ray of October 10, 2015 FINDINGS: The lungs are mildly hypo inflow would inflated. Confluent alveolar opacities are present bilaterally. The hemidiaphragms are sharp. There is no pneumothorax or significant pleural effusion. The cardiac silhouette is top-normal in size. The pulmonary vascularity is engorged and indistinct. The endotracheal tube tip lies approximately 2.9 cm above the carina. The esophagogastric tube tip projects in the  gastric cardia but the proximal port is likely below the GE junction. A mediastinal drain projects to the right of the thoracic spine at approximately the T4 level. A second mediastinal drain lies horizontally at the level of the left hemidiaphragm. The right chest tube tip projects at the level of the posterior aspect of the fourth rib. The left chest tube projects between the fourth and fifth posterior ribs. The Swan-Ganz catheter tip projects in the proximal right main pulmonary artery. A prosthetic valve ring is present possibly in the tricuspid position. There are 7 intact sternal wires. IMPRESSION: 1. Pulmonary alveolar edema greater on the left than on the right. No pleural effusion or pneumothorax. 2. The support tubes are in reasonable position. Electronically Signed   By: David  [place] M.D.   On: 10/13/2015 16:47   Medications: Infusions: . sodium chloride 20 mL/hr at 10/14/15 0015  . sodium chloride    . sodium chloride 20 mL/hr at 10/13/15 1900  . dexmedetomidine 0.7 mcg/kg/hr (10/14/15 0600)  . DOPamine 3 mcg/kg/min (10/14/15 0600)  . insulin (NOVOLIN-R) infusion Stopped (10/13/15 1905)  . lactated ringers 20 mL (10/14/15 0205)  . lactated ringers Stopped (10/13/15 1900)  . milrinone 0.25 mcg/kg/min (10/14/15 0600)  . nitroGLYCERIN Stopped (10/13/15 1630)  . phenylephrine (NEO-SYNEPHRINE) Adult infusion 35 mcg/min (10/14/15 0600)    Scheduled Medications: . acetaminophen  1,000 mg Oral 4 times per day   Or  . acetaminophen (TYLENOL) oral liquid 160 mg/5 mL  1,000 mg Per Tube 4 times per day  . antiseptic oral rinse  7 mL Mouth Rinse QID  . aspirin EC  325 mg Oral Daily   Or  . aspirin  324 mg Per Tube Daily  . bisacodyl  10 mg Oral Daily   Or  . bisacodyl  10 mg Rectal Daily  . cefUROXime (ZINACEF)  IV  1.5 g Intravenous Q12H  . chlorhexidine gluconate  15 mL Mouth Rinse BID  . docusate sodium  200 mg Oral Daily  . insulin aspart  0-24 Units Subcutaneous 6 times per day   .  insulin regular  0-10 Units Intravenous TID WC  . magnesium sulfate  4 g Intravenous Once  . metoprolol tartrate  12.5 mg Oral BID   Or  . metoprolol tartrate  12.5 mg Per Tube BID  . [START ON 10/15/2015] pantoprazole  40 mg Oral Daily  . simvastatin  20 mg Oral Daily  . sodium chloride  3 mL Intravenous Q12H    have reviewed scheduled and prn medications.  Physical Exam: General: pale, on vent, sedated Heart: RRR Lungs: CBS bilat Abdomen: distended Extremities: some peripheral edema    10/14/2015,7:11 AM  LOS: 6 days

## 2015-10-14 NOTE — Progress Notes (Signed)
Patient ID: Jordan BaldyLarry W Seelinger, male   DOB: 01/19/1946, 69 y.o.   MRN: [id number]  SICU Evening Rounds:  Hemodynamics stable on dop, milrinone and neo  Good diuresis today on lasix drip. -1600 cc so far  BMET    Component Value Date/Time   NA 139 10/14/2015 1624   K 3.5 10/14/2015 1624   CL 100* 10/14/2015 1624   CO2 25 10/14/2015 1620   GLUCOSE 134* 10/14/2015 1624   BUN 25* 10/14/2015 1624   CREATININE 2.10* 10/14/2015 1624   CREATININE 1.96* 11/28/2012 1233   CALCIUM 8.0* 10/14/2015 1620   GFRNONAA 28* 10/14/2015 1620   GFRAA 32* 10/14/2015 1620    CBC    Component Value Date/Time   WBC 15.5* 10/14/2015 1620   RBC 3.05* 10/14/2015 1620   RBC 3.29* 01/16/2012 2214   HGB 8.8* 10/14/2015 1624   HCT 26.0* 10/14/2015 1624   PLT 167 10/14/2015 1620   MCV 85.6 10/14/2015 1620   MCH 28.9 10/14/2015 1620   MCHC 33.7 10/14/2015 1620   RDW 16.9* 10/14/2015 1620   LYMPHSABS 0.9 08/23/2013 1054   MONOABS 1.0 08/23/2013 1054   EOSABS 0.0 08/23/2013 1054   BASOSABS 0.0 08/23/2013 1054    Remains sedated on vent but FiO2 down to 80%.

## 2015-10-14 NOTE — Progress Notes (Signed)
Initial Nutrition Assessment  DOCUMENTATION CODES:   Obesity unspecified  INTERVENTION:  Recommend initiating TF within 24 hours: Initiate Vital High Protein @ 20 ml/hr via OGT and increase by 10 ml every 4 hours to goal rate of 55 ml/hr.   30 ml Prostat once daily.    Tube feeding regimen provides 1420 kcal (100% of needs), 131 grams of protein, and 1103 ml of H2O.    NUTRITION DIAGNOSIS:   Inadequate oral intake related to inability to eat as evidenced by NPO status.   GOAL:   Provide needs based on ASPEN/SCCM guidelines   MONITOR:   TF tolerance, Vent status, Labs, Skin, I & O's  REASON FOR ASSESSMENT:   Ventilator    ASSESSMENT:   69yo WM admitted for SOB and chest and back pain. Followed by Dr Eliott Nineunham for CKD 3/4 sec to HTN.  S/P Procedure(s) (LRB): CORONARY ARTERY BYPASS GRAFTING (CABG) x four using left internal mammary artery and left leg greater saphenous vein harvested endoscopically (N/A) TRANSESOPHAGEAL ECHOCARDIOGRAM (TEE) (N/A) MITRAL VALVE (MV) REPLACEMENT (N/A) TRICUSPID VALVE REPAIR (N/A)  Patient is currently intubated on ventilator support MV: 14.6 L/min Temp (24hrs), Avg:98.9 F (37.2 C), Min:96.8 F (36 C), Max:101.1 F (38.4 C)  Propofol: none   Diet Order:   NPO  Skin:  Wound (see comment) (closed incisions on chest, R leg, and L leg)  Last BM:  12/21  Height:   Ht Readings from Last 1 Encounters:  10/13/15 5\' 6"  (1.676 m)    Weight:   Wt Readings from Last 1 Encounters:  10/14/15 229 lb 8 oz (104.1 kg)    Ideal Body Weight:  64.5 kg  BMI:  Body mass index is 37.06 kg/(m^2).  Estimated Nutritional Needs:   Kcal:  [id number]  Protein:  >/=129 grams  Fluid:  per MD  EDUCATION NEEDS:   No education needs identified at this time  Dorothea Ogleeanne Carlyann Placide RD, LDN Inpatient Clinical Dietitian Pager: [phone] After Hours Pager: [phone]

## 2015-10-14 NOTE — Progress Notes (Signed)
Subjective:  POD # 1 CABG X 4. Remains intubated on Neo, paced  Objective:  Temp:  [96.8 F (36 C)-101.1 F (38.4 C)] 101.1 F (38.4 C) (12/24 1045) Pulse Rate:  [87-90] 89 (12/24 1045) Resp:  [14-37] 28 (12/24 1045) BP: (66-104)/(52-73) 91/60 mmHg (12/24 1000) SpO2:  [92 %-100 %] 95 % (12/24 1045) Arterial Line BP: (78-113)/(44-71) 109/54 mmHg (12/24 1045) FiO2 (%):  [80 %-100 %] 80 % (12/24 0713) Weight:  [229 lb 8 oz (104.1 kg)] 229 lb 8 oz (104.1 kg) (12/24 0500) Weight change: 18 lb 6.4 oz (8.346 kg)  Intake/Output from previous day: 12/23 0701 - 12/24 0700 In: 8942.7 [I.V.:6607.7; Blood:500; NG/GT:60; IV Piggyback:1775] Out: 1638 [Urine:1900; Emesis/NG output:50; Blood:2300; Chest Tube:730]  Intake/Output from this shift: Total I/O In: 361.9 [I.V.:311.9; IV Piggyback:50] Out: -   Physical Exam: General appearance: alert and slowed mentation Neck: no adenopathy, no carotid bruit, no JVD, supple, symmetrical, trachea midline and thyroid not enlarged, symmetric, no tenderness/mass/nodules Lungs: clear to auscultation bilaterally Heart: regular rate and rhythm, S1, S2 normal, no murmur, click, rub or gallop Extremities: extremities normal, atraumatic, no cyanosis or edema  Lab Results: Results for orders placed or performed during the hospital encounter of 10/08/15 (from the past 48 hour(s))  Heparin level (unfractionated)     Status: None   Collection Time: 10/12/15  1:15 PM  Result Value Ref Range   Heparin Unfractionated 0.45 0.30 - 0.70 IU/mL    Comment:        IF HEPARIN RESULTS ARE BELOW EXPECTED VALUES, AND PATIENT DOSAGE HAS BEEN CONFIRMED, SUGGEST FOLLOW UP TESTING OF ANTITHROMBIN III LEVELS.   Type and screen [place]     Status: None   Collection Time: 10/12/15  9:38 PM  Result Value Ref Range   ABO/RH(D) B POS    Antibody Screen NEG    Sample Expiration 10/15/2015   Hemoglobin A1c     Status: None   Collection Time:  10/12/15  9:48 PM  Result Value Ref Range   Hgb A1c MFr Bld 5.6 4.8 - 5.6 %    Comment: (NOTE)         Pre-diabetes: 5.7 - 6.4         Diabetes: >6.4         Glycemic control for adults with diabetes: <7.0    Mean Plasma Glucose 114 mg/dL    Comment: (NOTE) Performed At: [facility] [place], [place] [id number] Lindon Romp MD SV:[id number]   CBC     Status: Abnormal   Collection Time: 10/13/15  5:45 AM  Result Value Ref Range   WBC 9.5 4.0 - 10.5 K/uL   RBC 5.12 4.22 - 5.81 MIL/uL   Hemoglobin 14.2 13.0 - 17.0 g/dL   HCT 45.1 39.0 - 52.0 %   MCV 88.1 78.0 - 100.0 fL   MCH 27.7 26.0 - 34.0 pg   MCHC 31.5 30.0 - 36.0 g/dL   RDW 17.5 (H) 11.5 - 15.5 %   Platelets 271 150 - 400 K/uL  Heparin level (unfractionated)     Status: Abnormal   Collection Time: 10/13/15  5:45 AM  Result Value Ref Range   Heparin Unfractionated 0.11 (L) 0.30 - 0.70 IU/mL    Comment:        IF HEPARIN RESULTS ARE BELOW EXPECTED VALUES, AND PATIENT DOSAGE HAS BEEN CONFIRMED, SUGGEST FOLLOW UP TESTING OF ANTITHROMBIN III LEVELS.   Renal function panel  Status: Abnormal   Collection Time: 10/13/15  5:45 AM  Result Value Ref Range   Sodium 140 135 - 145 mmol/L   Potassium 3.6 3.5 - 5.1 mmol/L   Chloride 103 101 - 111 mmol/L   CO2 25 22 - 32 mmol/L   Glucose, Bld 101 (H) 65 - 99 mg/dL   BUN 35 (H) 6 - 20 mg/dL   Creatinine, Ser 2.41 (H) 0.61 - 1.24 mg/dL   Calcium 9.1 8.9 - 10.3 mg/dL   Phosphorus 3.8 2.5 - 4.6 mg/dL   Albumin 3.1 (L) 3.5 - 5.0 g/dL   GFR calc non Af Amer 25 (L) >60 mL/min   GFR calc Af Amer 29 (L) >60 mL/min    Comment: (NOTE) The eGFR has been calculated using the CKD EPI equation. This calculation has not been validated in all clinical situations. eGFR's persistently <60 mL/min signify possible Chronic Kidney Disease.    Anion gap 12 5 - 15  Basic metabolic panel     Status: Abnormal   Collection Time: 10/13/15  5:45 AM  Result Value Ref  Range   Sodium 140 135 - 145 mmol/L   Potassium 3.6 3.5 - 5.1 mmol/L   Chloride 103 101 - 111 mmol/L   CO2 25 22 - 32 mmol/L   Glucose, Bld 105 (H) 65 - 99 mg/dL   BUN 35 (H) 6 - 20 mg/dL   Creatinine, Ser 2.44 (H) 0.61 - 1.24 mg/dL   Calcium 9.1 8.9 - 10.3 mg/dL   GFR calc non Af Amer 25 (L) >60 mL/min   GFR calc Af Amer 29 (L) >60 mL/min    Comment: (NOTE) The eGFR has been calculated using the CKD EPI equation. This calculation has not been validated in all clinical situations. eGFR's persistently <60 mL/min signify possible Chronic Kidney Disease.    Anion gap 12 5 - 15  I-STAT, chem 8     Status: Abnormal   Collection Time: 10/13/15  8:04 AM  Result Value Ref Range   Sodium 138 135 - 145 mmol/L   Potassium 3.4 (L) 3.5 - 5.1 mmol/L   Chloride 103 101 - 111 mmol/L   BUN 34 (H) 6 - 20 mg/dL   Creatinine, Ser 2.40 (H) 0.61 - 1.24 mg/dL   Glucose, Bld 124 (H) 65 - 99 mg/dL   Calcium, Ion 1.17 1.13 - 1.30 mmol/L   TCO2 24 0 - 100 mmol/L   Hemoglobin 14.6 13.0 - 17.0 g/dL   HCT 43.0 39.0 - 52.0 %  I-STAT, chem 8     Status: Abnormal   Collection Time: 10/13/15  9:37 AM  Result Value Ref Range   Sodium 137 135 - 145 mmol/L   Potassium 3.9 3.5 - 5.1 mmol/L   Chloride 100 (L) 101 - 111 mmol/L   BUN 33 (H) 6 - 20 mg/dL   Creatinine, Ser 2.40 (H) 0.61 - 1.24 mg/dL   Glucose, Bld 161 (H) 65 - 99 mg/dL   Calcium, Ion 1.18 1.13 - 1.30 mmol/L   TCO2 25 0 - 100 mmol/L   Hemoglobin 13.9 13.0 - 17.0 g/dL   HCT 41.0 39.0 - 52.0 %  I-STAT 3, arterial blood gas (G3+)     Status: Abnormal   Collection Time: 10/13/15 10:14 AM  Result Value Ref Range   pH, Arterial 7.333 (L) 7.350 - 7.450   pCO2 arterial 50.8 (H) 35.0 - 45.0 mmHg   pO2, Arterial 397.0 (H) 80.0 - 100.0 mmHg   Bicarbonate 26.9 (  H) 20.0 - 24.0 mEq/L   TCO2 28 0 - 100 mmol/L   O2 Saturation 100.0 %   Acid-Base Excess 1.0 0.0 - 2.0 mmol/L   Patient temperature HIDE    Sample type ARTERIAL   I-STAT, chem 8     Status:  Abnormal   Collection Time: 10/13/15 10:20 AM  Result Value Ref Range   Sodium 138 135 - 145 mmol/L   Potassium 3.5 3.5 - 5.1 mmol/L   Chloride 98 (L) 101 - 111 mmol/L   BUN 32 (H) 6 - 20 mg/dL   Creatinine, Ser 2.00 (H) 0.61 - 1.24 mg/dL   Glucose, Bld 148 (H) 65 - 99 mg/dL   Calcium, Ion 1.04 (L) 1.13 - 1.30 mmol/L   TCO2 28 0 - 100 mmol/L   Hemoglobin 10.2 (L) 13.0 - 17.0 g/dL   HCT 30.0 (L) 39.0 - 52.0 %  I-STAT, chem 8     Status: Abnormal   Collection Time: 10/13/15 11:25 AM  Result Value Ref Range   Sodium 137 135 - 145 mmol/L   Potassium 3.8 3.5 - 5.1 mmol/L   Chloride 101 101 - 111 mmol/L   BUN 31 (H) 6 - 20 mg/dL   Creatinine, Ser 2.20 (H) 0.61 - 1.24 mg/dL   Glucose, Bld 165 (H) 65 - 99 mg/dL   Calcium, Ion 1.03 (L) 1.13 - 1.30 mmol/L   TCO2 27 0 - 100 mmol/L   Hemoglobin 9.9 (L) 13.0 - 17.0 g/dL   HCT 29.0 (L) 39.0 - 52.0 %  I-STAT, chem 8     Status: Abnormal   Collection Time: 10/13/15 12:39 PM  Result Value Ref Range   Sodium 135 135 - 145 mmol/L   Potassium 4.2 3.5 - 5.1 mmol/L   Chloride 100 (L) 101 - 111 mmol/L   BUN 36 (H) 6 - 20 mg/dL   Creatinine, Ser 2.20 (H) 0.61 - 1.24 mg/dL   Glucose, Bld 186 (H) 65 - 99 mg/dL   Calcium, Ion 1.05 (L) 1.13 - 1.30 mmol/L   TCO2 26 0 - 100 mmol/L   Hemoglobin 10.5 (L) 13.0 - 17.0 g/dL   HCT 31.0 (L) 39.0 - 52.0 %  Hemoglobin and hematocrit, blood     Status: Abnormal   Collection Time: 10/13/15  1:32 PM  Result Value Ref Range   Hemoglobin 9.2 (L) 13.0 - 17.0 g/dL    Comment: REPEATED TO VERIFY RESULT CALLED TO, READ BACK BY AND VERIFIED WITH: TICE,M RN @ 1346 10/13/15 LEONARD,A    HCT 27.7 (L) 39.0 - 52.0 %    Comment: REPEATED TO VERIFY RESULT CALLED TO, READ BACK BY AND VERIFIED WITH: TICE,M RN @ 1346 10/13/15 LEONARD,A   Platelet count     Status: Abnormal   Collection Time: 10/13/15  1:32 PM  Result Value Ref Range   Platelets 137 (L) 150 - 400 K/uL    Comment: REPEATED TO VERIFY RESULT CALLED TO,  READ BACK BY AND VERIFIED WITH: TICE,M RN @ 1346 10/13/15 LEONARD,A   I-STAT, chem 8     Status: Abnormal   Collection Time: 10/13/15  1:37 PM  Result Value Ref Range   Sodium 133 (L) 135 - 145 mmol/L   Potassium 4.1 3.5 - 5.1 mmol/L   Chloride 99 (L) 101 - 111 mmol/L   BUN 37 (H) 6 - 20 mg/dL   Creatinine, Ser 2.20 (H) 0.61 - 1.24 mg/dL   Glucose, Bld 219 (H) 65 - 99 mg/dL   Calcium,  Ion 1.03 (L) 1.13 - 1.30 mmol/L   TCO2 25 0 - 100 mmol/L   Hemoglobin 9.2 (L) 13.0 - 17.0 g/dL   HCT 27.0 (L) 39.0 - 52.0 %  I-STAT 4, (NA,K, GLUC, HGB,HCT)     Status: Abnormal   Collection Time: 10/13/15  4:42 PM  Result Value Ref Range   Sodium 137 135 - 145 mmol/L   Potassium 3.6 3.5 - 5.1 mmol/L   Glucose, Bld 116 (H) 65 - 99 mg/dL   HCT 30.0 (L) 39.0 - 52.0 %   Hemoglobin 10.2 (L) 13.0 - 17.0 g/dL  I-STAT 3, arterial blood gas (G3+)     Status: Abnormal   Collection Time: 10/13/15  4:42 PM  Result Value Ref Range   pH, Arterial 7.286 (L) 7.350 - 7.450   pCO2 arterial 47.9 (H) 35.0 - 45.0 mmHg   pO2, Arterial 47.0 (L) 80.0 - 100.0 mmHg   Bicarbonate 22.8 20.0 - 24.0 mEq/L   TCO2 24 0 - 100 mmol/L   O2 Saturation 77.0 %   Acid-base deficit 4.0 (H) 0.0 - 2.0 mmol/L   Patient temperature HIDE    Sample type ARTERIAL   CBC     Status: Abnormal   Collection Time: 10/13/15  4:50 PM  Result Value Ref Range   WBC 15.2 (H) 4.0 - 10.5 K/uL   RBC 3.57 (L) 4.22 - 5.81 MIL/uL   Hemoglobin 10.2 (L) 13.0 - 17.0 g/dL   HCT 30.8 (L) 39.0 - 52.0 %   MCV 86.3 78.0 - 100.0 fL   MCH 28.6 26.0 - 34.0 pg   MCHC 33.1 30.0 - 36.0 g/dL   RDW 16.5 (H) 11.5 - 15.5 %   Platelets 149 (L) 150 - 400 K/uL  Protime-INR     Status: Abnormal   Collection Time: 10/13/15  4:50 PM  Result Value Ref Range   Prothrombin Time 19.4 (H) 11.6 - 15.2 seconds   INR 1.63 (H) 0.00 - 1.49  APTT     Status: None   Collection Time: 10/13/15  4:50 PM  Result Value Ref Range   aPTT 34 24 - 37 seconds  Magnesium     Status: None    Collection Time: 10/13/15  5:10 PM  Result Value Ref Range   Magnesium 2.1 1.7 - 2.4 mg/dL  I-STAT 3, arterial blood gas (G3+)     Status: Abnormal   Collection Time: 10/13/15  5:34 PM  Result Value Ref Range   pH, Arterial 7.291 (L) 7.350 - 7.450   pCO2 arterial 47.3 (H) 35.0 - 45.0 mmHg   pO2, Arterial 61.0 (L) 80.0 - 100.0 mmHg   Bicarbonate 23.0 20.0 - 24.0 mEq/L   TCO2 24 0 - 100 mmol/L   O2 Saturation 89.0 %   Acid-base deficit 4.0 (H) 0.0 - 2.0 mmol/L   Patient temperature 36.2 C    Sample type ARTERIAL   Glucose, capillary     Status: None   Collection Time: 10/13/15  6:03 PM  Result Value Ref Range   Glucose-Capillary 79 65 - 99 mg/dL   Comment 1 Capillary Specimen   Glucose, capillary     Status: None   Collection Time: 10/13/15  6:03 PM  Result Value Ref Range   Glucose-Capillary 84 65 - 99 mg/dL   Comment 1 Arterial Specimen   Glucose, capillary     Status: None   Collection Time: 10/13/15  7:06 PM  Result Value Ref Range   Glucose-Capillary 81 65 -  99 mg/dL   Comment 1 Arterial Specimen   Glucose, capillary     Status: Abnormal   Collection Time: 10/13/15  8:10 PM  Result Value Ref Range   Glucose-Capillary 101 (H) 65 - 99 mg/dL   Comment 1 Arterial Specimen   Glucose, capillary     Status: None   Collection Time: 10/13/15  8:29 PM  Result Value Ref Range   Glucose-Capillary 91 65 - 99 mg/dL   Comment 1 Capillary Specimen    Comment 2 Notify RN   I-STAT 3, arterial blood gas (G3+)     Status: Abnormal   Collection Time: 10/13/15  9:54 PM  Result Value Ref Range   pH, Arterial 7.326 (L) 7.350 - 7.450   pCO2 arterial 46.5 (H) 35.0 - 45.0 mmHg   pO2, Arterial 66.0 (L) 80.0 - 100.0 mmHg   Bicarbonate 24.5 (H) 20.0 - 24.0 mEq/L   TCO2 26 0 - 100 mmol/L   O2 Saturation 92.0 %   Acid-base deficit 2.0 0.0 - 2.0 mmol/L   Patient temperature 36.3 C    Sample type ARTERIAL   I-STAT, chem 8     Status: Abnormal   Collection Time: 10/13/15  9:59 PM  Result  Value Ref Range   Sodium 136 135 - 145 mmol/L   Potassium 4.0 3.5 - 5.1 mmol/L   Chloride 101 101 - 111 mmol/L   BUN 28 (H) 6 - 20 mg/dL   Creatinine, Ser 2.00 (H) 0.61 - 1.24 mg/dL   Glucose, Bld 143 (H) 65 - 99 mg/dL   Calcium, Ion 1.12 (L) 1.13 - 1.30 mmol/L   TCO2 23 0 - 100 mmol/L   Hemoglobin 10.9 (L) 13.0 - 17.0 g/dL   HCT 32.0 (L) 39.0 - 52.0 %  Creatinine, serum     Status: Abnormal   Collection Time: 10/13/15 10:02 PM  Result Value Ref Range   Creatinine, Ser 2.22 (H) 0.61 - 1.24 mg/dL   GFR calc non Af Amer 28 (L) >60 mL/min   GFR calc Af Amer 33 (L) >60 mL/min    Comment: (NOTE) The eGFR has been calculated using the CKD EPI equation. This calculation has not been validated in all clinical situations. eGFR's persistently <60 mL/min signify possible Chronic Kidney Disease.   Magnesium     Status: None   Collection Time: 10/13/15 10:02 PM  Result Value Ref Range   Magnesium 2.4 1.7 - 2.4 mg/dL  CBC     Status: Abnormal   Collection Time: 10/13/15 10:02 PM  Result Value Ref Range   WBC 17.2 (H) 4.0 - 10.5 K/uL   RBC 3.66 (L) 4.22 - 5.81 MIL/uL   Hemoglobin 10.1 (L) 13.0 - 17.0 g/dL   HCT 31.5 (L) 39.0 - 52.0 %   MCV 86.1 78.0 - 100.0 fL   MCH 27.6 26.0 - 34.0 pg   MCHC 32.1 30.0 - 36.0 g/dL   RDW 16.4 (H) 11.5 - 15.5 %   Platelets 190 150 - 400 K/uL  Glucose, capillary     Status: Abnormal   Collection Time: 10/14/15 12:11 AM  Result Value Ref Range   Glucose-Capillary 170 (H) 65 - 99 mg/dL   Comment 1 Arterial Specimen   Glucose, capillary     Status: Abnormal   Collection Time: 10/14/15  4:03 AM  Result Value Ref Range   Glucose-Capillary 124 (H) 65 - 99 mg/dL   Comment 1 Arterial Specimen   I-STAT 3, arterial blood gas (G3+)  Status: Abnormal   Collection Time: 10/14/15  4:05 AM  Result Value Ref Range   pH, Arterial 7.406 7.350 - 7.450   pCO2 arterial 34.5 (L) 35.0 - 45.0 mmHg   pO2, Arterial 96.0 80.0 - 100.0 mmHg   Bicarbonate 21.5 20.0 - 24.0  mEq/L   TCO2 23 0 - 100 mmol/L   O2 Saturation 97.0 %   Acid-base deficit 3.0 (H) 0.0 - 2.0 mmol/L   Patient temperature 37.7 C    Sample type ARTERIAL   CBC     Status: Abnormal   Collection Time: 10/14/15  4:13 AM  Result Value Ref Range   WBC 12.6 (H) 4.0 - 10.5 K/uL   RBC 3.14 (L) 4.22 - 5.81 MIL/uL   Hemoglobin 8.9 (L) 13.0 - 17.0 g/dL   HCT 26.9 (L) 39.0 - 52.0 %   MCV 85.7 78.0 - 100.0 fL   MCH 28.3 26.0 - 34.0 pg   MCHC 33.1 30.0 - 36.0 g/dL   RDW 16.5 (H) 11.5 - 15.5 %   Platelets 157 150 - 400 K/uL  Basic metabolic panel     Status: Abnormal   Collection Time: 10/14/15  4:13 AM  Result Value Ref Range   Sodium 138 135 - 145 mmol/L   Potassium 4.2 3.5 - 5.1 mmol/L   Chloride 106 101 - 111 mmol/L   CO2 23 22 - 32 mmol/L   Glucose, Bld 133 (H) 65 - 99 mg/dL   BUN 26 (H) 6 - 20 mg/dL   Creatinine, Ser 2.13 (H) 0.61 - 1.24 mg/dL   Calcium 7.8 (L) 8.9 - 10.3 mg/dL   GFR calc non Af Amer 30 (L) >60 mL/min   GFR calc Af Amer 34 (L) >60 mL/min    Comment: (NOTE) The eGFR has been calculated using the CKD EPI equation. This calculation has not been validated in all clinical situations. eGFR's persistently <60 mL/min signify possible Chronic Kidney Disease.    Anion gap 9 5 - 15  Magnesium     Status: None   Collection Time: 10/14/15  4:13 AM  Result Value Ref Range   Magnesium 2.0 1.7 - 2.4 mg/dL    Imaging: Imaging results have been reviewed, diffuse patchy infiltrates c/w pulmonary edema  Tele- Paced rhythm  Assessment/Plan:   1. Principal Problem: 2.   Acute diastolic (congestive) heart failure ([place]) 3. Active Problems: 4.   Hyperlipidemia 5.   Hypertensive heart disease 6.   Cervical spondylosis with radiculopathy 7.   Stage III chronic kidney disease 8.   Non-STEMI (non-ST elevated myocardial infarction) ([place]) 9.   Demand ischemia ([place]) 10.   Atrial fibrillation with RVR ([place]) 11.   ACS (acute coronary syndrome) ([place]) 12.   Acute congestive heart  failure ([place]) 13.   Tricuspid valve regurgitation 14.   Time Spent Directly with Patient:  20 minutes  Length of Stay:  LOS: 6 days   POD # 1 CABG X 4. ACS. 3VD, nl LV by 2D. Remains intubated. Paced rhythm. On neo with soft BP. I/O +. Lasix drip started. CHF on CXR. No change Rx per TCTS  Quay Burow 10/14/2015, 11:03 AM

## 2015-10-15 ENCOUNTER — Inpatient Hospital Stay (HOSPITAL_COMMUNITY): Payer: PPO

## 2015-10-15 DIAGNOSIS — I5041 Acute combined systolic (congestive) and diastolic (congestive) heart failure: Secondary | ICD-10-CM

## 2015-10-15 LAB — GLUCOSE, CAPILLARY
GLUCOSE-CAPILLARY: 117 mg/dL — AB (ref 65–99)
Glucose-Capillary: 102 mg/dL — ABNORMAL HIGH (ref 65–99)
Glucose-Capillary: 104 mg/dL — ABNORMAL HIGH (ref 65–99)
Glucose-Capillary: 97 mg/dL (ref 65–99)
Glucose-Capillary: 116 mg/dL — ABNORMAL HIGH (ref 65–99)

## 2015-10-15 LAB — BASIC METABOLIC PANEL
ANION GAP: 9 (ref 5–15)
BUN: 28 mg/dL — ABNORMAL HIGH (ref 6–20)
CALCIUM: 7.8 mg/dL — AB (ref 8.9–10.3)
CO2: 26 mmol/L (ref 22–32)
Chloride: 103 mmol/L (ref 101–111)
Creatinine, Ser: 2.26 mg/dL — ABNORMAL HIGH (ref 0.61–1.24)
GFR, EST AFRICAN AMERICAN: 32 mL/min — AB (ref >60)
GFR, EST NON AFRICAN AMERICAN: 27 mL/min — AB (ref >60)
Glucose, Bld: 121 mg/dL — ABNORMAL HIGH (ref 65–99)
Potassium: 3.2 mmol/L — ABNORMAL LOW (ref 3.5–5.1)
SODIUM: 138 mmol/L (ref 135–145)

## 2015-10-15 LAB — POCT I-STAT 3, ART BLOOD GAS (G3+)
Acid-Base Excess: 2 mmol/L (ref 0.0–2.0)
Acid-Base Excess: 4 mmol/L — ABNORMAL HIGH (ref 0.0–2.0)
Acid-Base Excess: 2 mmol/L (ref 0.0–2.0)
Bicarbonate: 25.7 mEq/L — ABNORMAL HIGH (ref 20.0–24.0)
Bicarbonate: 27.4 mEq/L — ABNORMAL HIGH (ref 20.0–24.0)
Bicarbonate: 24.3 mEq/L — ABNORMAL HIGH (ref 20.0–24.0)
O2 SAT: 96 %
O2 SAT: 92 %
O2 Saturation: 94 %
PCO2 ART: 37.6 mmHg (ref 35.0–45.0)
PCO2 ART: 30.2 mmHg — AB (ref 35.0–45.0)
PH ART: 7.515 — AB (ref 7.350–7.450)
PO2 ART: 65 mmHg — AB (ref 80.0–100.0)
Patient temperature: 38.4
Patient temperature: 37.2
TCO2: 27 mmol/L (ref 0–100)
TCO2: 28 mmol/L (ref 0–100)
TCO2: 25 mmol/L (ref 0–100)
pCO2 arterial: 37.7 mmHg (ref 35.0–45.0)
pH, Arterial: 7.448 (ref 7.350–7.450)
pH, Arterial: 7.475 — ABNORMAL HIGH (ref 7.350–7.450)
pO2, Arterial: 82 mmHg (ref 80.0–100.0)
pO2, Arterial: 62 mmHg — ABNORMAL LOW (ref 80.0–100.0)

## 2015-10-15 LAB — POCT I-STAT, CHEM 8
BUN: 29 mg/dL — AB (ref 6–20)
CHLORIDE: 98 mmol/L — AB (ref 101–111)
Calcium, Ion: 1.08 mmol/L — ABNORMAL LOW (ref 1.13–1.30)
Creatinine, Ser: 2.4 mg/dL — ABNORMAL HIGH (ref 0.61–1.24)
Glucose, Bld: 121 mg/dL — ABNORMAL HIGH (ref 65–99)
HEMATOCRIT: 26 % — AB (ref 39.0–52.0)
Hemoglobin: 8.8 g/dL — ABNORMAL LOW (ref 13.0–17.0)
Potassium: 3.5 mmol/L (ref 3.5–5.1)
SODIUM: 137 mmol/L (ref 135–145)
TCO2: 23 mmol/L (ref 0–100)

## 2015-10-15 LAB — CBC
HCT: 26.4 % — ABNORMAL LOW (ref 39.0–52.0)
HEMOGLOBIN: 8.9 g/dL — AB (ref 13.0–17.0)
MCH: 29.1 pg (ref 26.0–34.0)
MCHC: 33.7 g/dL (ref 30.0–36.0)
MCV: 86.3 fL (ref 78.0–100.0)
Platelets: 194 10*3/uL (ref 150–400)
RBC: 3.06 MIL/uL — AB (ref 4.22–5.81)
RDW: 16.9 % — ABNORMAL HIGH (ref 11.5–15.5)
WBC: 17.3 10*3/uL — ABNORMAL HIGH (ref 4.0–10.5)

## 2015-10-15 MED ORDER — POTASSIUM CHLORIDE 10 MEQ/50ML IV SOLN
10.0000 meq | INTRAVENOUS | Status: AC
  Administered 2015-10-15 (×2): 10 meq via INTRAVENOUS
  Filled 2015-10-15 (×2): qty 50

## 2015-10-15 MED ORDER — POTASSIUM CHLORIDE 10 MEQ/50ML IV SOLN
10.0000 meq | INTRAVENOUS | Status: AC
  Administered 2015-10-15 (×3): 10 meq via INTRAVENOUS
  Filled 2015-10-15 (×3): qty 50

## 2015-10-15 MED ORDER — AMIODARONE HCL IN DEXTROSE 360-4.14 MG/200ML-% IV SOLN
30.0000 mg/h | INTRAVENOUS | Status: DC
  Administered 2015-10-15 – 2015-10-17 (×3): 30 mg/h via INTRAVENOUS
  Filled 2015-10-15 (×3): qty 200

## 2015-10-15 MED ORDER — ALBUMIN HUMAN 5 % IV SOLN
12.5000 g | Freq: Once | INTRAVENOUS | Status: AC
  Administered 2015-10-15: 12.5 g via INTRAVENOUS
  Filled 2015-10-15: qty 250

## 2015-10-15 MED ORDER — AMIODARONE HCL IN DEXTROSE 360-4.14 MG/200ML-% IV SOLN
INTRAVENOUS | Status: AC
Start: 2015-10-15 — End: 2015-10-15
  Filled 2015-10-15: qty 200

## 2015-10-15 MED ORDER — AMIODARONE LOAD VIA INFUSION
150.0000 mg | Freq: Once | INTRAVENOUS | Status: AC
  Administered 2015-10-15: 150 mg via INTRAVENOUS
  Filled 2015-10-15: qty 83.34

## 2015-10-15 MED ORDER — PHENYLEPHRINE HCL 10 MG/ML IJ SOLN
0.0000 ug/min | INTRAMUSCULAR | Status: DC
  Administered 2015-10-15: 80 ug/min via INTRAVENOUS
  Filled 2015-10-15 (×2): qty 4

## 2015-10-15 MED ORDER — VANCOMYCIN HCL 10 G IV SOLR
1250.0000 mg | INTRAVENOUS | Status: DC
  Administered 2015-10-15 – 2015-10-16 (×2): 1250 mg via INTRAVENOUS
  Filled 2015-10-15 (×3): qty 1250

## 2015-10-15 MED ORDER — AMIODARONE HCL IN DEXTROSE 360-4.14 MG/200ML-% IV SOLN
60.0000 mg/h | INTRAVENOUS | Status: AC
  Administered 2015-10-15 (×2): 60 mg/h via INTRAVENOUS
  Filled 2015-10-15: qty 200

## 2015-10-15 NOTE — Procedures (Signed)
Extubation Procedure Note  Patient Details:   Name: Marcelo BaldyLarry W Stegner DOB: Sep 04, 1946 MRN: [id number]   Airway Documentation:     Evaluation  O2 sats: stable throughout Complications: No apparent complications Patient did tolerate procedure well. Bilateral Breath Sounds: Diminished Suctioning: Oral, Airway Yes  PT was extubated to a 5L [place]  PT tried to speak. NIF(-30) VC (4L)  sats are stable   Panda Crossin, Duane LopeJeffrey D 10/15/2015, 1:41 PM

## 2015-10-15 NOTE — Progress Notes (Signed)
2 Days Post-Op Procedure(s) (LRB): CORONARY ARTERY BYPASS GRAFTING (CABG) x four using left internal mammary artery and left leg greater saphenous vein harvested endoscopically (N/A) TRANSESOPHAGEAL ECHOCARDIOGRAM (TEE) (N/A) MITRAL VALVE (MV) REPLACEMENT (N/A) TRICUSPID VALVE REPAIR (N/A) Subjective:  Sedated on vent.  Objective: Vital signs in last 24 hours: Temp:  [100.9 F (38.3 C)-101.7 F (38.7 C)] 101.1 F (38.4 C) (12/25 1138) Pulse Rate:  [20-152] 89 (12/25 1138) Cardiac Rhythm:  [-] Atrial paced (12/25 0800) Resp:  [19-33] 27 (12/25 1138) BP: (71-103)/(50-70) 95/61 mmHg (12/25 0400) SpO2:  [83 %-100 %] 95 % (12/25 1138) Arterial Line BP: (61-148)/(39-70) 90/49 mmHg (12/25 0900) FiO2 (%):  [40 %-100 %] 40 % (12/25 1138) Weight:  [100.8 kg (222 lb 3.6 oz)] 100.8 kg (222 lb 3.6 oz) (12/25 0439)  Hemodynamic parameters for last 24 hours: PAP: (26-92)/(1-28) 34/16 mmHg CO:  [4.6 L/min-5.3 L/min] 4.6 L/min CI:  [2.3 L/min/m2-2.6 L/min/m2] 2.3 L/min/m2  Intake/Output from previous day: 12/24 0701 - 12/25 0700 In: 3725.3 [I.V.:3475.3; IV Piggyback:250] Out: 6620 [Urine:5620; Emesis/NG output:700; Chest Tube:300] Intake/Output this shift: Total I/O In: 438.2 [I.V.:258.2; NG/GT:30; IV Piggyback:150] Out: 965 [Urine:900; Emesis/NG output:55; Chest Tube:10]  General appearance: sedated on vent, just received ativan. Heart: regular rate and rhythm, S1, S2 normal, no murmur, click, rub or gallop Lungs: rales bilaterally Abdomen: soft, hypoactive BS Extremities: edema moderate  Wound: dressings dry  Lab Results:  Recent Labs  10/14/15 1620 10/14/15 1624 10/15/15 0450  WBC 15.5*  --  17.3*  HGB 8.8* 8.8* 8.9*  HCT 26.1* 26.0* 26.4*  PLT 167  --  194   BMET:  Recent Labs  10/14/15 1620 10/14/15 1624 10/15/15 0450  NA 138 139 138  K 3.5 3.5 3.2*  CL 105 100* 103  CO2 25  --  26  GLUCOSE 138* 134* 121*  BUN 27* 25* 28*  CREATININE 2.25* 2.10* 2.26*   CALCIUM 8.0*  --  7.8*    PT/INR:  Recent Labs  10/13/15 1650  LABPROT 19.4*  INR 1.63*   ABG    Component Value Date/Time   PHART 7.475* 10/15/2015 1117   HCO3 27.4* 10/15/2015 1117   TCO2 28 10/15/2015 1117   ACIDBASEDEF 3.0* 10/14/2015 0405   O2SAT 92.0 10/15/2015 1117   CBG (last 3)   Recent Labs  10/15/15 0401 10/15/15 0749 10/15/15 1231  GLUCAP 102* 104* 97   CXR: improved  CLINICAL DATA: 69 year old male with coronary artery disease status post CABG  EXAM: PORTABLE CHEST 1 VIEW  COMPARISON: Prior chest x-ray 10/14/2015  FINDINGS: The endotracheal tube is 3.5 cm above the carina. A right IJ vascular sheath convey is a Swan-Ganz catheter into the heart. Tip of the Swan overlies the right main pulmonary artery. Mediastinal and bilateral pleural drains remain present. Patient is status post median sternotomy with evidence of aortic valve replacement.  Slightly improved inspiratory volumes. Persistent atelectasis versus infiltrate in the left lower lobe. No pneumothorax or pulmonary edema. No acute osseous abnormality.  IMPRESSION: 1. Stable and satisfactory support apparatus. 2. Improved inspiratory volumes. 3. Persistent patchy airspace opacity in the left lower lobe likely reflecting atelectasis. 4. No evidence of pneumothorax or pulmonary edema.   Electronically Signed  By: Malachy Moan M.D.  On: 10/15/2015 09:51  Assessment/Plan: S/P Procedure(s) (LRB): CORONARY ARTERY BYPASS GRAFTING (CABG) x four using left internal mammary artery and left leg greater saphenous vein harvested endoscopically (N/A) TRANSESOPHAGEAL ECHOCARDIOGRAM (TEE) (N/A) MITRAL VALVE (MV) REPLACEMENT (N/A) TRICUSPID VALVE REPAIR (N/A)  He is hemodynamically stable on milrinone 0.25, dop 3 and neo. Cardiac index good. Once extubated will wean milrinone first.  He was weaned to extubate this am but then received some sedation before weaning parameters  could be done so we are waiting for that to wear off. Precedex is off now.  Volume excess: -2600 cc yesterday and weight down 7 lbs. Still 10 lbs over preop. Will continue lasix drip today.  Fever and leukocytosis: may be due to aspiration after he chewed through cuff on ETT and it had to be changed last night. Started on empiric Zosyn then and will add vancomycin. He has some LLL patchy airspace disease that could be atelectasis or pneumonia.   Stage 3 CKD: creat is stable and diuresing well. Repleat K+ as needed.   LOS: 7 days    Alleen BorneBryan K Bartle 10/15/2015

## 2015-10-15 NOTE — Progress Notes (Signed)
Subjective:  POD # 2 CABG X 4. Remains intubated, sedated, paced on high dose neo and lasix drip  Objective:  Temp:  [100.9 F (38.3 C)-101.7 F (38.7 C)] 101.1 F (38.4 C) (12/25 0900) Pulse Rate:  [20-152] 89 (12/25 0900) Resp:  [19-35] 23 (12/25 0900) BP: (71-103)/(50-70) 95/61 mmHg (12/25 0400) SpO2:  [83 %-100 %] 96 % (12/25 0900) Arterial Line BP: (61-148)/(39-70) 90/49 mmHg (12/25 0900) FiO2 (%):  [40 %-100 %] 40 % (12/25 0800) Weight:  [222 lb 3.6 oz (100.8 kg)] 222 lb 3.6 oz (100.8 kg) (12/25 0439) Weight change: -7 lb 4.4 oz (-3.3 kg)  Intake/Output from previous day: 12/24 0701 - 12/25 0700 In: 3725.3 [I.V.:3475.3; IV Piggyback:250] Out: 0051 [Urine:5620; Emesis/NG output:700; Chest Tube:300]  Intake/Output from this shift: Total I/O In: 438.2 [I.V.:258.2; NG/GT:30; IV Piggyback:150] Out: 315 [Urine:250; Emesis/NG output:55; Chest Tube:10]  Physical Exam: General appearance: Intubated and sedated Neck: no adenopathy, no carotid bruit, no JVD, supple, symmetrical, trachea midline and thyroid not enlarged, symmetric, no tenderness/mass/nodules Lungs: clear to auscultation bilaterally Heart: regular rate and rhythm, S1, S2 normal, no murmur, click, rub or gallop Extremities: extremities normal, atraumatic, no cyanosis or edema  Lab Results: Results for orders placed or performed during the hospital encounter of 10/08/15 (from the past 48 hour(s))  I-STAT 3, arterial blood gas (G3+)     Status: Abnormal   Collection Time: 10/13/15 10:14 AM  Result Value Ref Range   pH, Arterial 7.333 (L) 7.350 - 7.450   pCO2 arterial 50.8 (H) 35.0 - 45.0 mmHg   pO2, Arterial 397.0 (H) 80.0 - 100.0 mmHg   Bicarbonate 26.9 (H) 20.0 - 24.0 mEq/L   TCO2 28 0 - 100 mmol/L   O2 Saturation 100.0 %   Acid-Base Excess 1.0 0.0 - 2.0 mmol/L   Patient temperature HIDE    Sample type ARTERIAL   I-STAT, chem 8     Status: Abnormal   Collection Time: 10/13/15 10:20 AM  Result Value  Ref Range   Sodium 138 135 - 145 mmol/L   Potassium 3.5 3.5 - 5.1 mmol/L   Chloride 98 (L) 101 - 111 mmol/L   BUN 32 (H) 6 - 20 mg/dL   Creatinine, Ser 2.00 (H) 0.61 - 1.24 mg/dL   Glucose, Bld 148 (H) 65 - 99 mg/dL   Calcium, Ion 1.04 (L) 1.13 - 1.30 mmol/L   TCO2 28 0 - 100 mmol/L   Hemoglobin 10.2 (L) 13.0 - 17.0 g/dL   HCT 30.0 (L) 39.0 - 52.0 %  I-STAT, chem 8     Status: Abnormal   Collection Time: 10/13/15 11:25 AM  Result Value Ref Range   Sodium 137 135 - 145 mmol/L   Potassium 3.8 3.5 - 5.1 mmol/L   Chloride 101 101 - 111 mmol/L   BUN 31 (H) 6 - 20 mg/dL   Creatinine, Ser 2.20 (H) 0.61 - 1.24 mg/dL   Glucose, Bld 165 (H) 65 - 99 mg/dL   Calcium, Ion 1.03 (L) 1.13 - 1.30 mmol/L   TCO2 27 0 - 100 mmol/L   Hemoglobin 9.9 (L) 13.0 - 17.0 g/dL   HCT 29.0 (L) 39.0 - 52.0 %  I-STAT, chem 8     Status: Abnormal   Collection Time: 10/13/15 12:39 PM  Result Value Ref Range   Sodium 135 135 - 145 mmol/L   Potassium 4.2 3.5 - 5.1 mmol/L   Chloride 100 (L) 101 - 111 mmol/L   BUN 36 (H)  6 - 20 mg/dL   Creatinine, Ser 2.20 (H) 0.61 - 1.24 mg/dL   Glucose, Bld 186 (H) 65 - 99 mg/dL   Calcium, Ion 1.05 (L) 1.13 - 1.30 mmol/L   TCO2 26 0 - 100 mmol/L   Hemoglobin 10.5 (L) 13.0 - 17.0 g/dL   HCT 31.0 (L) 39.0 - 52.0 %  Hemoglobin and hematocrit, blood     Status: Abnormal   Collection Time: 10/13/15  1:32 PM  Result Value Ref Range   Hemoglobin 9.2 (L) 13.0 - 17.0 g/dL    Comment: REPEATED TO VERIFY RESULT CALLED TO, READ BACK BY AND VERIFIED WITH: TICE,M RN @ 1346 10/13/15 LEONARD,A    HCT 27.7 (L) 39.0 - 52.0 %    Comment: REPEATED TO VERIFY RESULT CALLED TO, READ BACK BY AND VERIFIED WITH: TICE,M RN @ 1346 10/13/15 LEONARD,A   Platelet count     Status: Abnormal   Collection Time: 10/13/15  1:32 PM  Result Value Ref Range   Platelets 137 (L) 150 - 400 K/uL    Comment: REPEATED TO VERIFY RESULT CALLED TO, READ BACK BY AND VERIFIED WITH: TICE,M RN @ 1346 10/13/15  LEONARD,A   I-STAT, chem 8     Status: Abnormal   Collection Time: 10/13/15  1:37 PM  Result Value Ref Range   Sodium 133 (L) 135 - 145 mmol/L   Potassium 4.1 3.5 - 5.1 mmol/L   Chloride 99 (L) 101 - 111 mmol/L   BUN 37 (H) 6 - 20 mg/dL   Creatinine, Ser 2.20 (H) 0.61 - 1.24 mg/dL   Glucose, Bld 219 (H) 65 - 99 mg/dL   Calcium, Ion 1.03 (L) 1.13 - 1.30 mmol/L   TCO2 25 0 - 100 mmol/L   Hemoglobin 9.2 (L) 13.0 - 17.0 g/dL   HCT 27.0 (L) 39.0 - 52.0 %  I-STAT 4, (NA,K, GLUC, HGB,HCT)     Status: Abnormal   Collection Time: 10/13/15  4:42 PM  Result Value Ref Range   Sodium 137 135 - 145 mmol/L   Potassium 3.6 3.5 - 5.1 mmol/L   Glucose, Bld 116 (H) 65 - 99 mg/dL   HCT 30.0 (L) 39.0 - 52.0 %   Hemoglobin 10.2 (L) 13.0 - 17.0 g/dL  I-STAT 3, arterial blood gas (G3+)     Status: Abnormal   Collection Time: 10/13/15  4:42 PM  Result Value Ref Range   pH, Arterial 7.286 (L) 7.350 - 7.450   pCO2 arterial 47.9 (H) 35.0 - 45.0 mmHg   pO2, Arterial 47.0 (L) 80.0 - 100.0 mmHg   Bicarbonate 22.8 20.0 - 24.0 mEq/L   TCO2 24 0 - 100 mmol/L   O2 Saturation 77.0 %   Acid-base deficit 4.0 (H) 0.0 - 2.0 mmol/L   Patient temperature HIDE    Sample type ARTERIAL   CBC     Status: Abnormal   Collection Time: 10/13/15  4:50 PM  Result Value Ref Range   WBC 15.2 (H) 4.0 - 10.5 K/uL   RBC 3.57 (L) 4.22 - 5.81 MIL/uL   Hemoglobin 10.2 (L) 13.0 - 17.0 g/dL   HCT 30.8 (L) 39.0 - 52.0 %   MCV 86.3 78.0 - 100.0 fL   MCH 28.6 26.0 - 34.0 pg   MCHC 33.1 30.0 - 36.0 g/dL   RDW 16.5 (H) 11.5 - 15.5 %   Platelets 149 (L) 150 - 400 K/uL  Protime-INR     Status: Abnormal   Collection Time: 10/13/15  4:50 PM  Result Value Ref Range   Prothrombin Time 19.4 (H) 11.6 - 15.2 seconds   INR 1.63 (H) 0.00 - 1.49  APTT     Status: None   Collection Time: 10/13/15  4:50 PM  Result Value Ref Range   aPTT 34 24 - 37 seconds  Magnesium     Status: None   Collection Time: 10/13/15  5:10 PM  Result Value Ref  Range   Magnesium 2.1 1.7 - 2.4 mg/dL  I-STAT 3, arterial blood gas (G3+)     Status: Abnormal   Collection Time: 10/13/15  5:34 PM  Result Value Ref Range   pH, Arterial 7.291 (L) 7.350 - 7.450   pCO2 arterial 47.3 (H) 35.0 - 45.0 mmHg   pO2, Arterial 61.0 (L) 80.0 - 100.0 mmHg   Bicarbonate 23.0 20.0 - 24.0 mEq/L   TCO2 24 0 - 100 mmol/L   O2 Saturation 89.0 %   Acid-base deficit 4.0 (H) 0.0 - 2.0 mmol/L   Patient temperature 36.2 C    Sample type ARTERIAL   Glucose, capillary     Status: None   Collection Time: 10/13/15  6:03 PM  Result Value Ref Range   Glucose-Capillary 79 65 - 99 mg/dL   Comment 1 Capillary Specimen   Glucose, capillary     Status: None   Collection Time: 10/13/15  6:03 PM  Result Value Ref Range   Glucose-Capillary 84 65 - 99 mg/dL   Comment 1 Arterial Specimen   Glucose, capillary     Status: None   Collection Time: 10/13/15  7:06 PM  Result Value Ref Range   Glucose-Capillary 81 65 - 99 mg/dL   Comment 1 Arterial Specimen   Glucose, capillary     Status: Abnormal   Collection Time: 10/13/15  8:10 PM  Result Value Ref Range   Glucose-Capillary 101 (H) 65 - 99 mg/dL   Comment 1 Arterial Specimen   Glucose, capillary     Status: None   Collection Time: 10/13/15  8:29 PM  Result Value Ref Range   Glucose-Capillary 91 65 - 99 mg/dL   Comment 1 Capillary Specimen    Comment 2 Notify RN   I-STAT 3, arterial blood gas (G3+)     Status: Abnormal   Collection Time: 10/13/15  9:54 PM  Result Value Ref Range   pH, Arterial 7.326 (L) 7.350 - 7.450   pCO2 arterial 46.5 (H) 35.0 - 45.0 mmHg   pO2, Arterial 66.0 (L) 80.0 - 100.0 mmHg   Bicarbonate 24.5 (H) 20.0 - 24.0 mEq/L   TCO2 26 0 - 100 mmol/L   O2 Saturation 92.0 %   Acid-base deficit 2.0 0.0 - 2.0 mmol/L   Patient temperature 36.3 C    Sample type ARTERIAL   I-STAT, chem 8     Status: Abnormal   Collection Time: 10/13/15  9:59 PM  Result Value Ref Range   Sodium 136 135 - 145 mmol/L   Potassium  4.0 3.5 - 5.1 mmol/L   Chloride 101 101 - 111 mmol/L   BUN 28 (H) 6 - 20 mg/dL   Creatinine, Ser 2.00 (H) 0.61 - 1.24 mg/dL   Glucose, Bld 143 (H) 65 - 99 mg/dL   Calcium, Ion 1.12 (L) 1.13 - 1.30 mmol/L   TCO2 23 0 - 100 mmol/L   Hemoglobin 10.9 (L) 13.0 - 17.0 g/dL   HCT 32.0 (L) 39.0 - 52.0 %  Creatinine, serum     Status: Abnormal   Collection Time:  10/13/15 10:02 PM  Result Value Ref Range   Creatinine, Ser 2.22 (H) 0.61 - 1.24 mg/dL   GFR calc non Af Amer 28 (L) >60 mL/min   GFR calc Af Amer 33 (L) >60 mL/min    Comment: (NOTE) The eGFR has been calculated using the CKD EPI equation. This calculation has not been validated in all clinical situations. eGFR's persistently <60 mL/min signify possible Chronic Kidney Disease.   Magnesium     Status: None   Collection Time: 10/13/15 10:02 PM  Result Value Ref Range   Magnesium 2.4 1.7 - 2.4 mg/dL  CBC     Status: Abnormal   Collection Time: 10/13/15 10:02 PM  Result Value Ref Range   WBC 17.2 (H) 4.0 - 10.5 K/uL   RBC 3.66 (L) 4.22 - 5.81 MIL/uL   Hemoglobin 10.1 (L) 13.0 - 17.0 g/dL   HCT 31.5 (L) 39.0 - 52.0 %   MCV 86.1 78.0 - 100.0 fL   MCH 27.6 26.0 - 34.0 pg   MCHC 32.1 30.0 - 36.0 g/dL   RDW 16.4 (H) 11.5 - 15.5 %   Platelets 190 150 - 400 K/uL  Glucose, capillary     Status: Abnormal   Collection Time: 10/14/15 12:11 AM  Result Value Ref Range   Glucose-Capillary 170 (H) 65 - 99 mg/dL   Comment 1 Arterial Specimen   Glucose, capillary     Status: Abnormal   Collection Time: 10/14/15  4:03 AM  Result Value Ref Range   Glucose-Capillary 124 (H) 65 - 99 mg/dL   Comment 1 Arterial Specimen   I-STAT 3, arterial blood gas (G3+)     Status: Abnormal   Collection Time: 10/14/15  4:05 AM  Result Value Ref Range   pH, Arterial 7.406 7.350 - 7.450   pCO2 arterial 34.5 (L) 35.0 - 45.0 mmHg   pO2, Arterial 96.0 80.0 - 100.0 mmHg   Bicarbonate 21.5 20.0 - 24.0 mEq/L   TCO2 23 0 - 100 mmol/L   O2 Saturation 97.0 %    Acid-base deficit 3.0 (H) 0.0 - 2.0 mmol/L   Patient temperature 37.7 C    Sample type ARTERIAL   CBC     Status: Abnormal   Collection Time: 10/14/15  4:13 AM  Result Value Ref Range   WBC 12.6 (H) 4.0 - 10.5 K/uL   RBC 3.14 (L) 4.22 - 5.81 MIL/uL   Hemoglobin 8.9 (L) 13.0 - 17.0 g/dL   HCT 26.9 (L) 39.0 - 52.0 %   MCV 85.7 78.0 - 100.0 fL   MCH 28.3 26.0 - 34.0 pg   MCHC 33.1 30.0 - 36.0 g/dL   RDW 16.5 (H) 11.5 - 15.5 %   Platelets 157 150 - 400 K/uL  Basic metabolic panel     Status: Abnormal   Collection Time: 10/14/15  4:13 AM  Result Value Ref Range   Sodium 138 135 - 145 mmol/L   Potassium 4.2 3.5 - 5.1 mmol/L   Chloride 106 101 - 111 mmol/L   CO2 23 22 - 32 mmol/L   Glucose, Bld 133 (H) 65 - 99 mg/dL   BUN 26 (H) 6 - 20 mg/dL   Creatinine, Ser 2.13 (H) 0.61 - 1.24 mg/dL   Calcium 7.8 (L) 8.9 - 10.3 mg/dL   GFR calc non Af Amer 30 (L) >60 mL/min   GFR calc Af Amer 34 (L) >60 mL/min    Comment: (NOTE) The eGFR has been calculated using the CKD EPI equation.  This calculation has not been validated in all clinical situations. eGFR's persistently <60 mL/min signify possible Chronic Kidney Disease.    Anion gap 9 5 - 15  Magnesium     Status: None   Collection Time: 10/14/15  4:13 AM  Result Value Ref Range   Magnesium 2.0 1.7 - 2.4 mg/dL  Glucose, capillary     Status: Abnormal   Collection Time: 10/14/15  8:00 AM  Result Value Ref Range   Glucose-Capillary 124 (H) 65 - 99 mg/dL  Glucose, capillary     Status: Abnormal   Collection Time: 10/14/15 12:12 PM  Result Value Ref Range   Glucose-Capillary 121 (H) 65 - 99 mg/dL   Comment 1 Capillary Specimen    Comment 2 Notify RN   Glucose, capillary     Status: Abnormal   Collection Time: 10/14/15  3:52 PM  Result Value Ref Range   Glucose-Capillary 119 (H) 65 - 99 mg/dL   Comment 1 Capillary Specimen    Comment 2 Notify RN   Magnesium     Status: None   Collection Time: 10/14/15  4:20 PM  Result Value Ref Range    Magnesium 1.9 1.7 - 2.4 mg/dL  CBC     Status: Abnormal   Collection Time: 10/14/15  4:20 PM  Result Value Ref Range   WBC 15.5 (H) 4.0 - 10.5 K/uL   RBC 3.05 (L) 4.22 - 5.81 MIL/uL   Hemoglobin 8.8 (L) 13.0 - 17.0 g/dL   HCT 26.1 (L) 39.0 - 52.0 %   MCV 85.6 78.0 - 100.0 fL   MCH 28.9 26.0 - 34.0 pg   MCHC 33.7 30.0 - 36.0 g/dL   RDW 16.9 (H) 11.5 - 15.5 %   Platelets 167 150 - 400 K/uL  Basic metabolic panel     Status: Abnormal   Collection Time: 10/14/15  4:20 PM  Result Value Ref Range   Sodium 138 135 - 145 mmol/L   Potassium 3.5 3.5 - 5.1 mmol/L   Chloride 105 101 - 111 mmol/L   CO2 25 22 - 32 mmol/L   Glucose, Bld 138 (H) 65 - 99 mg/dL   BUN 27 (H) 6 - 20 mg/dL   Creatinine, Ser 2.25 (H) 0.61 - 1.24 mg/dL   Calcium 8.0 (L) 8.9 - 10.3 mg/dL   GFR calc non Af Amer 28 (L) >60 mL/min   GFR calc Af Amer 32 (L) >60 mL/min    Comment: (NOTE) The eGFR has been calculated using the CKD EPI equation. This calculation has not been validated in all clinical situations. eGFR's persistently <60 mL/min signify possible Chronic Kidney Disease.    Anion gap 8 5 - 15  I-STAT, chem 8     Status: Abnormal   Collection Time: 10/14/15  4:24 PM  Result Value Ref Range   Sodium 139 135 - 145 mmol/L   Potassium 3.5 3.5 - 5.1 mmol/L   Chloride 100 (L) 101 - 111 mmol/L   BUN 25 (H) 6 - 20 mg/dL   Creatinine, Ser 2.10 (H) 0.61 - 1.24 mg/dL   Glucose, Bld 134 (H) 65 - 99 mg/dL   Calcium, Ion 1.13 1.13 - 1.30 mmol/L   TCO2 23 0 - 100 mmol/L   Hemoglobin 8.8 (L) 13.0 - 17.0 g/dL   HCT 26.0 (L) 39.0 - 52.0 %  Glucose, capillary     Status: Abnormal   Collection Time: 10/14/15  7:42 PM  Result Value Ref Range   Glucose-Capillary 111 (  H) 65 - 99 mg/dL   Comment 1 Capillary Specimen    Comment 2 Notify RN   Glucose, capillary     Status: Abnormal   Collection Time: 10/15/15 12:15 AM  Result Value Ref Range   Glucose-Capillary 117 (H) 65 - 99 mg/dL   Comment 1 Capillary Specimen     Comment 2 Notify RN   Glucose, capillary     Status: Abnormal   Collection Time: 10/15/15  4:01 AM  Result Value Ref Range   Glucose-Capillary 102 (H) 65 - 99 mg/dL   Comment 1 Capillary Specimen    Comment 2 Notify RN   Basic metabolic panel     Status: Abnormal   Collection Time: 10/15/15  4:50 AM  Result Value Ref Range   Sodium 138 135 - 145 mmol/L   Potassium 3.2 (L) 3.5 - 5.1 mmol/L   Chloride 103 101 - 111 mmol/L   CO2 26 22 - 32 mmol/L   Glucose, Bld 121 (H) 65 - 99 mg/dL   BUN 28 (H) 6 - 20 mg/dL   Creatinine, Ser 2.26 (H) 0.61 - 1.24 mg/dL   Calcium 7.8 (L) 8.9 - 10.3 mg/dL   GFR calc non Af Amer 27 (L) >60 mL/min   GFR calc Af Amer 32 (L) >60 mL/min    Comment: (NOTE) The eGFR has been calculated using the CKD EPI equation. This calculation has not been validated in all clinical situations. eGFR's persistently <60 mL/min signify possible Chronic Kidney Disease.    Anion gap 9 5 - 15  CBC     Status: Abnormal   Collection Time: 10/15/15  4:50 AM  Result Value Ref Range   WBC 17.3 (H) 4.0 - 10.5 K/uL   RBC 3.06 (L) 4.22 - 5.81 MIL/uL   Hemoglobin 8.9 (L) 13.0 - 17.0 g/dL   HCT 26.4 (L) 39.0 - 52.0 %   MCV 86.3 78.0 - 100.0 fL   MCH 29.1 26.0 - 34.0 pg   MCHC 33.7 30.0 - 36.0 g/dL   RDW 16.9 (H) 11.5 - 15.5 %   Platelets 194 150 - 400 K/uL  I-STAT 3, arterial blood gas (G3+)     Status: Abnormal   Collection Time: 10/15/15  4:54 AM  Result Value Ref Range   pH, Arterial 7.448 7.350 - 7.450   pCO2 arterial 37.7 35.0 - 45.0 mmHg   pO2, Arterial 82.0 80.0 - 100.0 mmHg   Bicarbonate 25.7 (H) 20.0 - 24.0 mEq/L   TCO2 27 0 - 100 mmol/L   O2 Saturation 96.0 %   Acid-Base Excess 2.0 0.0 - 2.0 mmol/L   Patient temperature 38.5 C    Collection site ARTERIAL LINE    Drawn by Operator    Sample type ARTERIAL   Glucose, capillary     Status: Abnormal   Collection Time: 10/15/15  7:49 AM  Result Value Ref Range   Glucose-Capillary 104 (H) 65 - 99 mg/dL   Comment 1  Capillary Specimen    Comment 2 Notify RN     Imaging: Imaging results have been reviewed  Tele- Paced rhythm  Assessment/Plan:   1. Principal Problem: 2.   Acute diastolic (congestive) heart failure ([place]) 3. Active Problems: 4.   Hyperlipidemia 5.   Hypertensive heart disease 6.   Cervical spondylosis with radiculopathy 7.   Stage III chronic kidney disease 8.   Non-STEMI (non-ST elevated myocardial infarction) ([place]) 9.   Demand ischemia ([place]) 10.   Atrial fibrillation with RVR (  [place]) 11.   ACS (acute coronary syndrome) ([place]) 12.   Acute congestive heart failure ([place]) 13.   Tricuspid valve regurgitation 14.   CAD (coronary artery disease) 15.   S/P CABG (coronary artery bypass graft) 16.   S/P CABG x 4 17.   Time Spent Directly with Patient:  15 minutes  Length of Stay:  LOS: 7 days   POD # 2 CABG X 4. Still intubated on precidex, neo and lasix drip. Good UOP. Hemodynamically stable. BPs soft. PAD 17 and C.O 4.6 L/min. Slow progression. CRI with SCr mid 2 range. Weaning per TCTS.  Quay Burow 10/15/2015, 9:41 AM

## 2015-10-15 NOTE — Progress Notes (Signed)
  Amiodarone Drug - Drug Interaction Consult Note  Recommendations: Continue current therapy - monitor K Amiodarone is metabolized by the cytochrome P450 system and therefore has the potential to cause many drug interactions. Amiodarone has an average plasma half-life of 50 days (range 20 to 100 days).   There is potential for drug interactions to occur several weeks or months after stopping treatment and the onset of drug interactions may be slow after initiating amiodarone.   [x]  Statins: Increased risk of myopathy. Simvastatin- restrict dose to 20mg  daily. Other statins: counsel patients to report any muscle pain or weakness immediately.  []  Anticoagulants: Amiodarone can increase anticoagulant effect. Consider warfarin dose reduction. Patients should be monitored closely and the dose of anticoagulant altered accordingly, remembering that amiodarone levels take several weeks to stabilize.  []  Antiepileptics: Amiodarone can increase plasma concentration of phenytoin, the dose should be reduced. Note that small changes in phenytoin dose can result in large changes in levels. Monitor patient and counsel on signs of toxicity.  []  Beta blockers: increased risk of bradycardia, AV block and myocardial depression. Sotalol - avoid concomitant use.  []   Calcium channel blockers (diltiazem and verapamil): increased risk of bradycardia, AV block and myocardial depression.  []   Cyclosporine: Amiodarone increases levels of cyclosporine. Reduced dose of cyclosporine is recommended.  []  Digoxin dose should be halved when amiodarone is started.  [x]  Diuretics: increased risk of cardiotoxicity if hypokalemia occurs.  []  Oral hypoglycemic agents (glyburide, glipizide, glimepiride): increased risk of hypoglycemia. Patient's glucose levels should be monitored closely when initiating amiodarone therapy.   []  Drugs that prolong the QT interval:  Torsades de pointes risk may be increased with concurrent use -  avoid if possible.  Monitor QTc, also keep magnesium/potassium WNL if concurrent therapy can't be avoided. Marland Kitchen. Antibiotics: e.g. fluoroquinolones, erythromycin. . Antiarrhythmics: e.g. quinidine, procainamide, disopyramide, sotalol. . Antipsychotics: e.g. phenothiazines, haloperidol.  . Lithium, tricyclic antidepressants, and methadone. Thank Hebert SohoYou,  Derrick Tiegs A Joandry Slagter  10/15/2015 1:21 PM

## 2015-10-15 NOTE — Progress Notes (Signed)
Pharmacy Antibiotic Follow-up Note  Jordan BaldyLarry W [name] is a 69 y.o. year-old male admitted on 10/08/2015.  The patient is currently on day 1 of zosyn for possible aspiration PNA.  Assessment/Plan: Continue with zosyn 3.375 gm iv q8h Add vancomycin 1250 mg q24h for fevers in post-op period  Temp (24hrs), Avg:101.3 F (38.5 C), Min:100.9 F (38.3 C), Max:101.7 F (38.7 C)   Recent Labs Lab 10/13/15 1650 10/13/15 2202 10/14/15 0413 10/14/15 1620 10/15/15 0450  WBC 15.2* 17.2* 12.6* 15.5* 17.3*    Recent Labs Lab 10/13/15 2202 10/14/15 0413 10/14/15 1620 10/14/15 1624 10/15/15 0450  CREATININE 2.22* 2.13* 2.25* 2.10* 2.26*   Estimated Creatinine Clearance: 33.3 mL/min (by C-G formula based on Cr of 2.26).    No Known Allergies  Antimicrobials this admission: Zosyn 12/24>> Vancomycin 12/25>>  Levels/dose changes this admission: None  Microbiology results: MRSA PCR Neg  Thank you for allowing pharmacy to be a part of this patient's care.  Isaac BlissMichael Alexandria Current, PharmD, BCPS, [facility] IncBCCCP Clinical Pharmacist Pager [phone] 10/15/2015 12:57 PM

## 2015-10-15 NOTE — Progress Notes (Signed)
Patient noted to have episodes of SpO2 desaturation attributed to mouth breathing while asleep.  Changed to ventimask at 45% (was on 6lpm [place] prior).  RT will continue to monitor.

## 2015-10-15 NOTE — Progress Notes (Signed)
Subjective:  Patient is now POD #2 CABG- febrile- BP low on pressors- great UOP on lasix - crt stable  Objective Vital signs in last 24 hours: Filed Vitals:   10/15/15 0615 10/15/15 0645 10/15/15 0700 10/15/15 0719  BP:      Pulse: 89 89 89 89  Temp: 101.3 F (38.5 C) 101.3 F (38.5 C) 101.3 F (38.5 C) 101.1 F (38.4 C)  TempSrc:      Resp: Height:      Weight:      SpO2: 99% 99% 99% 95%   Weight change: -3.3 kg (-7 lb 4.4 oz)  Intake/Output Summary (Last 24 hours) at 10/15/15 0749 Last data filed at 10/15/15 0700  Gross per 24 hour  Intake 3556.24 ml  Output   6620 ml  Net -3063.76 ml    Assessment/ Plan: Pt is a 69 y.o. yo male with CKD who was admitted on 10/08/2015 with now need for CABG  Assessment/Plan: 1. CAD- POD #2 CABG- per CTS- still on pretty large dose pressors and vent- but FIo2 is being weaned 2. CKD- stage 3 at baseline- I think as tuned up as possible pre op and was aware of possible risks to kidney function in this setting-  The fact that creatinine is stable and is having great UOP are both very good signs.  Cont diuresis for now 3. Anemia- blood loss from surgery- transfuse as needed 4. HTN/volume- diuresing- lower BP requiring larger dosing of pressors- dont think is volume depleted though- continue diuresis 5. Hypokalemia- repleting per CTS    Chozen Latulippe A    Labs: Basic Metabolic Panel:  Recent Labs Lab 10/11/15 0422  10/13/15 0545  10/14/15 0413 10/14/15 1620 10/14/15 1624 10/15/15 0450  NA 142  < > 140  140  < > 138 138 139 138  K 3.7  < > 3.6  3.6  < > 4.2 3.5 3.5 3.2*  CL 104  < > 103  103  < > 106 105 100* 103  CO2 26  < > 25  25  --  23 25  --  26  GLUCOSE 113*  < > 101*  105*  < > 133* 138* 134* 121*  BUN 42*  < > 35*  35*  < > 26* 27* 25* 28*  CREATININE 2.54*  < > 2.41*  2.44*  < > 2.13* 2.25* 2.10* 2.26*  CALCIUM 8.7*  < > 9.1  9.1  --  7.8* 8.0*  --  7.8*  PHOS 3.3  --  3.8  --   --   --   --    --   < > = values in this interval not displayed. Liver Function Tests:  Recent Labs Lab 10/09/15 0519 10/13/15 0545  AST 41  --   ALT 48  --   ALKPHOS 123  --   BILITOT 1.0  --   PROT 6.6  --   ALBUMIN 3.6 3.1*   No results for input(s): LIPASE, AMYLASE in the last 168 hours. No results for input(s): AMMONIA in the last 168 hours. CBC:  Recent Labs Lab 10/13/15 1650  10/13/15 2202 10/14/15 0413 10/14/15 1620 10/14/15 1624 10/15/15 0450  WBC 15.2*  --  17.2* 12.6* 15.5*  --  17.3*  HGB 10.2*  < > 10.1* 8.9* 8.8* 8.8* 8.9*  HCT 30.8*  < > 31.5* 26.9* 26.1* 26.0* 26.4*  MCV 86.3  --  86.1 85.7 85.6  --  86.3  PLT 149*  --  190 157 167  --  194  < > = values in this interval not displayed. Cardiac Enzymes:  Recent Labs Lab 10/09/15 1032 10/09/15 1433 10/09/15 2054  TROPONINI 8.56* 7.29* 7.01*   CBG:  Recent Labs Lab 10/14/15 1212 10/14/15 1552 10/14/15 1942 10/15/15 0015 10/15/15 0401  GLUCAP 121* 119* 111* 117* 102*    Iron Studies: No results for input(s): IRON, TIBC, TRANSFERRIN, FERRITIN in the last 72 hours. Studies/Results: Dg Chest Port 1 View  10/14/2015  CLINICAL DATA:  Post CABG EXAM: PORTABLE CHEST 1 VIEW COMPARISON:  10/14/2015 FINDINGS: Cardiomediastinal silhouette is stable. Again noted status post CABG. Stable endotracheal tube position with tip 2.6 cm above the carina. Stable NG tube position. Right IJ Swan-Ganz catheter is stable in position. Bilateral chest tubes are unchanged in position. Again noted patchy consolidation left upper lobe and perihilar. Stable patchy infiltrate in right upper lobe. Mediastinal drain is unchanged in position. There is no pneumothorax. Stable cardiac valve prosthesis. IMPRESSION: Stable support apparatus. Again noted patchy consolidations/airspace is in left lung and right upper lobe. Post CABG. Electronically Signed   By: Natasha MeadLiviu  Pop M.D.   On: 10/14/2015 19:20   Dg Chest Port 1 View  10/14/2015  CLINICAL  DATA:  Shortness of breath EXAM: PORTABLE CHEST 1 VIEW COMPARISON:  October 14, 2015 FINDINGS: The heart size and mediastinal contours are stable. Patient status post prior median sternotomy. Cardiac valvular replacement ring is unchanged. There patchy consolidations throughout the left lung unchanged. There is small patchy consolidation of the mid right lung. Bilateral chest tubes, mediastinal drain, Swan-Ganz catheter, endotracheal tube are unchanged. Nasogastric tube is identified probably coiled in the proximal stomach. The visualized skeletal structures are unremarkable. IMPRESSION: Diffuse patchy consolidation of left lung base unchanged compared prior exam. Developing patchy consolidation of right mid lung. Electronically Signed   By: Sherian ReinWei-Chen  Lin M.D.   On: 10/14/2015 09:52   Dg Chest Port 1 View  10/14/2015  CLINICAL DATA:  CABG EXAM: PORTABLE CHEST 1 VIEW COMPARISON:  10/13/2015 FINDINGS: Endotracheal tube remains in good position. NG tube remains in the stomach. Pericardial drain remains in place. Mediastinal drain remains in place. Bilateral chest tubes in place. No pneumothorax. Mitral valve replacement Diffuse bilateral airspace disease shows mild improvement consistent with improving edema. Hypoventilation with decreased lung volume and increased bibasilar atelectasis compared with the prior study. IMPRESSION: Support lines remain in good position.  No pneumothorax Improvement in bilateral pulmonary edema. Hypoventilation with decreased lung volume compared with the prior study. Electronically Signed   By: Marlan Palauharles  Clark M.D.   On: 10/14/2015 09:15   Dg Chest Port 1 View  10/13/2015  CLINICAL DATA:  Status post CABG day 1 EXAM: PORTABLE CHEST 1 VIEW COMPARISON:  Preoperative PA and lateral chest x-ray of October 10, 2015 FINDINGS: The lungs are mildly hypo inflow would inflated. Confluent alveolar opacities are present bilaterally. The hemidiaphragms are sharp. There is no pneumothorax or  significant pleural effusion. The cardiac silhouette is top-normal in size. The pulmonary vascularity is engorged and indistinct. The endotracheal tube tip lies approximately 2.9 cm above the carina. The esophagogastric tube tip projects in the gastric cardia but the proximal port is likely below the GE junction. A mediastinal drain projects to the right of the thoracic spine at approximately the T4 level. A second mediastinal drain lies horizontally at the level of the left hemidiaphragm. The right chest tube tip projects at the level of the posterior aspect of  the fourth rib. The left chest tube projects between the fourth and fifth posterior ribs. The Swan-Ganz catheter tip projects in the proximal right main pulmonary artery. A prosthetic valve ring is present possibly in the tricuspid position. There are 7 intact sternal wires. IMPRESSION: 1. Pulmonary alveolar edema greater on the left than on the right. No pleural effusion or pneumothorax. 2. The support tubes are in reasonable position. Electronically Signed   By: David  [place] M.D.   On: 10/13/2015 16:47   Medications: Infusions: . sodium chloride 10 mL/hr at 10/15/15 0600  . sodium chloride    . sodium chloride 10 mL/hr at 10/15/15 0600  . dexmedetomidine 1 mcg/kg/hr (10/15/15 0655)  . DOPamine 3.006 mcg/kg/min (10/15/15 0600)  . furosemide (LASIX) infusion 10 mg/hr (10/15/15 0657)  . lactated ringers Stopped (10/14/15 0700)  . lactated ringers Stopped (10/13/15 1900)  . milrinone 0.25 mcg/kg/min (10/15/15 0600)  . nitroGLYCERIN Stopped (10/13/15 1630)  . phenylephrine (NEO-SYNEPHRINE) Adult infusion 70 mcg/min (10/15/15 0600)    Scheduled Medications: . acetaminophen  1,000 mg Oral 4 times per day   Or  . acetaminophen (TYLENOL) oral liquid 160 mg/5 mL  1,000 mg Per Tube 4 times per day  . antiseptic oral rinse  7 mL Mouth Rinse QID  . aspirin EC  325 mg Oral Daily   Or  . aspirin  324 mg Per Tube Daily  . bisacodyl  10 mg Oral  Daily   Or  . bisacodyl  10 mg Rectal Daily  . cefUROXime (ZINACEF)  IV  1.5 g Intravenous Q12H  . chlorhexidine gluconate  15 mL Mouth Rinse BID  . docusate sodium  200 mg Oral Daily  . insulin aspart  0-24 Units Subcutaneous 6 times per day  . magnesium sulfate  4 g Intravenous Once  . pantoprazole  40 mg Oral Daily  . piperacillin-tazobactam (ZOSYN)  IV  3.375 g Intravenous 3 times per day  . potassium chloride  10 mEq Intravenous Q1 Hr x 3  . simvastatin  20 mg Oral Daily  . sodium chloride  3 mL Intravenous Q12H    have reviewed scheduled and prn medications.  Physical Exam: General: pale, on vent, sedated Heart: RRR Lungs: CBS bilat Abdomen: distended Extremities: some peripheral edema    10/15/2015,7:49 AM  LOS: 7 days

## 2015-10-16 ENCOUNTER — Inpatient Hospital Stay (HOSPITAL_COMMUNITY): Payer: PPO

## 2015-10-16 DIAGNOSIS — Z951 Presence of aortocoronary bypass graft: Secondary | ICD-10-CM

## 2015-10-16 DIAGNOSIS — I25118 Atherosclerotic heart disease of native coronary artery with other forms of angina pectoris: Secondary | ICD-10-CM

## 2015-10-16 LAB — BASIC METABOLIC PANEL
Anion gap: 10 (ref 5–15)
BUN: 34 mg/dL — AB (ref 6–20)
CALCIUM: 7.9 mg/dL — AB (ref 8.9–10.3)
CO2: 25 mmol/L (ref 22–32)
CREATININE: 2.53 mg/dL — AB (ref 0.61–1.24)
Chloride: 102 mmol/L (ref 101–111)
GFR calc Af Amer: 28 mL/min — ABNORMAL LOW (ref >60)
GFR, EST NON AFRICAN AMERICAN: 24 mL/min — AB (ref >60)
Glucose, Bld: 124 mg/dL — ABNORMAL HIGH (ref 65–99)
POTASSIUM: 3.6 mmol/L (ref 3.5–5.1)
SODIUM: 137 mmol/L (ref 135–145)

## 2015-10-16 LAB — CBC
HCT: 25.2 % — ABNORMAL LOW (ref 39.0–52.0)
Hemoglobin: 8.3 g/dL — ABNORMAL LOW (ref 13.0–17.0)
MCH: 28.4 pg (ref 26.0–34.0)
MCHC: 32.9 g/dL (ref 30.0–36.0)
MCV: 86.3 fL (ref 78.0–100.0)
PLATELETS: 207 10*3/uL (ref 150–400)
RBC: 2.92 MIL/uL — ABNORMAL LOW (ref 4.22–5.81)
RDW: 17.1 % — AB (ref 11.5–15.5)
WBC: 16.2 10*3/uL — AB (ref 4.0–10.5)

## 2015-10-16 LAB — GLUCOSE, CAPILLARY
GLUCOSE-CAPILLARY: 111 mg/dL — AB (ref 65–99)
GLUCOSE-CAPILLARY: 140 mg/dL — AB (ref 65–99)
Glucose-Capillary: 105 mg/dL — ABNORMAL HIGH (ref 65–99)
Glucose-Capillary: 107 mg/dL — ABNORMAL HIGH (ref 65–99)
Glucose-Capillary: 109 mg/dL — ABNORMAL HIGH (ref 65–99)
Glucose-Capillary: 110 mg/dL — ABNORMAL HIGH (ref 65–99)
Glucose-Capillary: 116 mg/dL — ABNORMAL HIGH (ref 65–99)

## 2015-10-16 MED ORDER — FUROSEMIDE 10 MG/ML IJ SOLN
80.0000 mg | Freq: Once | INTRAMUSCULAR | Status: AC
  Administered 2015-10-16: 80 mg via INTRAVENOUS
  Filled 2015-10-16: qty 8

## 2015-10-16 MED ORDER — TRAMADOL HCL 50 MG PO TABS
50.0000 mg | ORAL_TABLET | Freq: Two times a day (BID) | ORAL | Status: DC | PRN
  Administered 2015-10-19 – 2015-10-22 (×4): 50 mg via ORAL
  Filled 2015-10-16 (×4): qty 1

## 2015-10-16 MED ORDER — POTASSIUM CHLORIDE 10 MEQ/50ML IV SOLN
10.0000 meq | INTRAVENOUS | Status: AC
  Administered 2015-10-16 (×3): 10 meq via INTRAVENOUS
  Filled 2015-10-16 (×3): qty 50

## 2015-10-16 MED ORDER — WARFARIN - PHYSICIAN DOSING INPATIENT
Freq: Every day | Status: DC
  Administered 2015-10-21 – 2015-10-24 (×3)

## 2015-10-16 MED ORDER — ENOXAPARIN SODIUM 40 MG/0.4ML ~~LOC~~ SOLN
40.0000 mg | Freq: Every day | SUBCUTANEOUS | Status: DC
  Administered 2015-10-16: 40 mg via SUBCUTANEOUS
  Filled 2015-10-16: qty 0.4

## 2015-10-16 MED ORDER — WARFARIN SODIUM 5 MG PO TABS
5.0000 mg | ORAL_TABLET | Freq: Once | ORAL | Status: AC
  Administered 2015-10-16: 5 mg via ORAL
  Filled 2015-10-16: qty 1

## 2015-10-16 NOTE — Progress Notes (Addendum)
3 Days Post-Op Procedure(s) (LRB): CORONARY ARTERY BYPASS GRAFTING (CABG) x four using left internal mammary artery and left leg greater saphenous vein harvested endoscopically (N/A) TRANSESOPHAGEAL ECHOCARDIOGRAM (TEE) (N/A) MITRAL VALVE (MV) REPLACEMENT (N/A) TRICUSPID VALVE REPAIR (N/A) Subjective: Wants something to drink  He had a fairly good night according to nurse  Objective: Vital signs in last 24 hours: Temp:  [98.6 F (37 C)-101.3 F (38.5 C)] 99 F (37.2 C) (12/26 0700) Pulse Rate:  [86-91] 88 (12/26 0700) Cardiac Rhythm:  [-] Atrial paced (12/26 0400) Resp:  [18-40] 33 (12/26 0700) BP: (77-125)/(48-105) 112/69 mmHg (12/26 0700) SpO2:  [86 %-98 %] 96 % (12/26 0700) Arterial Line BP: (50-150)/(41-142) 109/63 mmHg (12/26 0700) FiO2 (%):  [40 %-45 %] 45 % (12/26 0000) Weight:  [103.3 kg (227 lb 11.8 oz)] 103.3 kg (227 lb 11.8 oz) (12/26 0430)  Hemodynamic parameters for last 24 hours: PAP: (33-57)/(12-32) 46/28 mmHg CVP:  [10 mmHg-22 mmHg] 15 mmHg CO:  [4.6 L/min-6 L/min] 5.4 L/min CI:  [2.3 L/min/m2-2.9 L/min/m2] 2.6 L/min/m2  Intake/Output from previous day: 12/25 0701 - 12/26 0700 In: 3761 [I.V.:3031; NG/GT:30; IV Piggyback:700] Out: 2475 [Urine:2150; Emesis/NG output:55; Chest Tube:270] Intake/Output this shift:    General appearance: alert, cooperative and flat affect Neurologic: intact Heart: regular rate and rhythm, S1, S2 normal, no murmur, click, rub or gallop Lungs: rales left lung Abdomen: soft, non-tender; bowel sounds normal; no masses,  no organomegaly Extremities: edema mild Wound: dressing dry  Lab Results:  Recent Labs  10/15/15 0450 10/15/15 1510 10/16/15 0424  WBC 17.3*  --  16.2*  HGB 8.9* 8.8* 8.3*  HCT 26.4* 26.0* 25.2*  PLT 194  --  207   BMET:  Recent Labs  10/15/15 0450 10/15/15 1510 10/16/15 0424  NA 138 137 137  K 3.2* 3.5 3.6  CL 103 98* 102  CO2 26  --  25  GLUCOSE 121* 121* 124*  BUN 28* 29* 34*  CREATININE  2.26* 2.40* 2.53*  CALCIUM 7.8*  --  7.9*    PT/INR:  Recent Labs  10/13/15 1650  LABPROT 19.4*  INR 1.63*   ABG    Component Value Date/Time   PHART 7.515* 10/15/2015 1421   HCO3 24.3* 10/15/2015 1421   TCO2 23 10/15/2015 1510   ACIDBASEDEF 3.0* 10/14/2015 0405   O2SAT 94.0 10/15/2015 1421   CBG (last 3)   Recent Labs  10/15/15 1940 10/15/15 2347 10/16/15 0329  GLUCAP 110* 107* 116*   CXR: left mid lung air space disease. Aeration is otherwise good.  Assessment/Plan: S/P Procedure(s) (LRB): CORONARY ARTERY BYPASS GRAFTING (CABG) x four using left internal mammary artery and left leg greater saphenous vein harvested endoscopically (N/A) TRANSESOPHAGEAL ECHOCARDIOGRAM (TEE) (N/A) MITRAL VALVE (MV) REPLACEMENT (N/A) TRICUSPID VALVE REPAIR (N/A)  He is hemodynamically stable with CI 2.4 on milrinone 0.125 and dop 3, neo 30. Will stop milrinone today. Filling pressures look ok. Wean neo as tolerated.  Pulmonary edema improved on CXR  Possible aspiration pneumonia left lung after he chewed through ETT cuff and tube had to be removed. Continue vanc and zosyn per pharmacy  Stage 3 CKD: creat up a little from yesterday probably due to surgery, hypotension, diuresis. Holding off on further diuresis for now until neo weaned.  New onset atrial fibrillation preop: converted with amio preop but it was stopped in the OR. He had recurrent episode yesterday and amio started. Now sinus with PAC's under pacer. Will continue atrial pacing for now.  Start coumadin today  for mitral valve and atrial fib.  Remove chest tubes, femoral a-line and swan today. Mobilize out of bed.  Work on IS.  History of bipolar disorder: observe.       LOS: 8 days    Alleen BorneBryan K Sravya Grissom 10/16/2015

## 2015-10-16 NOTE — Progress Notes (Signed)
TELEMETRY: Reviewed telemetry  Atrial paced at 90. Underlying rhythm sinus with PACs.  Filed Vitals:   10/16/15 0615 10/16/15 0630 10/16/15 0645 10/16/15 0700  BP:  125/66  112/69  Pulse: 88 89 89 88  Temp: 99.1 F (37.3 C) 99.1 F (37.3 C) 99.1 F (37.3 C) 99 F (37.2 C)  TempSrc:      Resp: 33  Height:      Weight:      SpO2: 95% 96% 95% 96%    Intake/Output Summary (Last 24 hours) at 10/16/15 0713 Last data filed at 10/16/15 0600  Gross per 24 hour  Intake 3760.95 ml  Output   2475 ml  Net 1285.95 ml   Filed Weights   10/14/15 0500 10/15/15 0439 10/16/15 0430  Weight: 104.1 kg (229 lb 8 oz) 100.8 kg (222 lb 3.6 oz) 103.3 kg (227 lb 11.8 oz)    Subjective Feels thirsty. Denies SOB.   Marland Kitchen acetaminophen  1,000 mg Oral 4 times per day   Or  . acetaminophen (TYLENOL) oral liquid 160 mg/5 mL  1,000 mg Per Tube 4 times per day  . antiseptic oral rinse  7 mL Mouth Rinse QID  . aspirin EC  325 mg Oral Daily   Or  . aspirin  324 mg Per Tube Daily  . bisacodyl  10 mg Oral Daily   Or  . bisacodyl  10 mg Rectal Daily  . chlorhexidine gluconate  15 mL Mouth Rinse BID  . docusate sodium  200 mg Oral Daily  . enoxaparin (LOVENOX) injection  40 mg Subcutaneous QHS  . insulin aspart  0-24 Units Subcutaneous 6 times per day  . magnesium sulfate  4 g Intravenous Once  . pantoprazole  40 mg Oral Daily  . piperacillin-tazobactam (ZOSYN)  IV  3.375 g Intravenous 3 times per day  . simvastatin  20 mg Oral Daily  . sodium chloride  3 mL Intravenous Q12H  . vancomycin  1,250 mg Intravenous Q24H   . sodium chloride 10 mL/hr at 10/16/15 0600  . sodium chloride    . sodium chloride 20 mL/hr at 10/16/15 0600  . amiodarone 30 mg/hr (10/16/15 0600)  . DOPamine 3.006 mcg/kg/min (10/16/15 0600)  . lactated ringers Stopped (10/14/15 0700)  . lactated ringers Stopped (10/13/15 1900)  . milrinone 0.125 mcg/kg/min (10/16/15 0600)  . phenylephrine (NEO-SYNEPHRINE) Adult infusion  30 mcg/min (10/16/15 0615)    LABS: Basic Metabolic Panel:  Recent Labs  16/10/96 0413 10/14/15 1620  10/15/15 0450 10/15/15 1510 10/16/15 0424  NA 138 138  < > 138 137 137  K 4.2 3.5  < > 3.2* 3.5 3.6  CL 106 105  < > 103 98* 102  CO2 23 25  --  26  --  25  GLUCOSE 133* 138*  < > 121* 121* 124*  BUN 26* 27*  < > 28* 29* 34*  CREATININE 2.13* 2.25*  < > 2.26* 2.40* 2.53*  CALCIUM 7.8* 8.0*  --  7.8*  --  7.9*  MG 2.0 1.9  --   --   --   --   < > = values in this interval not displayed. Liver Function Tests: No results for input(s): AST, ALT, ALKPHOS, BILITOT, PROT, ALBUMIN in the last 72 hours. No results for input(s): LIPASE, AMYLASE in the last 72 hours. CBC:  Recent Labs  10/15/15 0450 10/15/15 1510 10/16/15 0424  WBC 17.3*  --  16.2*  HGB 8.9* 8.8* 8.3*  HCT 26.4* 26.0* 25.2*  MCV 86.3  --  86.3  PLT 194  --  207   Cardiac Enzymes: No results for input(s): CKTOTAL, CKMB, CKMBINDEX, TROPONINI in the last 72 hours. BNP: No results for input(s): PROBNP in the last 72 hours. D-Dimer: No results for input(s): DDIMER in the last 72 hours. Hemoglobin A1C: No results for input(s): HGBA1C in the last 72 hours. Fasting Lipid Panel: No results for input(s): CHOL, HDL, LDLCALC, TRIG, CHOLHDL, LDLDIRECT in the last 72 hours. Thyroid Function Tests: No results for input(s): TSH, T4TOTAL, T3FREE, THYROIDAB in the last 72 hours.  Invalid input(s): FREET3   Radiology/Studies:  Dg Chest Port 1 View  10/15/2015  CLINICAL DATA:  69 year old male with coronary artery disease status post CABG EXAM: PORTABLE CHEST 1 VIEW COMPARISON:  Prior chest x-ray 10/14/2015 FINDINGS: The endotracheal tube is 3.5 cm above the carina. A right IJ vascular sheath convey is a Swan-Ganz catheter into the heart. Tip of the Swan overlies the right main pulmonary artery. Mediastinal and bilateral pleural drains remain present. Patient is status post median sternotomy with evidence of aortic valve  replacement. Slightly improved inspiratory volumes. Persistent atelectasis versus infiltrate in the left lower lobe. No pneumothorax or pulmonary edema. No acute osseous abnormality. IMPRESSION: 1. Stable and satisfactory support apparatus. 2. Improved inspiratory volumes. 3. Persistent patchy airspace opacity in the left lower lobe likely reflecting atelectasis. 4. No evidence of pneumothorax or pulmonary edema. Electronically Signed   By: Malachy MoanHeath  McCullough M.D.   On: 10/15/2015 09:51   Dg Chest Port 1 View  10/14/2015  CLINICAL DATA:  Post CABG EXAM: PORTABLE CHEST 1 VIEW COMPARISON:  10/14/2015 FINDINGS: Cardiomediastinal silhouette is stable. Again noted status post CABG. Stable endotracheal tube position with tip 2.6 cm above the carina. Stable NG tube position. Right IJ Swan-Ganz catheter is stable in position. Bilateral chest tubes are unchanged in position. Again noted patchy consolidation left upper lobe and perihilar. Stable patchy infiltrate in right upper lobe. Mediastinal drain is unchanged in position. There is no pneumothorax. Stable cardiac valve prosthesis. IMPRESSION: Stable support apparatus. Again noted patchy consolidations/airspace is in left lung and right upper lobe. Post CABG. Electronically Signed   By: Natasha MeadLiviu  Pop M.D.   On: 10/14/2015 19:20   Dg Chest Port 1 View  10/14/2015  CLINICAL DATA:  Shortness of breath EXAM: PORTABLE CHEST 1 VIEW COMPARISON:  October 14, 2015 FINDINGS: The heart size and mediastinal contours are stable. Patient status post prior median sternotomy. Cardiac valvular replacement ring is unchanged. There patchy consolidations throughout the left lung unchanged. There is small patchy consolidation of the mid right lung. Bilateral chest tubes, mediastinal drain, Swan-Ganz catheter, endotracheal tube are unchanged. Nasogastric tube is identified probably coiled in the proximal stomach. The visualized skeletal structures are unremarkable. IMPRESSION: Diffuse  patchy consolidation of left lung base unchanged compared prior exam. Developing patchy consolidation of right mid lung. Electronically Signed   By: Sherian ReinWei-Chen  Lin M.D.   On: 10/14/2015 09:52   Ecg 10/14/15 shows NSR with nonspecific ST abnormality.  Echo: Study Conclusions  - Left ventricle: The cavity size was normal. Wall thickness was normal. Systolic function was normal. The estimated ejection fraction was in the range of 60% to 65%. - Mitral valve: MV not well seen. Appeas to be eccentric MR directed posteriorly. Cannot see prolaspe. Overall mild to moderate but consider TEE if clinically indicated to further evaluate given poor image qualtity - Left atrium: The atrium was mildly dilated. -  Atrial septum: No defect or patent foramen ovale was identified  Procedures    Right/Left Heart Cath and Coronary Angiography    Conclusion    1. Ost RCA to Prox RCA lesion, 50% stenosed. 2. Prox RCA to Dist RCA lesion, 99% stenosed. 3. RPDA lesion, 99% stenosed. 4. Mid Cx to Dist Cx lesion, 100% stenosed. 5. 1st Mrg lesion, 100% stenosed. 6. LM lesion, 60% stenosed. 7. Prox LAD to Mid LAD lesion, 85% stenosed. 8. Ost 1st Diag lesion, 75% stenosed. 9. Mid LAD to Dist LAD lesion, 70% stenosed.   Severe three-vessel coronary disease including left main. There is total occlusion of the first obtuse marginal, the mid circumflex, and functional occlusion of the large PDA. There is at least 50% obstruction in the mid to distal left main, high-grade diffuse disease up to 80% of the proximal to mid LAD, and segmental mid to distal RCA 95-99% stenosis.  Left ventriculography was not performed. Left ventricular end-diastolic pressure was upper normal with a measured wedge pressure of 11 mmHg high on the LVEDP was measured at 20 mmHg. Normal LVEF by echo this admission.  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 Total of 40 mL contrast used for the  procedure.   RECOMMENDATIONS:   Consider coronary bypass grafting (consult called to his TCTS)  Monitor for acute kidney injury  Gentle IV hydration overnight     Indications    Acute systolic heart failure (HCC) [I50.21 (ICD-10-CM)]   NSTEMI (non-ST elevated myocardial infarction) (HCC) [I21.4 (ICD-10-CM)]   Type 2 diabetes mellitus without complication, without long-term current use of insulin (HCC) [E11.9 (ICD-10-CM)]    Technique and Indications    The right radial area was sterilely prepped and draped. Intravenous sedation with Versed and fentanyl was administered. 1% Xylocaine was infiltrated to achieve local analgesia. A double wall stick with an angiocath was utilized to obtain intra-arterial access. The modified Seldinger technique was used to place a 70F " Slender" sheath in the right radial artery. Coronary angiography was done using 5 F catheters. Right coronary angiography was performed with a JR4. Left ventriculography was done using the JR 4 catheter and hand injection. Left coronary angiography was performed with a JL 3.5 cm.  Right heart catheterization was performed via the left antecubital vein. A previously placed Angiocath was exchanged using double glove technique and a 5 French brachial sheath inserted after 1% Xylocaine local infiltration. A 5 French balloon tipped Theone Murdoch was then used for right heart pressure recording and oximetry sampling. Intermittent fluoroscopic guidance was used. No complications occurred. Estimated blood loss <50 mL. There were no immediate complications during the procedure.    Coronary Findings    Dominance: Co-dominant   Left Main   . LM lesion, 60% stenosed. The lesion is type C Calcified eccentric.     Left Anterior Descending   . Prox LAD to Mid LAD lesion, 85% stenosed. The lesion is type C Calcified eccentric.   . Mid LAD to Dist LAD lesion, 70% stenosed. Diffuse eccentric.   . First Diagonal Branch   The vessel  is small in size.   Suezanne Jacquet 1st Diag lesion, 75% stenosed.   . Second Diagonal Branch   The vessel is small in size.   . Third Diagonal Branch   The vessel is small in size.     Left Circumflex   . Mid Cx to Dist Cx lesion, 100% stenosed.   . First Obtuse Marginal Branch   1st Mrg filled by collaterals  from 1st RPLB.   . 1st Mrg lesion, 100% stenosed.     Right Coronary Artery   . Ost RCA to Prox RCA lesion, 50% stenosed.   . Prox RCA to Dist RCA lesion, 99% stenosed. The lesion is type C Calcified diffuse with heavy thrombus tubular eccentric.   . Right Posterior Descending Artery   RPDA filled by collaterals from Dist LAD.   Marland Kitchen RPDA lesion, 99% stenosed. The lesion is type C.       Right Heart Pressures Elevated LV EDP consistent with volume overload.    Left Heart    Left Ventricle Evaluated by echocardiography    Coronary Diagrams    Diagnostic Diagram            Implants    Name ID Temporary Type Supply   No information to display    PACS Images    Show images for Cardiac catheterization     Link to Procedure Log    Procedure Log      Hemo Data       Most Recent Value   Fick Cardiac Output  3.5 L/min   Fick Cardiac Output Index  1.71 (L/min)/BSA   RA A Wave  9 mmHg   RA V Wave  6 mmHg   RA Mean  5 mmHg   RV Systolic Pressure  31 mmHg   RV Diastolic Pressure  2 mmHg   RV EDP  7 mmHg   PA Systolic Pressure  34 mmHg   PA Diastolic Pressure  21 mmHg   PA Mean  28 mmHg   PW A Wave  13 mmHg   PW V Wave  13 mmHg   PW Mean  11 mmHg   AO Systolic Pressure  80 mmHg   AO Diastolic Pressure  62 mmHg   AO Mean  71 mmHg   LV Systolic Pressure  84 mmHg   LV Diastolic Pressure  7 mmHg   LV EDP  20 mmHg   Arterial Occlusion Pressure Extended Systolic Pressure  77 mmHg   Arterial Occlusion Pressure Extended Diastolic Pressure  56 mmHg   Arterial Occlusion Pressure Extended Mean Pressure  67 mmHg   Left Ventricular Apex  Extended Systolic Pressure  77 mmHg   Left Ventricular Apex Extended Diastolic Pressure  5 mmHg   Left Ventricular Apex Extended EDP Pressure  19 mmHg   QP/QS  1   TPVR Index  16.42 HRUI   TSVR Index  41.63 HRUI   PVR SVR Ratio  0.26   TPVR/TSVR Ratio  0.39      PHYSICAL EXAM General: Well developed, obese, pale Head: Normocephalic, atraumatic, sclera non-icteric, oropharynx is clear Neck: Negative for carotid bruits. JVD not elevated. No adenopathy Lungs: bibasilar crackles.  Heart: RRR S1 S2  rubs, or gallops. Soft 1/6 systolic murmur. Abdomen: Soft, non-tender, non-distended with normoactive bowel sounds. Extremities: 1+ edema.  Distal pedal pulses are 2+ and equal bilaterally. In wrist restraints. Neuro: Alert and oriented X 3. Moves all extremities spontaneously. Psych:  Responds to questions appropriately with a normal affect.   ASSESSMENT AND PLAN: 1. Acute diastolic CHF. Weaning milrinone and neo post CABG. Continue diuresis. Still 7 lbs up from pre op weight. 2. NSTEMI - Severe 3 vessel obstructive CAD by Cath.   S/p CABG on 10/13/15.  3. Atrial fibrillation with RVR. Converted to NSR post CABG but did have some Afib yesterday. On IV amiodarone. A paced. Will need to consider anticoagulation when [facility]  with surgery. Given marginal renal function he is probably not a good candidate for a NOAC. Would consider coumadin.  5. History of gastritis in July 2016 secondary to ASA and Goody's powders. 6. Chronic cevicalgia 7. Sleep apnea.  8. HTN controlled. 9. CKD stage IV. Stable post op.   Present on Admission:  . Acute diastolic (congestive) heart failure (HCC) . Demand ischemia (HCC) . Stage III chronic kidney disease . Cervical spondylosis with radiculopathy . Hypertensive heart disease . Hyperlipidemia . Non-STEMI (non-ST elevated myocardial infarction) (HCC) . Tricuspid valve regurgitation  Signed, Jamelyn Bovard [place], MDFACC 10/16/2015 7:13 AM

## 2015-10-16 NOTE — Progress Notes (Signed)
Dr. Laneta SimmersBartle called to receive update on paitent status, asked for Milrinone gtt to be turned back on

## 2015-10-16 NOTE — Progress Notes (Signed)
Patient ID: Jordan [name], male   DOB: 14-May-1946, 69 y.o.   MRN: [id number]  SICU Evening Rounds:  Hemodynamically stable on dop 3. Neo weaned off.  sats 98% on 45% FM  Wt is 17 lbs over preop. Will diurese some tonight since off neo.  He tolerated liquids today.

## 2015-10-16 NOTE — Progress Notes (Signed)
Subjective:  Patient is now POD #3 CABG- extubate- temp curve down- BP low on pressors but being weaned- good UOP no lasix at present - crt fairly stable  Objective Vital signs in last 24 hours: Filed Vitals:   10/16/15 0615 10/16/15 0630 10/16/15 0645 10/16/15 0700  BP:  125/66  112/69  Pulse: 88 89 89 88  Temp: 99.1 F (37.3 C) 99.1 F (37.3 C) 99.1 F (37.3 C) 99 F (37.2 C)  TempSrc:      Resp: 33  Height:      Weight:      SpO2: 95% 96% 95% 96%   Weight change: 2.5 kg (5 lb 8.2 oz)  Intake/Output Summary (Last 24 hours) at 10/16/15 0736 Last data filed at 10/16/15 0600  Gross per 24 hour  Intake 3760.95 ml  Output   2475 ml  Net 1285.95 ml    Assessment/ Plan: Pt is a 69 y.o. yo male with CKD who was admitted on 10/08/2015 with now need for CABG  Assessment/Plan: 1. CAD- POD #3 CABG- per CTS-  Pressors being weaned and off  vent-  2. CKD- stage 3 at baseline- I think as tuned up as possible pre op and was aware of possible risks to kidney function in this setting-  Now that his creatinine is fairly stable 72 hours out from surgery is a good sign- hopefully will continue to do well from kidney standpoint  3. Anemia- blood loss from surgery- transfuse as needed 4. HTN/volume- responds well to lasix- currently CVP in the teens and pressors being weaned 5. Hypokalemia- repleting per CTS  Renal is going to follow at a distance and watch the labs- but as above stable renal function 72 hours out should be a very good sign.  He seems to respond to lasix if is needed again- call us with specific questions    Mattheu Brodersen A    Labs: Basic Metabolic Panel:  Recent Labs Lab 10/11/15 0422  10/13/15 0545  10/14/15 1620  10/15/15 0450 10/15/15 1510 10/16/15 0424  NA 142  < > 140  140  < > 138  < > 138 137 137  K 3.7  < > 3.6  3.6  < > 3.5  < > 3.2* 3.5 3.6  CL 104  < > 103  103  < > 105  < > 103 98* 102  CO2 26  < > 25  25  < > 25  --  26  --  25   GLUCOSE 113*  < > 101*  105*  < > 138*  < > 121* 121* 124*  BUN 42*  < > 35*  35*  < > 27*  < > 28* 29* 34*  CREATININE 2.54*  < > 2.41*  2.44*  < > 2.25*  < > 2.26* 2.40* 2.53*  CALCIUM 8.7*  < > 9.1  9.1  < > 8.0*  --  7.8*  --  7.9*  PHOS 3.3  --  3.8  --   --   --   --   --   --   < > = values in this interval not displayed. Liver Function Tests:  Recent Labs Lab 10/13/15 0545  ALBUMIN 3.1*   No results for input(s): LIPASE, AMYLASE in the last 168 hours. No results for input(s): AMMONIA in the last 168 hours. CBC:  Recent Labs Lab 10/13/15 2202 10/14/15 0413 10/14/15 1620  10/15/15 0450 10/15/15 1510 10/16/15 0424  WBC  17.2* 12.6* 15.5*  --  17.3*  --  16.2*  HGB 10.1* 8.9* 8.8*  < > 8.9* 8.8* 8.3*  HCT 31.5* 26.9* 26.1*  < > 26.4* 26.0* 25.2*  MCV 86.1 85.7 85.6  --  86.3  --  86.3  PLT 190 157 167  --  194  --  207  < > = values in this interval not displayed. Cardiac Enzymes:  Recent Labs Lab 10/09/15 1032 10/09/15 1433 10/09/15 2054  TROPONINI 8.56* 7.29* 7.01*   CBG:  Recent Labs Lab 10/15/15 1231 10/15/15 1613 10/15/15 1940 10/15/15 2347 10/16/15 0329  GLUCAP 97 116* 110* 107* 116*    Iron Studies: No results for input(s): IRON, TIBC, TRANSFERRIN, FERRITIN in the last 72 hours. Studies/Results: Dg Chest Port 1 View  10/15/2015  CLINICAL DATA:  69 year old male with coronary artery disease status post CABG EXAM: PORTABLE CHEST 1 VIEW COMPARISON:  Prior chest x-ray 10/14/2015 FINDINGS: The endotracheal tube is 3.5 cm above the carina. A right IJ vascular sheath convey is a Swan-Ganz catheter into the heart. Tip of the Swan overlies the right main pulmonary artery. Mediastinal and bilateral pleural drains remain present. Patient is status post median sternotomy with evidence of aortic valve replacement. Slightly improved inspiratory volumes. Persistent atelectasis versus infiltrate in the left lower lobe. No pneumothorax or pulmonary edema. No  acute osseous abnormality. IMPRESSION: 1. Stable and satisfactory support apparatus. 2. Improved inspiratory volumes. 3. Persistent patchy airspace opacity in the left lower lobe likely reflecting atelectasis. 4. No evidence of pneumothorax or pulmonary edema. Electronically Signed   By: Malachy MoanHeath  McCullough M.D.   On: 10/15/2015 09:51   Dg Chest Port 1 View  10/14/2015  CLINICAL DATA:  Post CABG EXAM: PORTABLE CHEST 1 VIEW COMPARISON:  10/14/2015 FINDINGS: Cardiomediastinal silhouette is stable. Again noted status post CABG. Stable endotracheal tube position with tip 2.6 cm above the carina. Stable NG tube position. Right IJ Swan-Ganz catheter is stable in position. Bilateral chest tubes are unchanged in position. Again noted patchy consolidation left upper lobe and perihilar. Stable patchy infiltrate in right upper lobe. Mediastinal drain is unchanged in position. There is no pneumothorax. Stable cardiac valve prosthesis. IMPRESSION: Stable support apparatus. Again noted patchy consolidations/airspace is in left lung and right upper lobe. Post CABG. Electronically Signed   By: Natasha MeadLiviu  Pop M.D.   On: 10/14/2015 19:20   Dg Chest Port 1 View  10/14/2015  CLINICAL DATA:  Shortness of breath EXAM: PORTABLE CHEST 1 VIEW COMPARISON:  October 14, 2015 FINDINGS: The heart size and mediastinal contours are stable. Patient status post prior median sternotomy. Cardiac valvular replacement ring is unchanged. There patchy consolidations throughout the left lung unchanged. There is small patchy consolidation of the mid right lung. Bilateral chest tubes, mediastinal drain, Swan-Ganz catheter, endotracheal tube are unchanged. Nasogastric tube is identified probably coiled in the proximal stomach. The visualized skeletal structures are unremarkable. IMPRESSION: Diffuse patchy consolidation of left lung base unchanged compared prior exam. Developing patchy consolidation of right mid lung. Electronically Signed   By: Sherian ReinWei-Chen   Lin M.D.   On: 10/14/2015 09:52   Medications: Infusions: . sodium chloride 10 mL/hr at 10/16/15 0600  . sodium chloride    . sodium chloride 20 mL/hr at 10/16/15 0600  . amiodarone 30 mg/hr (10/16/15 0600)  . DOPamine 3.006 mcg/kg/min (10/16/15 0600)  . lactated ringers Stopped (10/14/15 0700)  . lactated ringers Stopped (10/13/15 1900)  . phenylephrine (NEO-SYNEPHRINE) Adult infusion 30 mcg/min (10/16/15 0615)  Scheduled Medications: . antiseptic oral rinse  7 mL Mouth Rinse QID  . aspirin EC  325 mg Oral Daily   Or  . aspirin  324 mg Per Tube Daily  . bisacodyl  10 mg Oral Daily   Or  . bisacodyl  10 mg Rectal Daily  . chlorhexidine gluconate  15 mL Mouth Rinse BID  . docusate sodium  200 mg Oral Daily  . enoxaparin (LOVENOX) injection  40 mg Subcutaneous QHS  . pantoprazole  40 mg Oral Daily  . piperacillin-tazobactam (ZOSYN)  IV  3.375 g Intravenous 3 times per day  . simvastatin  20 mg Oral Daily  . sodium chloride  3 mL Intravenous Q12H  . vancomycin  1,250 mg Intravenous Q24H  . warfarin  5 mg Oral ONCE-1800  . Warfarin - Physician Dosing Inpatient   Does not apply q1800    have reviewed scheduled and prn medications.  Physical Exam: General: pale, on vent, sedated Heart: RRR Lungs: CBS bilat Abdomen: distended Extremities: some peripheral edema    10/16/2015,7:36 AM  LOS: 8 days

## 2015-10-17 ENCOUNTER — Encounter (HOSPITAL_COMMUNITY): Payer: Self-pay | Admitting: Surgery

## 2015-10-17 ENCOUNTER — Inpatient Hospital Stay (HOSPITAL_COMMUNITY): Payer: PPO

## 2015-10-17 DIAGNOSIS — N183 Chronic kidney disease, stage 3 (moderate): Secondary | ICD-10-CM

## 2015-10-17 LAB — CBC
HCT: 25.6 % — ABNORMAL LOW (ref 39.0–52.0)
Hemoglobin: 8.5 g/dL — ABNORMAL LOW (ref 13.0–17.0)
MCH: 28.4 pg (ref 26.0–34.0)
MCHC: 33.2 g/dL (ref 30.0–36.0)
MCV: 85.6 fL (ref 78.0–100.0)
PLATELETS: 287 10*3/uL (ref 150–400)
RBC: 2.99 MIL/uL — AB (ref 4.22–5.81)
RDW: 17.1 % — ABNORMAL HIGH (ref 11.5–15.5)
WBC: 12.1 10*3/uL — AB (ref 4.0–10.5)

## 2015-10-17 LAB — GLUCOSE, CAPILLARY
GLUCOSE-CAPILLARY: 86 mg/dL (ref 65–99)
Glucose-Capillary: 98 mg/dL (ref 65–99)
Glucose-Capillary: 84 mg/dL (ref 65–99)
Glucose-Capillary: 92 mg/dL (ref 65–99)
Glucose-Capillary: 95 mg/dL (ref 65–99)

## 2015-10-17 LAB — BASIC METABOLIC PANEL
Anion gap: 11 (ref 5–15)
BUN: 28 mg/dL — ABNORMAL HIGH (ref 6–20)
CALCIUM: 8 mg/dL — AB (ref 8.9–10.3)
CO2: 25 mmol/L (ref 22–32)
CREATININE: 2.07 mg/dL — AB (ref 0.61–1.24)
Chloride: 101 mmol/L (ref 101–111)
GFR, EST AFRICAN AMERICAN: 35 mL/min — AB (ref >60)
GFR, EST NON AFRICAN AMERICAN: 31 mL/min — AB (ref >60)
Glucose, Bld: 108 mg/dL — ABNORMAL HIGH (ref 65–99)
Potassium: 3.1 mmol/L — ABNORMAL LOW (ref 3.5–5.1)
SODIUM: 137 mmol/L (ref 135–145)

## 2015-10-17 LAB — PROTIME-INR
INR: 1.9 — ABNORMAL HIGH (ref 0.00–1.49)
PROTHROMBIN TIME: 21.7 s — AB (ref 11.6–15.2)

## 2015-10-17 MED ORDER — AMIODARONE HCL 200 MG PO TABS
400.0000 mg | ORAL_TABLET | Freq: Two times a day (BID) | ORAL | Status: DC
  Administered 2015-10-17 – 2015-10-19 (×6): 400 mg via ORAL
  Filled 2015-10-17 (×7): qty 2

## 2015-10-17 MED ORDER — ASPIRIN EC 81 MG PO TBEC
81.0000 mg | DELAYED_RELEASE_TABLET | Freq: Every day | ORAL | Status: DC
  Administered 2015-10-18 – 2015-10-20 (×3): 81 mg via ORAL
  Filled 2015-10-17 (×3): qty 1

## 2015-10-17 MED ORDER — POTASSIUM CHLORIDE CRYS ER 20 MEQ PO TBCR
40.0000 meq | EXTENDED_RELEASE_TABLET | Freq: Once | ORAL | Status: AC
  Administered 2015-10-17: 40 meq via ORAL
  Filled 2015-10-17: qty 2

## 2015-10-17 MED ORDER — FUROSEMIDE 10 MG/ML IJ SOLN
80.0000 mg | Freq: Once | INTRAMUSCULAR | Status: AC
  Administered 2015-10-17: 80 mg via INTRAVENOUS
  Filled 2015-10-17: qty 8

## 2015-10-17 MED ORDER — WARFARIN SODIUM 5 MG PO TABS
5.0000 mg | ORAL_TABLET | Freq: Every day | ORAL | Status: DC
  Administered 2015-10-17: 5 mg via ORAL
  Filled 2015-10-17: qty 1

## 2015-10-17 MED ORDER — SODIUM CHLORIDE 0.9 % IJ SOLN
10.0000 mL | INTRAMUSCULAR | Status: DC | PRN
  Administered 2015-10-21: 20 mL
  Administered 2015-10-22: 10 mL
  Administered 2015-10-23: 20 mL
  Administered 2015-10-24 – 2015-10-25 (×5): 10 mL
  Filled 2015-10-17 (×8): qty 40

## 2015-10-17 MED ORDER — SODIUM CHLORIDE 0.9 % IJ SOLN
10.0000 mL | Freq: Two times a day (BID) | INTRAMUSCULAR | Status: DC
  Administered 2015-10-18 – 2015-10-19 (×2): 10 mL
  Administered 2015-10-20: 20 mL
  Administered 2015-10-24: 10 mL

## 2015-10-17 MED FILL — Sodium Chloride IV Soln 0.9%: INTRAVENOUS | Qty: 2000 | Status: AC

## 2015-10-17 MED FILL — Heparin Sodium (Porcine) Inj 1000 Unit/ML: INTRAMUSCULAR | Qty: 10 | Status: AC

## 2015-10-17 MED FILL — Lidocaine HCl IV Inj 20 MG/ML: INTRAVENOUS | Qty: 5 | Status: AC

## 2015-10-17 MED FILL — Sodium Bicarbonate IV Soln 8.4%: INTRAVENOUS | Qty: 50 | Status: AC

## 2015-10-17 MED FILL — Mannitol IV Soln 20%: INTRAVENOUS | Qty: 500 | Status: AC

## 2015-10-17 MED FILL — Heparin Sodium (Porcine) Inj 1000 Unit/ML: INTRAMUSCULAR | Qty: 30 | Status: AC

## 2015-10-17 MED FILL — Electrolyte-R (PH 7.4) Solution: INTRAVENOUS | Qty: 5000 | Status: AC

## 2015-10-17 NOTE — Progress Notes (Signed)
      [address]       [place] [id number]             [phone]      No complaints at present  BP 110/74 mmHg  Pulse 89  Temp(Src) 98.1 F (36.7 C) (Oral)  Resp 15  Ht 5\' 6"  (1.676 m)  Wt 227 lb 1.2 oz (103 kg)  BMI 36.67 kg/m2  SpO2 92%   Intake/Output Summary (Last 24 hours) at 10/17/15 1649 Last data filed at 10/17/15 1600  Gross per 24 hour  Intake  870.2 ml  Output   3015 ml  Net -2144.8 ml   K= 3.1, supplementation in progress  Creatinine down to 2.07 in setting of good diuresis, c/w resolving ATN  Viviann SpareSteven C. Dorris FetchHendrickson, MD [facility] [phone]

## 2015-10-17 NOTE — Progress Notes (Signed)
Peripherally Inserted Central Catheter/Midline Placement  The IV Nurse has discussed with the patient and/or persons authorized to consent for the patient, the purpose of this procedure and the potential benefits and risks involved with this procedure.  The benefits include less needle sticks, lab draws from the catheter and patient may be discharged home with the catheter.  Risks include, but not limited to, infection, bleeding, blood clot (thrombus formation), and puncture of an artery; nerve damage and irregular heat beat.  Alternatives to this procedure were also discussed.  PICC/Midline Placement Documentation        Jordan [name], [name] 10/17/2015, 1:37 PM

## 2015-10-17 NOTE — Progress Notes (Signed)
IV team called to make aware of pt unable to place picc due to creatine clearance elevated. The only way to place one is to have an order stating he is not a dialysis candidate. Dr Laneta SimmersBartle paged to make aware of issue and ordered was given for IV team to place PICC with elevated creatine clearance and that he was not a dialysis candidate.

## 2015-10-17 NOTE — Progress Notes (Signed)
10/17/2015 4:28 PM Nursing note Lab results called to Dr. Laneta SimmersBartle. Telephone orders received for 40 meq KCL PO x 1 and 80 mg IV lasix x1. Orders enacted and patient and family updated on plan of care.  Shylin Keizer, Blanchard KelchStephanie Ingold

## 2015-10-17 NOTE — Care Management Important Message (Signed)
Important Message  Patient Details  Name: Jordan [name] MRN: [id number] Date of Birth: 02/02/1946   Medicare Important Message Given:  Yes    [name], [name] 10/17/2015, 12:29 PM

## 2015-10-17 NOTE — Progress Notes (Signed)
69 yo WM admitted with NSTEMI, atrial fibrillation with RVR, and acute CHF. 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 At time of surgery found to have ruptured posterior MV papillary muscle with severe MR and TR. S/p CABG with porcine MVR and tricuspid annuloplasty on 10/13/15.   TELEMETRY: Reviewed telemetry  Atrial paced at 90. Underlying rhythm sinus with PACs.  Filed Vitals:   10/17/15 0600 10/17/15 0700 10/17/15 0735 10/17/15 0800  BP: 93/73 101/69  102/63  Pulse: 91 89    Temp:   98.1 F (36.7 C)   TempSrc:   Oral   Resp: 29 16  18   Height:      Weight:      SpO2: 92% 99%      Intake/Output Summary (Last 24 hours) at 10/17/15 0825 Last data filed at 10/17/15 0800  Gross per 24 hour  Intake 1886.03 ml  Output   3135 ml  Net -1248.97 ml   Filed Weights   10/15/15 0439 10/16/15 0430 10/17/15 0500  Weight: 100.8 kg (222 lb 3.6 oz) 103.3 kg (227 lb 11.8 oz) 103 kg (227 lb 1.2 oz)    Subjective Feels weak. Denies dyspnea.   Marland Kitchen amiodarone  400 mg Oral BID  . antiseptic oral rinse  7 mL Mouth Rinse QID  . aspirin EC  325 mg Oral Daily   Or  . aspirin  324 mg Per Tube Daily  . bisacodyl  10 mg Oral Daily   Or  . bisacodyl  10 mg Rectal Daily  . chlorhexidine gluconate  15 mL Mouth Rinse BID  . docusate sodium  200 mg Oral Daily  . enoxaparin (LOVENOX) injection  40 mg Subcutaneous QHS  . pantoprazole  40 mg Oral Daily  . piperacillin-tazobactam (ZOSYN)  IV  3.375 g Intravenous 3 times per day  . simvastatin  20 mg Oral Daily  . sodium chloride  3 mL Intravenous Q12H  . Warfarin - Physician Dosing Inpatient   Does not apply q1800   . sodium chloride Stopped (10/16/15 1100)  . sodium chloride    . sodium chloride 20 mL/hr at 10/16/15 0600  . DOPamine 3 mcg/kg/min (10/17/15 0800)  . lactated ringers Stopped (10/14/15 0700)  . lactated ringers Stopped (10/13/15 1900)  . phenylephrine (NEO-SYNEPHRINE) Adult infusion Stopped (10/16/15 0845)    LABS: Basic Metabolic  Panel:  Recent Labs  10/14/15 1620  10/15/15 0450 10/15/15 1510 10/16/15 0424  NA 138  < > 138 137 137  K 3.5  < > 3.2* 3.5 3.6  CL 105  < > 103 98* 102  CO2 25  --  26  --  25  GLUCOSE 138*  < > 121* 121* 124*  BUN 27*  < > 28* 29* 34*  CREATININE 2.25*  < > 2.26* 2.40* 2.53*  CALCIUM 8.0*  --  7.8*  --  7.9*  MG 1.9  --   --   --   --   < > = values in this interval not displayed. Liver Function Tests: No results for input(s): AST, ALT, ALKPHOS, BILITOT, PROT, ALBUMIN in the last 72 hours. No results for input(s): LIPASE, AMYLASE in the last 72 hours. CBC:  Recent Labs  10/15/15 0450 10/15/15 1510 10/16/15 0424  WBC 17.3*  --  16.2*  HGB 8.9* 8.8* 8.3*  HCT 26.4* 26.0* 25.2*  MCV 86.3  --  86.3  PLT 194  --  207   Cardiac Enzymes: No results for input(s): CKTOTAL, CKMB,  CKMBINDEX, TROPONINI in the last 72 hours. BNP: No results for input(s): PROBNP in the last 72 hours. D-Dimer: No results for input(s): DDIMER in the last 72 hours. Hemoglobin A1C: No results for input(s): HGBA1C in the last 72 hours. Fasting Lipid Panel: No results for input(s): CHOL, HDL, LDLCALC, TRIG, CHOLHDL, LDLDIRECT in the last 72 hours. Thyroid Function Tests: No results for input(s): TSH, T4TOTAL, T3FREE, THYROIDAB in the last 72 hours.  Invalid input(s): FREET3   Radiology/Studies:  Dg Chest Port 1 View  10/17/2015  CLINICAL DATA:  CABG. EXAM: PORTABLE CHEST 1 VIEW COMPARISON:  10/16/2015. FINDINGS: Right IJ sheath in stable position. Interim removal of right and left chest tubes, mediastinal drainage catheter, and Swan-Ganz catheter. Prior CABG and cardiac valve replacement. Prior CABG and cardiac valve replacement. Stable cardiomegaly. Interim improvement of left mid lung field atelectasis and/or infiltrate. Persistent low lung volumes. No pleural effusion or pneumothorax . IMPRESSION: 1. Interim removal of right and left chest tubes, mediastinal drainage catheter, and Swan-Ganz  catheter. Right IJ sheath in stable position. 2. Prior CABG and cardiac valve replacement.  Stable cardiomegaly. 3. Interim improvement of left mid lung field atelectasis and/or infiltrate. Electronically Signed   By: Maisie Fus  Register   On: 10/17/2015 07:38   Dg Chest Port 1 View  10/16/2015  CLINICAL DATA:  Status post CABG 10/13/2015. EXAM: PORTABLE CHEST 1 VIEW COMPARISON:  Single view of the chest 10/15/2015 and 10/14/2015. FINDINGS: The patient has been extubated and NG tube has been removed. Bilateral chest tubes, Swan-Ganz catheter and mediastinal drain remain in place. Focal airspace opacity in the left mid lung is again seen. The right lung is clear. No pneumothorax or pleural effusion is identified. Heart size is mildly enlarged. IMPRESSION: Status post extubation and NG tube removal. No marked change in focal airspace opacity in the left mid lung which could be due to atelectasis or pneumonia. Electronically Signed   By: Drusilla Kanner M.D.   On: 10/16/2015 09:51   Ecg 10/14/15 shows NSR with nonspecific ST abnormality.  Echo: Study Conclusions  - Left ventricle: The cavity size was normal. Wall thickness was normal. Systolic function was normal. The estimated ejection fraction was in the range of 60% to 65%. - Mitral valve: MV not well seen. Appeas to be eccentric MR directed posteriorly. Cannot see prolaspe. Overall mild to moderate but consider TEE if clinically indicated to further evaluate given poor image qualtity - Left atrium: The atrium was mildly dilated. - Atrial septum: No defect or patent foramen ovale was identified  Procedures    Right/Left Heart Cath and Coronary Angiography    Conclusion    1. Ost RCA to Prox RCA lesion, 50% stenosed. 2. Prox RCA to Dist RCA lesion, 99% stenosed. 3. RPDA lesion, 99% stenosed. 4. Mid Cx to Dist Cx lesion, 100% stenosed. 5. 1st Mrg lesion, 100% stenosed. 6. LM lesion, 60% stenosed. 7. Prox LAD to Mid LAD  lesion, 85% stenosed. 8. Ost 1st Diag lesion, 75% stenosed. 9. Mid LAD to Dist LAD lesion, 70% stenosed.   Severe three-vessel coronary disease including left main. There is total occlusion of the first obtuse marginal, the mid circumflex, and functional occlusion of the large PDA. There is at least 50% obstruction in the mid to distal left main, high-grade diffuse disease up to 80% of the proximal to mid LAD, and segmental mid to distal RCA 95-99% stenosis.  Left ventriculography was not performed. Left ventricular end-diastolic pressure was upper normal with a measured  wedge pressure of 11 mmHg high on the LVEDP was measured at 20 mmHg. Normal LVEF by echo this admission.  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 Total of 40 mL contrast used for the procedure.   RECOMMENDATIONS:   Consider coronary bypass grafting (consult called to his TCTS)  Monitor for acute kidney injury  Gentle IV hydration overnight     Indications    Acute systolic heart failure (HCC) [I50.21 (ICD-10-CM)]   NSTEMI (non-ST elevated myocardial infarction) (HCC) [I21.4 (ICD-10-CM)]   Type 2 diabetes mellitus without complication, without long-term current use of insulin (HCC) [E11.9 (ICD-10-CM)]    Technique and Indications    The right radial area was sterilely prepped and draped. Intravenous sedation with Versed and fentanyl was administered. 1% Xylocaine was infiltrated to achieve local analgesia. A double wall stick with an angiocath was utilized to obtain intra-arterial access. The modified Seldinger technique was used to place a 31F " Slender" sheath in the right radial artery. Coronary angiography was done using 5 F catheters. Right coronary angiography was performed with a JR4. Left ventriculography was done using the JR 4 catheter and hand injection. Left coronary angiography was performed with a JL 3.5 cm.  Right heart catheterization was performed via the left antecubital vein. A previously placed  Angiocath was exchanged using double glove technique and a 5 French brachial sheath inserted after 1% Xylocaine local infiltration. A 5 French balloon tipped Theone MurdochSwan Ganz was then used for right heart pressure recording and oximetry sampling. Intermittent fluoroscopic guidance was used. No complications occurred. Estimated blood loss <50 mL. There were no immediate complications during the procedure.    Coronary Findings    Dominance: Co-dominant   Left Main   . LM lesion, 60% stenosed. The lesion is type C Calcified eccentric.     Left Anterior Descending   . Prox LAD to Mid LAD lesion, 85% stenosed. The lesion is type C Calcified eccentric.   . Mid LAD to Dist LAD lesion, 70% stenosed. Diffuse eccentric.   . First Diagonal Branch   The vessel is small in size.   Suezanne Jacquet. Ost 1st Diag lesion, 75% stenosed.   . Second Diagonal Branch   The vessel is small in size.   . Third Diagonal Branch   The vessel is small in size.     Left Circumflex   . Mid Cx to Dist Cx lesion, 100% stenosed.   . First Obtuse Marginal Branch   1st Mrg filled by collaterals from 1st RPLB.   . 1st Mrg lesion, 100% stenosed.     Right Coronary Artery   . Ost RCA to Prox RCA lesion, 50% stenosed.   . Prox RCA to Dist RCA lesion, 99% stenosed. The lesion is type C Calcified diffuse with heavy thrombus tubular eccentric.   . Right Posterior Descending Artery   RPDA filled by collaterals from Dist LAD.   Marland Kitchen. RPDA lesion, 99% stenosed. The lesion is type C.       Right Heart Pressures Elevated LV EDP consistent with volume overload.    Left Heart    Left Ventricle Evaluated by echocardiography    Coronary Diagrams    Diagnostic Diagram            Implants    Name ID Temporary Type Supply   No information to display    PACS Images    Show images for Cardiac catheterization     Link to Procedure Log    Procedure Log  Hemo Data       Most Recent Value   Fick Cardiac Output   3.5 L/min   Fick Cardiac Output Index  1.71 (L/min)/BSA   RA A Wave  9 mmHg   RA V Wave  6 mmHg   RA Mean  5 mmHg   RV Systolic Pressure  31 mmHg   RV Diastolic Pressure  2 mmHg   RV EDP  7 mmHg   PA Systolic Pressure  34 mmHg   PA Diastolic Pressure  21 mmHg   PA Mean  28 mmHg   PW A Wave  13 mmHg   PW V Wave  13 mmHg   PW Mean  11 mmHg   AO Systolic Pressure  80 mmHg   AO Diastolic Pressure  62 mmHg   AO Mean  71 mmHg   LV Systolic Pressure  84 mmHg   LV Diastolic Pressure  7 mmHg   LV EDP  20 mmHg   Arterial Occlusion Pressure Extended Systolic Pressure  77 mmHg   Arterial Occlusion Pressure Extended Diastolic Pressure  56 mmHg   Arterial Occlusion Pressure Extended Mean Pressure  67 mmHg   Left Ventricular Apex Extended Systolic Pressure  77 mmHg   Left Ventricular Apex Extended Diastolic Pressure  5 mmHg   Left Ventricular Apex Extended EDP Pressure  19 mmHg   QP/QS  1   TPVR Index  16.42 HRUI   TSVR Index  41.63 HRUI   PVR SVR Ratio  0.26   TPVR/TSVR Ratio  0.39      PHYSICAL EXAM General: Well developed, obese, pale Head: Normocephalic, atraumatic, sclera non-icteric, oropharynx is clear Neck: Negative for carotid bruits. JVD not elevated. No adenopathy Lungs: bibasilar crackles.  Heart: RRR S1 S2  rubs, or gallops. Soft 1/6 systolic murmur. Abdomen: Soft, non-tender, non-distended with normoactive bowel sounds. Extremities: 1+ edema.  Distal pedal pulses are 2+ and equal bilaterally. In wrist restraints. Neuro: Alert and oriented X 3. Moves all extremities spontaneously. Psych:  Responds to questions appropriately with a normal affect.   ASSESSMENT AND PLAN: 1. Acute diastolic CHF/ Severe MR secondary to papillary muscle rupture. Weaned off milrinone and neo. Still on IV Dopamine for renal perfusion. Continue diuresis. Still 7 lbs up from pre op weight. I/O negative 1350 cc yesterday. I/O negative 5 liters since admission. PA  pressures improved post MVR.  2. NSTEMI - Severe 3 vessel obstructive CAD by Cath.   S/p CABG on 10/13/15.  3. Atrial fibrillation with RVR. Converted to NSR post CABG but did have some Afib post op. Transitioning to oral amiodarone. A paced.  Given marginal renal function he is probably not a good candidate for a NOAC. Would anticoagulate with coumadin. Patient has been reluctant to take anticoagulation in past. Has been in Afib for over one year. Stress compliance with anticoagulation. If unable to tolerate may be a candidate for a Watchman device in future. 5. 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 S/p MVR with tissue prosthesis.  6. History of gastritis in July 2016 secondary to ASA and Goody's powders. 7. Sleep apnea.  8. HTN controlled. 9. CKD stage IV. Stable post op.  10. Chronic cervicalgia.  Present on Admission:  . Acute diastolic (congestive) heart failure (HCC) . Demand ischemia (HCC) . Stage III chronic kidney disease . Cervical spondylosis with radiculopathy . Hypertensive heart disease . Hyperlipidemia . Non-STEMI (non-ST elevated myocardial infarction) (HCC) . Tricuspid valve regurgitation  Signed, Theron Arista  [place], MDFACC 10/17/2015 8:25 AM

## 2015-10-17 NOTE — Progress Notes (Signed)
4 Days Post-Op Procedure(s) (LRB): CORONARY ARTERY BYPASS GRAFTING (CABG) x four using left internal mammary artery and left leg greater saphenous vein harvested endoscopically (N/A) TRANSESOPHAGEAL ECHOCARDIOGRAM (TEE) (N/A) MITRAL VALVE (MV) REPLACEMENT (N/A) TRICUSPID VALVE REPAIR (N/A) Subjective: No complaints  Objective: Vital signs in last 24 hours: Temp:  [97.6 F (36.4 C)-99.5 F (37.5 C)] 98.1 F (36.7 C) (12/27 0735) Pulse Rate:  [87-91] 91 (12/27 0600) Cardiac Rhythm:  [-] Atrial paced (12/27 0000) Resp:  [16-39] 29 (12/27 0600) BP: (93-127)/(62-93) 93/73 mmHg (12/27 0600) SpO2:  [85 %-100 %] 92 % (12/27 0600) Arterial Line BP: (102-137)/(54-116) 102/66 mmHg (12/26 1130) FiO2 (%):  [45 %] 45 % (12/26 2000) Weight:  [103 kg (227 lb 1.2 oz)] 103 kg (227 lb 1.2 oz) (12/27 0500)  Hemodynamic parameters for last 24 hours: PAP: (33-45)/(12-26) 40/22 mmHg CVP:  [13 mmHg-25 mmHg] 13 mmHg CO:  [5 L/min-5.2 L/min] 5 L/min CI:  [2.4 L/min/m2-2.5 L/min/m2] 2.4 L/min/m2  Intake/Output from previous day: 12/26 0701 - 12/27 0700 In: 1915.6 [P.O.:480; I.V.:885.6; IV Piggyback:550] Out: 3265 [Urine:3165; Chest Tube:100] Intake/Output this shift:    General appearance: alert and cooperative Neurologic: intact Heart: regular rate and rhythm, S1, S2 normal, no murmur, click, rub or gallop Lungs: rales left mid lung Abdomen: soft, non-tender; bowel sounds normal, protuberant Extremities: edema moderate Wound: incision ok  Lab Results:  Recent Labs  10/15/15 0450 10/15/15 1510 10/16/15 0424  WBC 17.3*  --  16.2*  HGB 8.9* 8.8* 8.3*  HCT 26.4* 26.0* 25.2*  PLT 194  --  207   BMET:  Recent Labs  10/15/15 0450 10/15/15 1510 10/16/15 0424  NA 138 137 137  K 3.2* 3.5 3.6  CL 103 98* 102  CO2 26  --  25  GLUCOSE 121* 121* 124*  BUN 28* 29* 34*  CREATININE 2.26* 2.40* 2.53*  CALCIUM 7.8*  --  7.9*    PT/INR: No results for input(s): LABPROT, INR in the last 72  hours. ABG    Component Value Date/Time   PHART 7.515* 10/15/2015 1421   HCO3 24.3* 10/15/2015 1421   TCO2 23 10/15/2015 1510   ACIDBASEDEF 3.0* 10/14/2015 0405   O2SAT 94.0 10/15/2015 1421   CBG (last 3)   Recent Labs  10/16/15 2327 10/17/15 0416 10/17/15 0730  GLUCAP 98 84 92   CLINICAL DATA: CABG.  EXAM: PORTABLE CHEST 1 VIEW  COMPARISON: 10/16/2015.  FINDINGS: Right IJ sheath in stable position. Interim removal of right and left chest tubes, mediastinal drainage catheter, and Swan-Ganz catheter. Prior CABG and cardiac valve replacement. Prior CABG and cardiac valve replacement. Stable cardiomegaly. Interim improvement of left mid lung field atelectasis and/or infiltrate. Persistent low lung volumes. No pleural effusion or pneumothorax .  IMPRESSION: 1. Interim removal of right and left chest tubes, mediastinal drainage catheter, and Swan-Ganz catheter. Right IJ sheath in stable position. 2. Prior CABG and cardiac valve replacement. Stable cardiomegaly. 3. Interim improvement of left mid lung field atelectasis and/or infiltrate.   Electronically Signed  By: Maisie Fushomas Register  On: 10/17/2015 07:38   Assessment/Plan: S/P Procedure(s) (LRB): CORONARY ARTERY BYPASS GRAFTING (CABG) x four using left internal mammary artery and left leg greater saphenous vein harvested endoscopically (N/A) TRANSESOPHAGEAL ECHOCARDIOGRAM (TEE) (N/A) MITRAL VALVE (MV) REPLACEMENT (N/A) TRICUSPID VALVE REPAIR (N/A)  He remains hemodynamically stable on dop 3. Rhythm is sinus 60's-70's on amio. Will switch to po. Hold off on beta blocker for now.  Stage 3 chronic kidney disease with acute postop renal  failure. Labs pending this am.  He need a reliable IV and central line will not draw blood so will have PICC inserted. His creat is elevated but I don't see any problem with a PICC for him. His creat will come back down.  Continue anticoagulation for mitral valve and  tricuspid ring.  Possible left lung aspiration pneumonia improved. Continue Zosyn and will stop vanc.  PT consult for help with ambulation. He is severely deconditioned.   LOS: 9 days    [name] 10/17/2015

## 2015-10-18 DIAGNOSIS — I071 Rheumatic tricuspid insufficiency: Secondary | ICD-10-CM

## 2015-10-18 LAB — BASIC METABOLIC PANEL
ANION GAP: 11 (ref 5–15)
BUN: 24 mg/dL — ABNORMAL HIGH (ref 6–20)
CALCIUM: 8 mg/dL — AB (ref 8.9–10.3)
CHLORIDE: 99 mmol/L — AB (ref 101–111)
CO2: 28 mmol/L (ref 22–32)
CREATININE: 2.04 mg/dL — AB (ref 0.61–1.24)
GFR, EST AFRICAN AMERICAN: 36 mL/min — AB (ref >60)
GFR, EST NON AFRICAN AMERICAN: 31 mL/min — AB (ref >60)
Glucose, Bld: 145 mg/dL — ABNORMAL HIGH (ref 65–99)
POTASSIUM: 3.1 mmol/L — AB (ref 3.5–5.1)
Sodium: 138 mmol/L (ref 135–145)

## 2015-10-18 LAB — CBC
HEMATOCRIT: 27.2 % — AB (ref 39.0–52.0)
HEMOGLOBIN: 8.9 g/dL — AB (ref 13.0–17.0)
MCH: 28.5 pg (ref 26.0–34.0)
MCHC: 32.7 g/dL (ref 30.0–36.0)
MCV: 87.2 fL (ref 78.0–100.0)
Platelets: 352 10*3/uL (ref 150–400)
RBC: 3.12 MIL/uL — ABNORMAL LOW (ref 4.22–5.81)
RDW: 16.9 % — AB (ref 11.5–15.5)
WBC: 10.9 10*3/uL — ABNORMAL HIGH (ref 4.0–10.5)

## 2015-10-18 LAB — PROTIME-INR
INR: 3.29 — ABNORMAL HIGH (ref 0.00–1.49)
Prothrombin Time: 32.8 seconds — ABNORMAL HIGH (ref 11.6–15.2)

## 2015-10-18 MED ORDER — POTASSIUM CHLORIDE 20 MEQ PO PACK: 40.0000 meq | PACK | Freq: Once | ORAL | Status: DC

## 2015-10-18 MED ORDER — POTASSIUM CHLORIDE CRYS ER 20 MEQ PO TBCR
40.0000 meq | EXTENDED_RELEASE_TABLET | Freq: Once | ORAL | Status: AC
  Administered 2015-10-18: 40 meq via ORAL
  Filled 2015-10-18: qty 2

## 2015-10-18 MED FILL — Potassium Chloride Inj 2 mEq/ML: INTRAVENOUS | Qty: 40 | Status: AC

## 2015-10-18 MED FILL — Heparin Sodium (Porcine) Inj 1000 Unit/ML: INTRAMUSCULAR | Qty: 30 | Status: AC

## 2015-10-18 MED FILL — Magnesium Sulfate Inj 50%: INTRAMUSCULAR | Qty: 10 | Status: AC

## 2015-10-18 NOTE — Evaluation (Signed)
Physical Therapy Evaluation Patient Details Name: Jordan [name] MRN: [id number] DOB: March 24, 1946 Today's Date: 10/18/2015   History of Present Illness  pt is a 69 y/o male with h/o HTN, bipolar d/o, sleep apnea, CAD, admitted with SOB and chest pain, found to have elevated troponins suggestive of NSTEMI, diastolic HF and Afib with RVR, s/p CABGx4, MVR and tricuspid valve repair.  Clinical Impression  Pt admitted with/for CP, SOB s/p CABG x4.  Pt currently limited functionally due to the problems listed below.  (see problems list.)  Pt will benefit from PT to maximize function and safety to be able to get home safely with available assist of family.     Follow Up Recommendations Home health PT    Equipment Recommendations  Rolling walker with 5" wheels    Recommendations for Other Services       Precautions / Restrictions Precautions Precautions: Fall;Sternal Restrictions Weight Bearing Restrictions: Yes (sternal precautions)      Mobility  Bed Mobility Overal bed mobility: Needs Assistance Bed Mobility: Sit to Supine       Sit to supine: Mod assist;+2 for physical assistance   General bed mobility comments: reinforced sternal precautions based on bed mobility  Transfers Overall transfer level: Needs assistance Equipment used: Rolling walker (2 wheeled) Transfers: Sit to/from UGI CorporationStand;Stand Pivot Transfers Sit to Stand: Min assist Stand pivot transfers: Min assist       General transfer comment: reinforced sternal precautions and transfer safety  Ambulation/Gait Ambulation/Gait assistance: Min assist;+2 safety/equipment Ambulation Distance (Feet): 40 Feet (35 feet after 2-3 minute rest in between) Assistive device: Rolling walker (2 wheeled) Gait Pattern/deviations: Step-through pattern;Decreased step length - right;Decreased step length - left;Decreased stride length Gait velocity: slow   General Gait Details: very short, choppy steps with extra lateral w/shift    Stairs            Wheelchair Mobility    Modified Rankin (Stroke Patients Only)       Balance Overall balance assessment: Needs assistance Sitting-balance support: No upper extremity supported Sitting balance-Leahy Scale: Fair     Standing balance support: Single extremity supported Standing balance-Leahy Scale: Fair Standing balance comment: prefers having UE support                             Pertinent Vitals/Pain Pain Assessment: Faces Faces Pain Scale: Hurts even more Pain Location: sternal Pain Descriptors / Indicators: Aching;Grimacing;Discomfort Pain Intervention(s): Limited activity within patient's tolerance    Home Living Family/patient expects to be discharged to:: Private residence Living Arrangements: Alone Available Help at Discharge: Other (Comment) (going to stay with ex- wife for recovery) Type of Home: House Home Access: Stairs to enter Entrance Stairs-Rails: LawyerLeft;Right Entrance Stairs-Number of Steps: 3 Home Layout: One level Home Equipment: None      Prior Function Level of Independence: Independent               Hand Dominance        Extremity/Trunk Assessment               Lower Extremity Assessment: Overall WFL for tasks assessed (bil general/proximal weakness, hip flex 4-, quads 4, hams 4-)      Cervical / Trunk Assessment: Kyphotic  Communication   Communication: No difficulties  Cognition Arousal/Alertness: Awake/alert Behavior During Therapy: WFL for tasks assessed/performed Overall Cognitive Status: Within Functional Limits for tasks assessed  General Comments General comments (skin integrity, edema, etc.): SpO2 up as high as 95% on 2L, but during gait tends to drop to mid to low 80's with suspect waveform..  EHR in the upper 80's to 90's     Exercises        Assessment/Plan    PT Assessment Patient needs continued PT services  PT Diagnosis Difficulty  walking;Acute pain;Generalized weakness   PT Problem List Decreased strength;Decreased activity tolerance;Decreased balance;Decreased mobility;Decreased knowledge of use of DME;Decreased knowledge of precautions;Pain  PT Treatment Interventions DME instruction;Gait training;Stair training;Functional mobility training;Therapeutic activities;Patient/family education   PT Goals (Current goals can be found in the Care Plan section) Acute Rehab PT Goals Patient Stated Goal: back to independent PT Goal Formulation: With patient Time For Goal Achievement: 11/01/15 Potential to Achieve Goals: Good    Frequency Min 3X/week   Barriers to discharge        Co-evaluation               End of Session Equipment Utilized During Treatment: Oxygen Activity Tolerance: Patient tolerated treatment well;Patient limited by fatigue Patient left: in bed;with call bell/phone within reach;with nursing/sitter in room Nurse Communication: Mobility status         Time: 1610-9604 PT Time Calculation (min) (ACUTE ONLY): 33 min   Charges:   PT Evaluation $Initial PT Evaluation Tier I: 1 Procedure PT Treatments $Gait Training: 8-22 mins   PT G Codes:        Makye Radle, Eliseo Gum 10/18/2015, 11:16 AM  10/18/2015  [place], PT [phone]  (pager)

## 2015-10-18 NOTE — Progress Notes (Signed)
SUBJECTIVE:  Feels weak.  Did ambulate today,  OBJECTIVE:   Vitals:   Filed Vitals:   10/18/15 1215 10/18/15 1230 10/18/15 1245 10/18/15 1300  BP: 110/72 101/76 104/76 102/72  Pulse: 89 89 89 90  Temp:      TempSrc:      Resp: Height:      Weight:      SpO2: 91% 87% 94% 90%   I&O's:   Intake/Output Summary (Last 24 hours) at 10/18/15 1333 Last data filed at 10/18/15 1200  Gross per 24 hour  Intake 1577.9 ml  Output   3495 ml  Net -1917.1 ml   TELEMETRY: Reviewed telemetry pt in A-paced:     PHYSICAL EXAM General: Well developed, well nourished, in no acute distress Head:   Normal cephalic and atramatic  Lungs:   Coarse breath sounds bilaterally to auscultation. Heart:   HRRR S1 S2  No JVD.   Abdomen: abdomen soft and non-tender Msk:  Back normal,  Normal strength and tone for age.    Neuro: Alert and oriented. Psych:  Normal affect, responds appropriately Skin: No rash   LABS: Basic Metabolic Panel:  Recent Labs  16/10/96 1430 10/18/15 0915  NA 137 138  K 3.1* 3.1*  CL 101 99*  CO2 25 28  GLUCOSE 108* 145*  BUN 28* 24*  CREATININE 2.07* 2.04*  CALCIUM 8.0* 8.0*   Liver Function Tests: No results for input(s): AST, ALT, ALKPHOS, BILITOT, PROT, ALBUMIN in the last 72 hours. No results for input(s): LIPASE, AMYLASE in the last 72 hours. CBC:  Recent Labs  10/17/15 1430 10/18/15 0915  WBC 12.1* 10.9*  HGB 8.5* 8.9*  HCT 25.6* 27.2*  MCV 85.6 87.2  PLT 287 352   Cardiac Enzymes: No results for input(s): CKTOTAL, CKMB, CKMBINDEX, TROPONINI in the last 72 hours. BNP: Invalid input(s): POCBNP D-Dimer: No results for input(s): DDIMER in the last 72 hours. Hemoglobin A1C: No results for input(s): HGBA1C in the last 72 hours. Fasting Lipid Panel: No results for input(s): CHOL, HDL, LDLCALC, TRIG, CHOLHDL, LDLDIRECT in the last 72 hours. Thyroid Function Tests: No results for input(s): TSH, T4TOTAL, T3FREE, THYROIDAB in the last  72 hours.  Invalid input(s): FREET3 Anemia Panel: No results for input(s): VITAMINB12, FOLATE, FERRITIN, TIBC, IRON, RETICCTPCT in the last 72 hours. Coag Panel:   Lab Results  Component Value Date   INR 3.29* 10/18/2015   INR 1.90* 10/17/2015   INR 1.63* 10/13/2015    RADIOLOGY: Dg Chest 2 View  10/10/2015  CLINICAL DATA:  CHF, chronic renal insufficiency EXAM: CHEST  2 VIEW COMPARISON:  PA and lateral chest x-ray of October 08, 2015 FINDINGS: The lungs are adequately inflated. The interstitial markings remain increased bilaterally. Areas of confluent alveolar opacity persist in the perihilar regions. There is mild tenting of the left hemidiaphragm. There is no pneumothorax. There are small bilateral pleural effusions blunting the posterior costophrenic angles. The mediastinum is normal in width. The bony thorax exhibits no acute abnormality. IMPRESSION: CHF with mild pulmonary interstitial edema and small bilateral pleural effusions. There has not been significant change since yesterday's study. Electronically Signed   By: David  [place] M.D.   On: 10/10/2015 07:44   Dg Chest 2 View  10/08/2015  CLINICAL DATA:  Shortness of breath and chest pain EXAM: CHEST  2 VIEW COMPARISON:  08/30/2015 FINDINGS: Diffuse interstitial coarsening with fairly symmetric perihilar airspace opacity. There are [employer] best visualized in the lateral  projection, where small pleural effusions are also seen bilaterally. Normal heart size and stable aortic and hilar contours. IMPRESSION: Pulmonary edema and small pleural effusions. Electronically Signed   By: Marnee Spring M.D.   On: 10/08/2015 22:56   Mr Brain Wo Contrast  09/18/2015  CLINICAL DATA:  69 year old hypertensive male with stage 3 kidney disease presenting with episodes of confusion September and October. Last episode 07/28/2015. Episodes occur will coughing. Subsequent encounter. EXAM: MRI HEAD WITHOUT CONTRAST TECHNIQUE: Multiplanar, multiecho  pulse sequences of the brain and surrounding structures were obtained without intravenous contrast. COMPARISON:  07/28/2015 CT.  No comparison MR. FINDINGS: No acute infarct. Mild small vessel disease type changes. Moderate global atrophy without hydrocephalus. No intracranial hemorrhage. No intracranial mass lesion noted on this unenhanced exam. Mild exophthalmos. Mucosal thickening right maxillary sinus with minimal mucosal thickening ethmoid sinus air cells. Minimal transverse ligament hypertrophy otherwise cervical medullary junction unremarkable. Pituitary and pineal region unremarkable. Major intracranial vascular structures are patent. IMPRESSION: No acute infarct. Mild small vessel disease type changes. Moderate global atrophy without hydrocephalus. No intracranial mass lesion noted on this unenhanced exam. Mucosal thickening right maxillary sinus with minimal mucosal thickening ethmoid sinus air cells. Mild exophthalmos. Electronically Signed   By: Lacy Duverney M.D.   On: 09/18/2015 16:11   Dg Chest Port 1 View  10/17/2015  CLINICAL DATA:  CABG. EXAM: PORTABLE CHEST 1 VIEW COMPARISON:  10/16/2015. FINDINGS: Right IJ sheath in stable position. Interim removal of right and left chest tubes, mediastinal drainage catheter, and Swan-Ganz catheter. Prior CABG and cardiac valve replacement. Prior CABG and cardiac valve replacement. Stable cardiomegaly. Interim improvement of left mid lung field atelectasis and/or infiltrate. Persistent low lung volumes. No pleural effusion or pneumothorax . IMPRESSION: 1. Interim removal of right and left chest tubes, mediastinal drainage catheter, and Swan-Ganz catheter. Right IJ sheath in stable position. 2. Prior CABG and cardiac valve replacement.  Stable cardiomegaly. 3. Interim improvement of left mid lung field atelectasis and/or infiltrate. Electronically Signed   By: Maisie Fus  Register   On: 10/17/2015 07:38   Dg Chest Port 1 View  10/16/2015  CLINICAL DATA:   Status post CABG 10/13/2015. EXAM: PORTABLE CHEST 1 VIEW COMPARISON:  Single view of the chest 10/15/2015 and 10/14/2015. FINDINGS: The patient has been extubated and NG tube has been removed. Bilateral chest tubes, Swan-Ganz catheter and mediastinal drain remain in place. Focal airspace opacity in the left mid lung is again seen. The right lung is clear. No pneumothorax or pleural effusion is identified. Heart size is mildly enlarged. IMPRESSION: Status post extubation and NG tube removal. No marked change in focal airspace opacity in the left mid lung which could be due to atelectasis or pneumonia. Electronically Signed   By: Drusilla Kanner M.D.   On: 10/16/2015 09:51   Dg Chest Port 1 View  10/15/2015  CLINICAL DATA:  69 year old male with coronary artery disease status post CABG EXAM: PORTABLE CHEST 1 VIEW COMPARISON:  Prior chest x-ray 10/14/2015 FINDINGS: The endotracheal tube is 3.5 cm above the carina. A right IJ vascular sheath convey is a Swan-Ganz catheter into the heart. Tip of the Swan overlies the right main pulmonary artery. Mediastinal and bilateral pleural drains remain present. Patient is status post median sternotomy with evidence of aortic valve replacement. Slightly improved inspiratory volumes. Persistent atelectasis versus infiltrate in the left lower lobe. No pneumothorax or pulmonary edema. No acute osseous abnormality. IMPRESSION: 1. Stable and satisfactory support apparatus. 2. Improved inspiratory volumes. 3. Persistent  patchy airspace opacity in the left lower lobe likely reflecting atelectasis. 4. No evidence of pneumothorax or pulmonary edema. Electronically Signed   By: Malachy Moan M.D.   On: 10/15/2015 09:51   Dg Chest Port 1 View  10/14/2015  CLINICAL DATA:  Post CABG EXAM: PORTABLE CHEST 1 VIEW COMPARISON:  10/14/2015 FINDINGS: Cardiomediastinal silhouette is stable. Again noted status post CABG. Stable endotracheal tube position with tip 2.6 cm above the carina.  Stable NG tube position. Right IJ Swan-Ganz catheter is stable in position. Bilateral chest tubes are unchanged in position. Again noted patchy consolidation left upper lobe and perihilar. Stable patchy infiltrate in right upper lobe. Mediastinal drain is unchanged in position. There is no pneumothorax. Stable cardiac valve prosthesis. IMPRESSION: Stable support apparatus. Again noted patchy consolidations/airspace is in left lung and right upper lobe. Post CABG. Electronically Signed   By: Natasha Mead M.D.   On: 10/14/2015 19:20   Dg Chest Port 1 View  10/14/2015  CLINICAL DATA:  Shortness of breath EXAM: PORTABLE CHEST 1 VIEW COMPARISON:  October 14, 2015 FINDINGS: The heart size and mediastinal contours are stable. Patient status post prior median sternotomy. Cardiac valvular replacement ring is unchanged. There patchy consolidations throughout the left lung unchanged. There is small patchy consolidation of the mid right lung. Bilateral chest tubes, mediastinal drain, Swan-Ganz catheter, endotracheal tube are unchanged. Nasogastric tube is identified probably coiled in the proximal stomach. The visualized skeletal structures are unremarkable. IMPRESSION: Diffuse patchy consolidation of left lung base unchanged compared prior exam. Developing patchy consolidation of right mid lung. Electronically Signed   By: Sherian Rein M.D.   On: 10/14/2015 09:52   Dg Chest Port 1 View  10/14/2015  CLINICAL DATA:  CABG EXAM: PORTABLE CHEST 1 VIEW COMPARISON:  10/13/2015 FINDINGS: Endotracheal tube remains in good position. NG tube remains in the stomach. Pericardial drain remains in place. Mediastinal drain remains in place. Bilateral chest tubes in place. No pneumothorax. Mitral valve replacement Diffuse bilateral airspace disease shows mild improvement consistent with improving edema. Hypoventilation with decreased lung volume and increased bibasilar atelectasis compared with the prior study. IMPRESSION: Support  lines remain in good position.  No pneumothorax Improvement in bilateral pulmonary edema. Hypoventilation with decreased lung volume compared with the prior study. Electronically Signed   By: Marlan Palau M.D.   On: 10/14/2015 09:15   Dg Chest Port 1 View  10/13/2015  CLINICAL DATA:  Status post CABG day 1 EXAM: PORTABLE CHEST 1 VIEW COMPARISON:  Preoperative PA and lateral chest x-ray of October 10, 2015 FINDINGS: The lungs are mildly hypo inflow would inflated. Confluent alveolar opacities are present bilaterally. The hemidiaphragms are sharp. There is no pneumothorax or significant pleural effusion. The cardiac silhouette is top-normal in size. The pulmonary vascularity is engorged and indistinct. The endotracheal tube tip lies approximately 2.9 cm above the carina. The esophagogastric tube tip projects in the gastric cardia but the proximal port is likely below the GE junction. A mediastinal drain projects to the right of the thoracic spine at approximately the T4 level. A second mediastinal drain lies horizontally at the level of the left hemidiaphragm. The right chest tube tip projects at the level of the posterior aspect of the fourth rib. The left chest tube projects between the fourth and fifth posterior ribs. The Swan-Ganz catheter tip projects in the proximal right main pulmonary artery. A prosthetic valve ring is present possibly in the tricuspid position. There are 7 intact sternal wires. IMPRESSION: 1. Pulmonary  alveolar edema greater on the left than on the right. No pleural effusion or pneumothorax. 2. The support tubes are in reasonable position. Electronically Signed   By: David  [place]azilandJordan M.D.   On: 10/13/2015 16:47      ASSESSMENT: Roosvelt Harps/PLAN:     1) CAD: s/p CABG  2) s/p MVR for ruptured papillary muscle  3) AFib: Paced now,  Underlying NSR per nurse when pacer is checked.  COntinue to follow  4) s/p TR ring for TR, severe pulmonary HTN.  Corky CraftsJayadeep S Varanasi, MD  10/18/2015    1:33 PM

## 2015-10-18 NOTE — Progress Notes (Signed)
Pharmacy Antibiotic Follow-up Note  Jordan [name] is a 69 y.o. year-old male admitted on 10/08/2015.  The patient is currently on day 5 of zosyn for possible aspiration PNA. WBC down to WNL. Afebrile, 12/27 CXR: improvement of L lung atelectasis/infiltrate.   Assessment/Plan: Continue with zosyn 3.375 gm iv q8h   Temp (24hrs), Avg:98 F (36.7 C), Min:97.8 F (36.6 C), Max:98.1 F (36.7 C)   Recent Labs Lab 10/14/15 1620 10/15/15 0450 10/16/15 0424 10/17/15 1430 10/18/15 0915  WBC 15.5* 17.3* 16.2* 12.1* 10.9*     Recent Labs Lab 10/15/15 0450 10/15/15 1510 10/16/15 0424 10/17/15 1430 10/18/15 0915  CREATININE 2.26* 2.40* 2.53* 2.07* 2.04*   Estimated Creatinine Clearance: 36.8 mL/min (by C-G formula based on Cr of 2.04).    No Known Allergies  Antimicrobials this admission: Zosyn 12/24>> Vancomycin 12/25>>12/27  Levels/dose changes this admission: None  Microbiology results: MRSA PCR Neg  Herby AbrahamMichelle T. Evanne Matsunaga, Pharm.D. [phone] 10/18/2015 11:55 AM

## 2015-10-18 NOTE — Progress Notes (Signed)
Nutrition Follow Up  DOCUMENTATION CODES:   Obesity unspecified  INTERVENTION:    No nutrition intervention at this time  NEW NUTRITION DIAGNOSIS:   Increased nutrient needs related to  (post-op healing) as evidenced by estimated needs, ongoing  GOAL:   Patient will meet greater than or equal to 90% of their needs, progressing   MONITOR:   PO intake, Labs, Weight trends, I & O's  ASSESSMENT:   69yo WM admitted for SOB and chest and back pain. Followed by Dr Eliott Nineunham for CKD 3/4 sec to HTN.  Patient s/p procedures: CORONARY ARTERY BYPASS GRAFTING (CABG) x 4 MITRAL VALVE (MV) REPLACEMENT  TRICUSPID VALVE REPAIR  Patient extubated 12/25.  Currently on Heart Healthy diet.  PO intake ~50% per RN.  Patient declined addition of oral nutrition supplements at this time.  Diet Order:  Diet Heart Room service appropriate?: Yes; Fluid consistency:: Thin  Skin:  Reviewed, no issues  Last BM:  12/27  Height:   Ht Readings from Last 1 Encounters:  10/13/15 5\' 6"  (1.676 m)    Weight:   Wt Readings from Last 1 Encounters:  10/18/15 221 lb 1.9 oz (100.3 kg)    Ideal Body Weight:  64.5 kg  BMI:  Body mass index is 35.71 kg/(m^2).  Estimated Nutritional Needs:   Kcal:  1800-2000  Protein:  100-110 gm  Fluid:  per MD  EDUCATION NEEDS:   No education needs identified at this time  Maureen ChattersKatie Araly Kaas, RD, LDN Pager #: [phone] After-Hours Pager #: [phone]

## 2015-10-18 NOTE — Progress Notes (Signed)
CT Surgery PM Rounds  Had a stable day- dopamine weaned off Walked in hall Had BM A- pacing with BP 100/75

## 2015-10-18 NOTE — Progress Notes (Signed)
5 Days Post-Op Procedure(s) (LRB): CORONARY ARTERY BYPASS GRAFTING (CABG) x four using left internal mammary artery and left leg greater saphenous vein harvested endoscopically (N/A) TRANSESOPHAGEAL ECHOCARDIOGRAM (TEE) (N/A) MITRAL VALVE (MV) REPLACEMENT (N/A) TRICUSPID VALVE REPAIR (N/A) Subjective:  No complaints. Walked a little with PT but deconditioned and tires quickly. Had BM yesterday. Eating some.  Objective: Vital signs in last 24 hours: Temp:  [97.8 F (36.6 C)-98.1 F (36.7 C)] 97.8 F (36.6 C) (12/28 0747) Pulse Rate:  [88-110] 90 (12/28 1300) Cardiac Rhythm:  [-] Atrial paced (12/28 1200) Resp:  [15-31] 23 (12/28 1300) BP: (88-131)/(57-102) 102/72 mmHg (12/28 1300) SpO2:  [87 %-100 %] 90 % (12/28 1300) FiO2 (%):  [40 %] 40 % (12/28 0000) Weight:  [100.3 kg (221 lb 1.9 oz)] 100.3 kg (221 lb 1.9 oz) (12/28 0500)  Hemodynamic parameters for last 24 hours:    Intake/Output from previous day: 12/27 0701 - 12/28 0700 In: 1164.3 [P.O.:600; I.V.:414.3; IV Piggyback:150] Out: 3470 [Urine:3470] Intake/Output this shift: Total I/O In: 556.1 [P.O.:480; I.V.:76.1] Out: 320 [Urine:320]  General appearance: alert and cooperative Neurologic: intact Heart: regular rate and rhythm, S1, S2 normal, no murmur, click, rub or gallop Lungs: clear to auscultation bilaterally Abdomen: soft, non-tender; bowel sounds normal; no masses,  no organomegaly Extremities: edema moderate Wound: incisions ok  Lab Results:  Recent Labs  10/17/15 1430 10/18/15 0915  WBC 12.1* 10.9*  HGB 8.5* 8.9*  HCT 25.6* 27.2*  PLT 287 352   BMET:  Recent Labs  10/17/15 1430 10/18/15 0915  NA 137 138  K 3.1* 3.1*  CL 101 99*  CO2 25 28  GLUCOSE 108* 145*  BUN 28* 24*  CREATININE 2.07* 2.04*  CALCIUM 8.0* 8.0*    PT/INR:  Recent Labs  10/18/15 0532  LABPROT 32.8*  INR 3.29*   ABG    Component Value Date/Time   PHART 7.515* 10/15/2015 1421   HCO3 24.3* 10/15/2015 1421   TCO2  23 10/15/2015 1510   ACIDBASEDEF 3.0* 10/14/2015 0405   O2SAT 94.0 10/15/2015 1421   CBG (last 3)   Recent Labs  10/17/15 0730 10/17/15 1233 10/17/15 1633  GLUCAP 92 95 86    Assessment/Plan: S/P Procedure(s) (LRB): CORONARY ARTERY BYPASS GRAFTING (CABG) x four using left internal mammary artery and left leg greater saphenous vein harvested endoscopically (N/A) TRANSESOPHAGEAL ECHOCARDIOGRAM (TEE) (N/A) MITRAL VALVE (MV) REPLACEMENT (N/A) TRICUSPID VALVE REPAIR (N/A)  He is hemodynamically stable. Wean off dopamine. Atrial pacing at 90 to maximize cardiac output but sinus under pacer.  Stage 3 CKD: acute postop renal failure improving. Repleat K+  Volume excess: 9 lbs over preop.  Continue diuresis tomorrow if creat continues to decrease.  INR therapeutic. It jumped up today so hold coumadin tonight..  Continue PT, IS.   LOS: 10 days    Alleen BorneBryan K Trevone Prestwood 10/18/2015

## 2015-10-18 NOTE — Plan of Care (Signed)
Problem: Cardiac: Goal: Ability to maintain an adequate cardiac output will improve Outcome: Progressing Weaning dopamine

## 2015-10-19 ENCOUNTER — Inpatient Hospital Stay (HOSPITAL_COMMUNITY): Payer: PPO

## 2015-10-19 LAB — BASIC METABOLIC PANEL
ANION GAP: 8 (ref 5–15)
BUN: 23 mg/dL — AB (ref 6–20)
CALCIUM: 7.9 mg/dL — AB (ref 8.9–10.3)
CO2: 27 mmol/L (ref 22–32)
CREATININE: 1.97 mg/dL — AB (ref 0.61–1.24)
Chloride: 101 mmol/L (ref 101–111)
GFR calc Af Amer: 38 mL/min — ABNORMAL LOW (ref >60)
GFR, EST NON AFRICAN AMERICAN: 32 mL/min — AB (ref >60)
GLUCOSE: 100 mg/dL — AB (ref 65–99)
Potassium: 3.6 mmol/L (ref 3.5–5.1)
Sodium: 136 mmol/L (ref 135–145)

## 2015-10-19 LAB — PROTIME-INR
INR: 3.99 — ABNORMAL HIGH (ref 0.00–1.49)
Prothrombin Time: 37.9 s — ABNORMAL HIGH (ref 11.6–15.2)

## 2015-10-19 MED ORDER — POTASSIUM CHLORIDE CRYS ER 20 MEQ PO TBCR
40.0000 meq | EXTENDED_RELEASE_TABLET | Freq: Two times a day (BID) | ORAL | Status: DC
  Administered 2015-10-19 (×2): 40 meq via ORAL
  Filled 2015-10-19 (×2): qty 2

## 2015-10-19 MED ORDER — FUROSEMIDE 10 MG/ML IJ SOLN
40.0000 mg | Freq: Two times a day (BID) | INTRAMUSCULAR | Status: DC
  Administered 2015-10-19 (×2): 40 mg via INTRAVENOUS
  Filled 2015-10-19 (×3): qty 4

## 2015-10-19 NOTE — Progress Notes (Signed)
6 Days Post-Op Procedure(s) (LRB): CORONARY ARTERY BYPASS GRAFTING (CABG) x four using left internal mammary artery and left leg greater saphenous vein harvested endoscopically (N/A) TRANSESOPHAGEAL ECHOCARDIOGRAM (TEE) (N/A) MITRAL VALVE (MV) REPLACEMENT (N/A) TRICUSPID VALVE REPAIR (N/A) Subjective:  Had a lot of pain from foley this am but feels better with it out  Objective: Vital signs in last 24 hours: Temp:  [97.7 F (36.5 C)-98.5 F (36.9 C)] 98.4 F (36.9 C) (12/29 0700) Pulse Rate:  [64-110] 89 (12/29 0800) Cardiac Rhythm:  [-] Atrial paced (12/28 2000) Resp:  [15-31] 30 (12/29 0800) BP: (89-125)/(57-106) 102/80 mmHg (12/29 0800) SpO2:  [83 %-100 %] 90 % (12/29 0800) Weight:  [103.6 kg (228 lb 6.3 oz)] 103.6 kg (228 lb 6.3 oz) (12/29 0500)  Hemodynamic parameters for last 24 hours:    Intake/Output from previous day: 12/28 0701 - 12/29 0700 In: 1044.7 [P.O.:720; I.V.:199.7; IV Piggyback:125] Out: 985 [Urine:985] Intake/Output this shift:    General appearance: alert and cooperative Neurologic: intact Heart: regular rate and rhythm, S1, S2 normal, no murmur, click, rub or gallop Lungs: clear to auscultation bilaterally Abdomen: soft, non-tender; bowel sounds normal; no masses,  no organomegaly Extremities: edema mild Wound: incisions ok  Lab Results:  Recent Labs  10/17/15 1430 10/18/15 0915  WBC 12.1* 10.9*  HGB 8.5* 8.9*  HCT 25.6* 27.2*  PLT 287 352   BMET:  Recent Labs  10/18/15 0915 10/19/15 0516  NA 138 136  K 3.1* 3.6  CL 99* 101  CO2 28 27  GLUCOSE 145* 100*  BUN 24* 23*  CREATININE 2.04* 1.97*  CALCIUM 8.0* 7.9*    PT/INR:  Recent Labs  10/19/15 0516  LABPROT 37.9*  INR 3.99*   ABG    Component Value Date/Time   PHART 7.515* 10/15/2015 1421   HCO3 24.3* 10/15/2015 1421   TCO2 23 10/15/2015 1510   ACIDBASEDEF 3.0* 10/14/2015 0405   O2SAT 94.0 10/15/2015 1421   CBG (last 3)   Recent Labs  10/17/15 0730 10/17/15 1233  10/17/15 1633  GLUCAP 92 95 86   CLINICAL DATA: 69 year old male status post CABG, tricuspid valve repair and mitral valve replacement.  EXAM: PORTABLE CHEST 1 VIEW  COMPARISON: Chest x-ray 10/17/2015.  FINDINGS: Lung volumes remain low, but have improved slightly. Improving aeration in the right lung. There continues to be opacities throughout the mid to lower left lung, likely to reflect resolving postoperative subsegmental atelectasis. No confluent consolidative airspace disease. No pleural effusions. No evidence of pulmonary edema. No pneumothorax. Cardiopericardial silhouette is within normal limits for a postop patient. Right upper extremity PICC with tip terminating in superior cavoatrial junction. Status post median sternotomy for CABG, tricuspid annuloplasty and mitral valve replacement (a stented bioprosthesis is noted). Atherosclerosis in the thoracic aorta. Epicardial pacing wires are noted. Orthopedic fixation hardware in the lower cervical spine incidentally noted. Previously noted right IJ Cordis has been removed.  IMPRESSION: 1. Postoperative changes and support apparatus, as above. 2. Although lung volumes remain low, there is improving aeration throughout the lungs bilaterally, with areas of likely resolving postoperative subsegmental atelectasis, as above. 3. Atherosclerosis.   Electronically Signed  By: Trudie Reedaniel Entrikin M.D.  On: 10/19/2015 08:24   Assessment/Plan: S/P Procedure(s) (LRB): CORONARY ARTERY BYPASS GRAFTING (CABG) x four using left internal mammary artery and left leg greater saphenous vein harvested endoscopically (N/A) TRANSESOPHAGEAL ECHOCARDIOGRAM (TEE) (N/A) MITRAL VALVE (MV) REPLACEMENT (N/A) TRICUSPID VALVE REPAIR (N/A)  He is hemodynamically stable in sinus rhythm 74. Continue po amio.  Stage 3 CKD: acute postop renal failure improving. Repleat K+  Volume excess: weight is still 17 lbs over preop. Continue  diuresis.  INR 3.99. No coumadin today. On amiodarone.  Continue ambulation with PT. He is very deconditioned.   LOS: 11 days    Jordan [name] 10/19/2015

## 2015-10-19 NOTE — Progress Notes (Signed)
      [address]       [place] [id number]             [phone]      Resting comfortably, no complaints  BP 97/70 mmHg  Pulse 66  Temp(Src) 98 F (36.7 C) (Oral)  Resp 24  Ht 5\' 6"  (1.676 m)  Wt 228 lb 6.3 oz (103.6 kg)  BMI 36.88 kg/m2  SpO2 100%   Intake/Output Summary (Last 24 hours) at 10/19/15 1800 Last data filed at 10/19/15 1700  Gross per 24 hour  Intake    275 ml  Output   1195 ml  Net   -920 ml    Continue present care  Viviann SpareSteven C. Dorris FetchHendrickson, MD [facility] [phone]

## 2015-10-20 LAB — BASIC METABOLIC PANEL
Anion gap: 10 (ref 5–15)
BUN: 23 mg/dL — AB (ref 6–20)
CALCIUM: 8.2 mg/dL — AB (ref 8.9–10.3)
CHLORIDE: 102 mmol/L (ref 101–111)
CO2: 27 mmol/L (ref 22–32)
CREATININE: 2.24 mg/dL — AB (ref 0.61–1.24)
GFR calc Af Amer: 32 mL/min — ABNORMAL LOW (ref >60)
GFR calc non Af Amer: 28 mL/min — ABNORMAL LOW (ref >60)
Glucose, Bld: 105 mg/dL — ABNORMAL HIGH (ref 65–99)
Potassium: 4.1 mmol/L (ref 3.5–5.1)
SODIUM: 139 mmol/L (ref 135–145)

## 2015-10-20 LAB — GLUCOSE, CAPILLARY: Glucose-Capillary: 104 mg/dL — ABNORMAL HIGH (ref 65–99)

## 2015-10-20 LAB — PROTIME-INR
INR: 3.64 — ABNORMAL HIGH (ref 0.00–1.49)
PROTHROMBIN TIME: 35.4 s — AB (ref 11.6–15.2)

## 2015-10-20 MED ORDER — MOVING RIGHT ALONG BOOK
Freq: Once | Status: AC
  Administered 2015-10-20: 14:00:00
  Filled 2015-10-20: qty 1

## 2015-10-20 MED ORDER — ONDANSETRON HCL 4 MG PO TABS: 4.0000 mg | ORAL_TABLET | Freq: Four times a day (QID) | ORAL | Status: DC | PRN

## 2015-10-20 MED ORDER — ASPIRIN EC 81 MG PO TBEC
81.0000 mg | DELAYED_RELEASE_TABLET | Freq: Every day | ORAL | Status: DC
  Administered 2015-10-21 – 2015-10-25 (×5): 81 mg via ORAL
  Filled 2015-10-20 (×5): qty 1

## 2015-10-20 MED ORDER — SODIUM CHLORIDE 0.9 % IV SOLN: 250.0000 mL | INTRAVENOUS | Status: DC | PRN

## 2015-10-20 MED ORDER — ONDANSETRON HCL 4 MG/2ML IJ SOLN: 4.0000 mg | Freq: Four times a day (QID) | INTRAMUSCULAR | Status: DC | PRN

## 2015-10-20 MED ORDER — WARFARIN VIDEO
Freq: Once | Status: AC
  Administered 2015-10-20: 21:00:00

## 2015-10-20 MED ORDER — AMIODARONE HCL 200 MG PO TABS
200.0000 mg | ORAL_TABLET | Freq: Two times a day (BID) | ORAL | Status: DC
  Administered 2015-10-20 – 2015-10-21 (×4): 200 mg via ORAL
  Filled 2015-10-20 (×4): qty 1

## 2015-10-20 MED ORDER — SODIUM CHLORIDE 0.9 % IJ SOLN: 3.0000 mL | INTRAMUSCULAR | Status: DC | PRN

## 2015-10-20 MED ORDER — OXYCODONE HCL 5 MG PO TABS
5.0000 mg | ORAL_TABLET | ORAL | Status: DC | PRN
  Administered 2015-10-20 – 2015-10-25 (×15): 10 mg via ORAL
  Administered 2015-10-25: 5 mg via ORAL
  Administered 2015-10-25: 10 mg via ORAL
  Filled 2015-10-20: qty 2
  Filled 2015-10-20: qty 1
  Filled 2015-10-20: qty 2
  Filled 2015-10-20: qty 1
  Filled 2015-10-20 (×2): qty 2
  Filled 2015-10-20: qty 1
  Filled 2015-10-20 (×11): qty 2

## 2015-10-20 MED ORDER — FUROSEMIDE 40 MG PO TABS
40.0000 mg | ORAL_TABLET | Freq: Every day | ORAL | Status: DC
  Administered 2015-10-21 – 2015-10-22 (×2): 40 mg via ORAL
  Filled 2015-10-20 (×2): qty 1

## 2015-10-20 MED ORDER — WARFARIN SODIUM 2.5 MG PO TABS
2.5000 mg | ORAL_TABLET | Freq: Once | ORAL | Status: AC
  Administered 2015-10-20: 2.5 mg via ORAL
  Filled 2015-10-20: qty 1

## 2015-10-20 MED ORDER — PANTOPRAZOLE SODIUM 40 MG PO TBEC
40.0000 mg | DELAYED_RELEASE_TABLET | Freq: Every day | ORAL | Status: DC
  Administered 2015-10-21 – 2015-10-25 (×5): 40 mg via ORAL
  Filled 2015-10-20: qty 2
  Filled 2015-10-20 (×4): qty 1

## 2015-10-20 MED ORDER — SODIUM CHLORIDE 0.9 % IJ SOLN
3.0000 mL | Freq: Two times a day (BID) | INTRAMUSCULAR | Status: DC
  Administered 2015-10-24: 3 mL via INTRAVENOUS

## 2015-10-20 MED ORDER — PATIENT'S GUIDE TO USING COUMADIN BOOK
Freq: Once | Status: AC
  Administered 2015-10-20: 18:00:00
  Filled 2015-10-20: qty 1

## 2015-10-20 MED ORDER — DOCUSATE SODIUM 100 MG PO CAPS
200.0000 mg | ORAL_CAPSULE | Freq: Every day | ORAL | Status: DC
  Administered 2015-10-21 – 2015-10-24 (×3): 200 mg via ORAL
  Filled 2015-10-20 (×5): qty 2

## 2015-10-20 NOTE — Discharge Summary (Signed)
Physician Discharge Summary       [address] [place].[address]       [place] [id number]             [phone]    Patient ID: AIKEEM LILLEY MRN: [id number] DOB/AGE: [birth year] 71 y.o.  Admit date: 10/08/2015 Discharge date: 10/25/2015  Principle Diagnoses: 1. Multivessel CAD 2. S/p NSTEMI (non-ST elevated myocardial infarction) (HCC) 3. Severe MR 4. Severe TR 5. Pulmonary edema 6. Atrial fibrillation with RVR (HCC)  Additional Diagnoses:  1. Acute diastolic (congestive) heart failure (HCC) 2. Hyperlipidemia 3. Stage III chronic kidney disease 4. Bipolar disorder (HCC) 5. OSA 6. Hypertension  7. Cervical spondylosis with radiculopathy  Consultants: Nephrology  Procedure (s):  Cardiac catheterization done by Dr. Katrinka Blazing on 10/11/2015: 1. Ost RCA to Prox RCA lesion, 50% stenosed. 2. Prox RCA to Dist RCA lesion, 99% stenosed. 3. RPDA lesion, 99% stenosed. 4. Mid Cx to Dist Cx lesion, 100% stenosed. 5. 1st Mrg lesion, 100% stenosed. 6. LM lesion, 60% stenosed. 7. Prox LAD to Mid LAD lesion, 85% stenosed. 8. Ost 1st Diag lesion, 75% stenosed. 9. Mid LAD to Dist LAD lesion, 70% stenosed.   Severe three-vessel coronary disease including left main. There is total occlusion of the first obtuse marginal, the mid circumflex, and functional occlusion of the large PDA. There is at least 50% obstruction in the mid to distal left main, high-grade diffuse disease up to 80% of the proximal to mid LAD, and segmental mid to distal RCA 95-99% stenosis.  Left ventriculography was not performed. Left ventricular end-diastolic pressure was upper normal with a measured wedge pressure of 11 mmHg high on the LVEDP was measured at 20 mmHg. Normal LVEF by echo this admission.  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 Total of 40 mL contrast used for the procedure.   1.Median Sternotomy 2.Extracorporeal circulation 3. Coronary artery bypass grafting x 4   Left internal mammary graft to the  LAD  SVG to OM  Sequential SVG to distal RCA and PDA  4. Endoscopic vein harvest from the left leg 5. Mitral valve replacement using a 25 mm Edwards Magna-Ease pericardial valve 6. Tricuspid annuloplasty using a 28 mm Edwards MC3 Annuloplasty ring 7. Insertion of right femoral arterial line by Dr. Laneta Simmers on 10/13/2015  History of Presenting Illness: The patient is a 69 year old gentleman with hypertension, stage III CKD with a baseline creat of 1.8 who presented to the [facility] on 12/18 with worsening weakness, shortness of breath, and back and shoulder pain over the past week or so. In the ER troponin was 5 and CXR showed pulmonary edema. An echo showed an EF of 60-65% with mild to moderate MR although mitral valve was not well seen. The aortic valve leaflets were mildly calcified but there was no stenosis. Cardiac cath  showed severe 3-vessel coronary artery disease with 60% distal LM stenosis. He has not had any back or shoulder pain today but was short of breath while eating. Dr. Laneta Simmers was consulted for the consideration of coronary artery bypass grafting surgery. It was discussed with the patient that the TEE (to be done prior to surgery) would further dileniate the severity of mitral regurgitation and tricuspid valve disease. Dr. Laneta Simmers discussed potential risks, benefits, and complications with the patient and he agreed to proceed with surgery. Pre operative carotid duplex showed no significant bilateral carotid artery stenosis bilaterally. He underwent a CABG x 4, mitral valve replacement, tricuspid valve repair, and placement of a right  femoral artery line on 10/13/2015.  Brief Hospital Course:  The patient was extubated on 10/15/2015. It should be noted it was felt he had possible aspiration pneumonia of the left lung after he chewed through ETT cuff and tube had to be removed. As a result, he remained on Vancomycin and Zosyn for a few days post op. He remained afebrile and  hemodynamically stable. He was weaned off of Milrinone, Dopamine, and Neo syneprhine drips. Theone Murdoch, a line, chest tubes, and foley were removed early in the post operative course.He was initially AAI paced  and his BP was somewhat labile for the first several days post op. As a result, he was not put on a BB. He was started on Coumadin and his PT and INR were monitored daily. His INR was elevated post op;however, his INR as of 1/4 was down to 2.27.He was volume over loaded and diuresed, initially with a Lasix drip. He was later given oral Lasix. He has CKD and his creatinine was monitored closely post op. As of 1/4, he is on Lasix 40 mg daily. His last creatine was 2.27 (baseline is likely around 2).He had ABL anemia. He did not require a post op transfusion. His last H and H was 8.7 and 27.8. He has chronic kidney disease. His creatinine was elevated post op but gradually decreased. He was weaned off the insulin drip. The patient's glucose remained well controlled. The patient's HGA1C pre op was 5.6. The patient was felt surgically stable for transfer from the ICU to PCTU for further convalescence on 10/20/2015. He continues to progress with PT and cardiac rehab. He was ambulating on room air. He has been tolerating a diet and has had a bowel movement. Epicardial pacing wires were removed 1/4. We will ask the SNF to remove hest tube sutures on 10/29/2015.He is felt surgically stable for discharge today.  We ask the SNF to please do the following: 1. Please obtain vital signs at least one time daily 2.Please weigh the patient daily. If he or she continues to gain weight or develops lower extremity edema, contact the office at [phone]. 3. Ambulate patient at least three times daily and please use sternal precautions.   Latest Vital Signs: Blood pressure 105/64, pulse 83, temperature 98.2 F (36.8 C), temperature source Oral, resp. rate 18, height  (1.676 m), weight 212 lb 8 oz (96.389 kg),  SpO2 99 %.  Physical Exam: General appearance: alert and cooperative Heart: regular rate and rhythm, S1, S2 normal, no murmur, click, rub or gallop Lungs: rales bibasilar Extremities: edema mild edema in legs Wound: incision ok  Discharge Condition: Stable and discharged to home  Recent laboratory studies:  Lab Results  Component Value Date   WBC 11.0* 10/24/2015   HGB 8.7* 10/24/2015   HCT 27.8* 10/24/2015   MCV 86.1 10/24/2015   PLT 735* 10/24/2015   Lab Results  Component Value Date   NA 134* 10/24/2015   K 3.9 10/24/2015   CL 99* 10/24/2015   CO2 26 10/24/2015   CREATININE 2.27* 10/24/2015   GLUCOSE 102* 10/24/2015    Diagnostic Studies:   Dg Chest Port 1 View  11-14-15  CLINICAL DATA:  69 year old male status post CABG, tricuspid valve repair and mitral valve replacement. EXAM: PORTABLE CHEST 1 VIEW COMPARISON:  Chest x-ray 10/17/2015. FINDINGS: Lung volumes remain low, but have improved slightly. Improving aeration in the right lung. There continues to be opacities throughout the mid to lower left lung, likely to  reflect resolving postoperative subsegmental atelectasis. No confluent consolidative airspace disease. No pleural effusions. No evidence of pulmonary edema. No pneumothorax. Cardiopericardial silhouette is within normal limits for a postop patient. Right upper extremity PICC with tip terminating in superior cavoatrial junction. Status post median sternotomy for CABG, tricuspid annuloplasty and mitral valve replacement (a stented bioprosthesis is noted). Atherosclerosis in the thoracic aorta. Epicardial pacing wires are noted. Orthopedic fixation hardware in the lower cervical spine incidentally noted. Previously noted right IJ Cordis has been removed. IMPRESSION: 1. Postoperative changes and support apparatus, as above. 2. Although lung volumes remain low, there is improving aeration throughout the lungs bilaterally, with areas of likely resolving postoperative  subsegmental atelectasis, as above. 3. Atherosclerosis. Electronically Signed   By: Trudie Reed M.D.   On: 10/19/2015 08:24       Discharge Instructions    Amb Referral to Cardiac Rehabilitation    Complete by:  As directed   Diagnosis:  CABG         Discharge Medications:   Medication List    STOP taking these medications        BC HEADACHE POWDER PO     bisoprolol 5 MG tablet  Commonly known as:  ZEBETA     metoprolol succinate 50 MG 24 hr tablet  Commonly known as:  TOPROL-XL     simvastatin 20 MG tablet  Commonly known as:  ZOCOR      TAKE these medications        amiodarone 200 MG tablet  Commonly known as:  PACERONE  Take 1 tablet (200 mg total) by mouth daily.     aspirin 81 MG EC tablet  Take 1 tablet (81 mg total) by mouth daily.     atorvastatin 10 MG tablet  Commonly known as:  LIPITOR  Take 1 tablet (10 mg total) by mouth daily.     ferrous sulfate 325 (65 FE) MG tablet  Take 1 tablet (325 mg total) by mouth daily with breakfast. For one month then stop.     folic acid 1 MG tablet  Commonly known as:  FOLVITE  Take 1 tablet (1 mg total) by mouth daily. For one month then stop.     furosemide 40 MG tablet  Commonly known as:  LASIX  Take 1 tablet (40 mg total) by mouth daily.     guaiFENesin 600 MG 12 hr tablet  Commonly known as:  MUCINEX  Take 1 tablet (600 mg total) by mouth 2 (two) times daily. May stop after discharge from SNF.     lansoprazole 15 MG capsule  Commonly known as:  PREVACID  Take 1 capsule (15 mg total) by mouth daily at 12 noon.     oxyCODONE 5 MG immediate release tablet  Commonly known as:  Oxy IR/ROXICODONE  Take 1-2 tablets (5-10 mg total) by mouth every 4 (four) hours as needed for severe pain.     warfarin 2.5 MG tablet  Commonly known as:  COUMADIN  Take 1 tablet (2.5 mg total) by mouth daily. Or as directed.       The patient has been discharged on:   1.Beta Blocker:  Yes [   ]                               No   [ x  ]  If No, reason:Bradycardia, labile BP  2.Ace Inhibitor/ARB: Yes [   ]                                     No  [   x ]                                     If No, reason:Elevated creatinine  3.Statin:   Yes [ x  ]                  No  [   ]                  If No, reason:  4.Ecasa:  Yes  [x   ]                  No   [   ]                  If No, reason:  Follow Up Appointments: Follow-up Information    Follow up with Georga HackingW Spencer Tilley, MD On 11/07/2015.   Specialty:  Cardiology   Why:  Call for a follow up appointment for 2 weeks/ pt app is on 11/07/15 @9 :45   Contact information:   [address] [place] [address] [phone]       Follow up with Alleen BorneBryan K Bartle, MD On 11/22/2015.   Specialty:  Cardiothoracic Surgery   Why:  PA/LAT CXR to be taken ([facility] which is in the same building as Dr. Sharee PimpleBartle's office) on 11/22/2015 at 9:45 am;Appointment time is at 10:30 am   Contact information:   [address] [place] [address] [phone]       Follow up with SNF.   Why:  Please draw PT and INR (as is on Coumadin, INR goal 2-2.5) on Friday 10/27/2015. Please call or fax results to Dr. York Spanielilley's office      Follow up with [facility] .   Specialty:  Skilled Nursing Facility   Contact information:   [address] [place] [id number] [phone]      Follow up with SNF.   Why:  Please remove chest tube sutures 10/29/2015      Signed: Sachiko Methot MPA-C 10/25/2015, 11:18 AM

## 2015-10-20 NOTE — Care Management Important Message (Signed)
Important Message  Patient Details  Name: Jordan [name] MRN: [id number] Date of Birth: 07-23-1946   Medicare Important Message Given:  Yes    [name], Brittanie Dosanjh Abena 10/20/2015, 3:37 PM

## 2015-10-20 NOTE — Progress Notes (Signed)
Physical Therapy Treatment Patient Details Name: Jordan [name] MRN: [id number] DOB: 1946-05-23 Today's Date: 10/20/2015    History of Present Illness pt is a 69 y/o male with h/o HTN, bipolar d/o, sleep apnea, CAD, admitted with SOB and chest pain, found to have elevated troponins suggestive of NSTEMI, diastolic HF and Afib with RVR, s/p CABGx4, MVR and tricuspid valve repair.    PT Comments    Pt with improved ambulation tolerance and transfer mobility. Pt con't to present with generalized deconditioning and decreased activity tolerance however is progressing towards goals. Acute PT to con't to follow to progress mobiltiy.   Follow Up Recommendations  Home health PT     Equipment Recommendations  Rolling walker with 5" wheels    Recommendations for Other Services       Precautions / Restrictions Precautions Precautions: Fall;Sternal Precaution Comments: pt unable to recall all precautions, pt re-educated Restrictions Weight Bearing Restrictions: Yes    Mobility  Bed Mobility               General bed mobility comments: pt up in chair  Transfers Overall transfer level: Needs assistance Equipment used: Rolling walker (2 wheeled) Transfers: Sit to/from Stand Sit to Stand: Mod assist         General transfer comment: assist to initiate transfer due to not being able to use UEs  Ambulation/Gait Ambulation/Gait assistance: Min assist Ambulation Distance (Feet): 120 Feet Assistive device: Rolling walker (2 wheeled) Gait Pattern/deviations: Step-through pattern;Decreased stride length Gait velocity: slow   General Gait Details: + SOB, SpO2 at 97% on 1 Lo2 via [place], 3 standign rest breaks   Stairs            Wheelchair Mobility    Modified Rankin (Stroke Patients Only)       Balance Overall balance assessment: Needs assistance         Standing balance support: Single extremity supported Standing balance-Leahy Scale: Fair                      Cognition Arousal/Alertness: Awake/alert Behavior During Therapy: WFL for tasks assessed/performed Overall Cognitive Status: Within Functional Limits for tasks assessed                      Exercises General Exercises - Lower Extremity Ankle Circles/Pumps: AROM;Both;10 reps;Seated Long Arc Quad: AROM;Both;10 reps;Seated Hip Flexion/Marching: AROM;Both;10 reps;Seated    General Comments        Pertinent Vitals/Pain Pain Assessment: 0-10 Pain Score: 6  Pain Location: sternal Pain Descriptors / Indicators:  (muscle spasm at the "rib") Pain Intervention(s): Monitored during session    Home Living                      Prior Function            PT Goals (current goals can now be found in the care plan section) Progress towards PT goals: Progressing toward goals    Frequency  Min 3X/week    PT Plan Current plan remains appropriate    Co-evaluation             End of Session Equipment Utilized During Treatment: Gait belt;Oxygen Activity Tolerance: Patient tolerated treatment well;Patient limited by fatigue Patient left: in chair;with call bell/phone within reach;with nursing/sitter in room     Time: 1039-1101 PT Time Calculation (min) (ACUTE ONLY): 22 min  Charges:  $Gait Training: 8-22 mins  G CodesMarcene [name]:      Jordan [name] Jordan [name] 10/20/2015, 3:32 PM   Jordan ShockAshly Marjory [name], PT, DPT Pager #: [phone] Office #: [phone]

## 2015-10-20 NOTE — Progress Notes (Signed)
7 Days Post-Op Procedure(s) (LRB): CORONARY ARTERY BYPASS GRAFTING (CABG) x four using left internal mammary artery and left leg greater saphenous vein harvested endoscopically (N/A) TRANSESOPHAGEAL ECHOCARDIOGRAM (TEE) (N/A) MITRAL VALVE (MV) REPLACEMENT (N/A) TRICUSPID VALVE REPAIR (N/A) Subjective:  No complaints   Objective: Vital signs in last 24 hours: Temp:  [97.2 F (36.2 C)-98 F (36.7 C)] 98 F (36.7 C) (12/30 0746) Pulse Rate:  [65-85] 70 (12/30 0700) Cardiac Rhythm:  [-] Atrial paced (12/29 2000) Resp:  [16-36] 22 (12/30 0700) BP: (85-133)/(62-80) 107/77 mmHg (12/30 0700) SpO2:  [88 %-100 %] 88 % (12/30 0700) Weight:  [101 kg (222 lb 10.6 oz)] 101 kg (222 lb 10.6 oz) (12/30 0700)  Hemodynamic parameters for last 24 hours:    Intake/Output from previous day: 12/29 0701 - 12/30 0700 In: 312.5 [I.V.:250; IV Piggyback:62.5] Out: 1975 [Urine:1975] Intake/Output this shift:    General appearance: alert and cooperative Heart: regular rate and rhythm, S1, S2 normal, no murmur, click, rub or gallop Lungs: rales bibasilar Extremities: edema mild edema in legs Wound: incision ok  Lab Results:  Recent Labs  10/17/15 1430 10/18/15 0915  WBC 12.1* 10.9*  HGB 8.5* 8.9*  HCT 25.6* 27.2*  PLT 287 352   BMET:  Recent Labs  10/19/15 0516 10/20/15 0419  NA 136 139  K 3.6 4.1  CL 101 102  CO2 27 27  GLUCOSE 100* 105*  BUN 23* 23*  CREATININE 1.97* 2.24*  CALCIUM 7.9* 8.2*    PT/INR:  Recent Labs  10/20/15 0419  LABPROT 35.4*  INR 3.64*   ABG    Component Value Date/Time   PHART 7.515* 10/15/2015 1421   HCO3 24.3* 10/15/2015 1421   TCO2 23 10/15/2015 1510   ACIDBASEDEF 3.0* 10/14/2015 0405   O2SAT 94.0 10/15/2015 1421   CBG (last 3)   Recent Labs  10/17/15 1233 10/17/15 1633 10/20/15 0048  GLUCAP 95 86 104*    Assessment/Plan: S/P Procedure(s) (LRB): CORONARY ARTERY BYPASS GRAFTING (CABG) x four using left internal mammary artery and  left leg greater saphenous vein harvested endoscopically (N/A) TRANSESOPHAGEAL ECHOCARDIOGRAM (TEE) (N/A) MITRAL VALVE (MV) REPLACEMENT (N/A) TRICUSPID VALVE REPAIR (N/A)  He is hemodynamically stable in sinus rhythm on oral amio.  Stage 3 CKD: creat up slightly today after diuresis yesterday. Will hold on lasix today and start oral lasix tomorrow. Still has volume excess with weight 12 lbs over preop.  Therapeutic on coumadin. INR decreasing slightly now so can resume low dose coumadin for pericardial mitral valve. INR 2.5-3.  Continue IS, ambulation  Placement: he lives alone and will need SNF for rehab before he goes home. Will consult social worker. I anticipate it will be early next week before he is strong enough to go to SNF.  Transfer to 2W stepdown circle.     LOS: 12 days    Alleen BorneBryan K Dula Havlik 10/20/2015

## 2015-10-20 NOTE — Progress Notes (Signed)
Utilization review completed.  

## 2015-10-20 NOTE — Clinical Social Work Note (Signed)
Clinical Social Work Assessment  Patient Details  Name: Jordan [name] MRN: [id number] Date of Birth: [birth year]  Date of referral:  10/20/15               Reason for consult:  Facility Placement                Permission sought to share information with:  Facility Medical sales representativeContact Representative, Family Supports Permission granted to share information::  Yes, Verbal Permission Granted  Name::        Agency[facility]  Relationship::     Contact Information:     Housing/Transportation Living arrangements for the past 2 months:  Single Family Home Source of Information:  Patient Patient Interpreter Needed:  None Criminal Activity/Legal Involvement Pertinent to Current Situation/Hospitalization:    Significant Relationships:  Spouse Lives with:  Self Do you feel safe going back to the place where you live?  Yes Need for family participation in patient care:  No (Coment)  Care giving concerns:  Pt lives alone but has ex wife who he can stay with when he discharges from the hospital- ex-wife works however and would not be able to provide 24 hour assistance- pt reports having some friends who can also assist but he is not positive that he will have appropriate level of care   Social Worker assessment / plan:  CSW spoke with pt concerning MD recommendation for 24 hour care after surgery and his current level of mobility after surgery  Employment status:  Retired Database administratornsurance information:  Managed Medicare PT Recommendations:  Skilled Nursing Facility Information / Referral to community resources:  Skilled Nursing Facility  Patient/Family's Response to care:  Pt is agreeable to looking into options for SNF but is unsure of what him and his ex-wife will decide to do at this time.  Patient/Family's Understanding of and Emotional Response to Diagnosis, Current Treatment, and Prognosis:  No questions or concerns  Emotional Assessment Appearance:  Appears stated  age Attitude/Demeanor/Rapport:    Affect (typically observed):  Appropriate Orientation:  Oriented to Situation, Oriented to  Time, Oriented to Place, Oriented to Self Alcohol / Substance use:  Not Applicable Psych involvement (Current and /or in the community):     Discharge Needs  Concerns to be addressed:  Care Coordination Readmission within the last 30 days:  No Current discharge risk:  Physical Impairment Barriers to Discharge:  Continued Medical Work up   Pe[employer]ody EnergyHoloman, Jordan Basley M, LCSW 10/20/2015, 4:45 PM

## 2015-10-20 NOTE — Clinical Social Work Placement (Signed)
   CLINICAL SOCIAL WORK PLACEMENT  NOTE  Date:  10/20/2015  Patient Details  Name: Jordan [name] MRN: [id number] Date of Birth: May 13, 1946  Clinical Social Work is seeking post-discharge placement for this patient at the Skilled  Nursing Facility level of care (*CSW will initial, date and re-position this form in  chart as items are completed):  Yes   Patient/family provided with [facility] Clinical Social Work Department's list of facilities offering this level of care within the geographic area requested by the patient (or if unable, by the patient's family).  Yes   Patient/family informed of their freedom to choose among providers that offer the needed level of care, that participate in Medicare, Medicaid or managed care program needed by the patient, have an available bed and are willing to accept the patient.  Yes   Patient/family informed of [facility]'s ownership interest in [facility] and [facility], as well as of the fact that they are under no obligation to receive care at these facilities.  PASRR submitted to EDS on 10/20/15     PASRR number received on 10/20/15     Existing PASRR number confirmed on       FL2 transmitted to all facilities in geographic area requested by pt/family on 10/20/15     FL2 transmitted to all facilities within larger geographic area on       Patient informed that his/her managed care company has contracts with or will negotiate with certain facilities, including the following:            Patient/family informed of bed offers received.  Patient chooses bed at       Physician recommends and patient chooses bed at      Patient to be transferred to   on  .  Patient to be transferred to facility by       Patient family notified on   of transfer.  Name of family member notified:        PHYSICIAN Please sign FL2     Additional Comment:    _______________________________________________ Izora RibasHoloman, Dorinne Graeff M, LCSW 10/20/2015,  4:48 PM

## 2015-10-20 NOTE — [place] FL2 (Signed)
[place] MEDICAID FL2 LEVEL OF CARE SCREENING TOOL     IDENTIFICATION  Patient Name: Jordan [name] Birthdate: 08/14/1946 Sex: male Admission Date (Current Location): 10/08/2015  [facility] and [place] Number:  Producer, television/film/video and Address:  [facility], [address] [place], [place] [address]      Provider Number: [id number]  Attending Physician Name and Address:  [name], MD  Relative Name and Phone Number:       Current Level of Care: Hospital Recommended Level of Care: Skilled Nursing Facility Prior Approval Number:    Date Approved/Denied:   PASRR Number: [id number]  Discharge Plan: SNF    Current Diagnoses: Patient Active Problem List   Diagnosis Date Noted  . CAD (coronary artery disease)   . S/P CABG (coronary artery bypass graft)   . S/P CABG x 4   . Tricuspid valve regurgitation 10/13/2015  . ACS (acute coronary syndrome) (HCC)   . Acute congestive heart failure (HCC)   . Atrial fibrillation with RVR (HCC) 10/10/2015  . Demand ischemia (HCC) 10/09/2015  . Non-STEMI (non-ST elevated myocardial infarction) (HCC) 10/08/2015  . Acute diastolic (congestive) heart failure (HCC) 10/08/2015  . Stage III chronic kidney disease 05/06/2015  . Hiatal hernia 05/06/2015  . Cervical spondylosis with radiculopathy 12/04/2012  . Hyperlipidemia 11/21/2007  . Obstructive sleep apnea 11/21/2007  . Hypertensive heart disease     Orientation RESPIRATION BLADDER Height & Weight    Self, Time, Situation, Place  O2 (1L ) Continent  (167.6 cm) 222 lbs.  BEHAVIORAL SYMPTOMS/MOOD NEUROLOGICAL BOWEL NUTRITION STATUS      Continent Diet (cardiac)  AMBULATORY STATUS COMMUNICATION OF NEEDS Skin   Limited Assist Verbally Surgical wounds                       Personal Care Assistance Level of Assistance  Bathing, Dressing Bathing Assistance: Limited assistance   Dressing Assistance: Limited assistance     Functional  Limitations Info             SPECIAL CARE FACTORS FREQUENCY  PT (By licensed PT)     PT Frequency: 5/wk              Contractures      Additional Factors Info  Code Status, Allergies Code Status Info: FULL Allergies Info: NKA           Current Medications (10/20/2015):  This is the current hospital active medication list Current Facility-Administered Medications  Medication Dose Route Frequency Provider Last Rate Last Dose  . 0.9 %  sodium chloride infusion  250 mL Intravenous PRN [name], MD      . amiodarone (PACERONE) tablet 200 mg  200 mg Oral BID [name], MD   200 mg at 10/20/15 1114  . antiseptic oral rinse solution (CORINZ)  7 mL Mouth Rinse QID [name], MD   7 mL at 10/20/15 0400  . [START ON 10/21/2015] aspirin EC tablet 81 mg  81 mg Oral Daily [name], MD      . chlorhexidine gluconate (PERIDEX) 0.12 % solution 15 mL  15 mL Mouth Rinse BID [name], MD   15 mL at 10/20/15 0830  . [START ON 10/21/2015] docusate sodium (COLACE) capsule 200 mg  200 mg Oral Daily [name], MD      . Melene Muller ON 10/21/2015] furosemide (LASIX) tablet 40 mg  40 mg Oral Daily  Alleen BorneBryan K Bartle, MD      . moving right along book   Does not apply Once Alleen BorneBryan K Bartle, MD      . ondansetron [facility](ZOFRAN) tablet 4 mg  4 mg Oral Q6H PRN Alleen BorneBryan K Bartle, MD       Or  . ondansetron (ZOFRAN) injection 4 mg  4 mg Intravenous Q6H PRN Alleen BorneBryan K Bartle, MD      . oxyCODONE (Oxy IR/ROXICODONE) immediate release tablet 5-10 mg  5-10 mg Oral Q3H PRN Alleen BorneBryan K Bartle, MD      . Melene Muller[START ON 10/21/2015] pantoprazole (PROTONIX) EC tablet 40 mg  40 mg Oral QAC breakfast Alleen BorneBryan K Bartle, MD      . patient's guide to using coumadin book   Does not apply Once Herby AbrahamMichelle T Bell, [facility]PH      . simvastatin (ZOCOR) tablet 20 mg  20 mg Oral Daily Othella BoyerWilliam S Tilley, MD   20 mg at 10/20/15 0955  . sodium chloride 0.9 % injection 10-40 mL  10-40 mL Intracatheter Q12H Alleen BorneBryan K Bartle, MD   20 mL at  10/20/15 1114  . sodium chloride 0.9 % injection 10-40 mL  10-40 mL Intracatheter PRN Alleen BorneBryan K Bartle, MD      . sodium chloride 0.9 % injection 3 mL  3 mL Intravenous Q12H Alleen BorneBryan K Bartle, MD   3 mL at 10/20/15 1415  . sodium chloride 0.9 % injection 3 mL  3 mL Intravenous PRN Alleen BorneBryan K Bartle, MD      . traMADol Janean Sark(ULTRAM) tablet 50 mg  50 mg Oral Q12H PRN Alleen BorneBryan K Bartle, MD   50 mg at 10/20/15 0531  . warfarin (COUMADIN) tablet 2.5 mg  2.5 mg Oral ONCE-1800 Alleen BorneBryan K Bartle, MD      . warfarin (COUMADIN) video   Does not apply Once Herby AbrahamMichelle T Bell, [facility]      . Warfarin - Physician Dosing Inpatient   Does not apply [id number] Alleen BorneBryan K Bartle, MD   0  at 10/16/15 1800     Discharge Medications: Please see discharge summary for a list of discharge medications.  Relevant Imaging Results:  Relevant Lab Results:   Additional Information SS#: [id number]  Izora RibasHoloman, Caleyah Jr M, [place]

## 2015-10-21 LAB — BASIC METABOLIC PANEL
ANION GAP: 8 (ref 5–15)
BUN: 21 mg/dL — ABNORMAL HIGH (ref 6–20)
CHLORIDE: 100 mmol/L — AB (ref 101–111)
CO2: 27 mmol/L (ref 22–32)
Calcium: 8.3 mg/dL — ABNORMAL LOW (ref 8.9–10.3)
Creatinine, Ser: 2.05 mg/dL — ABNORMAL HIGH (ref 0.61–1.24)
GFR calc non Af Amer: 31 mL/min — ABNORMAL LOW (ref >60)
GFR, EST AFRICAN AMERICAN: 36 mL/min — AB (ref >60)
GLUCOSE: 96 mg/dL (ref 65–99)
Potassium: 4 mmol/L (ref 3.5–5.1)
Sodium: 135 mmol/L (ref 135–145)

## 2015-10-21 LAB — CBC
HEMATOCRIT: 26.3 % — AB (ref 39.0–52.0)
HEMOGLOBIN: 8.3 g/dL — AB (ref 13.0–17.0)
MCH: 27.6 pg (ref 26.0–34.0)
MCHC: 31.6 g/dL (ref 30.0–36.0)
MCV: 87.4 fL (ref 78.0–100.0)
Platelets: 597 10*3/uL — ABNORMAL HIGH (ref 150–400)
RBC: 3.01 MIL/uL — AB (ref 4.22–5.81)
RDW: 17 % — ABNORMAL HIGH (ref 11.5–15.5)
WBC: 11.7 10*3/uL — ABNORMAL HIGH (ref 4.0–10.5)

## 2015-10-21 LAB — PROTIME-INR
INR: 3.19 — ABNORMAL HIGH (ref 0.00–1.49)
PROTHROMBIN TIME: 32 s — AB (ref 11.6–15.2)

## 2015-10-21 MED ORDER — FERROUS SULFATE 325 (65 FE) MG PO TABS
325.0000 mg | ORAL_TABLET | Freq: Every day | ORAL | Status: DC
  Administered 2015-10-22 – 2015-10-25 (×4): 325 mg via ORAL
  Filled 2015-10-21 (×4): qty 1

## 2015-10-21 MED ORDER — FOLIC ACID 1 MG PO TABS
1.0000 mg | ORAL_TABLET | Freq: Every day | ORAL | Status: DC
  Administered 2015-10-21 – 2015-10-25 (×5): 1 mg via ORAL
  Filled 2015-10-21 (×5): qty 1

## 2015-10-21 MED ORDER — METOLAZONE 5 MG PO TABS
5.0000 mg | ORAL_TABLET | Freq: Every day | ORAL | Status: DC
  Administered 2015-10-21: 5 mg via ORAL
  Filled 2015-10-21: qty 1

## 2015-10-21 MED ORDER — WARFARIN SODIUM 1 MG PO TABS
1.0000 mg | ORAL_TABLET | Freq: Once | ORAL | Status: AC
  Administered 2015-10-21: 1 mg via ORAL
  Filled 2015-10-21: qty 1

## 2015-10-21 NOTE — Progress Notes (Signed)
Patient refused to walk on the unit. Stating "my pain wont let me walk."  RN educated patient on the importance of walking to progress recovery.  Patient verbalized understanding.  RN will continue with plan of care.

## 2015-10-21 NOTE — Progress Notes (Addendum)
      [address]       [place] [id number]             [phone]        8 Days Post-Op Procedure(s) (LRB): CORONARY ARTERY BYPASS GRAFTING (CABG) x four using left internal mammary artery and left leg greater saphenous vein harvested endoscopically (N/A) TRANSESOPHAGEAL ECHOCARDIOGRAM (TEE) (N/A) MITRAL VALVE (MV) REPLACEMENT (N/A) TRICUSPID VALVE REPAIR (N/A)  Subjective: Patient tired this am  Objective: Vital signs in last 24 hours: Temp:  [97.5 F (36.4 C)-97.7 F (36.5 C)] 97.7 F (36.5 C) (12/31 0515) Pulse Rate:  [70-74] 71 (12/31 0515) Cardiac Rhythm:  [-] Normal sinus rhythm (12/30 2008) Resp:  [15-23] 18 (12/31 0515) BP: (102-130)/(63-80) 104/63 mmHg (12/31 0515) SpO2:  [93 %-99 %] 96 % (12/31 0515) Weight:  [228 lb 4.8 oz (103.556 kg)] 228 lb 4.8 oz (103.556 kg) (12/31 0515)  Pre op weight 95 kg Current Weight  10/21/15 228 lb 4.8 oz (103.556 kg)       Intake/Output from previous day: 12/30 0701 - 12/31 0700 In: 240 [P.O.:240] Out: 1025 [Urine:1025]   Physical Exam:  Cardiovascular: RRR, no murmur Pulmonary: Diminished at bases; no rales, wheezes, or rhonchi. Abdomen: Soft, non tender, bowel sounds present. Extremities: Bilateral lower extremity edema. Wounds: Clean and dry.  No erythema or signs of infection.  Lab Results: CBC: Recent Labs  10/18/15 0915 10/21/15 0455  WBC 10.9* 11.7*  HGB 8.9* 8.3*  HCT 27.2* 26.3*  PLT 352 597*   BMET:  Recent Labs  10/20/15 0419 10/21/15 0455  NA 139 135  K 4.1 4.0  CL 102 100*  CO2 27 27  GLUCOSE 105* 96  BUN 23* 21*  CREATININE 2.24* 2.05*  CALCIUM 8.2* 8.3*    PT/INR:  Lab Results  Component Value Date   INR 3.19* 10/21/2015   INR 3.64* 10/20/2015   INR 3.99* 10/19/2015   ABG:  INR: Will add last result for INR, ABG once components are confirmed Will add last 4 CBG results once components are confirmed  Assessment/Plan:  1. CV - Previous a fib. SR in the  70's this am. On Amiodarone 200 mg bid and Coumadin. INR decreased from 3.64 to 3.14. Will give low dose Coumadin tonight. 2.  Pulmonary - On room air. Encourage incentive spirometer. 3. Volume Overload - On Lasix 40 mg daily 4.  Acute blood loss anemia - H and H 8.3 and 26.3. Start folic acid and oral ferrous. 5. CKD (stage III)-creatinine down to 2.05. Will give oral Lasix as is volume overloaded and monitor creatinine. 6. Will remove EPW once INR decreased 7. Will need SNF when ready for discharge  ZIMMERMAN,DONIELLE MPA-C 10/21/2015,7:55 AM  patient examined and medical record reviewed,agree with above note.Try to DC EPWs tomorrow Kathlee Nationseter Van Trigt III 10/21/2015

## 2015-10-21 NOTE — Discharge Instructions (Signed)
Coronary Artery Bypass Grafting, Care After °These instructions give you information on caring for yourself after your procedure. Your doctor may also give you more specific instructions. Call your doctor if you have any problems or questions after your procedure.  °HOME CARE °· Only take medicine as told by your doctor. Take medicines exactly as told. Do not stop taking medicines or start any new medicines without talking to your doctor first. °· Take your pulse as told by your doctor. °· Do deep breathing as told by your doctor. Use your breathing device (incentive spirometer), if given, to practice deep breathing several times a [place]. Support your chest with a pillow or your arms when you take deep breaths or cough. °· Keep the area clean, dry, and protected where the surgery cuts (incisions) were made. Remove bandages (dressings) only as told by your doctor. If strips were applied to surgical area, do not take them off. They fall off on their own. °· Check the surgery area daily for puffiness (swelling), redness, or leaking fluid. °· If surgery cuts were made in your legs: °¨ Avoid crossing your legs. °¨ Avoid sitting for long periods of time. Change positions every 30 minutes. °¨ Raise your legs when you are sitting. Place them on pillows. °· Wear stockings that help keep blood clots from forming in your legs (compression stockings). °· Only take sponge baths until your doctor says it is okay to take showers. Pat the surgery area dry. Do not rub the surgery area with a washcloth or towel. Do not bathe, swim, or use a hot tub until your doctor says it is okay. °· Eat foods that are high in fiber. These include raw fruits and vegetables, whole grains, beans, and nuts. Choose lean meats. Avoid canned, processed, and fried foods. °· Drink enough fluids to keep your pee (urine) clear or pale yellow. °· Weigh yourself every [place]. °· Rest and limit activity as told by your doctor. You may be told to: °¨ Stop any  activity if you have chest pain, shortness of breath, changes in heartbeat, or dizziness. Get help right away if this happens. °¨ Move around often for short amounts of time or take short walks as told by your doctor. Gradually become more active. You may need help to strengthen your muscles and build endurance. °¨ Avoid lifting, pushing, or pulling anything heavier than 10 pounds (4.5 kg) for at least 6 weeks after surgery. °· Do not drive until your doctor says it is okay. °· Ask your doctor when you can go back to work. °· Ask your doctor when you can begin sexual activity again. °· Follow up with your doctor as told. °GET HELP IF: °· You have puffiness, redness, more pain, or fluid draining from the incision site. °· You have a fever. °· You have puffiness in your ankles or legs. °· You have pain in your legs. °· You gain 2 or more pounds (0.9 kg) a [place]. °· You feel sick to your stomach (nauseous) or throw up (vomit). °· You have watery poop (diarrhea). °GET HELP RIGHT AWAY IF: °· You have chest pain that goes to your jaw or arms. °· You have shortness of breath. °· You have a fast or irregular heartbeat. °· You notice a "clicking" in your breastbone when you move. °· You have numbness or weakness in your arms or legs. °· You feel dizzy or light-headed. °MAKE SURE YOU: °· Understand these instructions. °· Will watch your condition. °· Will get   help right away if you are not doing well or get worse.   This information is not intended to replace advice given to you by your health care provider. Make sure you discuss any questions you have with your health care provider.   Document Released: 10/12/2013 Document Reviewed: 10/12/2013 Elsevier Interactive Patient Education [employer]2016 Elsevier Inc.   Information on my medicine - Coumadin   (Warfarin)  This medication education was reviewed with me or my healthcare representative as part of my discharge preparation.    Why was Coumadin prescribed for  you? Coumadin was prescribed for you because you have a blood clot or a medical condition that can cause an increased risk of forming blood clots. Blood clots can cause serious health problems by blocking the flow of blood to the heart, lung, or brain. Coumadin can prevent harmful blood clots from forming. As a reminder your indication for Coumadin is:   Stroke Prevention Because Of Atrial Fibrillation  What test will check on my response to Coumadin? While on Coumadin (warfarin) you will need to have an INR test regularly to ensure that your dose is keeping you in the desired range. The INR (international normalized ratio) number is calculated from the result of the laboratory test called prothrombin time (PT).  If an INR APPOINTMENT HAS NOT ALREADY BEEN MADE FOR YOU please schedule an appointment to have this lab work done by your health care provider within 7 days. Your INR goal is usually a number between:  2.5 to 3.  What  do you need to  know  About  COUMADIN? Take Coumadin (warfarin) exactly as prescribed by your healthcare provider about the same time each day.  DO NOT stop taking without talking to the doctor who prescribed the medication.  Stopping without other blood clot prevention medication to take the place of Coumadin may increase your risk of developing a new clot or stroke.  Get refills before you run out.  What do you do if you miss a dose? If you miss a dose, take it as soon as you remember on the same day then continue your regularly scheduled regimen the next day.  Do not take two doses of Coumadin at the same time.  Important Safety Information A possible side effect of Coumadin (Warfarin) is an increased risk of bleeding. You should call your healthcare provider right away if you experience any of the following: ? Bleeding from an injury or your nose that does not stop. ? Unusual colored urine (red or dark brown) or unusual colored stools (red or black). ? Unusual bruising  for unknown reasons. ? A serious fall or if you hit your head (even if there is no bleeding).  Some foods or medicines interact with Coumadin (warfarin) and might alter your response to warfarin. To help avoid this: ? Eat a balanced diet, maintaining a consistent amount of Vitamin K. ? Notify your provider about major diet changes you plan to make. ? Avoid alcohol or limit your intake to 1 drink for women and 2 drinks for men per day. (1 drink is 5 oz. wine, 12 oz. beer, or 1.5 oz. liquor.)  Make sure that ANY health care provider who prescribes medication for you knows that you are taking Coumadin (warfarin).  Also make sure the healthcare provider who is monitoring your Coumadin knows when you have started a new medication including herbals and non-prescription products.  Coumadin (Warfarin)  Major Drug Interactions  Increased Warfarin Effect Decreased Warfarin Effect  Alcohol (large quantities) Antibiotics (esp. Septra/Bactrim, Flagyl, Cipro) Amiodarone (Cordarone) Aspirin (ASA) Cimetidine (Tagamet) Megestrol (Megace) NSAIDs (ibuprofen, naproxen, etc.) Piroxicam (Feldene) Propafenone (Rythmol SR) Propranolol (Inderal) Isoniazid (INH) Posaconazole (Noxafil) Barbiturates (Phenobarbital) Carbamazepine (Tegretol) Chlordiazepoxide (Librium) Cholestyramine (Questran) Griseofulvin Oral Contraceptives Rifampin Sucralfate (Carafate) Vitamin K   Coumadin (Warfarin) Major Herbal Interactions  Increased Warfarin Effect Decreased Warfarin Effect  Garlic Ginseng Ginkgo biloba Coenzyme Q10 Green tea St. Johns wort    Coumadin (Warfarin) FOOD Interactions  Eat a consistent number of servings per week of foods HIGH in Vitamin K (1 serving =  cup)  Collards (cooked, or boiled & drained) Kale (cooked, or boiled & drained) Mustard greens (cooked, or boiled & drained) Parsley *serving size only =  cup Spinach (cooked, or boiled & drained) Swiss chard (cooked, or boiled &  drained) Turnip greens (cooked, or boiled & drained)  Eat a consistent number of servings per week of foods MEDIUM-HIGH in Vitamin K (1 serving = 1 cup)  Asparagus (cooked, or boiled & drained) Broccoli (cooked, boiled & drained, or raw & chopped) Brussel sprouts (cooked, or boiled & drained) *serving size only =  cup Lettuce, raw (green leaf, endive, romaine) Spinach, raw Turnip greens, raw & chopped   These websites have more information on Coumadin (warfarin):  http://www.king-russell.com/; https://www.hines.net/;

## 2015-10-21 NOTE — Progress Notes (Signed)
CARDIAC REHAB PHASE I   PRE:  Rate/Rhythm: 78 SR    BP: sitting 117/78    SaO2: 100 2 1/2L, 95 RA  MODE:  Ambulation: 150 ft   POST:  Rate/Rhythm: 85 SR    BP: sitting 132/73     SaO2: 92 RA  Pt asleep on arrival but eager to walk upon waking. D/c'd O2 and SaO2 95 RA. Able to walk without c/o 150 ft. To recliner. Left O2 off and encouraged IS/flutter valve. Pt agreeable. Encouraged x3 walks qd (we will not be back till Tuesday due to holiday).  [id number]  Elissa LovettReeve, Thurlow Gallaga HartwellKristan CES, ACSM 10/21/2015 3:43 PM

## 2015-10-22 LAB — BASIC METABOLIC PANEL
ANION GAP: 10 (ref 5–15)
BUN: 24 mg/dL — ABNORMAL HIGH (ref 6–20)
CHLORIDE: 99 mmol/L — AB (ref 101–111)
CO2: 26 mmol/L (ref 22–32)
CREATININE: 2.32 mg/dL — AB (ref 0.61–1.24)
Calcium: 8.3 mg/dL — ABNORMAL LOW (ref 8.9–10.3)
GFR calc non Af Amer: 27 mL/min — ABNORMAL LOW (ref >60)
GFR, EST AFRICAN AMERICAN: 31 mL/min — AB (ref >60)
Glucose, Bld: 104 mg/dL — ABNORMAL HIGH (ref 65–99)
POTASSIUM: 3.7 mmol/L (ref 3.5–5.1)
SODIUM: 135 mmol/L (ref 135–145)

## 2015-10-22 LAB — PROTIME-INR
INR: 3.39 — ABNORMAL HIGH (ref 0.00–1.49)
Prothrombin Time: 33.6 seconds — ABNORMAL HIGH (ref 11.6–15.2)

## 2015-10-22 MED ORDER — WARFARIN SODIUM 1 MG PO TABS
1.0000 mg | ORAL_TABLET | Freq: Once | ORAL | Status: AC
  Administered 2015-10-22: 1 mg via ORAL
  Filled 2015-10-22: qty 1

## 2015-10-22 MED ORDER — POTASSIUM CHLORIDE CRYS ER 20 MEQ PO TBCR
20.0000 meq | EXTENDED_RELEASE_TABLET | Freq: Once | ORAL | Status: AC
  Administered 2015-10-22: 20 meq via ORAL
  Filled 2015-10-22: qty 1

## 2015-10-22 MED ORDER — LACTULOSE 10 GM/15ML PO SOLN
20.0000 g | Freq: Once | ORAL | Status: AC
  Administered 2015-10-22: 20 g via ORAL
  Filled 2015-10-22: qty 30

## 2015-10-22 MED ORDER — AMIODARONE HCL 200 MG PO TABS
200.0000 mg | ORAL_TABLET | Freq: Every day | ORAL | Status: DC
  Administered 2015-10-22 – 2015-10-25 (×4): 200 mg via ORAL
  Filled 2015-10-22 (×4): qty 1

## 2015-10-22 NOTE — Progress Notes (Signed)
10/22/2015 1:04 PM Pt walked 32500ft. with rolling walker on RA.  Pt tolerated well. Kathryne HitchAllen, Railey Glad C

## 2015-10-22 NOTE — Progress Notes (Signed)
10/22/2015 0930 Encouraged pt to walk.  Pt does not want to get up at this time, he is nauseated.  Pt would like to take a nap and walk later. Kathryne HitchAllen, [name]

## 2015-10-22 NOTE — Progress Notes (Addendum)
      [address]       [place] [id number]             [phone]        9 Days Post-Op Procedure(s) (LRB): CORONARY ARTERY BYPASS GRAFTING (CABG) x four using left internal mammary artery and left leg greater saphenous vein harvested endoscopically (N/A) TRANSESOPHAGEAL ECHOCARDIOGRAM (TEE) (N/A) MITRAL VALVE (MV) REPLACEMENT (N/A) TRICUSPID VALVE REPAIR (N/A)  Subjective: Patient with nausea this am.  Objective: Vital signs in last 24 hours: Temp:  [97.5 F (36.4 C)-98.5 F (36.9 C)] 98.5 F (36.9 C) (01/01 0438) Pulse Rate:  [78-85] 78 (01/01 0438) Cardiac Rhythm:  [-] Normal sinus rhythm (12/31 1932) Resp:  [18-20] 18 (01/01 0438) BP: (103-129)/(54-76) 115/70 mmHg (01/01 0438) SpO2:  [93 %-97 %] 96 % (01/01 0438) Weight:  [221 lb 4.8 oz (100.381 kg)] 221 lb 4.8 oz (100.381 kg) (01/01 0438)  Pre op weight 95 kg Current Weight  10/22/15 221 lb 4.8 oz (100.381 kg)       Intake/Output from previous day: 12/31 0701 - 01/01 0700 In: 480 [P.O.:480] Out: 1500 [Urine:1500]   Physical Exam:  Cardiovascular: RRR, no murmur Pulmonary: Diminished at bases; no rales, wheezes, or rhonchi. Abdomen: Soft, non tender, bowel sounds present. Extremities: Bilateral lower extremity edema. Wounds: Clean and dry.  No erythema or signs of infection.  Lab Results: CBC:  Recent Labs  10/21/15 0455  WBC 11.7*  HGB 8.3*  HCT 26.3*  PLT 597*   BMET:   Recent Labs  10/21/15 0455 10/22/15 0601  NA 135 135  K 4.0 3.7  CL 100* 99*  CO2 27 26  GLUCOSE 96 104*  BUN 21* 24*  CREATININE 2.05* 2.32*  CALCIUM 8.3* 8.3*    PT/INR:  Lab Results  Component Value Date   INR 3.39* 10/22/2015   INR 3.19* 10/21/2015   INR 3.64* 10/20/2015   ABG:  INR: Will add last result for INR, ABG once components are confirmed Will add last 4 CBG results once components are confirmed  Assessment/Plan:  1. CV - Previous a fib. SR in the 70's this am. On  Amiodarone 200 mg bid and Coumadin. INR increased from 3.19 to 3.39. Will give low dose Coumadin tonight. 2.  Pulmonary - On room air. Encourage incentive spirometer. 3. Volume Overload - On Lasix 40 mg daily 4.  Acute blood loss anemia - H and H 8.3 and 26.3. Start folic acid and oral ferrous. 5. CKD (stage III)-creatinine slightly increased from 2.05 to 2.32 Will continue oral Lasix as is volume overloaded but will not give Zaroxolyn and monitor creatinine. 6. Will remove EPW once INR decreased 7. Gently supplement potassium 8. GI-nausea but no emesis. LOC constipation as has not had bowel movement in 2 days. On Amiodarone and Ferrous. If continues with nausea, will stop Ferrous and will decrease Amiodarone. 9. Will need SNF when ready for discharge  ZIMMERMAN,DONIELLE MPA-C 10/22/2015,8:00 AM   Reduce amiodarone to 200/day Hold beta blocker Cont with po lasix and watch creat Coumadin 1 mg this pm.  patient examined and medical record reviewed,agree with above note. Kathlee Nationseter Van Trigt III 10/22/2015

## 2015-10-22 NOTE — Progress Notes (Signed)
Patient ambulated 25900ft on unit with no assistance on room air.  Patient tolerated well.  RN will continue to monitor

## 2015-10-22 NOTE — Social Work (Signed)
CSW provided patient and spouse with list of bed offers. Patient and wife stated that [facility]. CSW made clear for patient and wife that a back up may be necessary in case that facility does not offer a bed. Wife stated that she would look into those facilities in order to have a back up in case a bed is not offered. CSW made it clear that since these offers have been made, we can not hold discharge for a facility of preference. Beverly Sessionsywan J Clarabelle Oscarson MSW, LCSW [phone]

## 2015-10-23 LAB — BASIC METABOLIC PANEL
ANION GAP: 7 (ref 5–15)
BUN: 21 mg/dL — AB (ref 6–20)
CHLORIDE: 99 mmol/L — AB (ref 101–111)
CO2: 28 mmol/L (ref 22–32)
Calcium: 8.3 mg/dL — ABNORMAL LOW (ref 8.9–10.3)
Creatinine, Ser: 2.14 mg/dL — ABNORMAL HIGH (ref 0.61–1.24)
GFR, EST AFRICAN AMERICAN: 34 mL/min — AB (ref >60)
GFR, EST NON AFRICAN AMERICAN: 29 mL/min — AB (ref >60)
Glucose, Bld: 104 mg/dL — ABNORMAL HIGH (ref 65–99)
POTASSIUM: 3.9 mmol/L (ref 3.5–5.1)
SODIUM: 134 mmol/L — AB (ref 135–145)

## 2015-10-23 LAB — PROTIME-INR
INR: 3.1 — AB (ref 0.00–1.49)
Prothrombin Time: 31.4 seconds — ABNORMAL HIGH (ref 11.6–15.2)

## 2015-10-23 MED ORDER — FUROSEMIDE 40 MG PO TABS
40.0000 mg | ORAL_TABLET | Freq: Two times a day (BID) | ORAL | Status: DC
  Administered 2015-10-23: 40 mg via ORAL
  Filled 2015-10-23 (×2): qty 1

## 2015-10-23 MED ORDER — POTASSIUM CHLORIDE CRYS ER 20 MEQ PO TBCR
20.0000 meq | EXTENDED_RELEASE_TABLET | Freq: Once | ORAL | Status: AC
  Administered 2015-10-23: 20 meq via ORAL
  Filled 2015-10-23: qty 1

## 2015-10-23 MED ORDER — LEVALBUTEROL HCL 1.25 MG/0.5ML IN NEBU
1.2500 mg | INHALATION_SOLUTION | Freq: Four times a day (QID) | RESPIRATORY_TRACT | Status: DC | PRN
  Administered 2015-10-24: 1.25 mg via RESPIRATORY_TRACT
  Filled 2015-10-23: qty 0.5

## 2015-10-23 MED ORDER — GUAIFENESIN ER 600 MG PO TB12
600.0000 mg | ORAL_TABLET | Freq: Two times a day (BID) | ORAL | Status: DC
  Administered 2015-10-23 – 2015-10-25 (×4): 600 mg via ORAL
  Filled 2015-10-23 (×4): qty 1

## 2015-10-23 MED ORDER — WARFARIN SODIUM 1 MG PO TABS: 1.0000 mg | ORAL_TABLET | Freq: Every day | ORAL | Status: DC

## 2015-10-23 NOTE — Progress Notes (Signed)
Physical Therapy Treatment Patient Details Name: Jordan BaldyLarry W [name] MRN: [id number] DOB: October 18, 1946 Today's Date: 10/23/2015    History of Present Illness pt is a [age] y/o male with h/o HTN, bipolar d/o, sleep apnea, CAD, admitted with SOB and chest pain, found to have elevated troponins suggestive of NSTEMI, diastolic HF and Afib with RVR, s/p CABGx4, MVR and tricuspid valve repair.    PT Comments    Progressing well with mobility.  SpO2 maintained in the mid to upper 90%'s and EHR no more than 102 bpm with mild SOB only.  Follow Up Recommendations  Home health PT     Equipment Recommendations       Recommendations for Other Services       Precautions / Restrictions Precautions Precautions: Fall;Sternal    Mobility  Bed Mobility Overal bed mobility: Modified Independent             General bed mobility comments: reinforced sternal precautions  Transfers Overall transfer level: Modified independent               General transfer comment: reinforced sternal precautions  Ambulation/Gait Ambulation/Gait assistance: Supervision Ambulation Distance (Feet): 400 Feet Assistive device: None Gait Pattern/deviations: Step-through pattern Gait velocity: slow   General Gait Details: steady, but unequal step length, dyspnea 2/4, sats maintained between 95 and 100% during ambulation with EHR slowly rising through the 90's to top out at 102 bpm   Stairs Stairs: Yes Stairs assistance: Supervision Stair Management: One rail Right;Alternating pattern;Forwards Number of Stairs: 5    Wheelchair Mobility    Modified Rankin (Stroke Patients Only)       Balance Overall balance assessment: Needs assistance Sitting-balance support: No upper extremity supported Sitting balance-Leahy Scale: Good     Standing balance support: No upper extremity supported Standing balance-Leahy Scale: Good                      Cognition Arousal/Alertness: Awake/alert Behavior  During Therapy: WFL for tasks assessed/performed Overall Cognitive Status: Within Functional Limits for tasks assessed                      Exercises General Exercises - Lower Extremity Hip ABduction/ADduction: AROM;Strengthening;Both;10 reps;Standing Hip Flexion/Marching: AROM;Strengthening;Both;10 reps;Standing Toe Raises: AROM;Both;10 reps;Standing Heel Raises: AROM;Both;10 reps;Standing Mini-Sqauts: AROM;10 reps;Standing    General Comments        Pertinent Vitals/Pain Pain Assessment: Faces Faces Pain Scale: Hurts little more Pain Location: sternal Pain Descriptors / Indicators: Aching Pain Intervention(s): Monitored during session    Home Living                      Prior Function            PT Goals (current goals can now be found in the care plan section) Acute Rehab PT Goals Patient Stated Goal: back to independent PT Goal Formulation: With patient Time For Goal Achievement: 11/01/15 Potential to Achieve Goals: Good Progress towards PT goals: Progressing toward goals    Frequency  Min 3X/week    PT Plan Current plan remains appropriate    Co-evaluation             End of Session   Activity Tolerance: Patient tolerated treatment well;Patient limited by fatigue Patient left: Other (comment);with call bell/phone within reach (sitting EOB)     Time: [id number] PT Time Calculation (min) (ACUTE ONLY): 17 min  Charges:  $Gait Training: 8-22 mins  G Codes:      Yanelli Zapanta, Eliseo Gum 10/23/2015, 1:43 PM 10/23/2015  [place] Bing, PT [phone]  (pager)

## 2015-10-23 NOTE — Progress Notes (Signed)
SUBJECTIVE:  No acute events noted.  OBJECTIVE:   Vitals:   Filed Vitals:   10/22/15 0937 10/22/15 1343 10/22/15 2048 10/23/15 0558  BP: 114/55 122/63 127/76 133/68  Pulse: 81 84 77 83  Temp:  97.3 F (36.3 C) 98 F (36.7 C) 97.9 F (36.6 C)  TempSrc:  Oral Oral Oral  Resp:  18 18 17   Height:      Weight:    218 lb 14.4 oz (99.292 kg)  SpO2:  92% 94% 92%   I&O's:   Intake/Output Summary (Last 24 hours) at 10/23/15 1032 Last data filed at 10/23/15 0730  Gross per 24 hour  Intake    600 ml  Output    650 ml  Net    -50 ml   TELEMETRY: Reviewed telemetry pt in NSR:     PHYSICAL EXAM General: Well developed, well nourished, in no acute distress Head:   Normal cephalic and atramatic  Lungs:  No wheezing. Heart:   HRRR S1 S2  No JVD.   Abdomen: obese   Skin: No rash   LABS: Basic Metabolic Panel:  Recent Labs  16/10/96 0601 10/23/15 0506  NA 135 134*  K 3.7 3.9  CL 99* 99*  CO2 26 28  GLUCOSE 104* 104*  BUN 24* 21*  CREATININE 2.32* 2.14*  CALCIUM 8.3* 8.3*   Liver Function Tests: No results for input(s): AST, ALT, ALKPHOS, BILITOT, PROT, ALBUMIN in the last 72 hours. No results for input(s): LIPASE, AMYLASE in the last 72 hours. CBC:  Recent Labs  10/21/15 0455  WBC 11.7*  HGB 8.3*  HCT 26.3*  MCV 87.4  PLT 597*   Cardiac Enzymes: No results for input(s): CKTOTAL, CKMB, CKMBINDEX, TROPONINI in the last 72 hours. BNP: Invalid input(s): POCBNP D-Dimer: No results for input(s): DDIMER in the last 72 hours. Hemoglobin A1C: No results for input(s): HGBA1C in the last 72 hours. Fasting Lipid Panel: No results for input(s): CHOL, HDL, LDLCALC, TRIG, CHOLHDL, LDLDIRECT in the last 72 hours. Thyroid Function Tests: No results for input(s): TSH, T4TOTAL, T3FREE, THYROIDAB in the last 72 hours.  Invalid input(s): FREET3 Anemia Panel: No results for input(s): VITAMINB12, FOLATE, FERRITIN, TIBC, IRON, RETICCTPCT in the last 72 hours. Coag  Panel:   Lab Results  Component Value Date   INR 3.10* 10/23/2015   INR 3.39* 10/22/2015   INR 3.19* 10/21/2015    RADIOLOGY: Dg Chest 2 View  10/10/2015  CLINICAL DATA:  CHF, chronic renal insufficiency EXAM: CHEST  2 VIEW COMPARISON:  PA and lateral chest x-ray of October 08, 2015 FINDINGS: The lungs are adequately inflated. The interstitial markings remain increased bilaterally. Areas of confluent alveolar opacity persist in the perihilar regions. There is mild tenting of the left hemidiaphragm. There is no pneumothorax. There are small bilateral pleural effusions blunting the posterior costophrenic angles. The mediastinum is normal in width. The bony thorax exhibits no acute abnormality. IMPRESSION: CHF with mild pulmonary interstitial edema and small bilateral pleural effusions. There has not been significant change since yesterday's study. Electronically Signed   By: David  [place]and M.D.   On: 10/10/2015 07:44   Dg Chest 2 View  10/08/2015  CLINICAL DATA:  Shortness of breath and chest pain EXAM: CHEST  2 VIEW COMPARISON:  08/30/2015 FINDINGS: Diffuse interstitial coarsening with fairly symmetric perihilar airspace opacity. There are Kerley lines best visualized in the lateral projection, where small pleural effusions are also seen bilaterally. Normal heart size and stable aortic and hilar contours.  IMPRESSION: Pulmonary edema and small pleural effusions. Electronically Signed   By: Marnee SpringJonathon  Watts M.D.   On: 10/08/2015 22:56   Dg Chest Port 1 View  10/19/2015  CLINICAL DATA:  70 year old male status post CABG, tricuspid valve repair and mitral valve replacement. EXAM: PORTABLE CHEST 1 VIEW COMPARISON:  Chest x-ray 10/17/2015. FINDINGS: Lung volumes remain low, but have improved slightly. Improving aeration in the right lung. There continues to be opacities throughout the mid to lower left lung, likely to reflect resolving postoperative subsegmental atelectasis. No confluent consolidative  airspace disease. No pleural effusions. No evidence of pulmonary edema. No pneumothorax. Cardiopericardial silhouette is within normal limits for a postop patient. Right upper extremity PICC with tip terminating in superior cavoatrial junction. Status post median sternotomy for CABG, tricuspid annuloplasty and mitral valve replacement (a stented bioprosthesis is noted). Atherosclerosis in the thoracic aorta. Epicardial pacing wires are noted. Orthopedic fixation hardware in the lower cervical spine incidentally noted. Previously noted right IJ Cordis has been removed. IMPRESSION: 1. Postoperative changes and support apparatus, as above. 2. Although lung volumes remain low, there is improving aeration throughout the lungs bilaterally, with areas of likely resolving postoperative subsegmental atelectasis, as above. 3. Atherosclerosis. Electronically Signed   By: Trudie Reedaniel  Entrikin M.D.   On: 10/19/2015 08:24   Dg Chest Port 1 View  10/17/2015  CLINICAL DATA:  CABG. EXAM: PORTABLE CHEST 1 VIEW COMPARISON:  10/16/2015. FINDINGS: Right IJ sheath in stable position. Interim removal of right and left chest tubes, mediastinal drainage catheter, and Swan-Ganz catheter. Prior CABG and cardiac valve replacement. Prior CABG and cardiac valve replacement. Stable cardiomegaly. Interim improvement of left mid lung field atelectasis and/or infiltrate. Persistent low lung volumes. No pleural effusion or pneumothorax . IMPRESSION: 1. Interim removal of right and left chest tubes, mediastinal drainage catheter, and Swan-Ganz catheter. Right IJ sheath in stable position. 2. Prior CABG and cardiac valve replacement.  Stable cardiomegaly. 3. Interim improvement of left mid lung field atelectasis and/or infiltrate. Electronically Signed   By: Maisie Fushomas  Register   On: 10/17/2015 07:38   Dg Chest Port 1 View  10/16/2015  CLINICAL DATA:  Status post CABG 10/13/2015. EXAM: PORTABLE CHEST 1 VIEW COMPARISON:  Single view of the chest  10/15/2015 and 10/14/2015. FINDINGS: The patient has been extubated and NG tube has been removed. Bilateral chest tubes, Swan-Ganz catheter and mediastinal drain remain in place. Focal airspace opacity in the left mid lung is again seen. The right lung is clear. No pneumothorax or pleural effusion is identified. Heart size is mildly enlarged. IMPRESSION: Status post extubation and NG tube removal. No marked change in focal airspace opacity in the left mid lung which could be due to atelectasis or pneumonia. Electronically Signed   By: Drusilla Kannerhomas  Dalessio M.D.   On: 10/16/2015 09:51   Dg Chest Port 1 View  10/15/2015  CLINICAL DATA:  70 year old male with coronary artery disease status post CABG EXAM: PORTABLE CHEST 1 VIEW COMPARISON:  Prior chest x-ray 10/14/2015 FINDINGS: The endotracheal tube is 3.5 cm above the carina. A right IJ vascular sheath convey is a Swan-Ganz catheter into the heart. Tip of the Swan overlies the right main pulmonary artery. Mediastinal and bilateral pleural drains remain present. Patient is status post median sternotomy with evidence of aortic valve replacement. Slightly improved inspiratory volumes. Persistent atelectasis versus infiltrate in the left lower lobe. No pneumothorax or pulmonary edema. No acute osseous abnormality. IMPRESSION: 1. Stable and satisfactory support apparatus. 2. Improved inspiratory volumes. 3. Persistent  patchy airspace opacity in the left lower lobe likely reflecting atelectasis. 4. No evidence of pneumothorax or pulmonary edema. Electronically Signed   By: Malachy Moan M.D.   On: 10/15/2015 09:51   Dg Chest Port 1 View  10/14/2015  CLINICAL DATA:  Post CABG EXAM: PORTABLE CHEST 1 VIEW COMPARISON:  10/14/2015 FINDINGS: Cardiomediastinal silhouette is stable. Again noted status post CABG. Stable endotracheal tube position with tip 2.6 cm above the carina. Stable NG tube position. Right IJ Swan-Ganz catheter is stable in position. Bilateral chest tubes  are unchanged in position. Again noted patchy consolidation left upper lobe and perihilar. Stable patchy infiltrate in right upper lobe. Mediastinal drain is unchanged in position. There is no pneumothorax. Stable cardiac valve prosthesis. IMPRESSION: Stable support apparatus. Again noted patchy consolidations/airspace is in left lung and right upper lobe. Post CABG. Electronically Signed   By: Natasha Mead M.D.   On: 10/14/2015 19:20   Dg Chest Port 1 View  10/14/2015  CLINICAL DATA:  Shortness of breath EXAM: PORTABLE CHEST 1 VIEW COMPARISON:  October 14, 2015 FINDINGS: The heart size and mediastinal contours are stable. Patient status post prior median sternotomy. Cardiac valvular replacement ring is unchanged. There patchy consolidations throughout the left lung unchanged. There is small patchy consolidation of the mid right lung. Bilateral chest tubes, mediastinal drain, Swan-Ganz catheter, endotracheal tube are unchanged. Nasogastric tube is identified probably coiled in the proximal stomach. The visualized skeletal structures are unremarkable. IMPRESSION: Diffuse patchy consolidation of left lung base unchanged compared prior exam. Developing patchy consolidation of right mid lung. Electronically Signed   By: Sherian Rein M.D.   On: 10/14/2015 09:52   Dg Chest Port 1 View  10/14/2015  CLINICAL DATA:  CABG EXAM: PORTABLE CHEST 1 VIEW COMPARISON:  10/13/2015 FINDINGS: Endotracheal tube remains in good position. NG tube remains in the stomach. Pericardial drain remains in place. Mediastinal drain remains in place. Bilateral chest tubes in place. No pneumothorax. Mitral valve replacement Diffuse bilateral airspace disease shows mild improvement consistent with improving edema. Hypoventilation with decreased lung volume and increased bibasilar atelectasis compared with the prior study. IMPRESSION: Support lines remain in good position.  No pneumothorax Improvement in bilateral pulmonary edema.  Hypoventilation with decreased lung volume compared with the prior study. Electronically Signed   By: Marlan Palau M.D.   On: 10/14/2015 09:15   Dg Chest Port 1 View  10/13/2015  CLINICAL DATA:  Status post CABG day 1 EXAM: PORTABLE CHEST 1 VIEW COMPARISON:  Preoperative PA and lateral chest x-ray of October 10, 2015 FINDINGS: The lungs are mildly hypo inflow would inflated. Confluent alveolar opacities are present bilaterally. The hemidiaphragms are sharp. There is no pneumothorax or significant pleural effusion. The cardiac silhouette is top-normal in size. The pulmonary vascularity is engorged and indistinct. The endotracheal tube tip lies approximately 2.9 cm above the carina. The esophagogastric tube tip projects in the gastric cardia but the proximal port is likely below the GE junction. A mediastinal drain projects to the right of the thoracic spine at approximately the T4 level. A second mediastinal drain lies horizontally at the level of the left hemidiaphragm. The right chest tube tip projects at the level of the posterior aspect of the fourth rib. The left chest tube projects between the fourth and fifth posterior ribs. The Swan-Ganz catheter tip projects in the proximal right main pulmonary artery. A prosthetic valve ring is present possibly in the tricuspid position. There are 7 intact sternal wires. IMPRESSION: 1. Pulmonary  alveolar edema greater on the left than on the right. No pleural effusion or pneumothorax. 2. The support tubes are in reasonable position. Electronically Signed   By: David  [place] M.D.   On: 10/13/2015 16:47      ASSESSMENT: Roosvelt Harps:    1) AFib: I personally reviewed tele and he is now in NSR.  TOlerating lower dose of amiodarone.   2) Diastolic heart falure: EF normal on 12/19.  Lying flat, breathing comfortably.  Titrating dose of Lasix as to not worsen renal insufficiency.  Corky Crafts, MD  10/23/2015  10:32 AM

## 2015-10-23 NOTE — Progress Notes (Addendum)
      [address]       [place] [id number]             [phone]        10 Days Post-Op Procedure(s) (LRB): CORONARY ARTERY BYPASS GRAFTING (CABG) x four using left internal mammary artery and left leg greater saphenous vein harvested endoscopically (N/A) TRANSESOPHAGEAL ECHOCARDIOGRAM (TEE) (N/A) MITRAL VALVE (MV) REPLACEMENT (N/A) TRICUSPID VALVE REPAIR (N/A)  Subjective: Patient feels better this am-no nausea.  Objective: Vital signs in last 24 hours: Temp:  [97.3 F (36.3 C)-98 F (36.7 C)] 97.9 F (36.6 C) (01/02 0558) Pulse Rate:  [77-84] 83 (01/02 0558) Cardiac Rhythm:  [-] Normal sinus rhythm (01/01 1931) Resp:  [17-18] 17 (01/02 0558) BP: (114-133)/(55-76) 133/68 mmHg (01/02 0558) SpO2:  [92 %-94 %] 92 % (01/02 0558) Weight:  [218 lb 14.4 oz (99.292 kg)] 218 lb 14.4 oz (99.292 kg) (01/02 0558)  Pre op weight 95 kg Current Weight  10/23/15 218 lb 14.4 oz (99.292 kg)      Intake/Output from previous day: 01/01 0701 - 01/02 0700 In: 480 [P.O.:480] Out: 750 [Urine:750]   Physical Exam:  Cardiovascular: RRR, no murmur Pulmonary: Diminished at bases; no rales, wheezes, or rhonchi. Abdomen: Soft, non tender, bowel sounds present. Extremities: Bilateral lower extremity edema. Wounds: Clean and dry.  No erythema or signs of infection.  Lab Results: CBC:  Recent Labs  10/21/15 0455  WBC 11.7*  HGB 8.3*  HCT 26.3*  PLT 597*   BMET:   Recent Labs  10/22/15 0601 10/23/15 0506  NA 135 134*  K 3.7 3.9  CL 99* 99*  CO2 26 28  GLUCOSE 104* 104*  BUN 24* 21*  CREATININE 2.32* 2.14*  CALCIUM 8.3* 8.3*    PT/INR:  Lab Results  Component Value Date   INR 3.10* 10/23/2015   INR 3.39* 10/22/2015   INR 3.19* 10/21/2015   ABG:  INR: Will add last result for INR, ABG once components are confirmed Will add last 4 CBG results once components are confirmed  Assessment/Plan:  1. CV - Previous a fib. SR in the 70's this am. On  Amiodarone 200 mg daily and Coumadin. INR decreased from 3.39 to 3.10. Will hold Coumadin tonight. 2.  Pulmonary - On room air. Encourage incentive spirometer. 3. Volume Overload - Continue Lasix 40 mg daily. Will give additional oral Lasix today. 4.  Acute blood loss anemia - H and H 8.3 and 26.3. Start folic acid and oral ferrous. Will check CBC in am. 5. CKD (stage III)-creatinine slightly decreased from 2.32 to 2.05. Will continue oral Lasix as is volume overloaded. 6. Will remove EPW once INR decreased 7. Gently supplement potassium 8. Will need SNF when ready for discharge  ZIMMERMAN,DONIELLE MPA-C 10/23/2015,7:32 AM    Slowly improving with main problem being fluid retention with elevated creatinine, currently 2.1 We'll give extra dose of oral Lasix today We'll hold Coumadin and hopefully remove EP W's tomorrow Plan transfer to skilled nursing facility at the time of discharge Maintaining sinus rhythm patient examined and medical record reviewed,agree with above note. Kathlee Nationseter Van Trigt III 10/23/2015

## 2015-10-23 NOTE — Care Management Important Message (Signed)
Important Message  Patient Details  Name: Jordan [name] MRN: [id number] Date of Birth: [birth year]   Medicare Important Message Given:  Yes    Rayvon CharSTUTTS, Bryson Palen G 10/23/2015, 11:26 AMImportant Message  Patient Details  Name: Jordan [name] MRN: [id number] Date of Birth: [birth year]   Medicare Important Message Given:  Yes    Ernesha Ramone G 10/23/2015, 11:25 AM

## 2015-10-24 ENCOUNTER — Inpatient Hospital Stay (HOSPITAL_COMMUNITY): Payer: PPO

## 2015-10-24 LAB — BASIC METABOLIC PANEL
Anion gap: 9 (ref 5–15)
BUN: 23 mg/dL — AB (ref 6–20)
CALCIUM: 8.5 mg/dL — AB (ref 8.9–10.3)
CO2: 26 mmol/L (ref 22–32)
Chloride: 99 mmol/L — ABNORMAL LOW (ref 101–111)
Creatinine, Ser: 2.27 mg/dL — ABNORMAL HIGH (ref 0.61–1.24)
GFR calc Af Amer: 32 mL/min — ABNORMAL LOW (ref >60)
GFR, EST NON AFRICAN AMERICAN: 27 mL/min — AB (ref >60)
GLUCOSE: 102 mg/dL — AB (ref 65–99)
Potassium: 3.9 mmol/L (ref 3.5–5.1)
Sodium: 134 mmol/L — ABNORMAL LOW (ref 135–145)

## 2015-10-24 LAB — CBC
HCT: 27.8 % — ABNORMAL LOW (ref 39.0–52.0)
Hemoglobin: 8.7 g/dL — ABNORMAL LOW (ref 13.0–17.0)
MCH: 26.9 pg (ref 26.0–34.0)
MCHC: 31.3 g/dL (ref 30.0–36.0)
MCV: 86.1 fL (ref 78.0–100.0)
PLATELETS: 735 10*3/uL — AB (ref 150–400)
RBC: 3.23 MIL/uL — AB (ref 4.22–5.81)
RDW: 16.6 % — ABNORMAL HIGH (ref 11.5–15.5)
WBC: 11 10*3/uL — ABNORMAL HIGH (ref 4.0–10.5)

## 2015-10-24 LAB — PROTIME-INR
INR: 2.64 — AB (ref 0.00–1.49)
PROTHROMBIN TIME: 27.8 s — AB (ref 11.6–15.2)

## 2015-10-24 MED ORDER — FUROSEMIDE 40 MG PO TABS
40.0000 mg | ORAL_TABLET | Freq: Every day | ORAL | Status: DC
  Administered 2015-10-24 – 2015-10-25 (×2): 40 mg via ORAL
  Filled 2015-10-24 (×2): qty 1

## 2015-10-24 MED ORDER — WARFARIN SODIUM 2.5 MG PO TABS
2.5000 mg | ORAL_TABLET | Freq: Once | ORAL | Status: AC
  Administered 2015-10-24: 2.5 mg via ORAL
  Filled 2015-10-24: qty 1

## 2015-10-24 NOTE — Progress Notes (Signed)
SUBJECTIVE:  No acute events noted.  Feels like he is getting stronger.  Walked with rehab.  OBJECTIVE:   Vitals:   Filed Vitals:   10/23/15 2014 10/24/15 0008 10/24/15 0450 10/24/15 0553  BP: 108/66   114/75  Pulse: 82 86  81  Temp: 98.7 F (37.1 C)   97.9 F (36.6 C)  TempSrc: Oral   Oral  Resp: 18 18  18   Height:      Weight:   214 lb 15.2 oz (97.5 kg)   SpO2: 95% 95%  93%   I&O's:    Intake/Output Summary (Last 24 hours) at 10/24/15 1105 Last data filed at 10/24/15 4098  Gross per 24 hour  Intake    860 ml  Output    552 ml  Net    308 ml   TELEMETRY: Reviewed telemetry pt in NSR:     PHYSICAL EXAM General: Well developed, well nourished, in no acute distress Head:   Normal cephalic and atramatic  Lungs:  No wheezing. Heart:   HRRR S1 S2  No JVD.   Abdomen: obese No edema  Skin: No rash   LABS: Basic Metabolic Panel:  Recent Labs  11/91/47 0506 10/24/15 0440  NA 134* 134*  K 3.9 3.9  CL 99* 99*  CO2 28 26  GLUCOSE 104* 102*  BUN 21* 23*  CREATININE 2.14* 2.27*  CALCIUM 8.3* 8.5*   Liver Function Tests: No results for input(s): AST, ALT, ALKPHOS, BILITOT, PROT, ALBUMIN in the last 72 hours. No results for input(s): LIPASE, AMYLASE in the last 72 hours. CBC:  Recent Labs  10/24/15 0440  WBC 11.0*  HGB 8.7*  HCT 27.8*  MCV 86.1  PLT 735*   Cardiac Enzymes: No results for input(s): CKTOTAL, CKMB, CKMBINDEX, TROPONINI in the last 72 hours. BNP: Invalid input(s): POCBNP D-Dimer: No results for input(s): DDIMER in the last 72 hours. Hemoglobin A1C: No results for input(s): HGBA1C in the last 72 hours. Fasting Lipid Panel: No results for input(s): CHOL, HDL, LDLCALC, TRIG, CHOLHDL, LDLDIRECT in the last 72 hours. Thyroid Function Tests: No results for input(s): TSH, T4TOTAL, T3FREE, THYROIDAB in the last 72 hours.  Invalid input(s): FREET3 Anemia Panel: No results for input(s): VITAMINB12, FOLATE, FERRITIN, TIBC, IRON, RETICCTPCT  in the last 72 hours. Coag Panel:   Lab Results  Component Value Date   INR 2.64* 10/24/2015   INR 3.10* 10/23/2015   INR 3.39* 10/22/2015    RADIOLOGY: Dg Chest 2 View  10/24/2015  CLINICAL DATA:  Pleural effusion.  Postop cardiac surgery. EXAM: CHEST  2 VIEW COMPARISON:  10/19/2015. FINDINGS: Median sternotomy for CABG. Mild scarring and subsegmental atelectasis LEFT base with small effusion, expected postoperative findings. Trace effusion on the RIGHT. PICC line tip cavoatrial junction. Epicardial pacing wires. Previous cervical fusion. Ileus pattern. IMPRESSION: Improved but persistent LEFT base opacity. Normal-appearing cardiomediastinal silhouette. Electronically Signed   By: Elsie Stain M.D.   On: 10/24/2015 07:41   Dg Chest 2 View  10/10/2015  CLINICAL DATA:  CHF, chronic renal insufficiency EXAM: CHEST  2 VIEW COMPARISON:  PA and lateral chest x-ray of October 08, 2015 FINDINGS: The lungs are adequately inflated. The interstitial markings remain increased bilaterally. Areas of confluent alveolar opacity persist in the perihilar regions. There is mild tenting of the left hemidiaphragm. There is no pneumothorax. There are small bilateral pleural effusions blunting the posterior costophrenic angles. The mediastinum is normal in width. The bony thorax exhibits no acute abnormality. IMPRESSION: CHF  with mild pulmonary interstitial edema and small bilateral pleural effusions. There has not been significant change since yesterday's study. Electronically Signed   By: David  SwazilandJordan M.D.   On: 10/10/2015 07:44   Dg Chest 2 View  10/08/2015  CLINICAL DATA:  Shortness of breath and chest pain EXAM: CHEST  2 VIEW COMPARISON:  08/30/2015 FINDINGS: Diffuse interstitial coarsening with fairly symmetric perihilar airspace opacity. There are Kerley lines best visualized in the lateral projection, where small pleural effusions are also seen bilaterally. Normal heart size and stable aortic and hilar  contours. IMPRESSION: Pulmonary edema and small pleural effusions. Electronically Signed   By: Marnee SpringJonathon  Watts M.D.   On: 10/08/2015 22:56   Dg Chest Port 1 View  10/19/2015  CLINICAL DATA:  70 year old male status post CABG, tricuspid valve repair and mitral valve replacement. EXAM: PORTABLE CHEST 1 VIEW COMPARISON:  Chest x-ray 10/17/2015. FINDINGS: Lung volumes remain low, but have improved slightly. Improving aeration in the right lung. There continues to be opacities throughout the mid to lower left lung, likely to reflect resolving postoperative subsegmental atelectasis. No confluent consolidative airspace disease. No pleural effusions. No evidence of pulmonary edema. No pneumothorax. Cardiopericardial silhouette is within normal limits for a postop patient. Right upper extremity PICC with tip terminating in superior cavoatrial junction. Status post median sternotomy for CABG, tricuspid annuloplasty and mitral valve replacement (a stented bioprosthesis is noted). Atherosclerosis in the thoracic aorta. Epicardial pacing wires are noted. Orthopedic fixation hardware in the lower cervical spine incidentally noted. Previously noted right IJ Cordis has been removed. IMPRESSION: 1. Postoperative changes and support apparatus, as above. 2. Although lung volumes remain low, there is improving aeration throughout the lungs bilaterally, with areas of likely resolving postoperative subsegmental atelectasis, as above. 3. Atherosclerosis. Electronically Signed   By: Trudie Reedaniel  Entrikin M.D.   On: 10/19/2015 08:24   Dg Chest Port 1 View  10/17/2015  CLINICAL DATA:  CABG. EXAM: PORTABLE CHEST 1 VIEW COMPARISON:  10/16/2015. FINDINGS: Right IJ sheath in stable position. Interim removal of right and left chest tubes, mediastinal drainage catheter, and Swan-Ganz catheter. Prior CABG and cardiac valve replacement. Prior CABG and cardiac valve replacement. Stable cardiomegaly. Interim improvement of left mid lung field  atelectasis and/or infiltrate. Persistent low lung volumes. No pleural effusion or pneumothorax . IMPRESSION: 1. Interim removal of right and left chest tubes, mediastinal drainage catheter, and Swan-Ganz catheter. Right IJ sheath in stable position. 2. Prior CABG and cardiac valve replacement.  Stable cardiomegaly. 3. Interim improvement of left mid lung field atelectasis and/or infiltrate. Electronically Signed   By: Maisie Fushomas  Register   On: 10/17/2015 07:38   Dg Chest Port 1 View  10/16/2015  CLINICAL DATA:  Status post CABG 10/13/2015. EXAM: PORTABLE CHEST 1 VIEW COMPARISON:  Single view of the chest 10/15/2015 and 10/14/2015. FINDINGS: The patient has been extubated and NG tube has been removed. Bilateral chest tubes, Swan-Ganz catheter and mediastinal drain remain in place. Focal airspace opacity in the left mid lung is again seen. The right lung is clear. No pneumothorax or pleural effusion is identified. Heart size is mildly enlarged. IMPRESSION: Status post extubation and NG tube removal. No marked change in focal airspace opacity in the left mid lung which could be due to atelectasis or pneumonia. Electronically Signed   By: Drusilla Kannerhomas  Dalessio M.D.   On: 10/16/2015 09:51   Dg Chest Port 1 View  10/15/2015  CLINICAL DATA:  70 year old male with coronary artery disease status post CABG EXAM: PORTABLE  CHEST 1 VIEW COMPARISON:  Prior chest x-ray 10/14/2015 FINDINGS: The endotracheal tube is 3.5 cm above the carina. A right IJ vascular sheath convey is a Swan-Ganz catheter into the heart. Tip of the Swan overlies the right main pulmonary artery. Mediastinal and bilateral pleural drains remain present. Patient is status post median sternotomy with evidence of aortic valve replacement. Slightly improved inspiratory volumes. Persistent atelectasis versus infiltrate in the left lower lobe. No pneumothorax or pulmonary edema. No acute osseous abnormality. IMPRESSION: 1. Stable and satisfactory support apparatus.  2. Improved inspiratory volumes. 3. Persistent patchy airspace opacity in the left lower lobe likely reflecting atelectasis. 4. No evidence of pneumothorax or pulmonary edema. Electronically Signed   By: Malachy Moan M.D.   On: 10/15/2015 09:51   Dg Chest Port 1 View  10/14/2015  CLINICAL DATA:  Post CABG EXAM: PORTABLE CHEST 1 VIEW COMPARISON:  10/14/2015 FINDINGS: Cardiomediastinal silhouette is stable. Again noted status post CABG. Stable endotracheal tube position with tip 2.6 cm above the carina. Stable NG tube position. Right IJ Swan-Ganz catheter is stable in position. Bilateral chest tubes are unchanged in position. Again noted patchy consolidation left upper lobe and perihilar. Stable patchy infiltrate in right upper lobe. Mediastinal drain is unchanged in position. There is no pneumothorax. Stable cardiac valve prosthesis. IMPRESSION: Stable support apparatus. Again noted patchy consolidations/airspace is in left lung and right upper lobe. Post CABG. Electronically Signed   By: Natasha Mead M.D.   On: 10/14/2015 19:20   Dg Chest Port 1 View  10/14/2015  CLINICAL DATA:  Shortness of breath EXAM: PORTABLE CHEST 1 VIEW COMPARISON:  October 14, 2015 FINDINGS: The heart size and mediastinal contours are stable. Patient status post prior median sternotomy. Cardiac valvular replacement ring is unchanged. There patchy consolidations throughout the left lung unchanged. There is small patchy consolidation of the mid right lung. Bilateral chest tubes, mediastinal drain, Swan-Ganz catheter, endotracheal tube are unchanged. Nasogastric tube is identified probably coiled in the proximal stomach. The visualized skeletal structures are unremarkable. IMPRESSION: Diffuse patchy consolidation of left lung base unchanged compared prior exam. Developing patchy consolidation of right mid lung. Electronically Signed   By: Sherian Rein M.D.   On: 10/14/2015 09:52   Dg Chest Port 1 View  10/14/2015  CLINICAL DATA:   CABG EXAM: PORTABLE CHEST 1 VIEW COMPARISON:  10/13/2015 FINDINGS: Endotracheal tube remains in good position. NG tube remains in the stomach. Pericardial drain remains in place. Mediastinal drain remains in place. Bilateral chest tubes in place. No pneumothorax. Mitral valve replacement Diffuse bilateral airspace disease shows mild improvement consistent with improving edema. Hypoventilation with decreased lung volume and increased bibasilar atelectasis compared with the prior study. IMPRESSION: Support lines remain in good position.  No pneumothorax Improvement in bilateral pulmonary edema. Hypoventilation with decreased lung volume compared with the prior study. Electronically Signed   By: Marlan Palau M.D.   On: 10/14/2015 09:15   Dg Chest Port 1 View  10/13/2015  CLINICAL DATA:  Status post CABG day 1 EXAM: PORTABLE CHEST 1 VIEW COMPARISON:  Preoperative PA and lateral chest x-ray of October 10, 2015 FINDINGS: The lungs are mildly hypo inflow would inflated. Confluent alveolar opacities are present bilaterally. The hemidiaphragms are sharp. There is no pneumothorax or significant pleural effusion. The cardiac silhouette is top-normal in size. The pulmonary vascularity is engorged and indistinct. The endotracheal tube tip lies approximately 2.9 cm above the carina. The esophagogastric tube tip projects in the gastric cardia but the proximal port  is likely below the GE junction. A mediastinal drain projects to the right of the thoracic spine at approximately the T4 level. A second mediastinal drain lies horizontally at the level of the left hemidiaphragm. The right chest tube tip projects at the level of the posterior aspect of the fourth rib. The left chest tube projects between the fourth and fifth posterior ribs. The Swan-Ganz catheter tip projects in the proximal right main pulmonary artery. A prosthetic valve ring is present possibly in the tricuspid position. There are 7 intact sternal wires.  IMPRESSION: 1. Pulmonary alveolar edema greater on the left than on the right. No pleural effusion or pneumothorax. 2. The support tubes are in reasonable position. Electronically Signed   By: David  Swaziland M.D.   On: 10/13/2015 16:47      ASSESSMENT: Jordan [name]:    1) AFib: I personally reviewed tele and he is now in NSR.  Tolerating lower dose of amiodarone.   2) Diastolic heart falure: EF normal on 12/19.  Lying flat, breathing comfortably.  Titrating dose of Lasix as to not worsen renal insufficiency.  Cr slightly increased this AM after Lasix 40 mg daily.  However, within the general Cr range of the last few days.  Continue to follow.  Plan is to d/c to rehab, perhaps tomorrow per his report.  Jordan Crafts, MD  10/24/2015  11:05 AM

## 2015-10-24 NOTE — Progress Notes (Signed)
CARDIAC REHAB PHASE I   PRE:  Rate/Rhythm: 84 SR  BP:  Sitting: 108/75        SaO2: 95 RA  MODE:  Ambulation: 300 ft   POST:  Rate/Rhythm: 98 SR  BP:  Sitting: 117/77         SaO2: 98 RA  Pt ambulated 300 ft on RA, hand held assist, fairly steady gait, tolerated well. Pt c/o DOE, denies pain, dizziness, brief standing rest x1. Sats 99% on RA during ambulation. Encouraged IS, additional ambulation. Pt verbalized understanding. Pt to edge of bed after walk, (declines to sit up in recliner, states it is uncomfortable), call bell within reach. Will follow.   [phone] Joylene GrapesMonge, Akari Crysler C, RN, BSN 10/24/2015 10:32 AM

## 2015-10-24 NOTE — Progress Notes (Signed)
Utilization review completed.  

## 2015-10-24 NOTE — Progress Notes (Signed)
CSW spoke with pt and pt wife concerning SNF placement.  Pt and wife would like [facility] as their choice- facility can accept pending insurance auth and availability  Per PT pt is basically independent with mobility and walking 46400ft- CSW explained that because there are no other skillable needs it is possible that pt will not be approved by insurance  CSW asked insurance worker to start auth process to determine if pt will be approved- insurance rep looked at pt case and will not start auth process till tomorrow when pt is expected to DC.  Pt expressed that he would prefer to go to SNF but felt as if he could manage at home if he had to- did express that it would be impossible to get 24 hour supervision due to wife work schedule.  CSW spoke with wife would also expressed preference for pt to go to facility but stated that he would be able to live with her for 2 weeks after DC (they have been separated for 4 years and living in different houses) and that she would come home from work once during the day to check on him if needed.  CSW will continue to follow and insurance has planned to reassess pt for SNF tomorrow  Merlyn LotJenna Holoman, [facility] LL[facility]SWA Clinical Social Worker [phone]

## 2015-10-24 NOTE — Consult Note (Signed)
   [facility]   10/24/2015  [facility] 01-31-1946 [id number] Came by to see the patient and nursing finishing up some care.  Patient holding his pillow and states, "I've hit a rough patch, right now."  Told patient this writer will follow up, as appropriate.  [facility] through his  [employer] Medicare plan but,  clearly very uncomfortable at this time. Will follow up with inpatient RNCM as well.  For questions, please contact: Charlesetta ShanksVictoria Artem Bunte, RN BSN CCM [facility] Liaison  [phone] business mobile phone

## 2015-10-24 NOTE — Progress Notes (Addendum)
RT NOTE:  RT called to give PRN for patient having difficulty clearing secretions. Dr. Donata ClayVan Trigt ordered PRN Xopenex. Pt had IS & Flutter on bedside table. Upon treatment completion, RT educated patient with IS and Flutter therapy. Recommended 10x per hour and as tolerated. PT understands therapy and displays understanding.

## 2015-10-24 NOTE — Progress Notes (Addendum)
      [address]       [place] [id number]             [phone]        11 Days Post-Op Procedure(s) (LRB): CORONARY ARTERY BYPASS GRAFTING (CABG) x four using left internal mammary artery and left leg greater saphenous vein harvested endoscopically (N/A) TRANSESOPHAGEAL ECHOCARDIOGRAM (TEE) (N/A) MITRAL VALVE (MV) REPLACEMENT (N/A) TRICUSPID VALVE REPAIR (N/A)  Subjective: Patient feels better this am-no nausea.  Objective: Vital signs in last 24 hours: Temp:  [97.9 F (36.6 C)-98.7 F (37.1 C)] 97.9 F (36.6 C) (01/03 0553) Pulse Rate:  [81-86] 81 (01/03 0553) Cardiac Rhythm:  [-] Normal sinus rhythm (01/02 1900) Resp:  [18] 18 (01/03 0553) BP: (94-114)/(56-75) 114/75 mmHg (01/03 0553) SpO2:  [93 %-95 %] 93 % (01/03 0553) Weight:  [214 lb 15.2 oz (97.5 kg)] 214 lb 15.2 oz (97.5 kg) (01/03 0450)  Pre op weight 95 kg Current Weight  10/24/15 214 lb 15.2 oz (97.5 kg)      Intake/Output from previous day: 01/02 0701 - 01/03 0700 In: 860 [P.O.:840; I.V.:20] Out: 1202 [Urine:1200; Stool:2]   Physical Exam:  Cardiovascular: RRR, no murmur Pulmonary: Diminished at bases; no rales, wheezes, or rhonchi. Abdomen: Soft, non tender, bowel sounds present. Extremities: Bilateral lower extremity edema. Wounds: Clean and dry.  No erythema or signs of infection.  Lab Results: CBC:  Recent Labs  10/24/15 0440  WBC 11.0*  HGB 8.7*  HCT 27.8*  PLT 735*   BMET:   Recent Labs  10/23/15 0506 10/24/15 0440  NA 134* 134*  K 3.9 3.9  CL 99* 99*  CO2 28 26  GLUCOSE 104* 102*  BUN 21* 23*  CREATININE 2.14* 2.27*  CALCIUM 8.3* 8.5*    PT/INR:  Lab Results  Component Value Date   INR 2.64* 10/24/2015   INR 3.10* 10/23/2015   INR 3.39* 10/22/2015   ABG:  INR: Will add last result for INR, ABG once components are confirmed Will add last 4 CBG results once components are confirmed  Assessment/Plan:  1. CV - Previous a fib. Continue to  maintain SR. On Amiodarone 200 mg daily and Coumadin. INR decreased from 3.10 to 2.64. He did not have Coumadin last night and received 1 mg on 12/31 and 1/1. Will likely need Coumadin 2.5 mg daily. 2.  Pulmonary - On room air. CXR shows small bilateral  pleural effusions and left base atelectasis.Encourage incentive spirometer. 3. Volume Overload - Continue Lasix 40 mg daily.  4.  Acute blood loss anemia - H and H 8.7 and 27.8. Continue folic acid and oral ferrous.  5. CKD (stage III)-creatinine slightly decreased from 2.14 to 2.27.  6. Will remove EPW  7. Will need SNF when ready for discharge-sometime this week  ZIMMERMAN,DONIELLE MPA-C 10/24/2015,7:41 AM    Chart reviewed, patient examined, agree with above. He is still complaining of thick secretions that he is trying to cough up. He is maintaining sats on room air and ambulating fairly well. Bowels working. Wt. Is decreasing and only 4 lbs over preop wt. Still has mild ankle edema. Switch lasix to daily. INR decreased after 1 mg the past two days. Will remove pacing wires and give 2.5 mg tonight. He could go to SNF tomorrow if he is feeling up to it.

## 2015-10-25 LAB — PROTIME-INR
INR: 2.51 — ABNORMAL HIGH (ref 0.00–1.49)
Prothrombin Time: 26.8 seconds — ABNORMAL HIGH (ref 11.6–15.2)

## 2015-10-25 MED ORDER — FUROSEMIDE 40 MG PO TABS: 40.0000 mg | ORAL_TABLET | Freq: Every day | ORAL | Status: DC

## 2015-10-25 MED ORDER — FOLIC ACID 1 MG PO TABS: 1.0000 mg | ORAL_TABLET | Freq: Every day | ORAL | Status: DC

## 2015-10-25 MED ORDER — ASPIRIN 81 MG PO TBEC: 81.0000 mg | DELAYED_RELEASE_TABLET | Freq: Every day | ORAL | Status: DC

## 2015-10-25 MED ORDER — WARFARIN SODIUM 2.5 MG PO TABS: 2.5000 mg | ORAL_TABLET | Freq: Every day | ORAL | Status: DC

## 2015-10-25 MED ORDER — GUAIFENESIN ER 600 MG PO TB12: 600.0000 mg | ORAL_TABLET | Freq: Two times a day (BID) | ORAL | Status: DC

## 2015-10-25 MED ORDER — OXYCODONE HCL 5 MG PO TABS: 5.0000 mg | ORAL_TABLET | ORAL | Status: DC | PRN

## 2015-10-25 MED ORDER — ATORVASTATIN CALCIUM 10 MG PO TABS: 10.0000 mg | ORAL_TABLET | Freq: Every day | ORAL | Status: DC

## 2015-10-25 MED ORDER — AMIODARONE HCL 200 MG PO TABS: 200.0000 mg | ORAL_TABLET | Freq: Every day | ORAL | Status: DC

## 2015-10-25 MED ORDER — FERROUS SULFATE 325 (65 FE) MG PO TABS: 325.0000 mg | ORAL_TABLET | Freq: Every day | ORAL | Status: DC

## 2015-10-25 NOTE — Consult Note (Signed)
   [facility] Inpatient Consult   10/25/2015  Welford Christmas [facility] [birth year] [phone] Spoke with inpatient CSW regarding disposition needs. Also, spoke with [facility] regarding a request for disposition assessment for a skilled facility need.  Peer-to-peer request is in process. Met and spoke with patient at the bedside. Bryan Lemma that [facility] is a covered benefit of insurance.   Met with the patient regarding the benefits of [facility] services.  Explained that [facility] does not interfere with or replace any services arranged by the inpatient care management staff and how to assist with post hospital follow up..  Patient did not want to consent to care yet stating, "There are too many players in the game right now and I don't know where I am going, yet."  Patient requested a brochure to share with his wife.  Patient states he does not have 24 hour support at home to recuperate per MD recommendations.  He states he is not suppose to drive to appointments. Patient requested to let him think about everything as he has had so much information given to him today. Brochure given to the patient with contact information.  For questions, please contact: Natividad Brood, RN BSN [facility] Liaison  [phone] business mobile phone

## 2015-10-25 NOTE — Progress Notes (Signed)
EPWs removed per order and unit protocol.  Pt tolerated well, all tips intact, sites painted.  VSS, see flowsheet.  Pt understands bedrest for one hr with frequent VS checks.  CCMD notified, will monitor closely.

## 2015-10-25 NOTE — Progress Notes (Addendum)
Nutrition Follow Up  DOCUMENTATION CODES:   Obesity unspecified  INTERVENTION:    Continue Heart Healthy/Carbohydrate Modified diet  NEW NUTRITION DIAGNOSIS:   Increased nutrient needs related to  (post-op healing) as evidenced by estimated needs, ongoing  GOAL:   Patient will meet greater than or equal to 90% of their needs, progressing   MONITOR:   PO intake, Labs, Weight trends, I & O's  ASSESSMENT:   [name] admitted for SOB and chest and back pain. Followed by Dr Eliott Nineunham for CKD 3/4 sec to HTN.  Patient s/p procedures: CORONARY ARTERY BYPASS GRAFTING (CABG) x 4 MITRAL VALVE (MV) REPLACEMENT  TRICUSPID VALVE REPAIR  Patient continues Heart Healthy/Carbohydrate Modified diet.  PO intake improved but still remains variable at 25-100% per flowsheet records.  Declined addition or oral nutrition supplements.   Diet Order:  Diet heart healthy/carb modified Room service appropriate?: Yes; Fluid consistency:: Thin  Skin:  Reviewed, no issues  Last BM:  1/3  Height:   Ht Readings from Last 1 Encounters:  10/13/15 5\' 6"  (1.676 m)    Weight:   Wt Readings from Last 1 Encounters:  10/25/15 212 lb 8 oz (96.389 kg)    Ideal Body Weight:  64.5 kg  BMI:  Body mass index is 34.31 kg/(m^2).  Estimated Nutritional Needs:   Kcal:  1800-2000  Protein:  100-110 gm  Fluid:  per MD  EDUCATION NEEDS:   No education needs identified at this time  Maureen ChattersKatie Damontre Millea, RD, LDN Pager #: [phone] After-Hours Pager #: [phone]

## 2015-10-25 NOTE — Progress Notes (Signed)
LOG placement approved by supervisor for likely insurance denial- [facility]  [facility] Anticipated discharge date:1/4 Family notified:wife Transportation by wife- pick up around 3pm Report #: [phone]  CSW signing off.  Merlyn LotJenna Holoman, LCSWA Clinical Social Worker [phone]

## 2015-10-25 NOTE — Progress Notes (Addendum)
      [address]       [place] [id number]             [phone]        12 Days Post-Op Procedure(s) (LRB): CORONARY ARTERY BYPASS GRAFTING (CABG) x four using left internal mammary artery and left leg greater saphenous vein harvested endoscopically (N/A) TRANSESOPHAGEAL ECHOCARDIOGRAM (TEE) (N/A) MITRAL VALVE (MV) REPLACEMENT (N/A) TRICUSPID VALVE REPAIR (N/A)  Subjective: Patient states able to clear cough better with Mucinex. "I am ready for SNF".  Objective: Vital signs in last 24 hours: Temp:  [98.1 F (36.7 C)-98.3 F (36.8 C)] 98.2 F (36.8 C) (01/04 0511) Pulse Rate:  [82-86] 83 (01/04 0511) Cardiac Rhythm:  [-] Normal sinus rhythm (01/03 1918) Resp:  [18-19] 18 (01/04 0511) BP: (102-107)/(65-70) 107/70 mmHg (01/04 0511) SpO2:  [95 %-99 %] 99 % (01/04 0511) Weight:  [212 lb 8 oz (96.389 kg)] 212 lb 8 oz (96.389 kg) (01/04 0511)  Pre op weight 95 kg Current Weight  10/25/15 212 lb 8 oz (96.389 kg)      Intake/Output from previous day: 01/03 0701 - 01/04 0700 In: 720 [P.O.:720] Out: -    Physical Exam:  Cardiovascular: RRR, no murmur Pulmonary: Mostly clear Abdomen: Soft, non tender, bowel sounds present. Extremities: Mild bilateral lower extremity edema. Wounds: Clean and dry.  No erythema or signs of infection.  Lab Results: CBC:  Recent Labs  10/24/15 0440  WBC 11.0*  HGB 8.7*  HCT 27.8*  PLT 735*   BMET:   Recent Labs  10/23/15 0506 10/24/15 0440  NA 134* 134*  K 3.9 3.9  CL 99* 99*  CO2 28 26  GLUCOSE 104* 102*  BUN 21* 23*  CREATININE 2.14* 2.27*  CALCIUM 8.3* 8.5*    PT/INR:  Lab Results  Component Value Date   INR 2.51* 10/25/2015   INR 2.64* 10/24/2015   INR 3.10* 10/23/2015   ABG:  INR: Will add last result for INR, ABG once components are confirmed Will add last 4 CBG results once components are confirmed  Assessment/Plan:  1. CV - Previous a fib. Continue to maintain SR. On Amiodarone 200  mg daily and Coumadin. INR decreased from 2.64 to 2.51.  Will likely need Coumadin 2.5 mg daily. 2.  Pulmonary - On room air. Mucinex for cough. Encourage incentive spirometer. 3. Volume Overload - Continue Lasix 40 mg daily.  4.  Acute blood loss anemia - H and H 8.7 and 27.8. Continue folic acid and oral ferrous.  5. CKD (stage III)-creatinine slightly decreased from 2.14 to 2.27.  6. Remove EPW 7. Will switch Zocor to Lipitor as is on Amiodarone 8. Will need SNF when ready for discharge  Harini Dearmond MPA-C 10/25/2015,7:38 AM

## 2015-10-25 NOTE — Progress Notes (Signed)
[id number] Education completed with pt who voiced understanding. Put on discharge video for him to view after ed. Reviewed sternal precautions, IS and flutter valve, ex and encouraged heart healthy diet. Discussed CRP 2 and referring to [place]. Pt stated he is going to facility, but if he does not, will need rolling walker at discharge. Talked with PT who is to see pt later. Pt on bedrest due to PICC line removal right now. Luetta Nuttingharlene Taquita Demby RN BSN 10/25/2015 10:10 AM

## 2015-10-26 ENCOUNTER — Non-Acute Institutional Stay (SKILLED_NURSING_FACILITY): Payer: PPO | Admitting: Internal Medicine

## 2015-10-26 ENCOUNTER — Encounter: Payer: Self-pay | Admitting: Internal Medicine

## 2015-10-26 DIAGNOSIS — I071 Rheumatic tricuspid insufficiency: Secondary | ICD-10-CM

## 2015-10-26 DIAGNOSIS — D62 Acute posthemorrhagic anemia: Secondary | ICD-10-CM | POA: Diagnosis not present

## 2015-10-26 DIAGNOSIS — Z951 Presence of aortocoronary bypass graft: Secondary | ICD-10-CM | POA: Diagnosis not present

## 2015-10-26 DIAGNOSIS — N183 Chronic kidney disease, stage 3 unspecified: Secondary | ICD-10-CM

## 2015-10-26 DIAGNOSIS — I5031 Acute diastolic (congestive) heart failure: Secondary | ICD-10-CM | POA: Diagnosis not present

## 2015-10-26 DIAGNOSIS — I4891 Unspecified atrial fibrillation: Secondary | ICD-10-CM | POA: Diagnosis not present

## 2015-10-26 DIAGNOSIS — E785 Hyperlipidemia, unspecified: Secondary | ICD-10-CM

## 2015-10-26 DIAGNOSIS — I214 Non-ST elevation (NSTEMI) myocardial infarction: Secondary | ICD-10-CM

## 2015-10-26 DIAGNOSIS — I110 Hypertensive heart disease with heart failure: Secondary | ICD-10-CM

## 2015-10-26 NOTE — Progress Notes (Signed)
MRN: [id number] Name: Jordan [name] [facility]  Sex: male Age: 71 y.o. DOB: 1946-08-08  PSC #: Ronni Rumble Facility/Room:110 Level Of Care: SNF Provider: Merrilee Seashore D Emergency Contacts: Extended Emergency Contact Information Primary Emergency Contact: Tooley,Linda Address: [address]           [facility] [place], [place] [address] [place] Home Phone: [phone] Mobile Phone: [phone] Relation: Spouse  Code Status:   Allergies: Review of patient's allergies indicates no known allergies.  Chief Complaint  Patient presents with  . New Admit To SNF    HPI: Patient is 70 y.o. male who presented to the emergency room  primarily at the request of his ex-wife because of worsening weakness, shortness of breath and pain. He was weak and stated that he has been sick for over a week. He had also had back and shoulder pain.  In ED he was noted to be in pulmonary edema with a troponn of 5. Pt was admitted.  to [facility] from 12/18-10/25/2015 where pt was dx with NSTEMI, MVRegurg,TVRegurg and, acute CHF and AF with RVR. Pt underwent a CABG X 4 with TV repair and MV replacement.Pt is admitted to SNF for OT/PT. While at SNF pt will be followed for HLD, tx with lipitor, AF, tx with amiodarone and coumadin, and GERD, tx with prevacid.  Past Medical History  Diagnosis Date  . Hypertension   . Kidney stone   . Bipolar 1 disorder (HCC)   . Diverticulitis   . Anxiety   . Arthritis   . Sleep apnea   . Chronic headaches   . Stage III chronic kidney disease     Baseline creatinine 1.8  . Left inguinal hernia   . Atherosclerosis   . CAD (coronary artery disease)     a. 10/11/2015: total occlusion of OM1 and LCx, 50% stenosis in distal LM, 80% stenosis in LAD, 99% stenosis in RCA --> CABG recommended.  . Hiatal hernia   . 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 Antritis (stomach)     Past Surgical History  Procedure Laterality Date  . Appendectomy      70 years old  . Partial colectomy     . Spine surgery  2013  . Anterior cervical decomp/discectomy fusion N/A 12/04/2012    Procedure: ANTERIOR CERVICAL DECOMPRESSION/DISCECTOMY FUSION 2 LEVELS;  Surgeon: Temple Pacini, MD;  Location: [facility];  Service: Neurosurgery;  Laterality: N/A;  Cervical five-six, six-seven anterior cervical decompression/discectomy fusion with allograft and plating  . Esophagogastroduodenoscopy N/A 05/06/2015    Procedure: ESOPHAGOGASTRODUODENOSCOPY (EGD);  Surgeon: Vida Rigger, MD;  Location: [facility];  Service: Endoscopy;  Laterality: N/A;  . Cardiac catheterization N/A 10/11/2015    Procedure: Right/Left Heart Cath and Coronary Angiography;  Surgeon: Lyn Records, MD;  Location: [facility] INVASIVE CV LAB;  Service: Cardiovascular;  Laterality: N/A;  . Coronary artery bypass graft N/A 10/13/2015    Procedure: CORONARY ARTERY BYPASS GRAFTING (CABG) x four using left internal mammary artery and left leg greater saphenous vein harvested endoscopically;  Surgeon: [name], MD;  Location: [facility] OR;  Service: Open Heart Surgery;  Laterality: N/A;  . Tee without cardioversion N/A 10/13/2015    Procedure: TRANSESOPHAGEAL ECHOCARDIOGRAM (TEE);  Surgeon: [name], MD;  Location: [facility] OR;  Service: Open Heart Surgery;  Laterality: N/A;  . Mitral valve replacement N/A 10/13/2015    Procedure: MITRAL VALVE (MV) REPLACEMENT;  Surgeon: [name], MD;  Location: [facility] OR;  Service: Open Heart  Surgery;  Laterality: N/A;  . Tricuspid valve replacement N/A 10/13/2015    Procedure: TRICUSPID VALVE REPAIR;  Surgeon: [name], MD;  Location: [facility] OR;  Service: Open Heart Surgery;  Laterality: N/A;      Medication List       This list is accurate as of: 10/26/15 11:59 PM.  Always use your most recent med list.               amiodarone 200 MG tablet  Commonly known as:  PACERONE  Take 1 tablet (200 mg total) by mouth daily.     aspirin 81 MG EC tablet  Take 1 tablet (81 mg total) by mouth daily.      atorvastatin 10 MG tablet  Commonly known as:  LIPITOR  Take 1 tablet (10 mg total) by mouth daily.     ferrous sulfate 325 (65 FE) MG tablet  Take 1 tablet (325 mg total) by mouth daily with breakfast. For one month then stop.     folic acid 1 MG tablet  Commonly known as:  FOLVITE  Take 1 tablet (1 mg total) by mouth daily. For one month then stop.     furosemide 40 MG tablet  Commonly known as:  LASIX  Take 1 tablet (40 mg total) by mouth daily.     guaiFENesin 600 MG 12 hr tablet  Commonly known as:  MUCINEX  Take 1 tablet (600 mg total) by mouth 2 (two) times daily. May stop after discharge from SNF.     lansoprazole 15 MG capsule  Commonly known as:  PREVACID  Take 1 capsule (15 mg total) by mouth daily at 12 noon.     oxyCODONE 5 MG immediate release tablet  Commonly known as:  Oxy IR/ROXICODONE  Take 1-2 tablets (5-10 mg total) by mouth every 4 (four) hours as needed for severe pain.     warfarin 2.5 MG tablet  Commonly known as:  COUMADIN  Take 1 tablet (2.5 mg total) by mouth daily. Or as directed.        No orders of the defined types were placed in this encounter.     There is no immunization history on file for this patient.  Social History  Substance Use Topics  . Smoking status: Former Smoker -- 1.50 packs/day for 20 years    Quit date: 10/22/1983  . Smokeless tobacco: Never Used  . Alcohol Use: No    Family history is + DM, MI    Review of Systems  DATA OBTAINED: from patient, nurse GENERAL:  no fevers, fatigue, appetite changes SKIN: No itching, rash or wounds EYES: No eye pain, redness, discharge EARS: No earache, tinnitus, change in hearing NOSE: No congestion, drainage or bleeding  MOUTH/THROAT: No mouth or tooth pain, No sore throat RESPIRATORY: No cough, wheezing, SOB CARDIAC: No chest pain, palpitations, lower extremity edema  GI: No abdominal pain, No N/V/D or constipation, No heartburn or reflux  GU: No dysuria, frequency or  urgency, or incontinence  MUSCULOSKELETAL: No unrelieved bone/joint pain NEUROLOGIC: No headache, dizziness or focal weakness PSYCHIATRIC: No c/o anxiety or sadness   Filed Vitals:   10/26/15 2206  BP: 97/59  Pulse: 83  Temp: 98.8 F (37.1 C)  Resp: 18    SpO2 Readings from Last 1 Encounters:  10/25/15 99%        Physical Exam  GENERAL APPEARANCE: Alert, conversant,  No acute distress.  SKIN: No diaphoresis rash;midline incision is haling well HEAD: Normocephalic,  atraumatic  EYES: Conjunctiva/lids clear. Pupils round, reactive. EOMs intact.  EARS: External exam WNL, canals clear. Hearing grossly normal.  NOSE: No deformity or discharge.  MOUTH/THROAT: Lips w/o lesions  RESPIRATORY: Breathing is even, unlabored. Lung sounds are clear   CARDIOVASCULAR: Heart RRR no murmurs, rubs or gallops. No peripheral edema.   GASTROINTESTINAL: Abdomen is soft, non-tender, not distended w/ normal bowel sounds. GENITOURINARY: Bladder non tender, not distended  MUSCULOSKELETAL: No abnormal joints or musculature NEUROLOGIC:  Cranial nerves 2-12 grossly intact. Moves all extremities  PSYCHIATRIC: Mood and affect appropriate to situation, no behavioral issues  Patient Active Problem List   Diagnosis Date Noted  . Postoperative anemia due to acute blood loss 10/30/2015  . CAD (coronary artery disease)   . S/P CABG (coronary artery bypass graft)   . S/P CABG x 4   . Tricuspid valve regurgitation 10/13/2015  . ACS (acute coronary syndrome) (HCC)   . Acute congestive heart failure (HCC)   . Atrial fibrillation with RVR (HCC) 10/10/2015  . Demand ischemia (HCC) 10/09/2015  . Non-STEMI (non-ST elevated myocardial infarction) (HCC) 10/08/2015  . Acute diastolic (congestive) heart failure (HCC) 10/08/2015  . Stage III chronic kidney disease 05/06/2015  . Hiatal hernia 05/06/2015  . Cervical spondylosis with radiculopathy 12/04/2012  . Hyperlipidemia 11/21/2007  . Obstructive sleep apnea  11/21/2007  . Hypertensive heart disease with CHF (congestive heart failure) (HCC)     CBC    Component Value Date/Time   WBC 11.0* 10/24/2015 0440   RBC 3.23* 10/24/2015 0440   RBC 3.29* 01/16/2012 2214   HGB 8.7* 10/24/2015 0440   HCT 27.8* 10/24/2015 0440   PLT 735* 10/24/2015 0440   MCV 86.1 10/24/2015 0440   LYMPHSABS 0.9 08/23/2013 1054   MONOABS 1.0 08/23/2013 1054   EOSABS 0.0 08/23/2013 1054   BASOSABS 0.0 08/23/2013 1054    CMP     Component Value Date/Time   NA 134* 10/24/2015 0440   K 3.9 10/24/2015 0440   CL 99* 10/24/2015 0440   CO2 26 10/24/2015 0440   GLUCOSE 102* 10/24/2015 0440   BUN 23* 10/24/2015 0440   CREATININE 2.27* 10/24/2015 0440   CREATININE 1.96* 11/28/2012 1233   CALCIUM 8.5* 10/24/2015 0440   PROT 6.6 10/09/2015 0519   ALBUMIN 3.1* 10/13/2015 0545   AST 41 10/09/2015 0519   ALT 48 10/09/2015 0519   ALKPHOS 123 10/09/2015 0519   BILITOT 1.0 10/09/2015 0519   GFRNONAA 27* 10/24/2015 0440   GFRAA 32* 10/24/2015 0440    Lab Results  Component Value Date   HGBA1C 5.6 10/12/2015     Dg Chest 2 View  10/08/2015  CLINICAL DATA:  Shortness of breath and chest pain EXAM: CHEST  2 VIEW COMPARISON:  08/30/2015 FINDINGS: Diffuse interstitial coarsening with fairly symmetric perihilar airspace opacity. There are Kerley lines best visualized in the lateral projection, where small pleural effusions are also seen bilaterally. Normal heart size and stable aortic and hilar contours. IMPRESSION: Pulmonary edema and small pleural effusions. Electronically Signed   By: Marnee Spring M.D.   On: 10/08/2015 22:56    Not all labs, radiology exams or other studies done during hospitalization come through on my EPIC note; however they are reviewed by me.    Assessment and Plan  Non-STEMI (non-ST elevated myocardial infarction) (HCC) Pt presented with pulm edema and AF with RVR; once stable cardiac cath revealed 3 vessel dx and valve problems anf=d pt  underwent CORONARY ARTERY BYPASS GRAFTING (CABG) x four  MITRAL VALVE (MV) REPLACEMENT (N/A) and TRICUSPID VALVE REPAIR (N/A) SNF - admitted for continued OT/Pt  Tricuspid valve regurgitation TV valve repaired surgically  Acute diastolic (congestive) heart failure (HCC) Tx with IV , then po lasix; SNF - will cont lasix;pt not on bblocker, rate too slow, not on ACE 2/2 elevated Cr  Atrial fibrillation with RVR (HCC) SNF - will cont amiodarone 200 mg daily and coumadin, dose expected to be 2.5 mg daily  S/P CABG (coronary artery bypass graft) See above under NSTEMI  Postoperative anemia due to acute blood loss D/c Hb 8.7  SNF - will cont folate and iron and monitor with CBC  Hyperlipidemia Pt was switched from zocor 40 mg to lipitor 10 mg since he was placed on amiodarone; SNF - will cont lipitor 10 mg  Stage III chronic kidney disease SMF - d/c Cr 2.2; will monitor with BMP  Hypertensive heart disease with CHF (congestive heart failure) (HCC) SNF = BP is controlled on lasix 40 mg daily   Time spent > 45 min;> 50% of time with patient was spent reviewing records, labs, tests and studies, counseling and developing plan of care  Margit HanksALEXANDER, Lovenia Debruler D, MD

## 2015-10-30 ENCOUNTER — Encounter: Payer: Self-pay | Admitting: Internal Medicine

## 2015-10-30 DIAGNOSIS — D62 Acute posthemorrhagic anemia: Secondary | ICD-10-CM | POA: Insufficient documentation

## 2015-10-30 NOTE — Assessment & Plan Note (Signed)
SNF - will cont amiodarone 200 mg daily and coumadin, dose expected to be 2.5 mg daily

## 2015-10-30 NOTE — Assessment & Plan Note (Signed)
SMF - d/c Cr 2.2; will monitor with BMP

## 2015-10-30 NOTE — Assessment & Plan Note (Signed)
Pt was switched from zocor 40 mg to lipitor 10 mg since he was placed on amiodarone; SNF - will cont lipitor 10 mg

## 2015-10-30 NOTE — Assessment & Plan Note (Signed)
Tx with IV , then po lasix; SNF - will cont lasix;pt not on bblocker, rate too slow, not on ACE 2/2 elevated Cr

## 2015-10-30 NOTE — Assessment & Plan Note (Addendum)
Pt presented with pulm edema and AF with RVR; once stable cardiac cath revealed 3 vessel dx and valve problems anf=d pt underwent CORONARY ARTERY BYPASS GRAFTING (CABG) x four MITRAL VALVE (MV) REPLACEMENT (N/A) and TRICUSPID VALVE REPAIR (N/A) SNF - admitted for continued OT/Pt

## 2015-10-30 NOTE — Assessment & Plan Note (Signed)
SNF = BP is controlled on lasix 40 mg daily

## 2015-10-30 NOTE — Assessment & Plan Note (Signed)
TV valve repaired surgically

## 2015-10-30 NOTE — Assessment & Plan Note (Addendum)
D/c Hb 8.7  SNF - will cont folate and iron and monitor with CBC

## 2015-10-30 NOTE — Assessment & Plan Note (Signed)
See above under NSTEMI

## 2015-10-31 ENCOUNTER — Non-Acute Institutional Stay (SKILLED_NURSING_FACILITY): Payer: PPO | Admitting: Adult Health

## 2015-10-31 ENCOUNTER — Encounter: Payer: Self-pay | Admitting: Adult Health

## 2015-10-31 DIAGNOSIS — I110 Hypertensive heart disease with heart failure: Secondary | ICD-10-CM | POA: Diagnosis not present

## 2015-10-31 DIAGNOSIS — Z951 Presence of aortocoronary bypass graft: Secondary | ICD-10-CM | POA: Diagnosis not present

## 2015-10-31 DIAGNOSIS — I4891 Unspecified atrial fibrillation: Secondary | ICD-10-CM | POA: Diagnosis not present

## 2015-10-31 DIAGNOSIS — I5031 Acute diastolic (congestive) heart failure: Secondary | ICD-10-CM

## 2015-10-31 DIAGNOSIS — I214 Non-ST elevation (NSTEMI) myocardial infarction: Secondary | ICD-10-CM

## 2015-10-31 MED ORDER — OXYCODONE HCL 5 MG PO TABS: 5.0000 mg | ORAL_TABLET | ORAL | Status: DC | PRN

## 2015-10-31 NOTE — Progress Notes (Signed)
Patient ID: Jordan [name], male   DOB: [birth year], 70 y.o.   MRN: [id number]    Facility:  [facility]      No Known Allergies  Chief Complaint  Patient presents with  . Discharge Note    HPI:  He is being discharged to home with home health for nursing care. He will not need dme. He will his prescriptions to be written and will need to follow up with his pcp.  He had been hospitalized for acute CHF; CABG and valve replacements. He was admitted to this facility for short term rehab.    Past Medical History  Diagnosis Date  . Hypertension   . Kidney stone   . Bipolar 1 disorder (HCC)   . Diverticulitis   . Anxiety   . Arthritis   . Sleep apnea   . Chronic headaches   . Stage III chronic kidney disease     Baseline creatinine 1.8  . Left inguinal hernia   . Atherosclerosis   . CAD (coronary artery disease)     a. 10/11/2015: total occlusion of OM1 and LCx, 50% stenosis in distal LM, 80% stenosis in LAD, 99% stenosis in RCA --> CABG recommended.  . Hiatal hernia   . 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 Antritis (stomach)     Past Surgical History  Procedure Laterality Date  . Appendectomy      70 years old  . Partial colectomy    . Spine surgery  2013  . Anterior cervical decomp/discectomy fusion N/A 12/04/2012    Procedure: ANTERIOR CERVICAL DECOMPRESSION/DISCECTOMY FUSION 2 LEVELS;  Surgeon: Temple Pacini, MD;  Location: [facility];  Service: Neurosurgery;  Laterality: N/A;  Cervical five-six, six-seven anterior cervical decompression/discectomy fusion with allograft and plating  . Esophagogastroduodenoscopy N/A 05/06/2015    Procedure: ESOPHAGOGASTRODUODENOSCOPY (EGD);  Surgeon: Vida Rigger, MD;  Location: [facility];  Service: Endoscopy;  Laterality: N/A;  . Cardiac catheterization N/A 10/11/2015    Procedure: Right/Left Heart Cath and Coronary Angiography;  Surgeon: Lyn Records, MD;  Location: [facility] INVASIVE CV LAB;  Service: Cardiovascular;  Laterality: N/A;    . Coronary artery bypass graft N/A 10/13/2015    Procedure: CORONARY ARTERY BYPASS GRAFTING (CABG) x four using left internal mammary artery and left leg greater saphenous vein harvested endoscopically;  Surgeon: [name], MD;  Location: [facility] OR;  Service: Open Heart Surgery;  Laterality: N/A;  . Tee without cardioversion N/A 10/13/2015    Procedure: TRANSESOPHAGEAL ECHOCARDIOGRAM (TEE);  Surgeon: [name], MD;  Location[facility] OR;  Service: Open Heart Surgery;  Laterality: N/A;  . Mitral valve replacement N/A 10/13/2015    Procedure: MITRAL VALVE (MV) REPLACEMENT;  Surgeon: [name], MD;  Location: [facility] OR;  Service: Open Heart Surgery;  Laterality: N/A;  . Tricuspid valve replacement N/A 10/13/2015    Procedure: TRICUSPID VALVE REPAIR;  Surgeon: [name], MD;  Location: [facility] OR;  Service: Open Heart Surgery;  Laterality: N/A;    VITAL SIGNS BP 100/60 mmHg  Pulse 70  Ht 5\' 6"  (1.676 m)  Wt 212 lb (96.163 kg)  BMI 34.23 kg/m2  SpO2 98%  Patient's Medications  New Prescriptions   No medications on file  Previous Medications   AMIODARONE (PACERONE) 200 MG TABLET    Take 1 tablet (200 mg total) by mouth daily.   ASPIRIN EC 81 MG EC TABLET    Take 1 tablet (81 mg total) by mouth daily.   ATORVASTATIN (LIPITOR)  10 MG TABLET    Take 1 tablet (10 mg total) by mouth daily.   FERROUS SULFATE 325 (65 FE) MG TABLET    Take 1 tablet (325 mg total) by mouth daily with breakfast. For one month then stop.   FOLIC ACID (FOLVITE) 1 MG TABLET    Take 1 tablet (1 mg total) by mouth daily. For one month then stop.   FUROSEMIDE (LASIX) 40 MG TABLET    Take 1 tablet (40 mg total) by mouth daily.   GUAIFENESIN (MUCINEX) 600 MG 12 HR TABLET    Take 1 tablet (600 mg total) by mouth 2 (two) times daily. May stop after discharge from SNF.   LANSOPRAZOLE (PREVACID) 15 MG CAPSULE    Take 1 capsule (15 mg total) by mouth daily at 12 noon.   OXYCODONE (OXY IR/ROXICODONE) 5 MG IMMEDIATE RELEASE TABLET     Take 1-2 tablets (5-10 mg total) by mouth every 4 (four) hours as needed for severe pain.   WARFARIN (COUMADIN) 2.5 MG TABLET    Take 1 tablet (2.5 mg total) by mouth daily. Or as directed.  Modified Medications   No medications on file  Discontinued Medications   No medications on file     SIGNIFICANT DIAGNOSTIC EXAMS  LABS REVIEWED:   10-24-15; wbc 11.0; hgb 8.7; hct 27.8; mcv 86.1; plt 735; glucose 102; bun 23; creat 2.27; k+ 3.9; na++134  Review of Systems  Constitutional: Negative for malaise/fatigue.  Respiratory: Negative for cough and shortness of breath.   Cardiovascular: Negative for chest pain, palpitations and leg swelling.  Gastrointestinal: Negative for heartburn, abdominal pain and constipation.  Musculoskeletal: Negative for myalgias and joint pain.  Skin: Negative.   Neurological: Negative for dizziness and headaches.  Psychiatric/Behavioral: The patient is not nervous/anxious.      Physical Exam  Constitutional: No distress.  Overweight   Eyes: Conjunctivae are normal.  Neck: Neck supple. No JVD present. No thyromegaly present.  Cardiovascular: Normal rate, regular rhythm and intact distal pulses.   Respiratory: Effort normal and breath sounds normal. No respiratory distress. He has no wheezes.  GI: Soft. Bowel sounds are normal. He exhibits no distension. There is no tenderness.  Musculoskeletal: He exhibits no edema.  Able to move all extremities   Lymphadenopathy:    He has no cervical adenopathy.  Neurological: He is alert.  Skin: Skin is warm and dry. He is not diaphoretic.  Psychiatric: He has a normal mood and affect.      ASSESSMENT/ PLAN:  Will discharge him to home with health for rn to evaluate and treat as indicated for heart health and inr management. He will not require dme. His prescriptions have been written for a 30 day supply of his medications with #30 oxycodone 5 mg tabs    Time spent with patient 40   minutes >50% time spent  counseling; reviewing medical record; tests; labs; and developing future plan of care    Synthia InnocentDeborah Malky Rudzinski NP [facility]  Contact [phone] Monday through Friday 8am- 5pm  After hours call [phone]

## 2015-11-05 ENCOUNTER — Inpatient Hospital Stay (HOSPITAL_COMMUNITY)
Admission: EM | Admit: 2015-11-05 | Discharge: 2015-11-17 | DRG: 023 | Disposition: A | Discharge status: Skilled Nursing Facility | Payer: PPO | Attending: Neurology | Admitting: Neurology

## 2015-11-05 ENCOUNTER — Inpatient Hospital Stay (HOSPITAL_COMMUNITY): Payer: PPO

## 2015-11-05 ENCOUNTER — Emergency Department (HOSPITAL_COMMUNITY): Payer: PPO

## 2015-11-05 ENCOUNTER — Encounter (HOSPITAL_COMMUNITY): Payer: Self-pay | Admitting: *Deleted

## 2015-11-05 DIAGNOSIS — J690 Pneumonitis due to inhalation of food and vomit: Secondary | ICD-10-CM | POA: Diagnosis present

## 2015-11-05 DIAGNOSIS — R401 Stupor: Secondary | ICD-10-CM | POA: Diagnosis not present

## 2015-11-05 DIAGNOSIS — I4891 Unspecified atrial fibrillation: Secondary | ICD-10-CM | POA: Diagnosis present

## 2015-11-05 DIAGNOSIS — G9340 Encephalopathy, unspecified: Secondary | ICD-10-CM | POA: Diagnosis not present

## 2015-11-05 DIAGNOSIS — T82538A Leakage of other cardiac and vascular devices and implants, initial encounter: Secondary | ICD-10-CM

## 2015-11-05 DIAGNOSIS — T45511A Poisoning by anticoagulants, accidental (unintentional), initial encounter: Secondary | ICD-10-CM

## 2015-11-05 DIAGNOSIS — Z7901 Long term (current) use of anticoagulants: Secondary | ICD-10-CM

## 2015-11-05 DIAGNOSIS — R4701 Aphasia: Secondary | ICD-10-CM | POA: Diagnosis present

## 2015-11-05 DIAGNOSIS — R739 Hyperglycemia, unspecified: Secondary | ICD-10-CM | POA: Diagnosis present

## 2015-11-05 DIAGNOSIS — I610 Nontraumatic intracerebral hemorrhage in hemisphere, subcortical: Secondary | ICD-10-CM

## 2015-11-05 DIAGNOSIS — Z6831 Body mass index (BMI) 31.0-31.9, adult: Secondary | ICD-10-CM | POA: Diagnosis not present

## 2015-11-05 DIAGNOSIS — J9690 Respiratory failure, unspecified, unspecified whether with hypoxia or hypercapnia: Secondary | ICD-10-CM | POA: Diagnosis present

## 2015-11-05 DIAGNOSIS — G936 Cerebral edema: Secondary | ICD-10-CM | POA: Diagnosis present

## 2015-11-05 DIAGNOSIS — I639 Cerebral infarction, unspecified: Secondary | ICD-10-CM | POA: Diagnosis present

## 2015-11-05 DIAGNOSIS — E785 Hyperlipidemia, unspecified: Secondary | ICD-10-CM | POA: Diagnosis present

## 2015-11-05 DIAGNOSIS — R4189 Other symptoms and signs involving cognitive functions and awareness: Secondary | ICD-10-CM

## 2015-11-05 DIAGNOSIS — J988 Other specified respiratory disorders: Secondary | ICD-10-CM

## 2015-11-05 DIAGNOSIS — I2510 Atherosclerotic heart disease of native coronary artery without angina pectoris: Secondary | ICD-10-CM | POA: Diagnosis present

## 2015-11-05 DIAGNOSIS — M6281 Muscle weakness (generalized): Secondary | ICD-10-CM | POA: Diagnosis present

## 2015-11-05 DIAGNOSIS — D689 Coagulation defect, unspecified: Secondary | ICD-10-CM | POA: Diagnosis present

## 2015-11-05 DIAGNOSIS — N390 Urinary tract infection, site not specified: Secondary | ICD-10-CM | POA: Diagnosis not present

## 2015-11-05 DIAGNOSIS — E876 Hypokalemia: Secondary | ICD-10-CM | POA: Diagnosis not present

## 2015-11-05 DIAGNOSIS — Z981 Arthrodesis status: Secondary | ICD-10-CM

## 2015-11-05 DIAGNOSIS — Z4659 Encounter for fitting and adjustment of other gastrointestinal appliance and device: Secondary | ICD-10-CM

## 2015-11-05 DIAGNOSIS — I129 Hypertensive chronic kidney disease with stage 1 through stage 4 chronic kidney disease, or unspecified chronic kidney disease: Secondary | ICD-10-CM | POA: Diagnosis present

## 2015-11-05 DIAGNOSIS — I10 Essential (primary) hypertension: Secondary | ICD-10-CM | POA: Diagnosis not present

## 2015-11-05 DIAGNOSIS — D6832 Hemorrhagic disorder due to extrinsic circulating anticoagulants: Secondary | ICD-10-CM | POA: Diagnosis present

## 2015-11-05 DIAGNOSIS — Z7982 Long term (current) use of aspirin: Secondary | ICD-10-CM | POA: Diagnosis not present

## 2015-11-05 DIAGNOSIS — F319 Bipolar disorder, unspecified: Secondary | ICD-10-CM | POA: Diagnosis present

## 2015-11-05 DIAGNOSIS — T45515A Adverse effect of anticoagulants, initial encounter: Secondary | ICD-10-CM | POA: Diagnosis present

## 2015-11-05 DIAGNOSIS — E669 Obesity, unspecified: Secondary | ICD-10-CM | POA: Diagnosis present

## 2015-11-05 DIAGNOSIS — N183 Chronic kidney disease, stage 3 (moderate): Secondary | ICD-10-CM | POA: Diagnosis present

## 2015-11-05 DIAGNOSIS — G919 Hydrocephalus, unspecified: Secondary | ICD-10-CM | POA: Diagnosis present

## 2015-11-05 DIAGNOSIS — Z978 Presence of other specified devices: Secondary | ICD-10-CM

## 2015-11-05 DIAGNOSIS — J9600 Acute respiratory failure, unspecified whether with hypoxia or hypercapnia: Secondary | ICD-10-CM | POA: Diagnosis not present

## 2015-11-05 DIAGNOSIS — I619 Nontraumatic intracerebral hemorrhage, unspecified: Secondary | ICD-10-CM | POA: Diagnosis present

## 2015-11-05 DIAGNOSIS — I252 Old myocardial infarction: Secondary | ICD-10-CM

## 2015-11-05 DIAGNOSIS — G935 Compression of brain: Secondary | ICD-10-CM | POA: Diagnosis present

## 2015-11-05 DIAGNOSIS — I611 Nontraumatic intracerebral hemorrhage in hemisphere, cortical: Secondary | ICD-10-CM | POA: Diagnosis present

## 2015-11-05 DIAGNOSIS — Z87891 Personal history of nicotine dependence: Secondary | ICD-10-CM | POA: Diagnosis not present

## 2015-11-05 DIAGNOSIS — W19XXXA Unspecified fall, initial encounter: Secondary | ICD-10-CM

## 2015-11-05 DIAGNOSIS — I615 Nontraumatic intracerebral hemorrhage, intraventricular: Secondary | ICD-10-CM

## 2015-11-05 DIAGNOSIS — E872 Acidosis: Secondary | ICD-10-CM | POA: Diagnosis present

## 2015-11-05 DIAGNOSIS — Z951 Presence of aortocoronary bypass graft: Secondary | ICD-10-CM | POA: Diagnosis not present

## 2015-11-05 DIAGNOSIS — G4733 Obstructive sleep apnea (adult) (pediatric): Secondary | ICD-10-CM | POA: Diagnosis present

## 2015-11-05 DIAGNOSIS — D6489 Other specified anemias: Secondary | ICD-10-CM | POA: Diagnosis present

## 2015-11-05 DIAGNOSIS — Z9289 Personal history of other medical treatment: Secondary | ICD-10-CM

## 2015-11-05 DIAGNOSIS — R9389 Abnormal findings on diagnostic imaging of other specified body structures: Secondary | ICD-10-CM

## 2015-11-05 DIAGNOSIS — R112 Nausea with vomiting, unspecified: Secondary | ICD-10-CM | POA: Diagnosis present

## 2015-11-05 DIAGNOSIS — Z952 Presence of prosthetic heart valve: Secondary | ICD-10-CM

## 2015-11-05 DIAGNOSIS — G911 Obstructive hydrocephalus: Secondary | ICD-10-CM | POA: Diagnosis not present

## 2015-11-05 LAB — CBC WITH DIFFERENTIAL/PLATELET
BASOS ABS: 0 10*3/uL (ref 0.0–0.1)
BASOS PCT: 0 %
Eosinophils Absolute: 0 10*3/uL (ref 0.0–0.7)
Eosinophils Relative: 0 %
HEMATOCRIT: 31.8 % — AB (ref 39.0–52.0)
Hemoglobin: 9.7 g/dL — ABNORMAL LOW (ref 13.0–17.0)
LYMPHS PCT: 7 %
Lymphs Abs: 0.9 10*3/uL (ref 0.7–4.0)
MCH: 26.2 pg (ref 26.0–34.0)
MCHC: 30.5 g/dL (ref 30.0–36.0)
MCV: 85.9 fL (ref 78.0–100.0)
Monocytes Absolute: 0.5 10*3/uL (ref 0.1–1.0)
Monocytes Relative: 4 %
NEUTROS ABS: 11.2 10*3/uL — AB (ref 1.7–7.7)
Neutrophils Relative %: 89 %
PLATELETS: 517 10*3/uL — AB (ref 150–400)
RBC: 3.7 MIL/uL — AB (ref 4.22–5.81)
RDW: 15.3 % (ref 11.5–15.5)
WBC: 12.7 10*3/uL — AB (ref 4.0–10.5)

## 2015-11-05 LAB — BLOOD GAS, ARTERIAL
Acid-base deficit: 1.7 mmol/L (ref 0.0–2.0)
BICARBONATE: 20.4 meq/L (ref 20.0–24.0)
DRAWN BY: 270211
FIO2: 0.5
O2 SAT: 99.4 %
PEEP: 5 cmH2O
PH ART: 7.551 — AB (ref 7.350–7.450)
Patient temperature: 98.6
RATE: 26 resp/min
TCO2: 21.2 mmol/L (ref 0–100)
VT: 570 mL
pCO2 arterial: 23.4 mmHg — ABNORMAL LOW (ref 35.0–45.0)
pO2, Arterial: 134 mmHg — ABNORMAL HIGH (ref 80.0–100.0)

## 2015-11-05 LAB — CBC
HEMATOCRIT: 33.6 % — AB (ref 39.0–52.0)
Hemoglobin: 10.5 g/dL — ABNORMAL LOW (ref 13.0–17.0)
MCH: 27.2 pg (ref 26.0–34.0)
MCHC: 31.3 g/dL (ref 30.0–36.0)
MCV: 87 fL (ref 78.0–100.0)
PLATELETS: 540 10*3/uL — AB (ref 150–400)
RBC: 3.86 MIL/uL — AB (ref 4.22–5.81)
RDW: 15.5 % (ref 11.5–15.5)
WBC: 11.9 10*3/uL — AB (ref 4.0–10.5)

## 2015-11-05 LAB — COMPREHENSIVE METABOLIC PANEL
ALT: 48 U/L (ref 17–63)
AST: 41 U/L (ref 15–41)
Albumin: 3.3 g/dL — ABNORMAL LOW (ref 3.5–5.0)
Alkaline Phosphatase: 211 U/L — ABNORMAL HIGH (ref 38–126)
Anion gap: 16 — ABNORMAL HIGH (ref 5–15)
BUN: 18 mg/dL (ref 6–20)
CHLORIDE: 106 mmol/L (ref 101–111)
CO2: 20 mmol/L — ABNORMAL LOW (ref 22–32)
CREATININE: 2.3 mg/dL — AB (ref 0.61–1.24)
Calcium: 9 mg/dL (ref 8.9–10.3)
GFR, EST AFRICAN AMERICAN: 31 mL/min — AB (ref >60)
GFR, EST NON AFRICAN AMERICAN: 27 mL/min — AB (ref >60)
Glucose, Bld: 194 mg/dL — ABNORMAL HIGH (ref 65–99)
POTASSIUM: 3.6 mmol/L (ref 3.5–5.1)
Sodium: 142 mmol/L (ref 135–145)
Total Bilirubin: 0.4 mg/dL (ref 0.3–1.2)
Total Protein: 6.9 g/dL (ref 6.5–8.1)

## 2015-11-05 LAB — DIFFERENTIAL
Basophils Absolute: 0.1 10*3/uL (ref 0.0–0.1)
Basophils Relative: 1 %
EOS ABS: 0.2 10*3/uL (ref 0.0–0.7)
EOS PCT: 2 %
LYMPHS ABS: 1 10*3/uL (ref 0.7–4.0)
LYMPHS PCT: 9 %
MONOS PCT: 5 %
Monocytes Absolute: 0.5 10*3/uL (ref 0.1–1.0)
NEUTROS PCT: 83 %
Neutro Abs: 9.1 10*3/uL — ABNORMAL HIGH (ref 1.7–7.7)

## 2015-11-05 LAB — PROTIME-INR
INR: 2.81 — ABNORMAL HIGH (ref 0.00–1.49)
INR: 1.32 (ref 0.00–1.49)
INR: 1.31 (ref 0.00–1.49)
PROTHROMBIN TIME: 29.1 s — AB (ref 11.6–15.2)
PROTHROMBIN TIME: 16.5 s — AB (ref 11.6–15.2)
Prothrombin Time: 16.5 seconds — ABNORMAL HIGH (ref 11.6–15.2)

## 2015-11-05 LAB — I-STAT CG4 LACTIC ACID, ED
Lactic Acid, Venous: 6.15 mmol/L (ref 0.5–2.0)
Lactic Acid, Venous: 4.86 mmol/L (ref 0.5–2.0)

## 2015-11-05 LAB — I-STAT ARTERIAL BLOOD GAS, ED
ACID-BASE DEFICIT: 5 mmol/L — AB (ref 0.0–2.0)
Bicarbonate: 17.1 mEq/L — ABNORMAL LOW (ref 20.0–24.0)
O2 Saturation: 98 %
PH ART: 7.477 — AB (ref 7.350–7.450)
PO2 ART: 88 mmHg (ref 80.0–100.0)
TCO2: 18 mmol/L (ref 0–100)
pCO2 arterial: 23.2 mmHg — ABNORMAL LOW (ref 35.0–45.0)

## 2015-11-05 LAB — I-STAT CHEM 8, ED
BUN: 23 mg/dL — AB (ref 6–20)
CALCIUM ION: 1.13 mmol/L (ref 1.13–1.30)
CREATININE: 2.1 mg/dL — AB (ref 0.61–1.24)
Chloride: 104 mmol/L (ref 101–111)
GLUCOSE: 188 mg/dL — AB (ref 65–99)
HCT: 37 % — ABNORMAL LOW (ref 39.0–52.0)
HEMOGLOBIN: 12.6 g/dL — AB (ref 13.0–17.0)
Potassium: 3.5 mmol/L (ref 3.5–5.1)
Sodium: 142 mmol/L (ref 135–145)
TCO2: 23 mmol/L (ref 0–100)

## 2015-11-05 LAB — URINE MICROSCOPIC-ADD ON
BACTERIA UA: NONE SEEN
Squamous Epithelial / LPF: NONE SEEN

## 2015-11-05 LAB — RAPID URINE DRUG SCREEN, HOSP PERFORMED
Amphetamines: NOT DETECTED
BENZODIAZEPINES: NOT DETECTED
Barbiturates: NOT DETECTED
COCAINE: NOT DETECTED
OPIATES: NOT DETECTED
TETRAHYDROCANNABINOL: NOT DETECTED

## 2015-11-05 LAB — I-STAT TROPONIN, ED: Troponin i, poc: 0.03 ng/mL (ref 0.00–0.08)

## 2015-11-05 LAB — URINALYSIS, ROUTINE W REFLEX MICROSCOPIC
Bilirubin Urine: NEGATIVE
GLUCOSE, UA: NEGATIVE mg/dL
Ketones, ur: NEGATIVE mg/dL
LEUKOCYTES UA: NEGATIVE
Nitrite: NEGATIVE
PROTEIN: NEGATIVE mg/dL
Specific Gravity, Urine: 1.019 (ref 1.005–1.030)
pH: 5.5 (ref 5.0–8.0)

## 2015-11-05 LAB — CBG MONITORING, ED: Glucose-Capillary: 142 mg/dL — ABNORMAL HIGH (ref 65–99)

## 2015-11-05 LAB — LIPASE, BLOOD: LIPASE: 149 U/L — AB (ref 11–51)

## 2015-11-05 LAB — TRIGLYCERIDES: Triglycerides: 136 mg/dL (ref <150)

## 2015-11-05 LAB — BRAIN NATRIURETIC PEPTIDE: B Natriuretic Peptide: 580.1 pg/mL — ABNORMAL HIGH (ref 0.0–100.0)

## 2015-11-05 LAB — CG4 I-STAT (LACTIC ACID): LACTIC ACID, VENOUS: 4.92 mmol/L — AB (ref 0.5–2.0)

## 2015-11-05 LAB — MRSA PCR SCREENING: MRSA BY PCR: NEGATIVE

## 2015-11-05 MED ORDER — SENNOSIDES 8.8 MG/5ML PO SYRP
5.0000 mL | ORAL_SOLUTION | Freq: Two times a day (BID) | ORAL | Status: DC | PRN
  Filled 2015-11-05: qty 5

## 2015-11-05 MED ORDER — PROPOFOL 1000 MG/100ML IV EMUL
0.0000 ug/kg/min | INTRAVENOUS | Status: DC
  Administered 2015-11-05 – 2015-11-06 (×2): 10 ug/kg/min via INTRAVENOUS
  Administered 2015-11-06 – 2015-11-07 (×2): 20 ug/kg/min via INTRAVENOUS
  Filled 2015-11-05 (×5): qty 100

## 2015-11-05 MED ORDER — ANTISEPTIC ORAL RINSE SOLUTION (CORINZ)
7.0000 mL | OROMUCOSAL | Status: DC
  Administered 2015-11-05 – 2015-11-07 (×21): 7 mL via OROMUCOSAL

## 2015-11-05 MED ORDER — AMIODARONE HCL 200 MG PO TABS
200.0000 mg | ORAL_TABLET | Freq: Every day | ORAL | Status: DC
  Administered 2015-11-06 – 2015-11-17 (×12): 200 mg
  Filled 2015-11-05 (×12): qty 1

## 2015-11-05 MED ORDER — LABETALOL HCL 5 MG/ML IV SOLN
10.0000 mg | INTRAVENOUS | Status: DC | PRN
  Administered 2015-11-08 – 2015-11-13 (×11): 20 mg via INTRAVENOUS
  Administered 2015-11-14 – 2015-11-15 (×3): 10 mg via INTRAVENOUS
  Administered 2015-11-15 – 2015-11-16 (×2): 20 mg via INTRAVENOUS
  Filled 2015-11-05 (×17): qty 4

## 2015-11-05 MED ORDER — SODIUM CHLORIDE 0.9 % IV BOLUS (SEPSIS)
500.0000 mL | Freq: Once | INTRAVENOUS | Status: AC
  Administered 2015-11-05: 500 mL via INTRAVENOUS

## 2015-11-05 MED ORDER — SODIUM CHLORIDE 0.9 % IV BOLUS (SEPSIS)
1000.0000 mL | Freq: Once | INTRAVENOUS | Status: AC
  Administered 2015-11-05: 1000 mL via INTRAVENOUS

## 2015-11-05 MED ORDER — VANCOMYCIN HCL IN DEXTROSE 1-5 GM/200ML-% IV SOLN
1000.0000 mg | Freq: Once | INTRAVENOUS | Status: DC
  Filled 2015-11-05: qty 200

## 2015-11-05 MED ORDER — NALOXONE HCL 2 MG/2ML IJ SOSY
2.0000 mg | PREFILLED_SYRINGE | Freq: Once | INTRAMUSCULAR | Status: AC
  Administered 2015-11-05: 2 mg via INTRAVENOUS
  Filled 2015-11-05: qty 2

## 2015-11-05 MED ORDER — PROPOFOL 1000 MG/100ML IV EMUL
INTRAVENOUS | Status: AC
  Administered 2015-11-05: 1000 mg
  Filled 2015-11-05: qty 100

## 2015-11-05 MED ORDER — ETOMIDATE 2 MG/ML IV SOLN
30.0000 mg | Freq: Once | INTRAVENOUS | Status: AC
  Administered 2015-11-05: 30 mg via INTRAVENOUS

## 2015-11-05 MED ORDER — ACETAMINOPHEN 325 MG PO TABS
650.0000 mg | ORAL_TABLET | ORAL | Status: DC | PRN
  Administered 2015-11-08: 650 mg via ORAL
  Filled 2015-11-05 (×2): qty 2

## 2015-11-05 MED ORDER — SENNOSIDES-DOCUSATE SODIUM 8.6-50 MG PO TABS
1.0000 | ORAL_TABLET | Freq: Two times a day (BID) | ORAL | Status: DC
  Administered 2015-11-05 – 2015-11-17 (×17): 1 via ORAL
  Filled 2015-11-05 (×17): qty 1

## 2015-11-05 MED ORDER — MIDAZOLAM HCL 2 MG/2ML IJ SOLN: 1.0000 mg | INTRAMUSCULAR | Status: DC | PRN

## 2015-11-05 MED ORDER — ACETAMINOPHEN 650 MG RE SUPP
650.0000 mg | RECTAL | Status: DC | PRN
  Administered 2015-11-05: 650 mg via RECTAL
  Filled 2015-11-05: qty 1

## 2015-11-05 MED ORDER — SODIUM CHLORIDE 0.9 % IV SOLN
INTRAVENOUS | Status: DC
  Administered 2015-11-06 – 2015-11-08 (×2): via INTRAVENOUS

## 2015-11-05 MED ORDER — VANCOMYCIN HCL IN DEXTROSE 1-5 GM/200ML-% IV SOLN
1000.0000 mg | Freq: Once | INTRAVENOUS | Status: AC
  Administered 2015-11-05: 1000 mg via INTRAVENOUS
  Filled 2015-11-05: qty 200

## 2015-11-05 MED ORDER — FENTANYL CITRATE (PF) 100 MCG/2ML IJ SOLN: 50.0000 ug | INTRAMUSCULAR | Status: DC | PRN

## 2015-11-05 MED ORDER — HYDROCORTISONE NA SUCCINATE PF 100 MG IJ SOLR: 150.0000 mg | Freq: Once | INTRAMUSCULAR | Status: DC

## 2015-11-05 MED ORDER — PANTOPRAZOLE SODIUM 40 MG IV SOLR
40.0000 mg | Freq: Once | INTRAVENOUS | Status: AC
  Administered 2015-11-05: 40 mg via INTRAVENOUS
  Filled 2015-11-05: qty 40

## 2015-11-05 MED ORDER — PIPERACILLIN-TAZOBACTAM 3.375 G IVPB
3.3750 g | Freq: Once | INTRAVENOUS | Status: AC
  Administered 2015-11-05: 3.375 g via INTRAVENOUS
  Filled 2015-11-05: qty 50

## 2015-11-05 MED ORDER — STROKE: EARLY STAGES OF RECOVERY BOOK
Freq: Once | Status: AC
  Administered 2015-11-05: 11:00:00
  Filled 2015-11-05: qty 1

## 2015-11-05 MED ORDER — BISACODYL 10 MG RE SUPP: 10.0000 mg | Freq: Every day | RECTAL | Status: DC | PRN

## 2015-11-05 MED ORDER — PIPERACILLIN-TAZOBACTAM 3.375 G IVPB
3.3750 g | Freq: Three times a day (TID) | INTRAVENOUS | Status: DC
  Filled 2015-11-05 (×2): qty 50

## 2015-11-05 MED ORDER — VANCOMYCIN HCL 10 G IV SOLR: 1250.0000 mg | INTRAVENOUS | Status: DC

## 2015-11-05 MED ORDER — PROTHROMBIN COMPLEX CONC HUMAN 500 UNITS IV KIT
2136.0000 [IU] | PACK | Status: AC
  Administered 2015-11-05: 2136 [IU] via INTRAVENOUS
  Filled 2015-11-05: qty 85

## 2015-11-05 MED ORDER — LIDOCAINE HCL (PF) 1 % IJ SOLN
INTRAMUSCULAR | Status: AC
Start: 2015-11-05 — End: 2015-11-05
  Administered 2015-11-05: 5 mL
  Filled 2015-11-05: qty 5

## 2015-11-05 MED ORDER — AMIODARONE HCL 200 MG PO TABS: 200.0000 mg | ORAL_TABLET | Freq: Every day | ORAL | Status: DC

## 2015-11-05 MED ORDER — SUCCINYLCHOLINE CHLORIDE 20 MG/ML IJ SOLN
150.0000 mg | Freq: Once | INTRAMUSCULAR | Status: AC
  Administered 2015-11-05: 150 mg via INTRAVENOUS

## 2015-11-05 MED ORDER — PROTHROMBIN COMPLEX CONC HUMAN 500 UNITS IV KIT
25.0000 [IU]/kg | PACK | Status: DC
  Filled 2015-11-05: qty 96

## 2015-11-05 MED ORDER — VITAMIN K1 10 MG/ML IJ SOLN
10.0000 mg | INTRAVENOUS | Status: AC
  Administered 2015-11-05: 10 mg via INTRAVENOUS
  Filled 2015-11-05: qty 1

## 2015-11-05 MED ORDER — CHLORHEXIDINE GLUCONATE 0.12% ORAL RINSE (MEDLINE KIT)
15.0000 mL | Freq: Two times a day (BID) | OROMUCOSAL | Status: DC
  Administered 2015-11-05 – 2015-11-07 (×5): 15 mL via OROMUCOSAL

## 2015-11-05 MED ORDER — PANTOPRAZOLE SODIUM 40 MG IV SOLR
40.0000 mg | Freq: Every day | INTRAVENOUS | Status: DC
  Administered 2015-11-05 – 2015-11-08 (×4): 40 mg via INTRAVENOUS
  Filled 2015-11-05 (×4): qty 40

## 2015-11-05 NOTE — Consult Note (Signed)
Reason for Consult:ICH Referring Physician: [name] is an 70 y.o. male.  HPI: brought to the er after he was found sleepy by wife after he complained of back pain. In the er it was found to have a left intracerebral hematoma. Was f/c. Then got worse ,was intubated and a repeat ct showed worsening of the ich. We were called for insertion of an IVC  Patient on coumadin after a coronary bypass  Past Medical History  Diagnosis Date  . Hypertension   . Kidney stone   . Bipolar 1 disorder ([place])   . Diverticulitis   . Anxiety   . Arthritis   . Sleep apnea   . Chronic headaches   . Stage III chronic kidney disease     Baseline creatinine 1.8  . Left inguinal hernia   . Atherosclerosis   . CAD (coronary artery disease)     a. 10/11/2015: total occlusion of OM1 and LCx, 50% stenosis in distal LM, 80% stenosis in LAD, 99% stenosis in RCA --> CABG recommended.  . Hiatal hernia   . 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 Antritis (stomach)     Past Surgical History  Procedure Laterality Date  . Appendectomy      70 years old  . Partial colectomy    . Spine surgery  2013  . Anterior cervical decomp/discectomy fusion N/A 12/04/2012    Procedure: ANTERIOR CERVICAL DECOMPRESSION/DISCECTOMY FUSION 2 LEVELS;  Surgeon: Charlie Pitter, MD;  Location: [facility];  Service: Neurosurgery;  Laterality: N/A;  Cervical five-six, six-seven anterior cervical decompression/discectomy fusion with allograft and plating  . Esophagogastroduodenoscopy N/A 05/06/2015    Procedure: ESOPHAGOGASTRODUODENOSCOPY (EGD);  Surgeon: Clarene Essex, MD;  Location: [facility];  Service: Endoscopy;  Laterality: N/A;  . Cardiac catheterization N/A 10/11/2015    Procedure: Right/Left Heart Cath and Coronary Angiography;  Surgeon: Belva Crome, MD;  Location: [facility];  Service: Cardiovascular;  Laterality: N/A;  . Coronary artery bypass graft N/A 10/13/2015    Procedure: CORONARY ARTERY BYPASS  GRAFTING (CABG) x four using left internal mammary artery and left leg greater saphenous vein harvested endoscopically;  Surgeon: Gaye Pollack, MD;  Location: [facility];  Service: Open Heart Surgery;  Laterality: N/A;  . Tee without cardioversion N/A 10/13/2015    Procedure: TRANSESOPHAGEAL ECHOCARDIOGRAM (TEE);  Surgeon: Gaye Pollack, MD;  Location: [facility];  Service: Open Heart Surgery;  Laterality: N/A;  . Mitral valve replacement N/A 10/13/2015    Procedure: MITRAL VALVE (MV) REPLACEMENT;  Surgeon: Gaye Pollack, MD;  Location: [facility];  Service: Open Heart Surgery;  Laterality: N/A;  . Tricuspid valve replacement N/A 10/13/2015    Procedure: TRICUSPID VALVE REPAIR;  Surgeon: Gaye Pollack, MD;  Location: [facility];  Service: Open Heart Surgery;  Laterality: N/A;    Family History  Problem Relation Age of Onset  . Anesthesia problems Neg Hx   . Heart attack Father   . Liver cancer Paternal Grandfather     unsure of other cancers   . Diabetes Sister     Social History:  reports that he quit smoking about 32 years ago. He has never used smokeless tobacco. He reports that he does not drink alcohol or use illicit drugs.  Allergies: No Known Allergies  Medications: see HP  Results for orders placed or performed during the hospital encounter of 11/05/15 (from the past 48 hour(s))  Comprehensive metabolic panel     Status: Abnormal  Collection Time: 11/05/15  6:39 AM  Result Value Ref Range   Sodium 142 135 - 145 mmol/L   Potassium 3.6 3.5 - 5.1 mmol/L   Chloride 106 101 - 111 mmol/L   CO2 20 (L) 22 - 32 mmol/L   Glucose, Bld 194 (H) 65 - 99 mg/dL   BUN 18 6 - 20 mg/dL   Creatinine, Ser 2.30 (H) 0.61 - 1.24 mg/dL   Calcium 9.0 8.9 - 10.3 mg/dL   Total Protein 6.9 6.5 - 8.1 g/dL   Albumin 3.3 (L) 3.5 - 5.0 g/dL   AST 41 15 - 41 U/L   ALT 48 17 - 63 U/L   Alkaline Phosphatase 211 (H) 38 - 126 U/L   Total Bilirubin 0.4 0.3 - 1.2 mg/dL   GFR calc non Af Amer 27 (L) >60 mL/min   GFR  calc Af Amer 31 (L) >60 mL/min    Comment: (NOTE) The eGFR has been calculated using the CKD EPI equation. This calculation has not been validated in all clinical situations. eGFR's persistently <60 mL/min signify possible Chronic Kidney Disease.    Anion gap 16 (H) 5 - 15  CBC     Status: Abnormal   Collection Time: 11/05/15  6:39 AM  Result Value Ref Range   WBC 11.9 (H) 4.0 - 10.5 K/uL   RBC 3.86 (L) 4.22 - 5.81 MIL/uL   Hemoglobin 10.5 (L) 13.0 - 17.0 g/dL   HCT 33.6 (L) 39.0 - 52.0 %   MCV 87.0 78.0 - 100.0 fL   MCH 27.2 26.0 - 34.0 pg   MCHC 31.3 30.0 - 36.0 g/dL   RDW 15.5 11.5 - 15.5 %   Platelets 540 (H) 150 - 400 K/uL  Lipase, blood     Status: Abnormal   Collection Time: 11/05/15  6:39 AM  Result Value Ref Range   Lipase 149 (H) 11 - 51 U/L  Protime-INR     Status: Abnormal   Collection Time: 11/05/15  6:39 AM  Result Value Ref Range   Prothrombin Time 29.1 (H) 11.6 - 15.2 seconds   INR 2.81 (H) 0.00 - 1.49  Differential     Status: Abnormal   Collection Time: 11/05/15  6:39 AM  Result Value Ref Range   Neutrophils Relative % 83 %   Neutro Abs 9.1 (H) 1.7 - 7.7 K/uL   Lymphocytes Relative 9 %   Lymphs Abs 1.0 0.7 - 4.0 K/uL   Monocytes Relative 5 %   Monocytes Absolute 0.5 0.1 - 1.0 K/uL   Eosinophils Relative 2 %   Eosinophils Absolute 0.2 0.0 - 0.7 K/uL   Basophils Relative 1 %   Basophils Absolute 0.1 0.0 - 0.1 K/uL  I-stat troponin, ED     Status: None   Collection Time: 11/05/15  7:27 AM  Result Value Ref Range   Troponin i, poc 0.03 0.00 - 0.08 ng/mL   Comment 3            Comment: Due to the release kinetics of cTnI, a negative result within the first hours of the onset of symptoms does not rule out myocardial infarction with certainty. If myocardial infarction is still suspected, repeat the test at appropriate intervals.   I-stat chem 8, ed     Status: Abnormal   Collection Time: 11/05/15  7:29 AM  Result Value Ref Range   Sodium 142 135 -  145 mmol/L   Potassium 3.5 3.5 - 5.1 mmol/L   Chloride 104 101 -  111 mmol/L   BUN 23 (H) 6 - 20 mg/dL   Creatinine, Ser 2.10 (H) 0.61 - 1.24 mg/dL   Glucose, Bld 188 (H) 65 - 99 mg/dL   Calcium, Ion 1.13 1.13 - 1.30 mmol/L   TCO2 23 0 - 100 mmol/L   Hemoglobin 12.6 (L) 13.0 - 17.0 g/dL   HCT 37.0 (L) 39.0 - 52.0 %  CBG monitoring, ED     Status: Abnormal   Collection Time: 11/05/15  7:33 AM  Result Value Ref Range   Glucose-Capillary 142 (H) 65 - 99 mg/dL  Urinalysis, Routine w reflex microscopic (not at [facility])     Status: Abnormal   Collection Time: 11/05/15  7:51 AM  Result Value Ref Range   Color, Urine YELLOW YELLOW   APPearance CLEAR CLEAR   Specific Gravity, Urine 1.019 1.005 - 1.030   pH 5.5 5.0 - 8.0   Glucose, UA NEGATIVE NEGATIVE mg/dL   Hgb urine dipstick LARGE (A) NEGATIVE   Bilirubin Urine NEGATIVE NEGATIVE   Ketones, ur NEGATIVE NEGATIVE mg/dL   Protein, ur NEGATIVE NEGATIVE mg/dL   Nitrite NEGATIVE NEGATIVE   Leukocytes, UA NEGATIVE NEGATIVE  Urine rapid drug screen (hosp performed)     Status: None   Collection Time: 11/05/15  7:51 AM  Result Value Ref Range   Opiates NONE DETECTED NONE DETECTED   Cocaine NONE DETECTED NONE DETECTED   Benzodiazepines NONE DETECTED NONE DETECTED   Amphetamines NONE DETECTED NONE DETECTED   Tetrahydrocannabinol NONE DETECTED NONE DETECTED   Barbiturates NONE DETECTED NONE DETECTED    Comment:        DRUG SCREEN FOR MEDICAL PURPOSES ONLY.  IF CONFIRMATION IS NEEDED FOR ANY PURPOSE, NOTIFY LAB WITHIN 5 DAYS.        LOWEST DETECTABLE LIMITS FOR URINE DRUG SCREEN Drug Class       Cutoff (ng/mL) Amphetamine      1000 Barbiturate      200 Benzodiazepine   782 Tricyclics       423 Opiates          300 Cocaine          300 THC              50   Urine microscopic-add on     Status: None   Collection Time: 11/05/15  7:51 AM  Result Value Ref Range   Squamous Epithelial / LPF NONE SEEN NONE SEEN   WBC, UA 0-5 0 - 5 WBC/hpf    RBC / HPF 6-30 0 - 5 RBC/hpf   Bacteria, UA NONE SEEN NONE SEEN  I-Stat arterial blood gas, ED     Status: Abnormal   Collection Time: 11/05/15  8:09 AM  Result Value Ref Range   pH, Arterial 7.477 (H) 7.350 - 7.450   pCO2 arterial 23.2 (L) 35.0 - 45.0 mmHg   pO2, Arterial 88.0 80.0 - 100.0 mmHg   Bicarbonate 17.1 (L) 20.0 - 24.0 mEq/L   TCO2 18 0 - 100 mmol/L   O2 Saturation 98.0 %   Acid-base deficit 5.0 (H) 0.0 - 2.0 mmol/L   Patient temperature 98.6 F    Collection site RADIAL, [name]'S TEST ACCEPTABLE    Drawn by Operator    Sample type ARTERIAL   I-Stat CG4 Lactic Acid, ED     Status: Abnormal   Collection Time: 11/05/15  8:11 AM  Result Value Ref Range   Lactic Acid, Venous 6.15 (HH) 0.5 - 2.0 mmol/L   Comment NOTIFIED  PHYSICIAN   Brain natriuretic peptide     Status: Abnormal   Collection Time: 11/05/15  8:44 AM  Result Value Ref Range   B Natriuretic Peptide 580.1 (H) 0.0 - 100.0 pg/mL  CBC WITH DIFFERENTIAL     Status: Abnormal   Collection Time: 11/05/15  8:44 AM  Result Value Ref Range   WBC 12.7 (H) 4.0 - 10.5 K/uL   RBC 3.70 (L) 4.22 - 5.81 MIL/uL   Hemoglobin 9.7 (L) 13.0 - 17.0 g/dL    Comment: REPEATED TO VERIFY DELTA CHECK NOTED    HCT 31.8 (L) 39.0 - 52.0 %   MCV 85.9 78.0 - 100.0 fL   MCH 26.2 26.0 - 34.0 pg   MCHC 30.5 30.0 - 36.0 g/dL   RDW 15.3 11.5 - 15.5 %   Platelets 517 (H) 150 - 400 K/uL   Neutrophils Relative % 89 %   Neutro Abs 11.2 (H) 1.7 - 7.7 K/uL   Lymphocytes Relative 7 %   Lymphs Abs 0.9 0.7 - 4.0 K/uL   Monocytes Relative 4 %   Monocytes Absolute 0.5 0.1 - 1.0 K/uL   Eosinophils Relative 0 %   Eosinophils Absolute 0.0 0.0 - 0.7 K/uL   Basophils Relative 0 %   Basophils Absolute 0.0 0.0 - 0.1 K/uL  I-Stat CG4 Lactic Acid, ED  (not at  [facility])     Status: Abnormal   Collection Time: 11/05/15  8:58 AM  Result Value Ref Range   Lactic Acid, Venous 4.86 (HH) 0.5 - 2.0 mmol/L   Comment NOTIFIED PHYSICIAN   Protime-INR     Status:  Abnormal   Collection Time: 11/05/15 10:58 AM  Result Value Ref Range   Prothrombin Time 16.5 (H) 11.6 - 15.2 seconds   INR 1.32 0.00 - 1.49  CG4 I-STAT (Lactic acid)     Status: Abnormal   Collection Time: 11/05/15 11:08 AM  Result Value Ref Range   Lactic Acid, Venous 4.92 (HH) 0.5 - 2.0 mmol/L   Comment NOTIFIED PHYSICIAN   MRSA PCR Screening     Status: None   Collection Time: 11/05/15 11:18 AM  Result Value Ref Range   MRSA by PCR NEGATIVE NEGATIVE    Comment:        The GeneXpert MRSA Assay (FDA approved for NASAL specimens only), is one component of a comprehensive MRSA colonization surveillance program. It is not intended to diagnose MRSA infection nor to guide or monitor treatment for MRSA infections.     Ct Head Wo Contrast  11/05/2015  CLINICAL DATA:  Intracranial hemorrhage EXAM: CT HEAD WITHOUT CONTRAST TECHNIQUE: Contiguous axial images were obtained from the base of the skull through the vertex without intravenous contrast. COMPARISON:  Head CT 11/05/2015 at 827 hours FINDINGS: Large intraventricular hemorrhage predominantly on the LEFT. The high-density hemorrhage within the LEFT lateral ventricle/atria is increased in volume measuring 3.7 x 3.3 cm (image 17, series 3) compared to 2.7 by 2.4 cm. There is increased edema surrounding the LEFT ventricle atria. The volume of blood within the temporal horn is slightly increased measuring 20 mm compares 33m. There is no significant midline shift. There is hemorrhage within the third ventricle which is not changed in volume. Small amount hemorrhage in the bilateral anterior ventricles is not changed. Additionally there is a blood within the fourth ventricle which is unchanged in volume. No parenchymal hemorrhage identified. No extra-axial fluid collections. Patent basilar cisterns. IMPRESSION: 1. Increase in volume of hemorrhage within the LEFT lateral ventricle  anomaly within the LEFT atria and to lesser degree the temporal  horn. 2. Increase in parenchymal edema surrounding the LEFT lateral ventricle hemorrhage. 3. No midline shift.  Basilar cisterns are patent. 4. No change in volume of hemorrhage within the third ventricle and fourth ventricle and anterior ventricles. Findings conveyed toJOSEPH ZAMMIT on 11/05/2015  at10:59. Electronically Signed   By: Suzy Bouchard M.D.   On: 11/05/2015 11:02   Ct Head Wo Contrast  11/05/2015  CLINICAL DATA:  Recent CABG with nausea and vomiting. Unable to follow commands. EXAM: CT HEAD WITHOUT CONTRAST TECHNIQUE: Contiguous axial images were obtained from the base of the skull through the vertex without intravenous contrast. COMPARISON:  07/28/2015 FINDINGS: Motion artifact over the superior images. Examination demonstrates an acute parenchymal hemorrhage abutting the atria of the left lateral ventricle measuring approximately 1.6 x 2.6 cm with mild adjacent edema. Moderate acute hemorrhage filling the left lateral ventricle and extending into the right lateral ventricle, third ventricle and fourth ventricles. Ventricles are within normal in size although minimally expanded compared to the prior exam. There is no evidence of midline shift. There is evidence of chronic ischemic microvascular disease. Small old lacunar infarct over the left lentiform nucleus. Remaining bones and soft tissues are within normal. IMPRESSION: New acute parenchymal hemorrhage abutting the atria of the left lateral ventricle measuring approximate 1.6 x 2.6 cm with mild adjacent edema. Hemorrhage extends into the ventricular system as described. Chronic ischemic microvascular disease. Small old left-sided lacunar infarct. Critical Value/emergent results were called by telephone at the time of interpretation on 11/05/2015 at 8:45 am to Dr. Milton Ferguson , who verbally acknowledged these results. Electronically Signed   By: Marin Olp M.D.   On: 11/05/2015 08:45   Dg Chest Portable 1 View  11/05/2015  CLINICAL DATA:   Intracerebral hemorrhage. Acute respiratory failure. Intubation. EXAM: PORTABLE CHEST 1 VIEW COMPARISON:  11/05/2015 FINDINGS: New endotracheal tube and nasogastric tube are seen in appropriate position. Improved aeration of both lungs is seen with decreased bibasilar atelectasis. No evidence of pulmonary consolidation or pleural effusion. 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 IMPRESSION: Endotracheal tube and nasogastric tube in appropriate position. Improved aeration with decreased bibasilar atelectasis. Electronically Signed   By: Earle Gell M.D.   On: 11/05/2015 11:51   Dg Chest Port 1 View  11/05/2015  CLINICAL DATA:  Nausea and vomiting. EXAM: PORTABLE CHEST 1 VIEW COMPARISON:  10/24/2015 FINDINGS: Sternotomy wires and prosthetic heart valve unchanged. Lungs are hypoinflated with minimal prominence of the perihilar markings suggesting mild vascular congestion. Mild opacification over the medial left base which may represent effusion and atelectasis although cannot exclude infection. Cardiomediastinal silhouette and remainder of the exam is unchanged. IMPRESSION: Hypo inflation with mild opacification of the medial left base which may be due to effusions/atelectasis although cannot exclude infection. Suggestion of minimal vascular congestion. Electronically Signed   By: Marin Olp M.D.   On: 11/05/2015 07:53    Review of Systems  Unable to perform ROS: intubated   Blood pressure 83/62, pulse 98, temperature 97 F (36.1 C), temperature source Temporal, resp. rate 14, height '5\' 9"'  (1.753 m), SpO2 99 %. Physical Exam  Intubated sedated. Pupils 2 mm  No evidencr of traumatic injury   Assessment/Plan: Insertion of  An IVC  ASAP in the right frontal horn  Tema Alire M 11/05/2015, 1:13 PM

## 2015-11-05 NOTE — ED Notes (Signed)
Pt's airway patent. MD checked gag reflex- intact.

## 2015-11-05 NOTE — ED Notes (Signed)
RN asked MD if in and out appropriate for pt versus foley. MD requests foley due to pt being obtunded.

## 2015-11-05 NOTE — ED Notes (Signed)
RN transported to CT  

## 2015-11-05 NOTE — ED Notes (Signed)
MD aware of pt's condition. 

## 2015-11-05 NOTE — Progress Notes (Signed)
   11/05/15 1100  Clinical Encounter Type  Visited With Family  Visit Type Initial;Critical Care;Patient actively dying  Referral From Nurse  Spiritual Encounters  Spiritual Needs Prayer;Emotional;Grief support  CH asked to escort family to 71M waiting area; pt in critical care and CH offered prayer, spiritual and emotional support.  Family in 3rd floor waiting area; [facility]; if further spiritual care needed, page [phone]. 11:16 AM Erline LevineMichael I Rael Tilly

## 2015-11-05 NOTE — Progress Notes (Signed)
Utilization review completed.  

## 2015-11-05 NOTE — Sedation Documentation (Signed)
Intubation complete.

## 2015-11-05 NOTE — ED Notes (Signed)
Neurology at bedside to speak with family

## 2015-11-05 NOTE — H&P (Addendum)
Neurology Consultation Reason for Consult: Stroke Referring Physician: Zammit, J  CC: Altered mental Status  History is obtained from: patient   HPI: Jordan [name] is a 70 y.o. male with a history of htn, recent NSTEMI in December who underwent CABG at that time. He was found to have Afib with RVR and was started on coumadin. He was doing well until last night, when he began to feel ill and have n/v. This AM, he was found to be confused and EMS was called. He was found to have a new peri-ventricular hemorrhage with large amounts of IVH.   He was initially following commands when he first presented, but has subsequently worsened and is no longer doing so.   ICH score: 2(though with IVH, may be > 30cc which would be a 3)  LKW: 1/14  tpa given?: no, hemorrhage  ROS:  Unable to obtain due to altered mental status.   Past Medical History  Diagnosis Date  . Hypertension   . Kidney stone   . Bipolar 1 disorder ([facility])   . Diverticulitis   . Anxiety   . Arthritis   . Sleep apnea   . Chronic headaches   . Stage III chronic kidney disease     Baseline creatinine 1.8  . Left inguinal hernia   . Atherosclerosis   . CAD (coronary artery disease)     a. 10/11/2015: total occlusion of OM1 and LCx, 50% stenosis in distal LM, 80% stenosis in LAD, 99% stenosis in RCA --> CABG recommended.  . Hiatal hernia   . 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 Antritis (stomach)      Family History  Problem Relation Age of Onset  . Anesthesia problems Neg Hx   . Heart attack Father   . Liver cancer Paternal Grandfather     unsure of other cancers   . Diabetes Sister      Social History:  reports that he quit smoking about 32 years ago. He has never used smokeless tobacco. He reports that he does not drink alcohol or use illicit drugs.   Exam: Current vital signs: BP 122/86 mmHg  Pulse 108  Temp(Src) 97 F (36.1 C) (Temporal)  Resp 28  SpO2 98% Vital signs in last 24 hours: Temp:  [97  F (36.1 C)] 97 F (36.1 C) (01/15 0647) Pulse Rate:  [104-111] 108 (01/15 0901) Resp:  [18-28] 28 (01/15 0901) BP: (122-153)/(85-99) 122/86 mmHg (01/15 0901) SpO2:  [98 %-100 %] 98 % (01/15 0901)   Physical Exam  Constitutional: Appears well-developed and well-nourished.  Psych: Affect appropriate to situation Eyes: No scleral injection HENT: No OP obstrucion Head: Normocephalic.  Cardiovascular: Normal rate and regular rhythm. He has a scar from recent CABG Respiratory: Effort normal and breath sounds normal to anterior ascultation GI: Soft.  No distension. There is no tenderness.  Skin: WDI  Neuro: Mental Status: Patient is obtunded.  Cranial Nerves: II: Does not blink to threat. Pupils are equal, round, and reactive to light.   III,IV, VI: Eyes are midline V, VII: corneals are intact bilaterally IX,X: gag intact VIII, XII: Unable to assess secondary to patient's altered mental status.  Motor: intially appears to extensor posture in RUE, but then withdraws. Flexion vs withdrawal on left. In RLE, withdraws.  Sensory: As above.  Cerebellar: Will not perform   I have reviewed labs in epic and the results pertinent to this consultation are: INR 2.8  I have reviewed the images obtained:CT head - small  IPH with extensive IVH.   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 His exam seems discordant with the size of the intraparenchymal portion and therefore I think a significant portion of his symptoms currently are related to his IVH. Currently BP is ok, but has been hypertensive at times, will use labetalol PRN for goal < 140 systolic.   Recommendations: 1) Admit to ICU 2) no antiplatelets or anticoagulants, reversal with k-centra performed 3) blood pressure control with goal systolic < 140 systolic.  4) Frequent neuro checks 5) If symptoms worsen or there is decreased mental status, repeat stat head CT 6) CCM consult 7) hold home meds except  amiodarone(for afib)   This patient is critically ill and at significant risk of neurological worsening, death and care requires constant monitoring of vital signs, hemodynamics,respiratory and cardiac monitoring, neurological assessment, discussion with family, other specialists and medical decision making of high complexity. I spent 90 minutes of neurocritical care time  in the care of  this patient.  Ritta Slot, MD [facility] [phone]  If 7pm- 7am, please page neurology on call as listed in AMION. 11/05/2015  10:58 AM

## 2015-11-05 NOTE — Op Note (Signed)
NAME:  Jordan [name], Jordan [name]                 ACCOUNT NO.:  [id number]  MEDICAL RECORD NO.:  [id number]  LOCATION:  [id number]                        FACILITY:  [facility]  PHYSICIAN:  Hilda LiasErnesto Desteni Piscopo, M.D.   DATE OF BIRTH:  Jul 12, 1946  DATE OF PROCEDURE:  11/05/2015 DATE OF DISCHARGE:                              OPERATIVE REPORT   INDICATIONS FOR PROCEDURE:  Jordan [name] is a gentleman, who was brought this morning to the emergency room according to his wife, complaining of back pain and suddenly, he has decreased level of conscious.  In the emergency room, he was found that he has a bleed in the left side with extension into the ventricle.  Neurology was called.  The patient immediately got worse, he was intubated and had a repeat CT scan, which showed little bit worsening of the bleed.  We were called because of blood in the ventricle and hydrocephalus.  The surgical consent was obtained by the family.  Incidentally, the wife is a patient of mine.  I have reviewed the CT scan and indeed he has an intraparenchymal hematoma in the left side with extension into the ventricle going as far we can see into the fourth ventricle.  The plan is to proceed with the right intraventricular catheter for CSF drain.  DESCRIPTION OF PROCEDURE:  The patient's head was cleaned with ChloraPrep and drapes were applied.  Incision away from the midline and in front of the coronal, suture was made.  Retraction was made.  With the drill, we made a burr hole in the right frontal bone.  The dura mater was opened.  He still has quite a bit of bleeding secondary to the Coumadin therapy.  After that, I was able to introduce a catheter into the right frontal area.  Immediately, clear high pressure CSF came.  The drain was brought outside through a different incision and was hooked back to drain about 8 cm water for the next 24 hours.  The wound was sutured in place and the catheter was secured to the scalp.  After the procedure, I  talked to the wife.  I told her we do not know what the process will be done, I was on the consultant and will be monitor Jordan [name] every day just to be sure that CSF is working.  At the present time, I told her that there is no need to do any further surgery.  Lytes were checked for removal of the hematoma.          ______________________________ Hilda LiasErnesto Creedence Heiss, M.D.     EB/MEDQ  D:  11/05/2015  T:  11/05/2015  Job:  [id number]

## 2015-11-05 NOTE — Progress Notes (Signed)
Patient ID: Marcelo BaldyLarry W [name], male   DOB: 07/04/1946, 70 y.o.   MRN: [id number] Consult dictated. IVC done

## 2015-11-05 NOTE — ED Provider Notes (Addendum)
CSN: [id number]     Arrival date & time 11/05/15  [id number] History   First MD Initiated Contact with Patient 11/05/15 0701     Chief Complaint  Patient presents with  . Nausea  . Emesis     (Consider location/radiation/quality/duration/timing/severity/associated sxs/prior Treatment) Patient is a 70 y.o. male presenting with altered mental status. The history is provided by the EMS personnel and a relative (The patient yesterday afternoon stated that he was feeling bad started to have nausea and vomiting. Her this morning he complained of some neck discomfort and became more confused so his wife had him sent to the emergency department).  Altered Mental Status Presenting symptoms comment:  Confusion Severity:  Severe Most recent episode:  Today Episode history:  Continuous Timing:  Constant Progression:  Worsening Chronicity:  New Context: not alcohol use     Past Medical History  Diagnosis Date  . Hypertension   . Kidney stone   . Bipolar 1 disorder (HCC)   . Diverticulitis   . Anxiety   . Arthritis   . Sleep apnea   . Chronic headaches   . Stage III chronic kidney disease     Baseline creatinine 1.8  . Left inguinal hernia   . Atherosclerosis   . CAD (coronary artery disease)     a. 10/11/2015: total occlusion of OM1 and LCx, 50% stenosis in distal LM, 80% stenosis in LAD, 99% stenosis in RCA --> CABG recommended.  . Hiatal hernia   . 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 Antritis (stomach)    Past Surgical History  Procedure Laterality Date  . Appendectomy      70 years old  . Partial colectomy    . Spine surgery  2013  . Anterior cervical decomp/discectomy fusion N/A 12/04/2012    Procedure: ANTERIOR CERVICAL DECOMPRESSION/DISCECTOMY FUSION 2 LEVELS;  Surgeon: Temple PaciniHenry A Pool, MD;  Location: [facility];  Service: Neurosurgery;  Laterality: N/A;  Cervical five-six, six-seven anterior cervical decompression/discectomy fusion with allograft and plating  .  Esophagogastroduodenoscopy N/A 05/06/2015    Procedure: ESOPHAGOGASTRODUODENOSCOPY (EGD);  Surgeon: Vida RiggerMarc Magod, MD;  [facility];  Service: Endoscopy;  Laterality: N/A;  . Cardiac catheterization N/A 10/11/2015    Procedure: Right/Left Heart Cath and Coronary Angiography;  Surgeon: Lyn RecordsHenry W Smith, MD;  [facility];  Service: Cardiovascular;  Laterality: N/A;  . Coronary artery bypass graft N/A 10/13/2015    Procedure: CORONARY ARTERY BYPASS GRAFTING (CABG) x four using left internal mammary artery and left leg greater saphenous vein harvested endoscopically;  Surgeon: Alleen BorneBryan K Bartle, MD;  Location: [facility] OR;  Service: Open Heart Surgery;  Laterality: N/A;  . Tee without cardioversion N/A 10/13/2015    Procedure: TRANSESOPHAGEAL ECHOCARDIOGRAM (TEE);  Surgeon: Alleen BorneBryan K Bartle, MD;  [facility];  Service: Open Heart Surgery;  Laterality: N/A;  . Mitral valve replacement N/A 10/13/2015    Procedure: MITRAL VALVE (MV) REPLACEMENT;  Surgeon: Alleen BorneBryan K Bartle, MD;  Location: [facility] OR;  Service: Open Heart Surgery;  Laterality: N/A;  . Tricuspid valve replacement N/A 10/13/2015    Procedure: TRICUSPID VALVE REPAIR;  Surgeon: Alleen BorneBryan K Bartle, MD;  Location: [facility] OR;  Service: Open Heart Surgery;  Laterality: N/A;   Family History  Problem Relation Age of Onset  . Anesthesia problems Neg Hx   . Heart attack Father   . Liver cancer Paternal Grandfather     unsure of other cancers   . Diabetes Sister    Social History  Substance Use Topics  . Smoking status: Former Smoker -- 1.50 packs/day for 20 years    Quit date: 10/22/1983  . Smokeless tobacco: Never Used  . Alcohol Use: No    Review of Systems  Unable to perform ROS: Mental status change      Allergies  Review of patient's allergies indicates no known allergies.  Home Medications   Prior to Admission medications   Medication Sig Start Date End Date Taking? Authorizing Provider  amiodarone (PACERONE) 200 MG tablet  Take 1 tablet (200 mg total) by mouth daily. 10/25/15   Donielle Margaretann Loveless, PA-C  aspirin EC 81 MG EC tablet Take 1 tablet (81 mg total) by mouth daily. 10/25/15   Donielle Margaretann Loveless, PA-C  atorvastatin (LIPITOR) 10 MG tablet Take 1 tablet (10 mg total) by mouth daily. 10/25/15   Donielle Margaretann Loveless, PA-C  ferrous sulfate 325 (65 FE) MG tablet Take 1 tablet (325 mg total) by mouth daily with breakfast. For one month then stop. 10/25/15   Donielle Margaretann Loveless, PA-C  folic acid (FOLVITE) 1 MG tablet Take 1 tablet (1 mg total) by mouth daily. For one month then stop. 10/25/15   Donielle Margaretann Loveless, PA-C  furosemide (LASIX) 40 MG tablet Take 1 tablet (40 mg total) by mouth daily. 10/25/15   Donielle Margaretann Loveless, PA-C  guaiFENesin (MUCINEX) 600 MG 12 hr tablet Take 1 tablet (600 mg total) by mouth 2 (two) times daily. May stop after discharge from SNF. 10/25/15   Donielle Margaretann Loveless, PA-C  lansoprazole (PREVACID) 15 MG capsule Take 1 capsule (15 mg total) by mouth daily at 12 noon. Patient not taking: Reported on 10/08/2015 05/06/15   Maryruth Bun Rama, MD  oxyCODONE (OXY IR/ROXICODONE) 5 MG immediate release tablet Take 1-2 tablets (5-10 mg total) by mouth every 4 (four) hours as needed for severe pain. 10/31/15   Sharee Holster, NP  warfarin (COUMADIN) 2.5 MG tablet Take 1 tablet (2.5 mg total) by mouth daily. Or as directed. 10/25/15   Donielle Margaretann Loveless, PA-C   BP 122/86 mmHg  Pulse 108  Temp(Src) 97 F (36.1 C) (Temporal)  Resp 28  SpO2 98% Physical Exam  Constitutional: He appears well-developed.  HENT:  Head: Normocephalic.  Eyes: Conjunctivae and EOM are normal. No scleral icterus.  Neck: Neck supple. No thyromegaly present.  Cardiovascular: Normal rate and regular rhythm.  Exam reveals no gallop and no friction rub.   No murmur heard. Pulmonary/Chest: No stridor. He has no wheezes. He has no rales. He exhibits no tenderness.  Abdominal: He exhibits no distension. There is no tenderness. There  is no rebound.  Musculoskeletal: Normal range of motion. He exhibits no edema.  Lymphadenopathy:    He has no cervical adenopathy.  Neurological: He exhibits normal muscle tone. Coordination normal.  Patient is very lethargic and will follow commands occasionally. Will move all extremities to painful stimuli  Skin: No rash noted. No erythema.    ED Course  .Intubation Date/Time: 11/05/2015 10:42 AM Performed by: Bethann Berkshire Authorized by: Bethann Berkshire Comments: Patient was intubated with a 7-1/2 tube without any problems. He was premedicated with etomidate and succinylcholine   (including critical care time) Labs Review Labs Reviewed  COMPREHENSIVE METABOLIC PANEL - Abnormal; Notable for the following:    CO2 20 (*)    Glucose, Bld 194 (*)    Creatinine, Ser 2.30 (*)    Albumin 3.3 (*)    Alkaline Phosphatase 211 (*)    GFR  calc non Af Amer 27 (*)    GFR calc Af Amer 31 (*)    Anion gap 16 (*)    All other components within normal limits  CBC - Abnormal; Notable for the following:    WBC 11.9 (*)    RBC 3.86 (*)    Hemoglobin 10.5 (*)    HCT 33.6 (*)    Platelets 540 (*)    All other components within normal limits  URINALYSIS, ROUTINE W REFLEX MICROSCOPIC (NOT AT [facility]) - Abnormal; Notable for the following:    Hgb urine dipstick LARGE (*)    All other components within normal limits  LIPASE, BLOOD - Abnormal; Notable for the following:    Lipase 149 (*)    All other components within normal limits  PROTIME-INR - Abnormal; Notable for the following:    Prothrombin Time 29.1 (*)    INR 2.81 (*)    All other components within normal limits  DIFFERENTIAL - Abnormal; Notable for the following:    Neutro Abs 9.1 (*)    All other components within normal limits  BRAIN NATRIURETIC PEPTIDE - Abnormal; Notable for the following:    B Natriuretic Peptide 580.1 (*)    All other components within normal limits  CBC WITH DIFFERENTIAL/PLATELET - Abnormal; Notable for the  following:    WBC 12.7 (*)    RBC 3.70 (*)    Hemoglobin 9.7 (*)    HCT 31.8 (*)    Platelets 517 (*)    Neutro Abs 11.2 (*)    All other components within normal limits  I-STAT CHEM 8, ED - Abnormal; Notable for the following:    BUN 23 (*)    Creatinine, Ser 2.10 (*)    Glucose, Bld 188 (*)    Hemoglobin 12.6 (*)    HCT 37.0 (*)    All other components within normal limits  I-STAT CG4 LACTIC ACID, ED - Abnormal; Notable for the following:    Lactic Acid, Venous 6.15 (*)    All other components within normal limits  CBG MONITORING, ED - Abnormal; Notable for the following:    Glucose-Capillary 142 (*)    All other components within normal limits  I-STAT ARTERIAL BLOOD GAS, ED - Abnormal; Notable for the following:    pH, Arterial 7.477 (*)    pCO2 arterial 23.2 (*)    Bicarbonate 17.1 (*)    Acid-base deficit 5.0 (*)    All other components within normal limits  I-STAT CG4 LACTIC ACID, ED - Abnormal; Notable for the following:    Lactic Acid, Venous 4.86 (*)    All other components within normal limits  CULTURE, BLOOD (ROUTINE X 2)  CULTURE, BLOOD (ROUTINE X 2)  URINE CULTURE  URINE MICROSCOPIC-ADD ON  BLOOD GAS, ARTERIAL  URINE RAPID DRUG SCREEN, HOSP PERFORMED  I-STAT TROPOININ, ED    Imaging Review Ct Head Wo Contrast  11/05/2015  CLINICAL DATA:  Recent CABG with nausea and vomiting. Unable to follow commands. EXAM: CT HEAD WITHOUT CONTRAST TECHNIQUE: Contiguous axial images were obtained from the base of the skull through the vertex without intravenous contrast. COMPARISON:  07/28/2015 FINDINGS: Motion artifact over the superior images. Examination demonstrates an acute parenchymal hemorrhage abutting the atria of the left lateral ventricle measuring approximately 1.6 x 2.6 cm with mild adjacent edema. Moderate acute hemorrhage filling the left lateral ventricle and extending into the right lateral ventricle, third ventricle and fourth ventricles. Ventricles are within  normal in size although minimally  expanded compared to the prior exam. There is no evidence of midline shift. There is evidence of chronic ischemic microvascular disease. Small old lacunar infarct over the left lentiform nucleus. Remaining bones and soft tissues are within normal. IMPRESSION: New acute parenchymal hemorrhage abutting the atria of the left lateral ventricle measuring approximate 1.6 x 2.6 cm with mild adjacent edema. Hemorrhage extends into the ventricular system as described. Chronic ischemic microvascular disease. Small old left-sided lacunar infarct. Critical Value/emergent results were called by telephone at the time of interpretation on 11/05/2015 at 8:45 am to Dr. Bethann Berkshire , who verbally acknowledged these results. Electronically Signed   By: Elberta Fortis M.D.   On: 11/05/2015 08:45   Dg Chest Port 1 View  11/05/2015  CLINICAL DATA:  Nausea and vomiting. EXAM: PORTABLE CHEST 1 VIEW COMPARISON:  10/24/2015 FINDINGS: Sternotomy wires and prosthetic heart valve unchanged. Lungs are hypoinflated with minimal prominence of the perihilar markings suggesting mild vascular congestion. Mild opacification over the medial left base which may represent effusion and atelectasis although cannot exclude infection. Cardiomediastinal silhouette and remainder of the exam is unchanged. IMPRESSION: Hypo inflation with mild opacification of the medial left base which may be due to effusions/atelectasis although cannot exclude infection. Suggestion of minimal vascular congestion. Electronically Signed   By: Elberta Fortis M.D.   On: 11/05/2015 07:53   I have personally reviewed and evaluated these images and lab results as part of my medical decision-making.   EKG Interpretation   Date/Time:  Sunday November 05 2015 06:45:56 EST Ventricular Rate:  103 PR Interval:  107 QRS Duration: 89 QT Interval:  445 QTC Calculation: 583 R Axis:   33 Text Interpretation:  Sinus tachycardia Probable left atrial  enlargement  Abnormal R-wave progression, early transition Minimal ST depression,  lateral leads Prolonged QT interval Confirmed by DELO  MD, DOUGLAS ([id number])  on 11/05/2015 6:49:19 AM     CRITICAL CARE Performed by: Yidel Teuscher L Total critical care time: 50 minutes Critical care time was exclusive of separately billable procedures and treating other patients. Critical care was necessary to treat or prevent imminent or life-threatening deterioration. Critical care was time spent personally by me on the following activities: development of treatment plan with patient and/or surrogate as well as nursing, discussions with consultants, evaluation of patient's response to treatment, examination of patient, obtaining history from patient or surrogate, ordering and performing treatments and interventions, ordering and review of laboratory studies, ordering and review of radiographic studies, pulse oximetry and re-evaluation of patient's condition.  MDM   Final diagnoses:  None    Patient with intracerebral hemorrhage. On Coumadin patient given Kcentra and vitamin K. Neurology to admit the patient. Neurosurgery consult    Bethann Berkshire, MD 11/05/15 6045  Bethann Berkshire, MD 11/05/15 1043

## 2015-11-05 NOTE — ED Notes (Signed)
Preparing to intubate.  

## 2015-11-05 NOTE — ED Notes (Signed)
Pt to ED from home by EMS c/o NV. Pt lethargic, will open eyes only, unable to understand speech. Hx of CABG in Dec 2016. Wife reported dark emesis

## 2015-11-05 NOTE — Consult Note (Signed)
PULMONARY / CRITICAL CARE MEDICINE   Name: Jordan [name] MRN: [id number] DOB: 09-Jan-1946    ADMISSION DATE:  11/05/2015 CONSULTATION DATE:  11/05/2015  REFERRING MD:  Dr. Amada Jupiter, Neurology  CHIEF COMPLAINT: Altered mental status  HISTORY OF PRESENT ILLNESS:   70 yo male s/p CABG in December 2016 presented to ER with altered mental status, nausea and vomiting.  He also has hx of A fid and has been on coumadin.  CT head in ER showed peri-ventricular hemorrhage with IVH.  He was seen by neurology.  He received rapid reversal protocol for anticoagulation.  Neurosurgery has been consulted.  He was intubated in the ER.  PAST MEDICAL HISTORY :  He  has a past medical history of Hypertension; Kidney stone; Bipolar 1 disorder (HCC); Diverticulitis; Anxiety; Arthritis; Sleep apnea; Chronic headaches; Stage III chronic kidney disease; Left inguinal hernia; Atherosclerosis; CAD (coronary artery disease); Hiatal hernia; Esophagitis versus Mallory-Weiss tear; and Antritis (stomach).  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  No Known Allergies  No current facility-administered medications on file prior to encounter.   Current Outpatient Prescriptions on File Prior to Encounter  Medication Sig  . amiodarone (PACERONE) 200 MG tablet Take 1 tablet (200 mg total) by mouth daily.  Marland Kitchen aspirin EC 81 MG EC tablet Take 1 tablet (81 mg total) by mouth daily.  Marland Kitchen atorvastatin (LIPITOR) 10 MG tablet Take 1 tablet (10 mg total) by mouth daily.  . ferrous sulfate 325 (65 FE) MG tablet Take 1 tablet (325  mg total) by mouth daily with breakfast. For one month then stop.  . folic acid (FOLVITE) 1 MG tablet Take 1 tablet (1 mg total) by mouth daily. For one month then stop.  . furosemide (LASIX) 40 MG tablet Take 1 tablet (40 mg total) by mouth daily.  Marland Kitchen guaiFENesin (MUCINEX) 600 MG 12 hr tablet Take 1 tablet (600 mg total) by mouth 2 (two) times daily. May stop after discharge from SNF.  Marland Kitchen lansoprazole (PREVACID) 15 MG capsule Take 1 capsule (15 mg total) by mouth daily at 12 noon. (Patient not taking: Reported on 10/08/2015)  . oxyCODONE (OXY IR/ROXICODONE) 5 MG immediate release tablet Take 1-2 tablets (5-10 mg total) by mouth every 4 (four) hours as needed for severe pain.  Marland Kitchen warfarin (COUMADIN) 2.5 MG tablet Take 1 tablet (2.5 mg total) by mouth daily. Or as directed.  . [DISCONTINUED] buPROPion (WELLBUTRIN SR) 150 MG 12 hr tablet Take 450 mg by mouth daily.    FAMILY HISTORY:  His indicated that his mother is alive. He indicated that his father is deceased. He indicated that all of his three sisters are alive. He indicated that his brother is deceased.   SOCIAL HISTORY: He  reports that he quit smoking about 32 years ago. He has never used smokeless tobacco. He reports that he does not drink alcohol or use illicit drugs.  REVIEW OF SYSTEMS:   Unable to obtain.  SUBJECTIVE:   VITAL SIGNS: BP 192/86 mmHg  Pulse 107  Temp(Src) 97 F (36.1 C) (Temporal)  Resp 18  Ht 5\' 9"  (1.753 m)  SpO2 76%  HEMODYNAMICS:    VENTILATOR SETTINGS: Vent Mode:  [-] PRVC FiO2 (%):  [100 %] 100 %  Set Rate:  [16 bmp] 16 bmp Vt Set:  [500 mL] 500 mL PEEP:  [8 cmH20] 8 cmH20  INTAKE / OUTPUT:    PHYSICAL EXAMINATION: General:  sedated Neuro:  RASS -3 HEENT:  ETT in place Cardiovascular:  Irregular, no murmur Lungs:  No wheeze Abdomen:  Soft, non tender Musculoskeletal:  1+ edema Skin:  No rashes  LABS:  BMET  Recent Labs Lab 11/05/15 0639 11/05/15 0729  NA 142 142  K 3.6 3.5  CL  106 104  CO2 20*  --   BUN 18 23*  CREATININE 2.30* 2.10*  GLUCOSE 194* 188*    Electrolytes  Recent Labs Lab 11/05/15 0639  CALCIUM 9.0    CBC  Recent Labs Lab 11/05/15 0639 11/05/15 0729 11/05/15 0844  WBC 11.9*  --  12.7*  HGB 10.5* 12.6* 9.7*  HCT 33.6* 37.0* 31.8*  PLT 540*  --  517*    Coag's  Recent Labs Lab 11/05/15 0639  INR 2.81*    Sepsis Markers  Recent Labs Lab 11/05/15 0811 11/05/15 0858 11/05/15 1108  LATICACIDVEN 6.15* 4.86* 4.92*    ABG  Recent Labs Lab 11/05/15 0809  PHART 7.477*  PCO2ART 23.2*  PO2ART 88.0    Liver Enzymes  Recent Labs Lab 11/05/15 0639  AST 41  ALT 48  ALKPHOS 211*  BILITOT 0.4  ALBUMIN 3.3*    Cardiac Enzymes No results for input(s): TROPONINI, PROBNP in the last 168 hours.  Glucose  Recent Labs Lab 11/05/15 0733  GLUCAP 142*    Imaging Ct Head Wo Contrast  11/05/2015  CLINICAL DATA:  Intracranial hemorrhage EXAM: CT HEAD WITHOUT CONTRAST TECHNIQUE: Contiguous axial images were obtained from the base of the skull through the vertex without intravenous contrast. COMPARISON:  Head CT 11/05/2015 at 827 hours FINDINGS: Large intraventricular hemorrhage predominantly on the LEFT. The high-density hemorrhage within the LEFT lateral ventricle/atria is increased in volume measuring 3.7 x 3.3 cm (image 17, series 3) compared to 2.7 by 2.4 cm. There is increased edema surrounding the LEFT ventricle atria. The volume of blood within the temporal horn is slightly increased measuring 20 mm compares 17mm. There is no significant midline shift. There is hemorrhage within the third ventricle which is not changed in volume. Small amount hemorrhage in the bilateral anterior ventricles is not changed. Additionally there is a blood within the fourth ventricle which is unchanged in volume. No parenchymal hemorrhage identified. No extra-axial fluid collections. Patent basilar cisterns. IMPRESSION: 1. Increase in volume  of hemorrhage within the LEFT lateral ventricle anomaly within the LEFT atria and to lesser degree the temporal horn. 2. Increase in parenchymal edema surrounding the LEFT lateral ventricle hemorrhage. 3. No midline shift.  Basilar cisterns are patent. 4. No change in volume of hemorrhage within the third ventricle and fourth ventricle and anterior ventricles. Findings conveyed toJOSEPH ZAMMIT on 11/05/2015  at10:59. Electronically Signed   By: Genevive BiStewart  Edmunds M.D.   On: 11/05/2015 11:02   Ct Head Wo Contrast  11/05/2015  CLINICAL DATA:  Recent CABG with nausea and vomiting. Unable to follow commands. EXAM: CT HEAD WITHOUT CONTRAST TECHNIQUE: Contiguous axial images were obtained from the base of the skull through the vertex without intravenous contrast. COMPARISON:  07/28/2015 FINDINGS: Motion artifact over the superior images. Examination demonstrates an acute parenchymal hemorrhage abutting the atria of the left lateral ventricle measuring approximately 1.6 x 2.6 cm with mild adjacent edema. Moderate acute hemorrhage filling the left lateral ventricle and extending into the right  lateral ventricle, third ventricle and fourth ventricles. Ventricles are within normal in size although minimally expanded compared to the prior exam. There is no evidence of midline shift. There is evidence of chronic ischemic microvascular disease. Small old lacunar infarct over the left lentiform nucleus. Remaining bones and soft tissues are within normal. IMPRESSION: New acute parenchymal hemorrhage abutting the atria of the left lateral ventricle measuring approximate 1.6 x 2.6 cm with mild adjacent edema. Hemorrhage extends into the ventricular system as described. Chronic ischemic microvascular disease. Small old left-sided lacunar infarct. Critical Value/emergent results were called by telephone at the time of interpretation on 11/05/2015 at 8:45 am to Dr. Bethann Berkshire , who verbally acknowledged these results. Electronically  Signed   By: Elberta Fortis M.D.   On: 11/05/2015 08:45   Dg Chest Port 1 View  11/05/2015  CLINICAL DATA:  Nausea and vomiting. EXAM: PORTABLE CHEST 1 VIEW COMPARISON:  10/24/2015 FINDINGS: Sternotomy wires and prosthetic heart valve unchanged. Lungs are hypoinflated with minimal prominence of the perihilar markings suggesting mild vascular congestion. Mild opacification over the medial left base which may represent effusion and atelectasis although cannot exclude infection. Cardiomediastinal silhouette and remainder of the exam is unchanged. IMPRESSION: Hypo inflation with mild opacification of the medial left base which may be due to effusions/atelectasis although cannot exclude infection. Suggestion of minimal vascular congestion. Electronically Signed   By: Elberta Fortis M.D.   On: 11/05/2015 07:53     STUDIES:  1/15 CT head >> acute parenchymal hemorrhage abutting the atria of the left lateral ventricle measuring approximate 1.6 x 2.6 cm, extends into ventricular system  CULTURES: 1/15 Blood >> 1/15 Urine >>  ANTIBIOTICS: 1/15 Vancomycin >> 1/15 1/15 Zosyn >> 1/15  SIGNIFICANT EVENTS: 1/15 Admit, Kcentra, neurosurgery consulted  LINES/TUBES: 1/15 ETT >> 1/15 IVD >> 1/15 CVL >>   DISCUSSION: 70 yo male former smoker with N/V, altered mental status with ICH and IVH.  Had recent CABG and A fib, and was on coumadin as outpt.  Also has hx of HTN, Bipolar, OSA, Stage 3 CKD.  ASSESSMENT / PLAN:  NEUROLOGIC A:   Acute encephalopathy 2nd to ICH, IVH. P:   RASS goal: -1 IVD per neurosurgery Neurology to decide about 3% NS >> will place CVL  PULMONARY A: Compromised airway in setting of ICH. Hx of OSA. P:   Full vent support F/u CXR, ABG  CARDIOVASCULAR A:  Hx of CAD s/p CABG. Hx of A fib on coumadin as outpt. Hx of HTN, HLD. P:  Continue amiodarone, lipitor No ASA, coumadin Goal SBP < 140 per neurology  RENAL A:   CKD stage 3. Lactic acidosis in setting of  ICH. P:   Monitor renal fx, urine outpt  GASTROINTESTINAL A:   Nutrition. P:   NPO Tube feeds if not able to extubate soon Protonix for SUP  HEMATOLOGIC A:   Anemia of critical illness. Coagulopathy 2nd to coumadin. P:  F/u CBC, INR SCD for DVT prevention  INFECTIOUS A:   Absolutely no evidence of infection or sepsis. P:   D/c Abx   ENDOCRINE A:   Hyperglycemia. P:   SSI  D/w Dr. Amada Jupiter  CC time 38 minutes.  Coralyn Helling, MD [facility] Pulmonary/Critical Care 11/05/2015, 12:02 PM Pager:  [phone] After 3pm call: [phone]

## 2015-11-05 NOTE — Progress Notes (Signed)
ANTIBIOTIC and COAG REVERSAL CONSULT NOTE - INITIAL  Pharmacy Consult for vancomycin/zosyn and coumadin reversal Indication: sepsis/asp PNA and IVH on coumadin  No Known Allergies  Patient Measurements:   Weight: 96.2 kg  Vital Signs: Temp: 97 F (36.1 C) (01/15 0647) Temp Source: Temporal (01/15 0647) BP: 122/86 mmHg (01/15 0901) Pulse Rate: 108 (01/15 0901) Intake/Output from previous day:   Intake/Output from this shift:    Labs:  Recent Labs  11/05/15 0639 11/05/15 0729 11/05/15 0844  WBC 11.9*  --  12.7*  HGB 10.5* 12.6* 9.7*  PLT 540*  --  517*  CREATININE 2.30* 2.10*  --    Estimated Creatinine Clearance: 35 mL/min (by C-G formula based on Cr of 2.1). No results for input(s): VANCOTROUGH, VANCOPEAK, VANCORANDOM, GENTTROUGH, GENTPEAK, GENTRANDOM, TOBRATROUGH, TOBRAPEAK, TOBRARND, AMIKACINPEAK, AMIKACINTROU, AMIKACIN in the last 72 hours.   Microbiology: Recent Results (from the past 720 hour(s))  Surgical pcr screen     Status: None   Collection Time: 10/11/15  5:13 AM  Result Value Ref Range Status   MRSA, PCR NEGATIVE NEGATIVE Final   Staphylococcus aureus NEGATIVE NEGATIVE Final    Comment:        The Xpert SA Assay (FDA approved for NASAL specimens in patients over 72 years of age), is one component of a comprehensive surveillance program.  Test performance has been validated by [facility] for patients greater than or equal to 72 year old. It is not intended to diagnose infection nor to guide or monitor treatment.     Medical History: Past Medical History  Diagnosis Date  . Hypertension   . Kidney stone   . Bipolar 1 disorder (HCC)   . Diverticulitis   . Anxiety   . Arthritis   . Sleep apnea   . Chronic headaches   . Stage III chronic kidney disease     Baseline creatinine 1.8  . Left inguinal hernia   . Atherosclerosis   . CAD (coronary artery disease)     a. 10/11/2015: total occlusion of OM1 and LCx, 50% stenosis in distal LM,  80% stenosis in LAD, 99% stenosis in RCA --> CABG recommended.  . Hiatal hernia   . 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 Antritis (stomach)     Assessment: 70 yo M on coumadin PTA.  In ED with AMS.  Pharmacy consulted to dose vanc/zosyn for sepsis r/o asp PNA and Kcentra for coumadin reversal for IVH.  ID: WBC 12.7, LA 4.92, AF.  Wt 96.2 kg  vanc 1/15>> Zosyn 1/15>>  1/15 BCx2>> 1/15 Ucx>>   Coag: coum PTA dose 2.5 qday. New IPH w/ extensive IVH. 1/15 INR 2.81 Kcentra 2135 units given 0919, Vit K 10 mg IV given 0945 Hg 12. Is istat, serum Hg 9.7, pltc 517  Goal of Therapy:  Vancomycin trough level 15-20 mcg/ml  Reverse coumadin  Plan:  - zosyn 3.375 gm IV x 1 dose over 30 min, then zosyn 3.375 gm IV q8h, infuse each dose over 4 hours - vanc 1 gm IV x 2 doses in ED to make a 2 gm loading dose followed by vanc 1250 mg IV q24 - f/u renal fxn, wbc, temp, culture data - coumadin reversal with Kcentra 2135 units and vit K 10mg  IV To OR w/ Dr Jeral FruitBotero - repeat INR in process  Herby AbrahamMichelle T. Sheilyn Boehlke, Pharm.D. [phone] 11/05/2015 11:20 AM

## 2015-11-05 NOTE — Procedures (Signed)
Central Venous Catheter Insertion Procedure Note Jordan [name]  Procedure: Insertion of Central Venous Catheter Indications: Assessment of intravascular volume, Drug and/or fluid administration and Frequent blood sampling  Procedure Details Consent: Risks of procedure as well as the alternatives and risks of each were explained to the (patient/caregiver).  Consent for procedure obtained. Time Out: Verified patient identification, verified procedure, site/side was marked, verified correct patient position, special equipment/implants available, medications/allergies/relevent history reviewed, required imaging and test results available.  Performed  Maximum sterile technique was used including antiseptics, cap, gloves, gown, hand hygiene, mask and sheet. Skin prep: Chlorhexidine; local anesthetic administered A antimicrobial bonded/coated triple lumen catheter was placed in the left internal jugular vein to 18 cm using the Seldinger technique.  Sutured in place.    Evaluation Blood flow good Complications: No apparent complications Patient did tolerate procedure well. Chest X-ray ordered to verify placement.  CXR: pending.   Procedure performed under direct supervision of Dr. Craige CottaSood and with ultrasound guidance for real time vessel cannulation.      Jordan BrimBrandi Marybell Robards, NP-C [facility] Pgr: [phone] or if no answer [phone] 11/05/2015, 12:54 PM

## 2015-11-06 ENCOUNTER — Inpatient Hospital Stay (HOSPITAL_COMMUNITY): Payer: PPO

## 2015-11-06 ENCOUNTER — Encounter (HOSPITAL_COMMUNITY): Payer: Self-pay | Admitting: Radiology

## 2015-11-06 DIAGNOSIS — G911 Obstructive hydrocephalus: Secondary | ICD-10-CM

## 2015-11-06 DIAGNOSIS — I619 Nontraumatic intracerebral hemorrhage, unspecified: Secondary | ICD-10-CM

## 2015-11-06 DIAGNOSIS — J9600 Acute respiratory failure, unspecified whether with hypoxia or hypercapnia: Secondary | ICD-10-CM

## 2015-11-06 DIAGNOSIS — G936 Cerebral edema: Secondary | ICD-10-CM

## 2015-11-06 LAB — BLOOD GAS, ARTERIAL
Acid-base deficit: 3.1 mmol/L — ABNORMAL HIGH (ref 0.0–2.0)
BICARBONATE: 19.1 meq/L — AB (ref 20.0–24.0)
FIO2: 0.4
LHR: 26 {breaths}/min
O2 SAT: 98.6 %
PEEP/CPAP: 5 cmH2O
Patient temperature: 98.7
SAMPLE TYPE: 36496
TCO2: 19.8 mmol/L (ref 0–100)
VT: 570 mL
pCO2 arterial: 22.5 mmHg — ABNORMAL LOW (ref 35.0–45.0)
pH, Arterial: 7.54 — ABNORMAL HIGH (ref 7.350–7.450)
pO2, Arterial: 111 mmHg — ABNORMAL HIGH (ref 80.0–100.0)

## 2015-11-06 LAB — PROTIME-INR
INR: 1.31 (ref 0.00–1.49)
INR: 1.25 (ref 0.00–1.49)
INR: 1.22 (ref 0.00–1.49)
PROTHROMBIN TIME: 16.4 s — AB (ref 11.6–15.2)
Prothrombin Time: 15.8 seconds — ABNORMAL HIGH (ref 11.6–15.2)
Prothrombin Time: 15.6 seconds — ABNORMAL HIGH (ref 11.6–15.2)

## 2015-11-06 LAB — BASIC METABOLIC PANEL
ANION GAP: 7 (ref 5–15)
BUN: 13 mg/dL (ref 6–20)
CALCIUM: 8.5 mg/dL — AB (ref 8.9–10.3)
CO2: 22 mmol/L (ref 22–32)
CREATININE: 1.96 mg/dL — AB (ref 0.61–1.24)
Chloride: 114 mmol/L — ABNORMAL HIGH (ref 101–111)
GFR calc Af Amer: 38 mL/min — ABNORMAL LOW (ref >60)
GFR, EST NON AFRICAN AMERICAN: 33 mL/min — AB (ref >60)
GLUCOSE: 102 mg/dL — AB (ref 65–99)
Potassium: 3.4 mmol/L — ABNORMAL LOW (ref 3.5–5.1)
Sodium: 143 mmol/L (ref 135–145)

## 2015-11-06 LAB — CBC
HCT: 26.6 % — ABNORMAL LOW (ref 39.0–52.0)
HEMOGLOBIN: 8.2 g/dL — AB (ref 13.0–17.0)
MCH: 26.2 pg (ref 26.0–34.0)
MCHC: 30.8 g/dL (ref 30.0–36.0)
MCV: 85 fL (ref 78.0–100.0)
PLATELETS: 436 10*3/uL — AB (ref 150–400)
RBC: 3.13 MIL/uL — ABNORMAL LOW (ref 4.22–5.81)
RDW: 15.2 % (ref 11.5–15.5)
WBC: 8.8 10*3/uL (ref 4.0–10.5)

## 2015-11-06 LAB — URINE CULTURE: CULTURE: NO GROWTH

## 2015-11-06 MED ORDER — VITAL HIGH PROTEIN PO LIQD
1000.0000 mL | ORAL | Status: DC
  Administered 2015-11-06 – 2015-11-07 (×3): 1000 mL

## 2015-11-06 MED ORDER — FUROSEMIDE 10 MG/ML IJ SOLN
40.0000 mg | Freq: Four times a day (QID) | INTRAMUSCULAR | Status: AC
  Administered 2015-11-06 – 2015-11-07 (×3): 40 mg via INTRAVENOUS
  Filled 2015-11-06 (×3): qty 4

## 2015-11-06 MED ORDER — POTASSIUM CHLORIDE 20 MEQ/15ML (10%) PO SOLN
40.0000 meq | Freq: Once | ORAL | Status: AC
  Administered 2015-11-06: 40 meq
  Filled 2015-11-06: qty 30

## 2015-11-06 NOTE — Progress Notes (Signed)
eLink Physician-Brief Progress Note Patient Name: Marcelo BaldyLarry W Slaven DOB: 1946/03/24 MRN: [id number]   Date of Service  11/06/2015  HPI/Events of Note    eICU Interventions  Hypokalemia -repleted      Intervention Category Intermediate Interventions: Electrolyte abnormality - evaluation and management  ALVA,RAKESH V. 11/06/2015, 5:02 PM

## 2015-11-06 NOTE — Progress Notes (Signed)
RT called to room due to patient had pulled tube slightly while moving around. RT advanced the tube back to 24cm at the lip with confirmed bilateral breath sounds.

## 2015-11-06 NOTE — Progress Notes (Signed)
STROKE TEAM PROGRESS NOTE   HISTORY Jordan [name] is a 70 y.o. male with a history of htn, recent NSTEMI in December who underwent CABG at that time. He was found to have Afib with RVR and was started on coumadin. He was doing well until last night, when he began to feel ill and have n/v. This AM, he was found to be confused and EMS was called. He was found to have a new peri-ventricular hemorrhage with large amounts of IVH. He was initially following commands when he first presented, but has subsequently worsened and is no longer doing so. ICH score: 2 (though with IVH, may be > 30cc which would be a 3). He was last known well 11/04/2015. Patient was not administered TPA secondary to presence of hemorrhage. He was admitted to the neuro ICU for further evaluation and treatment.   SUBJECTIVE (INTERVAL HISTORY) His RN is at the bedside.  Is following commands for the RN. Wife and daughter and wife and son arrived after rounds and I met with them. Blood pressure is adequately controlled and INR has been reversed with Kcentra  OBJECTIVE Temp:  [98.1 F (36.7 C)-101.2 F (38.4 C)] 98.7 F (37.1 C) (01/16 0400) Pulse Rate:  [84-111] 89 (01/16 0700) Cardiac Rhythm:  [-] Normal sinus rhythm (01/15 2000) Resp:  [4-47] 15 (01/16 0700) BP: (83-192)/(48-111) 128/88 mmHg (01/16 0700) SpO2:  [76 %-100 %] 100 % (01/16 0700) FiO2 (%):  [40 %-100 %] 40 % (01/16 0415) Weight:  [97.4 kg (214 lb 11.7 oz)] 97.4 kg (214 lb 11.7 oz) (01/15 1118)  CBC:  Recent Labs Lab 11/05/15 0639  11/05/15 0844 11/06/15 0548  WBC 11.9*  --  12.7* 8.8  NEUTROABS 9.1*  --  11.2*  --   HGB 10.5*  < > 9.7* 8.2*  HCT 33.6*  < > 31.8* 26.6*  MCV 87.0  --  85.9 85.0  PLT 540*  --  517* 436*  < > = values in this interval not displayed.  Basic Metabolic Panel:  Recent Labs Lab 11/05/15 0639 11/05/15 0729 11/06/15 0548  NA 142 142 143  K 3.6 3.5 3.4*  CL 106 104 114*  CO2 20*  --  22  GLUCOSE 194* 188* 102*  BUN 18  23* 13  CREATININE 2.30* 2.10* 1.96*  CALCIUM 9.0  --  8.5*    Lipid Panel:    Component Value Date/Time   TRIG 136 11/05/2015 1650   HgbA1c:  Lab Results  Component Value Date   HGBA1C 5.6 10/12/2015   Urine Drug Screen:    Component Value Date/Time   LABOPIA NONE DETECTED 11/05/2015 0751   COCAINSCRNUR NONE DETECTED 11/05/2015 0751   LABBENZ NONE DETECTED 11/05/2015 0751   AMPHETMU NONE DETECTED 11/05/2015 0751   THCU NONE DETECTED 11/05/2015 0751   LABBARB NONE DETECTED 11/05/2015 0751      IMAGING  Ct Head Wo Contrast 11/06/2015   Evolving LEFT intraparenchymal hematoma with intraventricular extension. Interval placement of RIGHT frontal ventriculostomy catheter with resolving hydrocephalus, however there is persistent LEFT temporal horn entrapment. 7 mm LEFT-to-RIGHT midline shift is likely postprocedural. New small amount of RIGHT frontal extra-axial pneumocephalus.  11/05/2015  1. Increase in volume of hemorrhage within the LEFT lateral ventricle anomaly within the LEFT atria and to lesser degree the temporal horn. 2. Increase in parenchymal edema surrounding the LEFT lateral ventricle hemorrhage. 3. No midline shift.  Basilar cisterns are patent. 4. No change in volume of hemorrhage within the third  ventricle and fourth ventricle and anterior ventricles.  11/05/2015   New acute parenchymal hemorrhage abutting the atria of the left lateral ventricle measuring approximate 1.6 x 2.6 cm with mild adjacent edema. Hemorrhage extends into the ventricular system as described. Chronic ischemic microvascular disease. Small old left-sided lacunar infarct.   Dg Chest Port 1 View 11/06/2015  1. Lines and tubes in stable position. 2. Heart size stable. No pulmonary venous congestion on today's exam. 3. Persistent but improving left lower lobe infiltrate/edema. Low lung volumes. Small left pleural effusion cannot be excluded.  11/05/2015  Hypoinflation with findings suggesting mild vascular  congestion. Tubes and lines as described. Interval placement left IJ central venous catheter with tip over the SVC. No pneumothorax.  11/05/2015   Endotracheal tube and nasogastric tube in appropriate position. Improved aeration with decreased bibasilar atelectasis.  11/05/2015   Hypo inflation with mild opacification of the medial left base which may be due to effusions/atelectasis although cannot exclude infection. Suggestion of minimal vascular congestion.      PHYSICAL EXAM Elderly Caucasian male who is intubated but not sedated. . Afebrile. Head is nontraumatic. Neck is supple without bruit.    Cardiac exam no murmur or gallop. Lungs are clear to auscultation. Distal pulses are well felt. Neurological Exam :  Drowsy but easily awakens and follows commands. Left gaze preference able to look to the right barely past midline. Blinks to threat on the left but not on the right. Pupils irregular but reactive. Fundi were not visualized. Vision acuity cannot be reliably tested. Mild right lower facial weakness. Tongue midline. Motor system exam antigravity and normal strength on the left side. Right hemiplegia with grade 1-2/5  strength on the right. Diminished on the right. Sensation appear diminished on the right. Right plantar upgoing left downgoing. Gait was not tested. ASSESSMENT/PLAN Mr. Jordan [name] is a 70 y.o. male with history of hypertension, recent NSTEMI status post CABG in December, AFIB w/ RVR on coumadin presenting with confusion. CT show a periventricular hemorrhage with IVH.  Stroke:  left parietal intraparenchymal hemorrhage with intraventricular extension with increase in hemorrhage size since admission. Hydrocephalus. Cerebral edema. Hemorrhage secondary to warfarin related coagulopathy  Resultant  R HP  IVC insertion R frontal (Botero 1/15). Normal amt drainage  CT Still with significant edema with 89m shift  Repeat CT head in am  HgbA1c pending  SCDs for VTE  prophylaxis  Diet NPO time specified  aspirin 81 mg daily and warfarin daily prior to admission, now on No antithrombotic due to hemorrhage  Ongoing aggressive stroke risk factor management  Therapy recommendations:  pending   Disposition:  pending   Acute respiratory failure  intubated in the ED after neurologic worsening  Ok to extubate from stroke standpoint  Atrial Fibrillation  Home anticoagulation:  Warfarin  INR 2.81 on admission  Rapid reversal protocol due to IVH  INR now 1.25  Consider aspirin after 1 week and NOAC after 1 month if hemorrhage remains stable  Hypertension  Stable, low at times  Hyperlipidemia  Home meds:  Lipitor 10  Resumed lipitor in hospital once able to swallow of feeding tube placed  Other Stroke Risk Factors  Advanced age  Former Cigarette smoker, quit smoking 32 years ago   Obesity, Body mass index is 31.7 kg/(m^2).   Coronary artery disease - NSTEMI s/p CABG 12/16  Obstructive sleep apnea  Other Active Problems  Bipolar d/o  Stage III chronic kidney disease, baseline creatinine 1.8  Hospital day #  [place] for Pager information 11/06/2015 9:32 AM  I have personally examined this patient, reviewed notes, independently viewed imaging studies, participated in medical decision making and plan of care. I have made any additions or clarifications directly to the above note. Agree with note above. He presented with left subcortical parenchymal hemorrhage with intraventricular extension and hydrocephalus secondary to hypertension and warfarin coagulopathy. This was reversed with Kcentra infusion and has shown  neurological improvement after external ventricular drainage. He remains at risk for neurological worsening, hematoma expansion, cytotoxic edema and hydrocephalus and needs ongoing aggressive management of blood pressure, ventilatory support and close neurological monitoring. I had  a long discussion with the patient's wife and son at the bedside regarding his prognosis, plan for treatment and answered questions. Plan continue supportive care for now if there is further neurological worsening may need to consider starting hypertonic saline. Repeat CT scan of the head without contrast tomorrow morning. This patient is critically ill and at significant risk of neurological worsening, death and care requires constant monitoring of vital signs, hemodynamics,respiratory and cardiac monitoring, extensive review of multiple databases, frequent neurological assessment, discussion with family, other specialists and medical decision making of high complexity.I have made any additions or clarifications directly to the above note.This critical care time does not reflect procedure time, or teaching time or supervisory time of PA/NP/Med Resident etc but could involve care discussion time.  I spent 60 minutes of neurocritical care time  in the care of  this patient.    Antony Contras, MD Medical Director [facility] Pager: [phone] 11/06/2015 3:24 PM    To contact Stroke Continuity provider, please refer to [email or web address] After hours, contact General Neurology

## 2015-11-06 NOTE — Progress Notes (Signed)
Patient ID: Jordan BaldyLarry W [name], male   DOB: January 31, 1946, 70 y.o.   MRN: [id number] Intubated, IVC working. Spoke with wife

## 2015-11-06 NOTE — Progress Notes (Signed)
Attempted weaning, increased plateau pressures.  CRI.  Will give two doses of lasix today and continue PS trials then attempt extubation in AM.  Alyson ReedyWesam G. Kemal Amores, M.D. [facility]. Pager: [phone]. After hours pager: [phone].

## 2015-11-06 NOTE — Progress Notes (Signed)
PULMONARY / CRITICAL CARE MEDICINE   Name: Jordan [name] MRN: [id number] DOB: [birth year]    ADMISSION DATE:  11/05/2015 CONSULTATION DATE:  11/05/2015  REFERRING MD:  Dr. Amada Jupiter, Neurology  CHIEF COMPLAINT: Altered mental status  HISTORY OF PRESENT ILLNESS:   70 yo male s/p CABG in December 2016 presented to ER with altered mental status, nausea and vomiting.  He also has hx of A fid and has been on coumadin.  CT head in ER showed peri-ventricular hemorrhage with IVH.  He was seen by neurology.  He received rapid reversal protocol for anticoagulation.  Neurosurgery has been consulted.  He was intubated in the ER.  PAST MEDICAL HISTORY :  He  has a past medical history of Hypertension; Kidney stone; Bipolar 1 disorder (HCC); Diverticulitis; Anxiety; Arthritis; Sleep apnea; Chronic headaches; Stage III chronic kidney disease; Left inguinal hernia; Atherosclerosis; CAD (coronary artery disease); Hiatal hernia; Esophagitis versus Mallory-Weiss tear; and Antritis (stomach).  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  No Known Allergies  No current facility-administered medications on file prior to encounter.   Current Outpatient Prescriptions on File Prior to Encounter  Medication Sig  . amiodarone (PACERONE) 200 MG tablet Take 1 tablet (200 mg total) by mouth daily.  Marland Kitchen aspirin EC 81 MG EC tablet Take 1 tablet (81 mg total) by mouth daily.  Marland Kitchen atorvastatin (LIPITOR) 10 MG tablet Take 1 tablet (10 mg total) by mouth daily.  . ferrous sulfate 325 (65 FE) MG tablet Take 1 tablet (325  mg total) by mouth daily with breakfast. For one month then stop.  . folic acid (FOLVITE) 1 MG tablet Take 1 tablet (1 mg total) by mouth daily. For one month then stop.  . furosemide (LASIX) 40 MG tablet Take 1 tablet (40 mg total) by mouth daily.  Marland Kitchen guaiFENesin (MUCINEX) 600 MG 12 hr tablet Take 1 tablet (600 mg total) by mouth 2 (two) times daily. May stop after discharge from SNF.  Marland Kitchen lansoprazole (PREVACID) 15 MG capsule Take 1 capsule (15 mg total) by mouth daily at 12 noon. (Patient not taking: Reported on 10/08/2015)  . oxyCODONE (OXY IR/ROXICODONE) 5 MG immediate release tablet Take 1-2 tablets (5-10 mg total) by mouth every 4 (four) hours as needed for severe pain.  Marland Kitchen warfarin (COUMADIN) 2.5 MG tablet Take 1 tablet (2.5 mg total) by mouth daily. Or as directed.  . [DISCONTINUED] buPROPion (WELLBUTRIN SR) 150 MG 12 hr tablet Take 450 mg by mouth daily.    FAMILY HISTORY:  His indicated that his mother is alive. He indicated that his father is deceased. He indicated that all of his three sisters are alive. He indicated that his brother is deceased.   SOCIAL HISTORY: He  reports that he quit smoking about 32 years ago. He has never used smokeless tobacco. He reports that he does not drink alcohol or use illicit drugs.  REVIEW OF SYSTEMS:   Unable to obtain.  SUBJECTIVE:   VITAL SIGNS: BP 137/85 mmHg  Pulse 82  Temp(Src) 98.9 F (37.2 C) (Axillary)  Resp 20  Ht 5\' 9"  (1.753 m)  Wt 97.4 kg (214 lb 11.7 oz)  BMI 31.70 kg/m2  SpO2 100%  HEMODYNAMICS:    VENTILATOR SETTINGS:  Vent Mode:  [-] PSV;CPAP FiO2 (%):  [40 %-50 %] 40 % Set Rate:  [26 bmp] 26 bmp Vt Set:  [570 mL] 570 mL PEEP:  [5 cmH20] 5 cmH20 Pressure Support:  [5 cmH20] 5 cmH20 Plateau Pressure:  [17 cmH20-21 cmH20] 17 cmH20  INTAKE / OUTPUT: I/O last 3 completed shifts: In: 351.8 [I.V.:351.8] Out: 1301 [Urine:1045; Drains:256]  PHYSICAL EXAMINATION: General:  Arousable and moves the left more than  right. Neuro:  RASS -1, moving L>R but not to command. HEENT:  ETT in place, EVD noted, PERRL, EOM-I and MMM. Cardiovascular:  IRIR, Nl S1/S2, -M/R/G. Lungs:  CTA bilaterally. Abdomen:  Soft, non tender, ND and +BS. Musculoskeletal:  1+ edema Skin:  No rashes  LABS:  BMET  Recent Labs Lab 11/05/15 0639 11/05/15 0729 11/06/15 0548  NA 142 142 143  K 3.6 3.5 3.4*  CL 106 104 114*  CO2 20*  --  22  BUN 18 23* 13  CREATININE 2.30* 2.10* 1.96*  GLUCOSE 194* 188* 102*    Electrolytes  Recent Labs Lab 11/05/15 0639 11/06/15 0548  CALCIUM 9.0 8.5*    CBC  Recent Labs Lab 11/05/15 0639 11/05/15 0729 11/05/15 0844 11/06/15 0548  WBC 11.9*  --  12.7* 8.8  HGB 10.5* 12.6* 9.7* 8.2*  HCT 33.6* 37.0* 31.8* 26.6*  PLT 540*  --  517* 436*    Coag's  Recent Labs Lab 11/05/15 2325 11/06/15 0548 11/06/15 1120  INR 1.31 1.25 1.22    Sepsis Markers  Recent Labs Lab 11/05/15 0811 11/05/15 0858 11/05/15 1108  LATICACIDVEN 6.15* 4.86* 4.92*   ABG  Recent Labs Lab 11/05/15 0809 11/05/15 1625 11/06/15 0530  PHART 7.477* 7.551* 7.540*  PCO2ART 23.2* 23.4* 22.5*  PO2ART 88.0 134* 111*   Liver Enzymes  Recent Labs Lab 11/05/15 0639  AST 41  ALT 48  ALKPHOS 211*  BILITOT 0.4  ALBUMIN 3.3*    Cardiac Enzymes No results for input(s): TROPONINI, PROBNP in the last 168 hours.  Glucose  Recent Labs Lab 11/05/15 0733  GLUCAP 142*    Imaging Ct Head Wo Contrast  11/06/2015  CLINICAL DATA:  Follow-up. History of hypertension, chronic kidney disease and stroke. EXAM: CT HEAD WITHOUT CONTRAST TECHNIQUE: Contiguous axial images were obtained from the base of the skull through the vertex without intravenous contrast. COMPARISON:  CT head November 05, 2015 FINDINGS: 3.8 x 2.7 cm evolving LEFT frontoparietal hematoma with intraventricular extension. Blood products distend the LEFT temporal horn with transependymal flow cerebral spinal fluid. Additional  blood products in the RIGHT lateral ventricle, third and fourth ventricle. Interval placement of RIGHT frontal ventriculostomy catheter with distal tip at the level of the foramen of Monro. The anterior recess of the third ventricle was 2 cm, now 1.2 cm. 7 mm LEFT-to-RIGHT midline shift increased from prior examination, with new small amount of extra-axial pneumocephalus on the RIGHT. Basal cisterns are patent. Moderate calcific atherosclerosis of the carotid siphons. Ocular globes and orbital contents are normal. Minimal paranasal sinus mucosal thickening without air-fluid levels. The mastoid air cells are well aerated. No skull fracture. New RIGHT frontal burr hole. IMPRESSION: Evolving LEFT intraparenchymal hematoma with intraventricular extension. Interval placement of RIGHT frontal ventriculostomy catheter with resolving hydrocephalus, however there is persistent LEFT temporal horn entrapment. 7 mm LEFT-to-RIGHT midline shift is likely postprocedural. New small amount of RIGHT frontal extra-axial pneumocephalus. Electronically Signed   By: Awilda Metro M.D.   On: 11/06/2015 05:40   Dg Chest [facility]  11/06/2015  CLINICAL DATA:  Respiratory failure. EXAM: PORTABLE CHEST 1 VIEW COMPARISON:  11/05/2015 . FINDINGS: Endotracheal tube, left IJ line, NG tube in stable position. Prior cardiac valve replacement. Heart size stable. No pulmonary vascular congestion on today's exam. Improving left lower lobe infiltrate/edema. Low lung volumes. Small left pleural effusion cannot be excluded. No pneumothorax. Prior cervical spine fusion. IMPRESSION: 1. Lines and tubes in stable position. 2. Heart size stable. No pulmonary venous congestion on today's exam. 3. Persistent but improving left lower lobe infiltrate/edema. Low lung volumes. Small left pleural effusion cannot be excluded. Electronically Signed   By: Maisie Fushomas  Register   On: 11/06/2015 07:23   Dg Chest Port 1 View  11/05/2015  CLINICAL DATA:  Left IJ  central venous catheter. EXAM: PORTABLE CHEST 1 VIEW COMPARISON:  11/05/2015 at 10:49 a.m. FINDINGS: Enteric tube is coiled once over the stomach with tip over the mid gastric body. Endotracheal tube unchanged with tip approximately 4.2 cm above the carina. There has been interval placement of a left IJ central venous catheter with tip obliquely warranted over the SVC just above the level of the carina. Patient is slightly rotated to the right. Lungs are hypoinflated with continued mild prominence of the perihilar markings suggesting minimal vascular congestion. No effusion or pneumothorax. Cardiomediastinal silhouette and remainder of the exam is unchanged. IMPRESSION: Hypoinflation with findings suggesting mild vascular congestion. Tubes and lines as described. Interval placement left IJ central venous catheter with tip over the SVC. No pneumothorax. Electronically Signed   By: Elberta Fortisaniel  Boyle M.D.   On: 11/05/2015 13:46     STUDIES:  1/15 CT head >> acute parenchymal hemorrhage abutting the atria of the left lateral ventricle measuring approximate 1.6 x 2.6 cm, extends into ventricular system  CULTURES: 1/15 Blood >> 1/15 Urine >>  ANTIBIOTICS: 1/15 Vancomycin >> 1/15 1/15 Zosyn >> 1/15  SIGNIFICANT EVENTS: 1/15 Admit, [facility], neurosurgery consulted  LINES/TUBES: 1/15 ETT >> 1/15 IVD >> 1/15 CVL >>   DISCUSSION: 70 yo male former smoker with N/V, altered mental status with ICH and IVH.  Had recent CABG and A fib, and was on coumadin as outpt.  Also has hx of HTN, Bipolar, OSA, Stage 3 CKD.  ASSESSMENT / PLAN:  NEUROLOGIC A:   Acute encephalopathy 2nd to ICH, IVH. P:   RASS goal: -1. EVD per neurosurgery. Hold propofol for potential extubation today. Avoid sedatives that are renally excreted.  PULMONARY A: Compromised airway in setting of ICH. Hx of OSA. P:   PS trials as able. Would like to see patient more alert prior to extubation. F/u CXR,  ABG.  CARDIOVASCULAR A:  Hx of CAD s/p CABG. Hx of A fib on coumadin as outpt. Hx of HTN, HLD. P:  Continue amiodarone, lipitor. No ASA, coumadin. Goal SBP < 140 per neurology.  RENAL A:   CKD stage 3. Lactic acidosis in setting of ICH. P:   Monitor renal fx, urine outpt. KVO IVF. Replace electrolytes as indicated.  GASTROINTESTINAL A:   Nutrition. P:   TF per nutrition. Protonix for SUP  HEMATOLOGIC A:   Anemia of critical illness. Coagulopathy 2nd to coumadin. P:  F/u CBC, INR SCD for DVT prevention  INFECTIOUS A:   Absolutely no evidence of infection or sepsis. P:   D/c Abx   ENDOCRINE A:   Hyperglycemia. P:   SSI  Discussed with Dr. Pearlean BrownieSethi, patient is ready for extubation from their standpoint, would like to see patient more awake prior to consideration  of extubation however, will stop propofol and re-evaluate.  Wife updated bedside.  The patient is critically ill with multiple organ systems failure and requires high complexity decision making for assessment and support, frequent evaluation and titration of therapies, application of advanced monitoring technologies and extensive interpretation of multiple databases.   Critical Care Time devoted to patient care services described in this note is  35  Minutes. This time reflects time of care of this signee Dr Koren Bound. This critical care time does not reflect procedure time, or teaching time or supervisory time of PA/NP/Med student/Med Resident etc but could involve care discussion time.  Alyson Reedy, M.D. [facility] Pulmonary/Critical Care Medicine. Pager: [phone]. After hours pager: [phone].

## 2015-11-06 NOTE — Progress Notes (Signed)
SLP Cancellation Note  Patient Details Name: Marcelo BaldyLarry W Folsom MRN: [id number] DOB: 06/23/1946   Cancelled treatment:       Reason Eval/Treat Not Completed: Medical issues which prohibited therapy   Lynita LombardLauren Nadalee Neiswender 11/06/2015, 7:45 AM  Lynita LombardLauren Opie Maclaughlin, Student-SLP

## 2015-11-06 NOTE — Progress Notes (Signed)
OT Cancellation Note  Patient Details Name: Jordan [name] MRN: [id number] DOB: 10-16-1946   Cancelled Treatment:    Reason Eval/Treat Not Completed: Patient not medically ready Pt on bedrest. Pt update activity orders when appropriate for therapy. thanks [facility]ILLARY  Bern Fare, OTR/L  [phone] 11/06/2015 11/06/2015, 8:24 AM

## 2015-11-06 NOTE — Progress Notes (Signed)
RT NOTE:  Pt transported to CT without event.  

## 2015-11-06 NOTE — Progress Notes (Signed)
PT Cancellation Note  Patient Details Name: Marcelo BaldyLarry W Funderburk MRN: [id number] DOB: 01-08-1946   Cancelled Treatment:    Reason Eval/Treat Not Completed: Patient not medically ready.  Pt currently on bedrest.  Please advance activity once appropriate for PT and mobility.     Sunny SchleinRitenour, Leea Rambeau F, [place] CarolinaPT [phone] 11/06/2015, 8:23 AM

## 2015-11-06 NOTE — Progress Notes (Signed)
Reported to MD Vassie LollAlva that patient pulled out ET tube partially. RT in to assess and was able to advance tube. MD aware. Orders received for L wrist restraint. Chest xray ordered to confirm placement.

## 2015-11-07 ENCOUNTER — Inpatient Hospital Stay (HOSPITAL_COMMUNITY): Payer: PPO

## 2015-11-07 DIAGNOSIS — G9340 Encephalopathy, unspecified: Secondary | ICD-10-CM

## 2015-11-07 LAB — PHOSPHORUS: PHOSPHORUS: 4.1 mg/dL (ref 2.5–4.6)

## 2015-11-07 LAB — BLOOD GAS, ARTERIAL
ACID-BASE DEFICIT: 0.7 mmol/L (ref 0.0–2.0)
ACID-BASE DEFICIT: 0.8 mmol/L (ref 0.0–2.0)
BICARBONATE: 22.2 meq/L (ref 20.0–24.0)
Bicarbonate: 21.3 mEq/L (ref 20.0–24.0)
DRAWN BY: 40415
Drawn by: 345601
FIO2: 0.4
FIO2: 0.4
LHR: 14 {breaths}/min
MECHVT: 570 mL
O2 SAT: 99.3 %
O2 SAT: 98.9 %
PEEP/CPAP: 5 cmH2O
PEEP/CPAP: 5 cmH2O
PH ART: 7.485 — AB (ref 7.350–7.450)
Patient temperature: 98
Patient temperature: 98.6
RATE: 26 resp/min
TCO2: 22 mmol/L (ref 0–100)
TCO2: 23.2 mmol/L (ref 0–100)
VT: 600 mL
pCO2 arterial: 22.7 mmHg — ABNORMAL LOW (ref 35.0–45.0)
pCO2 arterial: 29.9 mmHg — ABNORMAL LOW (ref 35.0–45.0)
pH, Arterial: 7.577 — ABNORMAL HIGH (ref 7.350–7.450)
pO2, Arterial: 127 mmHg — ABNORMAL HIGH (ref 80.0–100.0)
pO2, Arterial: 125 mmHg — ABNORMAL HIGH (ref 80.0–100.0)

## 2015-11-07 LAB — BASIC METABOLIC PANEL
ANION GAP: 13 (ref 5–15)
BUN: 14 mg/dL (ref 6–20)
CALCIUM: 8.8 mg/dL — AB (ref 8.9–10.3)
CO2: 24 mmol/L (ref 22–32)
CREATININE: 1.84 mg/dL — AB (ref 0.61–1.24)
Chloride: 107 mmol/L (ref 101–111)
GFR, EST AFRICAN AMERICAN: 41 mL/min — AB (ref >60)
GFR, EST NON AFRICAN AMERICAN: 35 mL/min — AB (ref >60)
Glucose, Bld: 104 mg/dL — ABNORMAL HIGH (ref 65–99)
Potassium: 2.8 mmol/L — ABNORMAL LOW (ref 3.5–5.1)
Sodium: 144 mmol/L (ref 135–145)

## 2015-11-07 LAB — GLUCOSE, CAPILLARY
Glucose-Capillary: 95 mg/dL (ref 65–99)
Glucose-Capillary: 100 mg/dL — ABNORMAL HIGH (ref 65–99)

## 2015-11-07 LAB — CBC
HCT: 27.9 % — ABNORMAL LOW (ref 39.0–52.0)
Hemoglobin: 8.5 g/dL — ABNORMAL LOW (ref 13.0–17.0)
MCH: 26.1 pg (ref 26.0–34.0)
MCHC: 30.5 g/dL (ref 30.0–36.0)
MCV: 85.6 fL (ref 78.0–100.0)
PLATELETS: 428 10*3/uL — AB (ref 150–400)
RBC: 3.26 MIL/uL — ABNORMAL LOW (ref 4.22–5.81)
RDW: 14.9 % (ref 11.5–15.5)
WBC: 10 10*3/uL (ref 4.0–10.5)

## 2015-11-07 LAB — PROTIME-INR
INR: 1.31 (ref 0.00–1.49)
PROTHROMBIN TIME: 16.5 s — AB (ref 11.6–15.2)

## 2015-11-07 LAB — MAGNESIUM: Magnesium: 2 mg/dL (ref 1.7–2.4)

## 2015-11-07 MED ORDER — CETYLPYRIDINIUM CHLORIDE 0.05 % MT LIQD
7.0000 mL | Freq: Two times a day (BID) | OROMUCOSAL | Status: DC
  Administered 2015-11-08 (×2): 7 mL via OROMUCOSAL

## 2015-11-07 MED ORDER — POTASSIUM CHLORIDE 10 MEQ/50ML IV SOLN
10.0000 meq | INTRAVENOUS | Status: AC
  Administered 2015-11-07 (×4): 10 meq via INTRAVENOUS
  Filled 2015-11-07 (×4): qty 50

## 2015-11-07 MED ORDER — POTASSIUM CHLORIDE 20 MEQ/15ML (10%) PO SOLN: 40.0000 meq | Freq: Three times a day (TID) | ORAL | Status: DC

## 2015-11-07 MED ORDER — CHLORHEXIDINE GLUCONATE 0.12 % MT SOLN
15.0000 mL | Freq: Two times a day (BID) | OROMUCOSAL | Status: DC
  Administered 2015-11-07 – 2015-11-09 (×3): 15 mL via OROMUCOSAL
  Filled 2015-11-07 (×3): qty 15

## 2015-11-07 MED ORDER — POTASSIUM CHLORIDE CRYS ER 20 MEQ PO TBCR
40.0000 meq | EXTENDED_RELEASE_TABLET | Freq: Three times a day (TID) | ORAL | Status: AC
  Administered 2015-11-07 (×2): 40 meq via ORAL
  Filled 2015-11-07 (×2): qty 2

## 2015-11-07 NOTE — Progress Notes (Signed)
SLP Cancellation Note  Patient Details Name: Jordan [name] MRN: [id number] DOB: 07-02-1946   Cancelled treatment:       Reason Eval/Treat Not Completed: Medical issues which prohibited therapy, pt remain intubated, will sign off. Please reorder when pt ready to participate.    Jayana Kotula, Riley Nearing 11/07/2015, 7:34 AM

## 2015-11-07 NOTE — Procedures (Signed)
Extubation Procedure Note  Patient Details:   Name: Jordan [name] DOB: 28-Nov-1945 MRN: [id number]   Airway Documentation:  Airway 7.5 mm (Active)  Secured at (cm) 24 cm 11/07/2015  7:22 AM  Measured From Lips 11/07/2015  7:22 AM  Secured Location Right 11/07/2015  7:22 AM  Secured By [employer] 11/07/2015  7:22 AM  Tube Holder Repositioned Yes 11/07/2015  7:22 AM  Cuff Pressure (cm H2O) 22 cm H2O 11/07/2015  7:22 AM  Site Condition Cool;Dry 11/07/2015  7:22 AM    Evaluation  O2 sats: stable throughout and currently acceptable Complications: No apparent complications Patient did tolerate procedure well. Bilateral Breath Sounds: Rhonchi Suctioning: Airway Yes   Prior to extubation: pt suctioned, positive cuff leak noted.  Post-extubation: pt able to cough to produce sputum, speak, no stridor noted.  Antoine Poche 11/07/2015, 9:52 AM

## 2015-11-07 NOTE — Progress Notes (Signed)
OT Cancellation Note  Patient Details Name: Jordan [name] MRN: [id number] DOB: [birth year]   Cancelled Treatment:    Reason Eval/Treat Not Completed: Other (comment);Patient not medically ready. Remains intubated with trial of extubation this am. Will attempt to see this pm if appropriate.   [facility] Arvind Mexicano, OTR/L  [phone] 11/07/2015 11/07/2015, 8:10 AM

## 2015-11-07 NOTE — Progress Notes (Signed)
Occupational Therapy Evaluation Patient Details Name: Jordan [name] MRN: [id number] DOB: 1946-08-09 Today's Date: 11/07/2015    History of Present Illness pt presents with L PArietal Intraparenchymal Hemmorhage with Intraventricular extension, Intubated 1/15-1/17, Cerebral Edema, and Hydrocephalus.  pt with hx of recent NSTEMI, CABG, MVR, and TVR.  pt PMH of HTN, Bipolar, Anxiety, CKD, and CAD.     Clinical Impression   Apparently pt recently D/C home from rehab after CABG surgery in December. Pt presents with significant deficits as listed below, requiring Max A + 2 with mobility and max A with ADL. Pt with excellent participation during therapy today.   Feel pt would benefit from rehab at [facility]. Unsure of caregiver support due to communication deficits. Will follow acutely to address established goals and maximize functional level of independence.   Follow Up Recommendations  CIR;Supervision/Assistance - 24 hour    Equipment Recommendations  3 in 1 bedside comode    Recommendations for Other Services Rehab consult     Precautions / Restrictions Precautions Precautions: Fall;Other (comment) (ventric (IVC) drain must be clamped prior to therapy) Restrictions Weight Bearing Restrictions: No      Mobility Bed Mobility Overal bed mobility: Needs Assistance Bed Mobility: Supine to Sit     Supine to sit: Max assist;+2 for physical assistance;HOB elevated     General bed mobility comments: pt does attempt to participate in bed mobility, but needs 2 person A throughout.    Transfers Overall transfer level: Needs assistance Equipment used: 2 person hand held assist Transfers: Sit to/from [facility] Sit to Stand: Max assist;+2 physical assistance Stand pivot transfers: Max assist;+2 physical assistance       General transfer comment: pt needs R LE blocked and facilitation for trunk/hip extension.  pt did attempt to take steps towards recliner when cued to do  so.      Balance Overall balance assessment: Needs assistance Sitting-balance support: No upper extremity supported;Feet supported Sitting balance-Leahy Scale: Fair     Standing balance support: During functional activity Standing balance-Leahy Scale: Poor                              ADL Overall ADL's : Needs assistance/impaired Eating/Feeding: NPO   Grooming: Moderate assistance Grooming Details (indicate cue type and reason): able to brush teeth with min A for suppot after set up with suction kit Upper Body Bathing: Sitting Upper Body Bathing Details (indicate cue type and reason): able to wash face adn BUE with Min A Lower Body Bathing: Moderate assistance;Sit to/from stand   Upper Body Dressing : Maximal assistance   Lower Body Dressing: Maximal assistance         Toileting - Clothing Manipulation Details (indicate cue type and reason): foley     Functional mobility during ADLs: Maximal assistance;+2 for physical assistance       Vision Additional Comments: will further assess   Perception Perception Comments: will further assess   Praxis Praxis Praxis tested?: Deficits Deficits: Perseveration;Ideomotor    Pertinent Vitals/Pain Pain Assessment: Faces Faces Pain Scale: Hurts a little bit Pain Location: grimacing Pain Descriptors / Indicators: Grimacing Pain Intervention(s): Limited activity within patient's tolerance;Monitored during session     Hand Dominance  (unsure. trying to use R weak hand)   Extremity/Trunk Assessment Upper Extremity Assessment Upper Extremity Assessment: Generalized weakness;RUE deficits/detail RUE Deficits / Details: using RUE spontaneously; out of synergy pattern; Able to hold washloth, but unable to bring to  face. Able to hold deoderant for brief period RUE Coordination: decreased fine motor;decreased gross motor   Lower Extremity Assessment Lower Extremity Assessment: Defer to PT evaluation RLE Deficits /  Details: pt able to actively move LE, but unable to maintain against gravity.  Sensation appears intact as pt visually attends to PT when PT touches pt's R LE and grimaces and withdrawals with painful stimuli.   RLE Coordination: decreased fine motor;decreased gross motor   Cervical / Trunk Assessment Cervical / Trunk Assessment: Kyphotic   Communication Communication Communication: Expressive difficulties (pt grunts and at end of session did appear to mouth words.)   Cognition Arousal/Alertness: Awake/alert Behavior During Therapy: Flat affect Overall Cognitive Status: Impaired/Different from baseline Area of Impairment: Attention;Following commands;Awareness   Current Attention Level: Selective   Following Commands: Follows one step commands inconsistently       General Comments: Pt does better with automatic tasks, i.e. bathing, brushing teeth. unable to mimic getures   General Comments       Exercises       Shoulder Instructions      Home Living Family/patient expects to be discharged to:: Inpatient rehab                                 Additional Comments: Per old chart pt lives alone, but has an ex-wife who is involved in care.        Prior Functioning/Environment          Comments: Independent prior to CABG in December, but unclear level of A needed since that admit.      OT Diagnosis: Generalized weakness;Cognitive deficits;Acute pain;Hemiplegia dominant side;Apraxia   OT Problem List: Decreased strength;Decreased range of motion;Decreased activity tolerance;Impaired balance (sitting and/or standing);Impaired vision/perception;Decreased coordination;Decreased cognition;Decreased safety awareness;Decreased knowledge of use of DME or AE;Impaired UE functional use;Pain   OT Treatment/Interventions: Self-care/ADL training;Therapeutic exercise;Neuromuscular education;Energy conservation;DME and/or AE instruction;Therapeutic activities;Cognitive  remediation/compensation;Visual/perceptual remediation/compensation;Patient/family education;Balance training    OT Goals(Current goals can be found in the care plan section) Acute Rehab OT Goals Patient Stated Goal: pt unable to state. OT Goal Formulation: Patient unable to participate in goal setting Time For Goal Achievement: 11/21/15 Potential to Achieve Goals: Good ADL Goals Pt Will Perform Grooming: with set-up;sitting;with supervision Pt Will Perform Upper Body Bathing: with set-up;with supervision;sitting Pt Will Perform Lower Body Bathing: bed level;with min assist Pt Will Transfer to Toilet: with min assist;with +2 assist;bedside commode;stand pivot transfer Additional ADL Goal #1: Use R hand as functional assist during ADL  OT Frequency: Min 2X/week   Barriers to D/C:            Co-evaluation PT/OT/SLP Co-Evaluation/Treatment: Yes Reason for Co-Treatment: Complexity of the patient's impairments (multi-system involvement);For patient/therapist safety PT goals addressed during session: Mobility/safety with mobility;Balance OT goals addressed during session: ADL's and self-care;Strengthening/ROM      End of Session Equipment Utilized During Treatment: Gait belt Nurse Communication: Mobility status  Activity Tolerance: Patient tolerated treatment well Patient left: in chair;with call bell/phone within reach;with chair alarm set   Time: 1353-1423 OT Time Calculation (min): 30 min Charges:  OT General Charges $OT Visit: 1 Procedure OT Evaluation $OT Eval High Complexity: 1 Procedure G-Codes:    Brianne Maina,HILLARY 11/22/2015, 4:01 PM   Maurie Boettcher, OTR/L  [phone] 2015/11/22

## 2015-11-07 NOTE — Evaluation (Signed)
Physical Therapy Evaluation Patient Details Name: KOHNER ORLICK MRN: [id number] DOB: January 23, 1946 Today's Date: 11/07/2015   History of Present Illness  pt presents with L PArietal Intraparenchymal Hemmorhage with Intraventricular extension, Intubated 1/15-1/17, Cerebral Edema, and Hydrocephalus.  pt with hx of recent NSTEMI, CABG, MVR, and TVR.  pt PMH of HTN, Bipolar, Anxiety, CKD, and CAD.    Clinical Impression  Pt without verbalizations throughout session, but did attempt to grunt and mouthed words at end of session.  Pt participated in mobility, but limited following of directions both verbal and gestural.  Pt extubated this am with VSS throughout session on RA.  Feel pt would make a great candidate for CIR at D/C to maximize independence prior to returning to home.  IVC clamped prior to session.      Follow Up Recommendations CIR    Equipment Recommendations  None recommended by PT    Recommendations for Other Services Rehab consult     Precautions / Restrictions Precautions Precautions: Fall Restrictions Weight Bearing Restrictions: No      Mobility  Bed Mobility Overal bed mobility: Needs Assistance Bed Mobility: Supine to Sit     Supine to sit: Max assist;+2 for physical assistance;HOB elevated     General bed mobility comments: pt does attempt to participate in bed mobility, but needs 2 person A throughout.    Transfers Overall transfer level: Needs assistance Equipment used: 2 person hand held assist Transfers: Sit to/from [employer] Sit to Stand: Max assist;+2 physical assistance Stand pivot transfers: Max assist;+2 physical assistance       General transfer comment: pt needs R LE blocked and facilitation for trunk/hip extension.  pt did attempt to take steps towards recliner when cued to do so.    Ambulation/Gait                Stairs            Wheelchair Mobility    Modified Rankin (Stroke Patients Only) Modified  Rankin (Stroke Patients Only) Pre-Morbid Rankin Score: Slight disability Modified Rankin: Severe disability     Balance Overall balance assessment: Needs assistance Sitting-balance support: No upper extremity supported;Feet supported Sitting balance-Leahy Scale: Fair     Standing balance support: During functional activity Standing balance-Leahy Scale: Poor                               Pertinent Vitals/Pain Pain Assessment: Faces Faces Pain Scale: Hurts a little bit Pain Location: Grimaced during movement, but unsure if from pain or from effort. Pain Descriptors / Indicators: Grimacing Pain Intervention(s): Monitored during session;Repositioned    Home Living Family/patient expects to be discharged to:: Inpatient rehab                 Additional Comments: Per old chart pt lives alone, but has an ex-wife who is involved in care.      Prior Function           Comments: Independent prior to CABG in December, but unclear level of A needed since that admit.       Hand Dominance        Extremity/Trunk Assessment   Upper Extremity Assessment: Defer to OT evaluation           Lower Extremity Assessment: Generalized weakness;RLE deficits/detail;Difficult to assess due to impaired cognition RLE Deficits / Details: pt able to actively move LE, but unable to maintain against gravity.  Sensation appears intact as pt visually attends to PT when PT touches pt's R LE and grimaces and withdrawals with painful stimuli.      Cervical / Trunk Assessment: Kyphotic  Communication   Communication:  (pt grunts and at end of session did appear to mouth words.)  Cognition Arousal/Alertness: Awake/alert Behavior During Therapy: Flat affect Overall Cognitive Status: Impaired/Different from baseline Area of Impairment: Attention;Following commands   Current Attention Level: Focused   Following Commands: Follows one step commands inconsistently       General  Comments: pt does not follow all verbal or gestural directions, but does attempt to follow some directions and participates in mobility.  pt without verbalizations throughout session, but did grunt at times and at end of session did appear to mouth words.      General Comments      Exercises        Assessment/Plan    PT Assessment Patient needs continued PT services  PT Diagnosis Difficulty walking;Generalized weakness;Altered mental status   PT Problem List Decreased strength;Decreased activity tolerance;Decreased balance;Decreased mobility;Decreased coordination;Decreased cognition;Decreased knowledge of use of DME;Decreased safety awareness  PT Treatment Interventions DME instruction;Gait training;Functional mobility training;Therapeutic activities;Therapeutic exercise;Balance training;Neuromuscular re-education;Cognitive remediation;Patient/family education   PT Goals (Current goals can be found in the Care Plan section) Acute Rehab PT Goals Patient Stated Goal: pt unable to state. PT Goal Formulation: Patient unable to participate in goal setting Time For Goal Achievement: 11/21/15 Potential to Achieve Goals: Good    Frequency Min 3X/week   Barriers to discharge        Co-evaluation PT/OT/SLP Co-Evaluation/Treatment: Yes Reason for Co-Treatment: Complexity of the patient's impairments (multi-system involvement);Necessary to address cognition/behavior during functional activity;For patient/therapist safety PT goals addressed during session: Mobility/safety with mobility;Balance         End of Session Equipment Utilized During Treatment: Gait belt Activity Tolerance: Patient tolerated treatment well Patient left: in chair;with call bell/phone within reach;with chair alarm set Nurse Communication: Mobility status         Time: 1610-9604 PT Time Calculation (min) (ACUTE ONLY): 29 min   Charges:   PT Evaluation $PT Eval High Complexity: 1 Procedure     PT G  CodesSunny Schlein, Merrill [phone] 11/07/2015, 3:10 PM

## 2015-11-07 NOTE — Progress Notes (Signed)
Pt was transported to CT and back to 3M06 with no complications.

## 2015-11-07 NOTE — Progress Notes (Signed)
STROKE TEAM PROGRESS NOTE   SUBJECTIVE (INTERVAL HISTORY) Patient awake, doing well per RN with plans for extubation today. Ventriculostomy draining well. Blood pressure adequately controlled. No new issues identified   OBJECTIVE Temp:  [97 F (36.1 C)-98.5 F (36.9 C)] 98.3 F (36.8 C) (01/17 0800) Pulse Rate:  [82-107] 101 (01/17 0900) Cardiac Rhythm:  [-] Normal sinus rhythm (01/17 0800) Resp:  [14-30] 30 (01/17 0900) BP: (99-152)/(73-96) 140/96 mmHg (01/17 0900) SpO2:  [93 %-100 %] 100 % (01/17 0900) FiO2 (%):  [40 %] 40 % (01/17 0800) Weight:  [94 kg (207 lb 3.7 oz)-96.4 kg (212 lb 8.4 oz)] 94 kg (207 lb 3.7 oz) (01/17 0500)  CBC:  Recent Labs Lab 11/05/15 0639  11/05/15 0844 11/06/15 0548 11/07/15 0539  WBC 11.9*  --  12.7* 8.8 10.0  NEUTROABS 9.1*  --  11.2*  --   --   HGB 10.5*  < > 9.7* 8.2* 8.5*  HCT 33.6*  < > 31.8* 26.6* 27.9*  MCV 87.0  --  85.9 85.0 85.6  PLT 540*  --  517* 436* 428*  < > = values in this interval not displayed.  Basic Metabolic Panel:   Recent Labs Lab 11/06/15 0548 11/07/15 0539  NA 143 144  K 3.4* 2.8*  CL 114* 107  CO2 22 24  GLUCOSE 102* 104*  BUN 13 14  CREATININE 1.96* 1.84*  CALCIUM 8.5* 8.8*  MG  --  2.0  PHOS  --  4.1    Lipid Panel:     Component Value Date/Time   TRIG 136 11/05/2015 1650   HgbA1c:  Lab Results  Component Value Date   HGBA1C 5.6 10/12/2015   Urine Drug Screen:     Component Value Date/Time   LABOPIA NONE DETECTED 11/05/2015 0751   COCAINSCRNUR NONE DETECTED 11/05/2015 0751   LABBENZ NONE DETECTED 11/05/2015 0751   AMPHETMU NONE DETECTED 11/05/2015 0751   THCU NONE DETECTED 11/05/2015 0751   LABBARB NONE DETECTED 11/05/2015 0751      IMAGING  Ct Head Wo Contrast 11/07/2015   Evolving LEFT cerebral hematoma with intraventricular extension. Stable position of RIGHT frontal ventriculostomy catheter with resolving hydrocephalus, decreasing LEFT ventricular entrapment. 5 mm LEFT-to-RIGHT  midline shift, improved.  11/06/2015   Evolving LEFT intraparenchymal hematoma with intraventricular extension. Interval placement of RIGHT frontal ventriculostomy catheter with resolving hydrocephalus, however there is persistent LEFT temporal horn entrapment. 7 mm LEFT-to-RIGHT midline shift is likely postprocedural. New small amount of RIGHT frontal extra-axial pneumocephalus.  11/05/2015  1. Increase in volume of hemorrhage within the LEFT lateral ventricle anomaly within the LEFT atria and to lesser degree the temporal horn. 2. Increase in parenchymal edema surrounding the LEFT lateral ventricle hemorrhage. 3. No midline shift.  Basilar cisterns are patent. 4. No change in volume of hemorrhage within the third ventricle and fourth ventricle and anterior ventricles.  11/05/2015   New acute parenchymal hemorrhage abutting the atria of the left lateral ventricle measuring approximate 1.6 x 2.6 cm with mild adjacent edema. Hemorrhage extends into the ventricular system as described. Chronic ischemic microvascular disease. Small old left-sided lacunar infarct.   Dg Chest Port 1 View 11/06/2015  1. Lines and tubes in stable position. 2. Heart size stable. No pulmonary venous congestion on today's exam. 3. Persistent but improving left lower lobe infiltrate/edema. Low lung volumes. Small left pleural effusion cannot be excluded.  11/05/2015  Hypoinflation with findings suggesting mild vascular congestion. Tubes and lines as described. Interval placement left IJ  central venous catheter with tip over the SVC. No pneumothorax.  11/05/2015   Endotracheal tube and nasogastric tube in appropriate position. Improved aeration with decreased bibasilar atelectasis.  11/05/2015   Hypo inflation with mild opacification of the medial left base which may be due to effusions/atelectasis although cannot exclude infection. Suggestion of minimal vascular congestion.    PHYSICAL EXAM Elderly Caucasian male who is intubated but not  sedated. . Afebrile. Head is nontraumatic. Neck is supple without bruit.    Cardiac exam no murmur or gallop. Lungs are clear to auscultation. Distal pulses are well felt. Neurological Exam :  Awake  and follows commands. Left gaze preference able to look to the right barely past midline. Blinks to threat on the left but not on the right. Pupils irregular but reactive. Fundi were not visualized. Vision acuity cannot be reliably tested. Mild right lower facial weakness. Tongue midline. Motor system exam antigravity and normal strength on the left side. Right hemiplegia with grade 1-2/5  strength on the right. Diminished on the right. Sensation appear diminished on the right. Right plantar upgoing left downgoing. Gait was not tested.   ASSESSMENT/PLAN Jordan [name] is a 70 y.o. male with history of hypertension, recent NSTEMI status post CABG in December, AFIB w/ RVR on coumadin presenting with confusion. CT show a periventricular hemorrhage with IVH.  Stroke:  left parietal intraparenchymal hemorrhage with intraventricular extension with increase in hemorrhage size since admission. Hydrocephalus. Cerebral edema. Hemorrhage secondary to warfarin related coagulopathy  Resultant  R HP  IVC insertion R frontal ([place] 1/15). Normal  Drainage. stable  CT Still with significant edema with 7mm shift  Repeat CT head stable, unchanged  CL placed yesterday for possible 3%, will leave today  HgbA1c 5.6 in Dec  SCDs for VTE prophylaxis Diet NPO time specified  aspirin 81 mg daily and warfarin daily prior to admission, now on No antithrombotic due to hemorrhage  Ongoing aggressive stroke risk factor management  Therapy recommendations:  pending   Disposition:  pending   Acute respiratory failure  intubated in the ED after neurologic worsening  Ok to extubate from stroke standpoint  CCM plans extubation today  Atrial Fibrillation  Home anticoagulation:  Warfarin  INR 2.81 on  admission  Rapid reversal protocol due to IVH  INR now 1.31  Consider aspirin after 1 week and NOAC after 1 month if hemorrhage remains stable  Hypertension  Stable, low at times  Hyperlipidemia  Home meds:  Lipitor 10  Resumed lipitor in hospital once able to swallow of feeding tube placed  Other Stroke Risk Factors  Advanced age  Former Cigarette smoker, quit smoking 32 years ago   Obesity, Body mass index is 30.59 kg/(m^2).   Coronary artery disease - NSTEMI s/p CABG 12/16  Obstructive sleep apnea  Other Active Problems  Bipolar d/o  Stage III chronic kidney disease, baseline creatinine 1.8  Hypokalemia, repleted. CCM following and addressing  Anemia of critical illness  Hospital day # 2  Jordan [name] [facility] St[facility] See Amion for Pager information 11/07/2015 9:40 AM  I have personally examined this patient, reviewed notes, independently viewed imaging studies, participated in medical decision making and plan of care. I have made any additions or clarifications directly to the above note. Agree with note above.  Continue conservative medical treatment for now. We will hold off on hypertonic saline unless he has neurological worsening. Discussed with wife at the bedside and answered questions. This patient is critically ill  and at significant risk of neurological worsening, death and care requires constant monitoring of vital signs, hemodynamics,respiratory and cardiac monitoring, extensive review of multiple databases, frequent neurological assessment, discussion with family, other specialists and medical decision making of high complexity.I have made any additions or clarifications directly to the above note.This critical care time does not reflect procedure time, or teaching time or supervisory time of PA/NP/Med Resident etc but could involve care discussion time.  I spent 30 minutes of neurocritical care time  in the care of  this patient.   Delia Heady, MD Medical Director [facility] Pager: [phone] 11/07/2015 4:12 PM        To contact Stroke Continuity provider, please refer to [email or web address]. After hours, contact General Neurology

## 2015-11-07 NOTE — Clinical Social Work Note (Signed)
Clinical Social Work Assessment  Patient Details  Name: Jordan [name] MRN: [id number] Date of Birth: [birth year]  Date of referral:  11/07/15               Reason for consult:  Facility Placement                Permission sought to share information with:  Facility Sport and exercise psychologist, Family Supports Permission granted to share information::  Yes, Verbal Permission Granted  Name::     Jordan [name], x wife (seperated)  Agency::  Health and safety inspector- agreeable to [facility] SNF search  Relationship::     Contact Information:     Housing/Transportation Living arrangements for the past 2 months:  [place], [place] of Information:  Other (Comment Required) Jordan [name]) Patient Interpreter Needed:  None Criminal Activity/Legal Involvement Pertinent to Current Situation/Hospitalization:  No - Comment as needed Significant Relationships:  Spouse, Adult Children Lives with:  Self Do you feel safe going back to the place where you live?  No Need for family participation in patient care:  Yes (Comment) (alert though disoriented x4)  Care giving concerns:  Wife, Jordan [name], at bedside adamant that patient will need SNF at time of discharge.  Frustrated with insurance denial last admission.  Does not wish for patient to return to [facility]   Social Worker assessment / plan:  CSW received consult for possible SNF placement.  CSW met with patient, patient's wife Jordan [name]) and patient's sister at bedside.  Family members expressed frustration regarding insurance denial during patient's last admission to [facility] at which time he had open heart surgery.  Family was pushing for SNF placement at that time, though patient's ambulations progressed well and insurance denied SNF placement.  Per wife, patient received Letter of Guarantee from [facility] for STR at [facility] where patient was discharged and stayed for 7 days.  Patient wife, Jordan [name] states patient did not receive any therapy while at  [facility] and did not progress prior to returning home.  Of note, patient was discharged from [facility] on 1/5 to [facility]; discharged home on 1/12 and readmitted to [facility] on 1/15.  Family hoping for SNF placement at [facility] at time of discharge.  CSW educated family on SNF referral process.  Patient is not medically stable enough to date to begin SNF referrals.  CSW explained that once patient has progressed enough to work with therapies, CSW will begin making appropriate referrals.  Family acknowledged understanding which was demonstrated through teach back.  Employment status:    Insurance information:    PT Recommendations:  Not assessed at this time Information / Referral to community resources:  [place]  Patient/Family's Response to care:  Family was agreeable to SNF placement.  Patient/Family's Understanding of and Emotional Response to Diagnosis, Current Treatment, and Prognosis:  Patient's understanding was unable to be assessed at this time- lacking orientation.  Family remains realistic regarding level of care needed at time of discharge.  Emotional Assessment Appearance:  Appears older than stated age Attitude/Demeanor/Rapport:  Unable to Assess (disoriented) Affect (typically observed):  Calm, Unable to Assess (disoriented x4) Orientation:  Fluctuating Orientation (Suspected and/or reported Sundowners) (Disoriented x4) Alcohol / Substance use:  Not Applicable Psych involvement (Current and /or in the community):  No (Comment)  Discharge Needs  Concerns to be addressed:  Care Coordination Readmission within the last 30 days:  Yes (discharged after heart surgery on 1/5 dc'd to [facility] SNF and readmitted 1/12) Current  discharge risk:  None Barriers to Discharge:  Continued Medical Work up, [employer]   Elvis Coil 11/07/2015, 11:32 AM

## 2015-11-07 NOTE — Progress Notes (Signed)
eLink Physician-Brief Progress Note Patient Name: Jordan [name] DOB: 1946/05/21 MRN: [id number]   Date of Service  11/07/2015  HPI/Events of Note  abg noted  eICU Interventions  reeduce rate, repeat abg     Intervention Category Major Interventions: Respiratory failure - evaluation and management  Nelda Bucks. 11/07/2015, 4:22 AM

## 2015-11-07 NOTE — Progress Notes (Signed)
PULMONARY / CRITICAL CARE MEDICINE   Name: Jordan [name] MRN: [id number] DOB: 15-Mar-1946    ADMISSION DATE:  11/05/2015 CONSULTATION DATE:  11/05/2015  REFERRING MD:  Dr. Amada Jupiter, Neurology  CHIEF COMPLAINT: Altered mental status  HISTORY OF PRESENT ILLNESS:   70 yo male s/p CABG in December 2016 presented to ER with altered mental status, nausea and vomiting.  He also has hx of A fid and has been on coumadin.  CT head in ER showed peri-ventricular hemorrhage with IVH.  He was seen by neurology.  He received rapid reversal protocol for anticoagulation.  Neurosurgery has been consulted.  He was intubated in the ER.  PAST MEDICAL HISTORY :  He  has a past medical history of Hypertension; Kidney stone; Bipolar 1 disorder (HCC); Diverticulitis; Anxiety; Arthritis; Sleep apnea; Chronic headaches; Stage III chronic kidney disease; Left inguinal hernia; Atherosclerosis; CAD (coronary artery disease); Hiatal hernia; Esophagitis versus Mallory-Weiss tear; and Antritis (stomach).  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  No Known Allergies  No current facility-administered medications on file prior to encounter.   Current Outpatient Prescriptions on File Prior to Encounter  Medication Sig  . amiodarone (PACERONE) 200 MG tablet Take 1 tablet (200 mg total) by mouth daily.  Marland Kitchen aspirin EC 81 MG EC tablet Take 1 tablet (81 mg total) by mouth daily.  Marland Kitchen atorvastatin (LIPITOR) 10 MG tablet Take 1 tablet (10 mg total) by mouth daily. (Patient taking differently: Take 10 mg by mouth daily at 6  PM. )  . ferrous sulfate 325 (65 FE) MG tablet Take 1 tablet (325 mg total) by mouth daily with breakfast. For one month then stop.  . furosemide (LASIX) 40 MG tablet Take 1 tablet (40 mg total) by mouth daily.  Marland Kitchen oxyCODONE (OXY IR/ROXICODONE) 5 MG immediate release tablet Take 1-2 tablets (5-10 mg total) by mouth every 4 (four) hours as needed for severe pain.  Marland Kitchen warfarin (COUMADIN) 2.5 MG tablet Take 1 tablet (2.5 mg total) by mouth daily. Or as directed. (Patient taking differently: Take 2.5 mg by mouth daily at 6 PM. Or as directed.)  . folic acid (FOLVITE) 1 MG tablet Take 1 tablet (1 mg total) by mouth daily. For one month then stop. (Patient not taking: Reported on 11/06/2015)  . guaiFENesin (MUCINEX) 600 MG 12 hr tablet Take 1 tablet (600 mg total) by mouth 2 (two) times daily. May stop after discharge from SNF. (Patient not taking: Reported on 11/06/2015)  . lansoprazole (PREVACID) 15 MG capsule Take 1 capsule (15 mg total) by mouth daily at 12 noon. (Patient not taking: Reported on 10/08/2015)  . [DISCONTINUED] buPROPion (WELLBUTRIN SR) 150 MG 12 hr tablet Take 450 mg by mouth daily.    FAMILY HISTORY:  His indicated that his mother is alive. He indicated that his father is deceased. He indicated that all of his three sisters are alive. He indicated that his brother is deceased.   SOCIAL HISTORY: He  reports that he quit smoking about 32 years ago. He has never used smokeless tobacco. He reports that he does not drink alcohol or use illicit drugs.  REVIEW OF SYSTEMS:   Unable to obtain.  SUBJECTIVE:   VITAL SIGNS: BP 140/96 mmHg  Pulse 101  Temp(Src) 98.3 F (36.8 C) (Axillary)  Resp 30  Ht 5\' 9"  (1.753 m)  Wt 94 kg (207 lb 3.7 oz)  BMI 30.59 kg/m2  SpO2 100%  HEMODYNAMICS:    VENTILATOR SETTINGS: Vent Mode:  [-] PSV;CPAP FiO2 (%):  [40 %] 40 % Set Rate:  [14 bmp-26 bmp] 14 bmp Vt Set:  [570 mL-600 mL] 600 mL PEEP:  [5 cmH20] 5 cmH20 Pressure Support:  [5 cmH20] 5  cmH20 Plateau Pressure:  [12 cmH20-19 cmH20] 12 cmH20  INTAKE / OUTPUT: I/O last 3 completed shifts: In: 732.4 [I.V.:532.4; NG/GT:200] Out: 4490 [Urine:4175; Drains:315]  PHYSICAL EXAMINATION: General:  Arousable and moves the left more than right. Neuro:  RASS -1, moving L>R but not to command. HEENT:  ETT in place, EVD noted, PERRL, EOM-I and MMM. Cardiovascular:  IRIR, Nl S1/S2, -M/R/G. Lungs:  CTA bilaterally. Abdomen:  Soft, non tender, ND and +BS. Musculoskeletal:  1+ edema Skin:  No rashes  LABS:  BMET  Recent Labs Lab 11/05/15 0639 11/05/15 0729 11/06/15 0548 11/07/15 0539  NA 142 142 143 144  K 3.6 3.5 3.4* 2.8*  CL 106 104 114* 107  CO2 20*  --  22 24  BUN 18 23* 13 14  CREATININE 2.30* 2.10* 1.96* 1.84*  GLUCOSE 194* 188* 102* 104*    Electrolytes  Recent Labs Lab 11/05/15 0639 11/06/15 0548 11/07/15 0539  CALCIUM 9.0 8.5* 8.8*  MG  --   --  2.0  PHOS  --   --  4.1    CBC  Recent Labs Lab 11/05/15 0844 11/06/15 0548 11/07/15 0539  WBC 12.7* 8.8 10.0  HGB 9.7* 8.2* 8.5*  HCT 31.8* 26.6* 27.9*  PLT 517* 436* 428*    Coag's  Recent Labs Lab 11/06/15 0548 11/06/15 1120 11/07/15 0539  INR 1.25 1.22 1.31    Sepsis Markers  Recent Labs Lab 11/05/15 0811 11/05/15 0858 11/05/15 1108  LATICACIDVEN 6.15* 4.86* 4.92*   ABG  Recent Labs Lab 11/06/15 0530 11/07/15 0350 11/07/15 0559  PHART 7.540* 7.577* 7.485*  PCO2ART 22.5* 22.7* 29.9*  PO2ART 111* 127* 125*   Liver Enzymes  Recent Labs Lab 11/05/15 0639  AST 41  ALT 48  ALKPHOS 211*  BILITOT 0.4  ALBUMIN 3.3*    Cardiac Enzymes No results for input(s): TROPONINI, PROBNP in the last 168 hours.  Glucose  Recent Labs Lab 11/05/15 0733 11/07/15 0331  GLUCAP 142* 95    Imaging Ct Head Wo Contrast  11/07/2015  CLINICAL DATA:  Followup cerebral hemorrhage/ stroke. History of hypertension. EXAM: CT HEAD WITHOUT CONTRAST TECHNIQUE: Contiguous axial images were  obtained from the base of the skull through the vertex without intravenous contrast. COMPARISON:  CT head November 06, 2015 FINDINGS: Evolving LEFT periatrial hematoma measuring 2.8 x 3.4 cm, previously 2.7 x 3.8 cm. Intraventricular extension, with diffuse intraventricular hemorrhage. Anterior recess of third ventricle is 10 mm, previously 12 mm. Lateral dimension LEFT temporal horn is 18 mm, previously 21 mm. RIGHT frontal ventriculostomy catheter with distal tip at the level of the foramen of Monro is/ anterior recess. Slightly decreased transependymal flow/ interstitial edema about the LEFT ventricle. 5 mm LEFT-to-RIGHT midline shift, previously 7 mm. Mild LEFT uncal herniation. Basal cisterns are patent. Trace residual RIGHT frontal extra-axial pneumocephalus. Moderate calcific atherosclerosis the carotid siphons. Ocular globes and orbital contents are normal. Paranasal sinuses and mastoid air cells are well aerated. No skull fracture. IMPRESSION: Evolving LEFT cerebral hematoma with intraventricular extension. Stable position  of RIGHT frontal ventriculostomy catheter with resolving hydrocephalus, decreasing LEFT ventricular entrapment. 5 mm LEFT-to-RIGHT midline shift, improved. Electronically Signed   By: Awilda Metro M.D.   On: 11/07/2015 04:59   Dg Chest Port 1 View  11/07/2015  CLINICAL DATA:  Intubation. EXAM: PORTABLE CHEST 1 VIEW COMPARISON:  11/06/2015 . FINDINGS: Endotracheal tube and NG tube in stable position. Prior CABG. Stable cardiomegaly. Low lung volumes with basilar atelectasis. Left lower lobe infiltrate noted. No pleural effusion or pneumothorax. Prior cervical spine fusion. IMPRESSION: 1. Lines and tubes in stable position. 2. Prior CABG.  Stable cardiomegaly.  No pulmonary venous congestion 3. Low volumes with persistent bibasilar atelectasis. Persistent left lower lobe infiltrate noted. Electronically Signed   By: Maisie Fus  Register   On: 11/07/2015 07:45   Dg Chest Port 1  View  11/06/2015  CLINICAL DATA:  Verify endotracheal tube placement EXAM: PORTABLE CHEST 1 VIEW COMPARISON:  11/06/2015 FINDINGS: Endotracheal tube tip 2 cm above the carina. NG tube crosses the gastroesophageal junction with side hole of the catheter just below the diaphragm. Replacement cardiac valve noted. Left internal jugular central line unchanged with tip over the superior vena cava. Bilateral hypoventilatory change. IMPRESSION: Endotracheal tube as described. Electronically Signed   By: Esperanza Heir M.D.   On: 11/06/2015 21:05   Dg Abd Portable 1v  11/06/2015  CLINICAL DATA:  Verify a OG tube placement EXAM: PORTABLE ABDOMEN - 1 VIEW COMPARISON:  None FINDINGS: Orogastric tube crosses the gastroesophageal junction with side hole of the catheter at the gastroesophageal junction and tip of the tube into the stomach by approximately 3-4 cm. Nonobstructive gas pattern. IMPRESSION: Orogastric tube just crosses into the stomach and should be advanced. Electronically Signed   By: Esperanza Heir M.D.   On: 11/06/2015 21:13     STUDIES:  1/15 CT head >> acute parenchymal hemorrhage abutting the atria of the left lateral ventricle measuring approximate 1.6 x 2.6 cm, extends into ventricular system  CULTURES: 1/15 Blood >> 1/15 Urine >>  ANTIBIOTICS: 1/15 Vancomycin >> 1/15 1/15 Zosyn >> 1/15  SIGNIFICANT EVENTS: 1/15 Admit, [facility], neurosurgery consulted  LINES/TUBES: 1/15 ETT >>1/17 1/15 IVD >> 1/15 CVL >>   DISCUSSION: 70 yo male former smoker with N/V, altered mental status with ICH and IVH.  Had recent CABG and A fib, and was on coumadin as outpt.  Also has hx of HTN, Bipolar, OSA, Stage 3 CKD.  ASSESSMENT / PLAN:  NEUROLOGIC A:   Acute encephalopathy 2nd to ICH, IVH. P:   RASS goal: -1. IVD per neurosurgery. D/C sedation. Avoid sedatives that are renally excreted.  PULMONARY A: Compromised airway in setting of ICH. Hx of OSA. P:   Extubate today. Titrate O2  for sats of 88-92%. IS per RT protocol. Monitor for airway protection.  CARDIOVASCULAR A:  Hx of CAD s/p CABG. Hx of A fib on coumadin as outpt. Hx of HTN, HLD. P:  Continue amiodarone, lipitor. No ASA, coumadin. Goal SBP < 140 per neurology.  RENAL A:   CKD stage 3. Lactic acidosis in setting of ICH. P:   Monitor renal fx, urine outpt. KVO IVF. Replace electrolytes as indicated. Hold further lasix at this point.  GASTROINTESTINAL A:   Nutrition. P:   Swallow evaluation. D/C TF for extubation. Protonix for SUP  HEMATOLOGIC A:   Anemia of critical illness. Coagulopathy 2nd to coumadin. P:  F/u CBC, INR SCD for DVT prevention  INFECTIOUS A:   Absolutely no evidence of infection or  sepsis. P:   D/ced Abx   ENDOCRINE A:   Hyperglycemia. P:   D/C CBGs and ISS.  The patient is critically ill with multiple organ systems failure and requires high complexity decision making for assessment and support, frequent evaluation and titration of therapies, application of advanced monitoring technologies and extensive interpretation of multiple databases.   Critical Care Time devoted to patient care services described in this note is  35  Minutes. This time reflects time of care of this signee Dr Koren Bound. This critical care time does not reflect procedure time, or teaching time or supervisory time of PA/NP/Med student/Med Resident etc but could involve care discussion time.  Alyson Reedy, M.D. [facility] Pulmonary/Critical Care Medicine. Pager: [phone]. After hours pager: [phone].

## 2015-11-07 NOTE — Progress Notes (Signed)
Patient ID: Jordan [name], male   DOB: 1946-03-05, 70 y.o.   MRN: [id number] Awake, following commands. ivc working well. Ct head pending

## 2015-11-07 NOTE — Care Management Note (Addendum)
Case Management Note  Patient Details  Name: ALEX LEAHY MRN: [id number] Date of Birth: Mar 20, 1946  Subjective/Objective:  Pt admitted on 11/05/15 with IPH.  PTA, pt resided at home alone.  He is active with [facility] for [facility] needs.  He has previously been to SNF for rehab, and pt/family were unhappy with this SNF.  Ex wife currently involved with care, discharge planning.                      Action/Plan: CSW to follow up with pt/family to discuss options for dc; PT/OT recommending CIR at this time.  Will follow progress.    Expected Discharge Date:                  Expected Discharge Plan:   IP Rehab.  In-House Referral:   CSW  Discharge planning Services   CM Referral  Post Acute Care Choice:    Choice offered to:     DME Arranged:    DME Agency:     HH Arranged:    HH Agency:     Status of Service:   In process, will continue to follow  Medicare Important Message Given:    Date Medicare IM Given:    Medicare IM give by:    Date Additional Medicare IM Given:    Additional Medicare Important Message give by:     If discussed at Long Length of Stay Meetings, dates discussed:    Additional Comments:  Quintella Baton, RN, BSN  Trauma/Neuro ICU Case Manager [phone]

## 2015-11-07 NOTE — Evaluation (Signed)
Clinical/Bedside Swallow Evaluation Patient Details  Name: Jordan [name] MRN: [id number] Date of Birth: 03-29-1946  Today's Date: 11/07/2015 Time: SLP Start Time (ACUTE ONLY): 1509 SLP Stop Time (ACUTE ONLY): 1528 SLP Time Calculation (min) (ACUTE ONLY): 19 min  Past Medical History:  Past Medical History  Diagnosis Date  . Hypertension   . Kidney stone   . Bipolar 1 disorder (HCC)   . Diverticulitis   . Anxiety   . Arthritis   . Sleep apnea   . Chronic headaches   . Stage III chronic kidney disease     Baseline creatinine 1.8  . Left inguinal hernia   . Atherosclerosis   . CAD (coronary artery disease)     a. 10/11/2015: total occlusion of OM1 and LCx, 50% stenosis in distal LM, 80% stenosis in LAD, 99% stenosis in RCA --> CABG recommended.  . Hiatal hernia   . 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 Antritis (stomach)    Past Surgical History:  Past Surgical History  Procedure Laterality Date  . Appendectomy      70 years old  . Partial colectomy    . Spine surgery  2013  . Anterior cervical decomp/discectomy fusion N/A 12/04/2012    Procedure: ANTERIOR CERVICAL DECOMPRESSION/DISCECTOMY FUSION 2 LEVELS;  Surgeon: Temple Pacini, MD;  Location: [facility] NEURO ORS;  Service: Neurosurgery;  Laterality: N/A;  Cervical five-six, six-seven anterior cervical decompression/discectomy fusion with allograft and plating  . Esophagogastroduodenoscopy N/A 05/06/2015    Procedure: ESOPHAGOGASTRODUODENOSCOPY (EGD);  Surgeon: Vida Rigger, MD;  Location: [facility];  Service: Endoscopy;  Laterality: N/A;  . Cardiac catheterization N/A 10/11/2015    Procedure: Right/Left Heart Cath and Coronary Angiography;  Surgeon: Lyn Records, MD;  Location: [facility] INVASIVE CV LAB;  Service: Cardiovascular;  Laterality: N/A;  . Coronary artery bypass graft N/A 10/13/2015    Procedure: CORONARY ARTERY BYPASS GRAFTING (CABG) x four using left internal mammary artery and left leg greater saphenous vein harvested  endoscopically;  Surgeon: [name], MD;  Location: [facility] OR;  Service: Open Heart Surgery;  Laterality: N/A;  . Tee without cardioversion N/A 10/13/2015    Procedure: TRANSESOPHAGEAL ECHOCARDIOGRAM (TEE);  Surgeon: [name], MD;  Location: [facility] OR;  Service: Open Heart Surgery;  Laterality: N/A;  . Mitral valve replacement N/A 10/13/2015    Procedure: MITRAL VALVE (MV) REPLACEMENT;  Surgeon: [name], MD;  Location: [facility] OR;  Service: Open Heart Surgery;  Laterality: N/A;  . Tricuspid valve replacement N/A 10/13/2015    Procedure: TRICUSPID VALVE REPAIR;  Surgeon: [name], MD;  Location: [facility] OR;  Service: Open Heart Surgery;  Laterality: N/A;   HPI:  70 y.o. male with history of hypertension, recent NSTEMI status post CABG in December, AFIB w/ RVR on coumadin presenting with confusion on 1/15. CT shows a left parietal intraparenchymal hemorrhage with intraventricular extension.  IVC right frontal 1/15.  CT 1/17 still with significant edema with 7 mm shift.  Intubated 1/15-1/17   Assessment / Plan / Recommendation Clinical Impression  Pt presents with s/s of dysphagia and risk for aspiration s/p two-day intubation and left intraparenchymal hemorrhage.  CN function is normal;  oral apraxia is present.  Pt with adequate oral manipulation of purees and liquids; swallow response is consistently followed by throat-clearing and mild cough.  Recommend pt remain NPO for today except meds crushed in puree and occasional boluses of pureed consistencies if he requests.  Dysphagia is likely acute and reversible secondary to  intubation.  D/W RN.  SLP will follow for improvements and readiness for diet advancement.  Pt will also benefit from speech/language evaluation - he is aphasic.      Aspiration Risk  Moderate aspiration risk    Diet Recommendation     Medication Administration: Crushed with puree    Other  Recommendations Oral Care Recommendations: Oral care QID   Follow up  Recommendations  Skilled Nursing facility    Frequency and Duration min 3x week  2 weeks       Prognosis Prognosis for Safe Diet Advancement: Good      Swallow Study   General Date of Onset: 11/05/15 HPI: 70 y.o. male with history of hypertension, recent NSTEMI status post CABG in December, AFIB w/ RVR on coumadin presenting with confusion on 1/15. CT shows a left parietal intraparenchymal hemorrhage with intraventricular extension.  IVC right frontal 1/15.  CT 1/17 still with significant edema with 7 mm shift.  Intubated 1/15-1/17 Type of Study: Bedside Swallow Evaluation Previous Swallow Assessment: no Diet Prior to this Study: NPO Temperature Spikes Noted: No Respiratory Status: Nasal cannula History of Recent Intubation: Yes Length of Intubations (days): 2 days Date extubated: 11/07/15 Behavior/Cognition: Alert;Cooperative (aphasic) Oral Cavity Assessment: Within Functional Limits Oral Care Completed by SLP: No Oral Cavity - Dentition: Adequate natural dentition Vision: Functional for self-feeding Self-Feeding Abilities: Needs assist Patient Positioning: Upright in chair Baseline Vocal Quality: Hoarse Volitional Cough: Cognitively unable to elicit Volitional Swallow: Able to elicit    Oral/Motor/Sensory Function Overall Oral Motor/Sensory Function: Within functional limits   Ice Chips Ice chips: Impaired Presentation: Spoon Pharyngeal Phase Impairments: Suspected delayed Swallow;Multiple swallows;Throat Clearing - Immediate   Thin Liquid Thin Liquid: Impaired Presentation: Spoon Pharyngeal  Phase Impairments: Multiple swallows;Suspected delayed Swallow;Throat Clearing - Immediate    Nectar Thick Nectar Thick Liquid: Not tested   Honey Thick Honey Thick Liquid: Impaired Presentation: Spoon Pharyngeal Phase Impairments: Suspected delayed Swallow;Multiple swallows;Cough - Immediate   Puree Puree: Impaired Presentation: Spoon Pharyngeal Phase Impairments: Suspected  delayed Swallow;Throat Clearing - Immediate   Solid  [name] L. Rent[place], [place] CCC/SLP Pager [phone]    Solid: Not tested        [name] 11/07/2015,3:50 PM

## 2015-11-08 DIAGNOSIS — I10 Essential (primary) hypertension: Secondary | ICD-10-CM

## 2015-11-08 LAB — BASIC METABOLIC PANEL
ANION GAP: 11 (ref 5–15)
BUN: 13 mg/dL (ref 6–20)
CO2: 24 mmol/L (ref 22–32)
Calcium: 9.1 mg/dL (ref 8.9–10.3)
Chloride: 112 mmol/L — ABNORMAL HIGH (ref 101–111)
Creatinine, Ser: 1.66 mg/dL — ABNORMAL HIGH (ref 0.61–1.24)
GFR calc Af Amer: 46 mL/min — ABNORMAL LOW (ref >60)
GFR, EST NON AFRICAN AMERICAN: 40 mL/min — AB (ref >60)
GLUCOSE: 111 mg/dL — AB (ref 65–99)
POTASSIUM: 3.7 mmol/L (ref 3.5–5.1)
Sodium: 147 mmol/L — ABNORMAL HIGH (ref 135–145)

## 2015-11-08 LAB — PROTIME-INR
INR: 1.16 (ref 0.00–1.49)
Prothrombin Time: 15 seconds (ref 11.6–15.2)

## 2015-11-08 LAB — CBC
HEMATOCRIT: 32 % — AB (ref 39.0–52.0)
Hemoglobin: 9.8 g/dL — ABNORMAL LOW (ref 13.0–17.0)
MCH: 26.6 pg (ref 26.0–34.0)
MCHC: 30.6 g/dL (ref 30.0–36.0)
MCV: 87 fL (ref 78.0–100.0)
PLATELETS: 452 10*3/uL — AB (ref 150–400)
RBC: 3.68 MIL/uL — AB (ref 4.22–5.81)
RDW: 15.1 % (ref 11.5–15.5)
WBC: 15 10*3/uL — AB (ref 4.0–10.5)

## 2015-11-08 LAB — MAGNESIUM: Magnesium: 2.2 mg/dL (ref 1.7–2.4)

## 2015-11-08 LAB — PHOSPHORUS: PHOSPHORUS: 3.4 mg/dL (ref 2.5–4.6)

## 2015-11-08 NOTE — Progress Notes (Signed)
Patient ID: Jordan [name], male   DOB: [birth year], 70 y.o.   MRN: [id number] Expressive aphasia. weakness right arm. ivc to drain at 10

## 2015-11-08 NOTE — Progress Notes (Signed)
Inpatient Rehabilitation  PT and OT are recommending IP Rehab.  At this time, we are recommending IP Rehab consult.  Please order if you are agreeable.    Weldon Picking PT Inpatient Rehab Admissions Coordinator Cell [phone] Office [phone]

## 2015-11-08 NOTE — Evaluation (Signed)
Speech Language Pathology Evaluation Patient Details Name: Jordan [name] MRN: [id number] DOB: 05/26/1946 Today's Date: 1/18[name]: 1610-1630 SLP Time Calculation (min) (ACUTE ONLY): 20 min  Problem List:  Patient Active Problem List   Diagnosis Date Noted  . ICH (intracerebral hemorrhage) (HCC) 11/05/2015  . Cerebral hemorrhage (HCC)   . Respiratory failure (HCC)   . Postoperative anemia due to acute blood loss 10/30/2015  . CAD (coronary artery disease)   . S/P CABG (coronary artery bypass graft)   . S/P CABG x 4   . Tricuspid valve regurgitation 10/13/2015  . ACS (acute coronary syndrome) (HCC)   . Acute congestive heart failure (HCC)   . Atrial fibrillation with RVR (HCC) 10/10/2015  . Demand ischemia (HCC) 10/09/2015  . Non-STEMI (non-ST elevated myocardial infarction) (HCC) 10/08/2015  . Acute diastolic (congestive) heart failure (HCC) 10/08/2015  . Stage III chronic kidney disease 05/06/2015  . Hiatal hernia 05/06/2015  . Cervical spondylosis with radiculopathy 12/04/2012  . Hyperlipidemia 11/21/2007  . Obstructive sleep apnea 11/21/2007  . Hypertensive heart disease with CHF (congestive heart failure) (HCC)    Past Medical History:  Past Medical History  Diagnosis Date  . Hypertension   . Kidney stone   . Bipolar 1 disorder (HCC)   . Diverticulitis   . Anxiety   . Arthritis   . Sleep apnea   . Chronic headaches   . Stage III chronic kidney disease     Baseline creatinine 1.8  . Left inguinal hernia   . Atherosclerosis   . CAD (coronary artery disease)     a. 10/11/2015: total occlusion of OM1 and LCx, 50% stenosis in distal LM, 80% stenosis in LAD, 99% stenosis in RCA --> CABG recommended.  . Hiatal hernia   . 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 Antritis (stomach)    Past Surgical History:  Past Surgical History  Procedure Laterality Date  . Appendectomy      70 years old  . Partial colectomy    . Spine surgery  2013  . Anterior cervical  decomp/discectomy fusion N/A 12/04/2012    Procedure: ANTERIOR CERVICAL DECOMPRESSION/DISCECTOMY FUSION 2 LEVELS;  Surgeon: Temple Pacini, MD;  Location: [facility]URO ORS;  Service: Neurosurgery;  Laterality: N/A;  Cervical five-six, six-seven anterior cervical decompression/discectomy fusion with allograft and plating  . Esophagogastroduodenoscopy N/A 05/06/2015    Procedure: ESOPHAGOGASTRODUODENOSCOPY (EGD);  Surgeon: Vida Rigger, MD;  Location: [facility];  Service: Endoscopy;  Laterality: N/A;  . Cardiac catheterization N/A 10/11/2015    Procedure: Right/Left Heart Cath and Coronary Angiography;  Surgeon: Lyn Records, MD;  Location: [facility] INVASIVE CV LAB;  Service: Cardiovascular;  Laterality: N/A;  . Coronary artery bypass graft N/A 10/13/2015    Procedure: CORONARY ARTERY BYPASS GRAFTING (CABG) x four using left internal mammary artery and left leg greater saphenous vein harvested endoscopically;  Surgeon: [name], MD;  Location: [facility] OR;  Service: Open Heart Surgery;  Laterality: N/A;  . Tee without cardioversion N/A 10/13/2015    Procedure: TRANSESOPHAGEAL ECHOCARDIOGRAM (TEE);  Surgeon: [name], MD;  Location: [facility] OR;  Service: Open Heart Surgery;  Laterality: N/A;  . Mitral valve replacement N/A 10/13/2015    Procedure: MITRAL VALVE (MV) REPLACEMENT;  Surgeon: [name], MD;  Location: [facility] OR;  Service: Open Heart Surgery;  Laterality: N/A;  . Tricuspid valve replacement N/A 10/13/2015    Procedure: TRICUSPID VALVE REPAIR;  Surgeon: [name], MD;  Location: [facility] OR;  Service: Open Heart  Surgery;  Laterality: N/A;   HPI:  70 y.o. male with history of hypertension, recent NSTEMI status post CABG in December, AFIB w/ RVR on coumadin presenting with confusion on 1/15. CT shows a left parietal intraparenchymal hemorrhage with intraventricular extension.  IVC right frontal 1/15.  CT 1/17 still with significant edema with 7 mm shift.  Intubated 1/15-1/17   Assessment / Plan /  Recommendation Clinical Impression  Pt presents with an aphasia and apraxia of speech marked by nonfluent output, limited spontaneity.  Efforts to speak marked by apraxic-like errors.  Comprehension is also impaired, to lesser degree, with preserved ability to follow simple, predictable one-step commands and answer yes/ no questions, but greater impairment with more novel communication.  Recommend intensive SLP f/u to address communication deficits.      SLP Assessment  Patient needs continued Speech Lanaguage Pathology Services    Follow Up Recommendations  Inpatient Rehab    Frequency and Duration min 3x week  2 weeks      SLP Evaluation Prior Functioning  Cognitive/Linguistic Baseline: Within functional limits Type of Home: House   Cognition  Overall Cognitive Status: Impaired/Different from baseline Arousal/Alertness: Awake/alert Orientation Level: Oriented to person;Disoriented to place;Disoriented to time;Disoriented to situation    Comprehension  Auditory Comprehension Overall Auditory Comprehension: Impaired Yes/No Questions: Impaired Basic Biographical Questions: 51-75% accurate Commands: Impaired One Step Basic Commands: 50-74% accurate Conversation: Simple Visual Recognition/Discrimination Discrimination: Exceptions to [facility] Common Objects: Able for objects in room (with choice of two) Reading Comprehension Reading Status: Impaired Word level: Impaired    Expression Expression Primary Mode of Expression: Verbal Verbal Expression Overall Verbal Expression: Impaired Initiation: Impaired Level of Generative/Spontaneous Verbalization: Word Repetition: Impaired Level of Impairment: Word level Naming: Impairment Responsive: Not tested Confrontation: Impaired Common Objects: Able for objects in room (with choice of two) Convergent: Not tested Verbal Errors: Semantic paraphasias;Phonemic paraphasias;Neologisms Written Expression Dominant Hand: Right Written  Expression: Not tested   Oral / Motor  Oral Motor/Sensory Function Overall Oral Motor/Sensory Function: Mild impairment Facial Symmetry: Abnormal symmetry right;Suspected CN VII (facial) dysfunction Motor Speech Overall Motor Speech: Impaired Articulation: Impaired Level of Impairment: Word Intelligibility: Intelligibility reduced Motor Planning: Impaired Level of Impairment: Word Motor Speech Errors: Groping for words;Inconsistent   GO                   Virga Haltiwanger L. Samson Frederic, [place] CCC/SLP Pager [phone]  [name] 11/08/2015, 5:33 PM

## 2015-11-08 NOTE — Progress Notes (Signed)
PULMONARY / CRITICAL CARE MEDICINE   Name: Jordan [name] MRN: [id number] DOB: 1945/10/27    ADMISSION DATE:  11/05/2015 CONSULTATION DATE:  11/05/2015  REFERRING MD:  Dr. Amada Jupiter, Neurology  CHIEF COMPLAINT: Altered mental status  HISTORY OF PRESENT ILLNESS:   70 yo male s/p CABG in December 2016 presented to ER with altered mental status, nausea and vomiting.  He also has hx of A fid and has been on coumadin.  CT head in ER showed peri-ventricular hemorrhage with IVH.  He was seen by neurology.  He received rapid reversal protocol for anticoagulation.  Neurosurgery has been consulted.  He was intubated in the ER.  SUBJECTIVE: No events overnight.  VITAL SIGNS: BP 139/102 mmHg  Pulse 102  Temp(Src) 99.9 F (37.7 C) (Oral)  Resp 23  Ht  (1.753 m)  Wt 94 kg (207 lb 3.7 oz)  BMI 30.59 kg/m2  SpO2 97%  HEMODYNAMICS:    VENTILATOR SETTINGS:    INTAKE / OUTPUT: I/O last 3 completed shifts: In: 1170.6 [I.V.:400.6; NG/GT:570; IV Piggyback:200] Out: 4098 [JXBJY:7829; Drains:258]  PHYSICAL EXAMINATION: General:  Arousable and moves the left more than right. Neuro:  Easily arousable but aphasic HEENT:  IVD in place, PERRL, EOM-I and MMM. Cardiovascular:  IRIR, Nl S1/S2, -M/R/G. Lungs:  CTA bilaterally. Abdomen:  Soft, non tender, ND and +BS. Musculoskeletal:  1+ edema Skin:  No rashes  LABS:  BMET  Recent Labs Lab 11/06/15 0548 11/07/15 0539 11/08/15 0530  NA 143 144 147*  K 3.4* 2.8* 3.7  CL 114* 107 112*  CO2 BUN CREATININE 1.96* 1.84* 1.66*  GLUCOSE 102* 104* 111*   Electrolytes  Recent Labs Lab 11/06/15 0548 11/07/15 0539 11/08/15 0530  CALCIUM 8.5* 8.8* 9.1  MG  --  2.0 2.2  PHOS  --  4.1 3.4   CBC  Recent Labs Lab 11/06/15 0548 11/07/15 0539 11/08/15 0530  WBC 8.8 10.0 15.0*  HGB 8.2* 8.5* 9.8*  HCT 26.6* 27.9* 32.0*  PLT 436* 428* 452*   Coag's  Recent Labs Lab 11/06/15 1120 11/07/15 0539  11/08/15 0530  INR 1.22 1.31 1.16   Sepsis Markers  Recent Labs Lab 11/05/15 0811 11/05/15 0858 11/05/15 1108  LATICACIDVEN 6.15* 4.86* 4.92*   ABG  Recent Labs Lab 11/06/15 0530 11/07/15 0350 11/07/15 0559  PHART 7.540* 7.577* 7.485*  PCO2ART 22.5* 22.7* 29.9*  PO2ART 111* 127* 125*   Liver Enzymes  Recent Labs Lab 11/05/15 0639  AST 41  ALT 48  ALKPHOS 211*  BILITOT 0.4  ALBUMIN 3.3*   Cardiac Enzymes No results for input(s): TROPONINI, PROBNP in the last 168 hours.  Glucose  Recent Labs Lab 11/05/15 0733 11/07/15 0331 11/07/15 0744  GLUCAP 142* 95 100*   Imaging I reviewed CXR myself, bibasilar atelectasis noted.  STUDIES:  1/15 CT head >> acute parenchymal hemorrhage abutting the atria of the left lateral ventricle measuring approximate 1.6 x 2.6 cm, extends into ventricular system  CULTURES: 1/15 Blood >> 1/15 Urine >>  ANTIBIOTICS: 1/15 Vancomycin >> 1/15 1/15 Zosyn >> 1/15  SIGNIFICANT EVENTS: 1/15 Admit, [facility], neurosurgery consulted  LINES/TUBES: 1/15 ETT >>1/17 1/15 IVD >> 1/15 CVL L IJ >>   DISCUSSION: 70 yo male former smoker with N/V, altered mental status with ICH and IVH.  Had recent CABG and A fib, and was on coumadin as outpt.  Also has hx of HTN, Bipolar, OSA, Stage 3 CKD.  ASSESSMENT / PLAN:  NEUROLOGIC A:   Acute encephalopathy 2nd to ICH, IVH. P:   D/C all sedation. IVD per neurosurgery. BP control per NS recommendations.  PULMONARY A: Compromised airway in setting of ICH. Hx of OSA. P:   Titrate O2 for sats of 88-92%. IS per RT protocol. Monitor for airway protection. Swallow evaluation.  CARDIOVASCULAR A:  Hx of CAD s/p CABG. Hx of A fib on coumadin as outpt. Hx of HTN, HLD. P:  Continue amiodarone, lipitor. No ASA, coumadin. Goal SBP < 140 per neurology.  RENAL A:   CKD stage 3. Lactic acidosis in setting of ICH. P:   Monitor renal fx, urine outpt. KVO IVF. Replace electrolytes as  indicated. Hold further lasix at this point.  GASTROINTESTINAL A:   Nutrition. P:   Swallow evaluation. Protonix for SUP  HEMATOLOGIC A:   Anemia of critical illness. Coagulopathy 2nd to coumadin. P:  F/u CBC, INR SCD for DVT prevention  INFECTIOUS A:   Absolutely no evidence of infection or sepsis. P:   D/ced Abx   ENDOCRINE A:   Hyperglycemia. P:   D/C CBGs and ISS.  Discussed with bedside RN.  Alyson Reedy, M.D. [facility] Pulmonary/Critical Care Medicine. Pager: [phone]. After hours pager: [phone].

## 2015-11-08 NOTE — Progress Notes (Signed)
Speech Language Pathology Treatment: Dysphagia  Patient Details Name: Jordan [name] MRN: [id number] DOB: Jul 16, 1946 Today's Date: 11/08/2015 Time: 1600-1610 SLP Time Calculation (min) (ACUTE ONLY): 10 min  Assessment / Plan / Recommendation Clinical Impression  Pt demonstrating improved PO toleration today.  Consumed crackers, applesauce, and water with functional mastication, brisk swallow response, and overall adequate toleration until end of session, when thin liquids from straw began to elicit cough/throat clearing.  Recommend starting PO diet of dysphagia 3, thin liquids.  If pt struggles with thin liquid toleration this evening, downgrade to nectars.  D/W RN.    HPI HPI: 70 y.o. male with history of hypertension, recent NSTEMI status post CABG in December, AFIB w/ RVR on coumadin presenting with confusion on 1/15. CT shows a left parietal intraparenchymal hemorrhage with intraventricular extension.  IVC right frontal 1/15.  CT 1/17 still with significant edema with 7 mm shift.  Intubated 1/15-1/17      SLP Plan  Continue with current plan of care     Recommendations  Diet recommendations: Dysphagia 3 (mechanical soft);Thin liquid Liquids provided via: Cup;No straw Medication Administration: Whole meds with puree Supervision: Full supervision/cueing for compensatory strategies Compensations: Slow rate;Small sips/bites Postural Changes and/or Swallow Maneuvers: Seated upright 90 degrees;Upright 30-60 min after meal             Oral Care Recommendations: Oral care BID Plan: Continue with current plan of care     GO              Maryela Tapper L. Samson Frederic, [place] CCC/SLP Pager [phone]   [name] 11/08/2015, 4:30 PM

## 2015-11-08 NOTE — Progress Notes (Signed)
STROKE TEAM PROGRESS NOTE   SUBJECTIVE (INTERVAL HISTORY) Patient awake, and tolerated extubation yesterday.. Ventriculostomy draining well. Blood pressure adequately controlled. No new issues identified. CT head yesterday stable left cerebral hematoma with intraventricular extension with resolving hydrocephalus and decreased ventricular entrapment and midline shift   OBJECTIVE Temp:  [97.7 F (36.5 C)-100.4 F (38 C)] 100.4 F (38 C) (01/18 1100) Pulse Rate:  [95-111] 95 (01/18 1500) Cardiac Rhythm:  [-] Normal sinus rhythm (01/17 2000) Resp:  [13-38] 30 (01/18 1500) BP: (111-162)/(74-122) 129/77 mmHg (01/18 1500) SpO2:  [95 %-100 %] 100 % (01/18 1500)  CBC:  Recent Labs Lab 11/05/15 0639  11/05/15 0844  11/07/15 0539 11/08/15 0530  WBC 11.9*  --  12.7*  < > 10.0 15.0*  NEUTROABS 9.1*  --  11.2*  --   --   --   HGB 10.5*  < > 9.7*  < > 8.5* 9.8*  HCT 33.6*  < > 31.8*  < > 27.9* 32.0*  MCV 87.0  --  85.9  < > 85.6 87.0  PLT 540*  --  517*  < > 428* 452*  < > = values in this interval not displayed.  Basic Metabolic Panel:   Recent Labs Lab 11/07/15 0539 11/08/15 0530  NA 144 147*  K 2.8* 3.7  CL 107 112*  CO2 24 24  GLUCOSE 104* 111*  BUN 14 13  CREATININE 1.84* 1.66*  CALCIUM 8.8* 9.1  MG 2.0 2.2  PHOS 4.1 3.4    Lipid Panel:     Component Value Date/Time   TRIG 136 11/05/2015 1650   HgbA1c:  Lab Results  Component Value Date   HGBA1C 5.6 10/12/2015   Urine Drug Screen:     Component Value Date/Time   LABOPIA NONE DETECTED 11/05/2015 0751   COCAINSCRNUR NONE DETECTED 11/05/2015 0751   LABBENZ NONE DETECTED 11/05/2015 0751   AMPHETMU NONE DETECTED 11/05/2015 0751   THCU NONE DETECTED 11/05/2015 0751   LABBARB NONE DETECTED 11/05/2015 0751      IMAGING  Ct Head Wo Contrast 11/07/2015   Evolving LEFT cerebral hematoma with intraventricular extension. Stable position of RIGHT frontal ventriculostomy catheter with resolving hydrocephalus,  decreasing LEFT ventricular entrapment. 5 mm LEFT-to-RIGHT midline shift, improved.  11/06/2015   Evolving LEFT intraparenchymal hematoma with intraventricular extension. Interval placement of RIGHT frontal ventriculostomy catheter with resolving hydrocephalus, however there is persistent LEFT temporal horn entrapment. 7 mm LEFT-to-RIGHT midline shift is likely postprocedural. New small amount of RIGHT frontal extra-axial pneumocephalus.  11/05/2015  1. Increase in volume of hemorrhage within the LEFT lateral ventricle anomaly within the LEFT atria and to lesser degree the temporal horn. 2. Increase in parenchymal edema surrounding the LEFT lateral ventricle hemorrhage. 3. No midline shift.  Basilar cisterns are patent. 4. No change in volume of hemorrhage within the third ventricle and fourth ventricle and anterior ventricles.  11/05/2015   New acute parenchymal hemorrhage abutting the atria of the left lateral ventricle measuring approximate 1.6 x 2.6 cm with mild adjacent edema. Hemorrhage extends into the ventricular system as described. Chronic ischemic microvascular disease. Small old left-sided lacunar infarct.   Dg Chest Port 1 View 11/06/2015  1. Lines and tubes in stable position. 2. Heart size stable. No pulmonary venous congestion on today's exam. 3. Persistent but improving left lower lobe infiltrate/edema. Low lung volumes. Small left pleural effusion cannot be excluded.  11/05/2015  Hypoinflation with findings suggesting mild vascular congestion. Tubes and lines as described. Interval placement left IJ  central venous catheter with tip over the SVC. No pneumothorax.  11/05/2015   Endotracheal tube and nasogastric tube in appropriate position. Improved aeration with decreased bibasilar atelectasis.  11/05/2015   Hypo inflation with mild opacification of the medial left base which may be due to effusions/atelectasis although cannot exclude infection. Suggestion of minimal vascular congestion.     PHYSICAL EXAM Elderly Caucasian male   . Afebrile. Head is nontraumatic. Neck is supple without bruit.    Cardiac exam no murmur or gallop. Lungs are clear to auscultation. Distal pulses are well felt. Neurological Exam :  Awake  and follows commands. Severe dysarthria and difficulty understanding at times. Follows only occasional simple midline commands. Left gaze preference able to look to the right barely past midline. Blinks to threat on the left but not on the right. Pupils irregular but reactive. Fundi were not visualized. Vision acuity cannot be reliably tested. Mild right lower facial weakness. Tongue midline. Motor system exam antigravity and normal strength on the left side. Right hemiplegia with grade 1-2/5  strength on the right. Diminished on the right. Sensation appear diminished on the right. Right plantar upgoing left downgoing. Gait was not tested.   ASSESSMENT/PLAN Mr. BRAEDYN KAUK is a 70 y.o. male with history of hypertension, recent NSTEMI status post CABG in December, AFIB w/ RVR on coumadin presenting with confusion. CT show a periventricular hemorrhage with IVH.  Stroke:  left parietal intraparenchymal hemorrhage with intraventricular extension with increase in hemorrhage size since admission. Hydrocephalus. Cerebral edema. Hemorrhage secondary to warfarin related coagulopathy  Resultant  R HP  IVC insertion R frontal ([place] 1/15). Normal  Drainage. stable  CT Still with significant edema with 7mm shift  Repeat CT head stable, unchanged  CL placed yesterday for possible 3%, will leave today  HgbA1c 5.6 in Dec  SCDs for VTE prophylaxis Diet NPO time specified  aspirin 81 mg daily and warfarin daily prior to admission, now on No antithrombotic due to hemorrhage  Ongoing aggressive stroke risk factor management  Therapy recommendations:  pending   Disposition:  pending   Acute respiratory failure  intubated in the ED after neurologic worsening  Ok to  extubate from stroke standpoint  CCM plans extubation today  Atrial Fibrillation  Home anticoagulation:  Warfarin  INR 2.81 on admission  Rapid reversal protocol due to IVH  INR now 1.31  Consider aspirin after 1 week and NOAC after 1 month if hemorrhage remains stable  Hypertension  Stable, low at times  Hyperlipidemia  Home meds:  Lipitor 10  Resumed lipitor in hospital once able to swallow of feeding tube placed  Other Stroke Risk Factors  Advanced age  Former Cigarette smoker, quit smoking 32 years ago   Obesity, Body mass index is 30.59 kg/(m^2).   Coronary artery disease - NSTEMI s/p CABG 12/16  Obstructive sleep apnea  Other Active Problems  Bipolar d/o  Stage III chronic kidney disease, baseline creatinine 1.8  Hypokalemia, repleted. CCM following and addressing  Anemia of critical illness  Hospital day # 3  Chaka Jefferys  [facility] See Amion for Pager information 11/08/2015 4:18 PM  I have personally examined this patient, reviewed notes, independently viewed imaging studies, participated in medical decision making and plan of care. I have made any additions or clarifications directly to the above note. Agree with note above.  Continue conservative medical treatment for now. We will hold off on hypertonic saline unless he has neurological worsening.  No family at bedside  today This patient is critically ill and at significant risk of neurological worsening, death and care requires constant monitoring of vital signs, hemodynamics,respiratory and cardiac monitoring, extensive review of multiple databases, frequent neurological assessment, discussion with family, other specialists and medical decision making of high complexity.I have made any additions or clarifications directly to the above note.This critical care time does not reflect procedure time, or teaching time or supervisory time of PA/NP/Med Resident etc but could involve care  discussion time.  I spent 30 minutes of neurocritical care time  in the care of  this patient.   Delia Heady, MD Medical Director [facility] Pager: [phone] 11/08/2015 4:18 PM        To contact Stroke Continuity provider, please refer to [email or web address]. After hours, contact General Neurology

## 2015-11-08 NOTE — Progress Notes (Signed)
Rehab Admissions Coordinator Note:  Patient was screened by Clois Dupes for appropriateness for an Inpatient Acute Rehab Consult.  At this time, we are recommending Inpatient Rehab consult if wife would like to pursue admission at [facility] CIR rather than noted previous discussion with SW that she wanted [facility]. [employer] insurance would still have to approve any rehab venue. Please clarify pt/family preference for venue.  Clois Dupes 11/08/2015, 7:44 AM  I can be reached at [phone].

## 2015-11-09 ENCOUNTER — Encounter (HOSPITAL_COMMUNITY): Payer: Self-pay | Admitting: Radiology

## 2015-11-09 ENCOUNTER — Inpatient Hospital Stay (HOSPITAL_COMMUNITY): Payer: PPO

## 2015-11-09 DIAGNOSIS — I611 Nontraumatic intracerebral hemorrhage in hemisphere, cortical: Principal | ICD-10-CM

## 2015-11-09 DIAGNOSIS — R401 Stupor: Secondary | ICD-10-CM

## 2015-11-09 MED ORDER — ENOXAPARIN SODIUM 40 MG/0.4ML ~~LOC~~ SOLN
40.0000 mg | SUBCUTANEOUS | Status: DC
  Administered 2015-11-09 – 2015-11-17 (×9): 40 mg via SUBCUTANEOUS
  Filled 2015-11-09 (×9): qty 0.4

## 2015-11-09 MED ORDER — PANTOPRAZOLE SODIUM 40 MG PO PACK
40.0000 mg | PACK | Freq: Every day | ORAL | Status: DC
  Administered 2015-11-09 – 2015-11-11 (×2): 40 mg
  Filled 2015-11-09 (×3): qty 20

## 2015-11-09 NOTE — Progress Notes (Addendum)
PULMONARY / CRITICAL CARE MEDICINE   Name: Jordan [name] MRN: [id number] DOB: 04-06-1946    ADMISSION DATE:  11/05/2015 CONSULTATION DATE:  11/05/2015  REFERRING MD:  Dr. Amada Jupiter, Neurology  CHIEF COMPLAINT: Altered mental status  HISTORY OF PRESENT ILLNESS:   70 yo male s/p CABG in December 2016 presented to ER with altered mental status, nausea and vomiting.  He also has hx of A fib and has been on coumadin.  CT head in ER showed peri-ventricular hemorrhage with IVH.  He was seen by neurology.  He received rapid reversal protocol for anticoagulation.  Neurosurgery has been consulted.  He was intubated in the ER.  SUBJECTIVE: remains with drain, CT done  VITAL SIGNS: BP 133/97 mmHg  Pulse 104  Temp(Src) 98.2 F (36.8 C) (Oral)  Resp 34  Ht  (1.753 m)  Wt 90.1 kg (198 lb 10.2 oz)  BMI 29.32 kg/m2  SpO2 100%  HEMODYNAMICS:    VENTILATOR SETTINGS:    INTAKE / OUTPUT: I/O last 3 completed shifts: In: 990 [P.O.:640; I.V.:350] Out: 1225 [Urine:860; Drains:365]  PHYSICAL EXAMINATION: General:  Awake, sleepy int Neuro:  Easily arousable but aphasic HEENT:  IVD in place, PERRL Cardiovascular:  RRTS1/S2, -M/R/G. Lungs:  CTA bilaterally Abdomen:  Soft, non tender, ND and +BS. Musculoskeletal:  1+ edema Skin:  No rashes  LABS:  BMET  Recent Labs Lab 11/06/15 0548 11/07/15 0539 11/08/15 0530  NA 143 144 147*  K 3.4* 2.8* 3.7  CL 114* 107 112*  CO2 BUN CREATININE 1.96* 1.84* 1.66*  GLUCOSE 102* 104* 111*   Electrolytes  Recent Labs Lab 11/06/15 0548 11/07/15 0539 11/08/15 0530  CALCIUM 8.5* 8.8* 9.1  MG  --  2.0 2.2  PHOS  --  4.1 3.4   CBC  Recent Labs Lab 11/06/15 0548 11/07/15 0539 11/08/15 0530  WBC 8.8 10.0 15.0*  HGB 8.2* 8.5* 9.8*  HCT 26.6* 27.9* 32.0*  PLT 436* 428* 452*   Coag's  Recent Labs Lab 11/06/15 1120 11/07/15 0539 11/08/15 0530  INR 1.22 1.31 1.16   Sepsis Markers  Recent Labs Lab  11/05/15 0811 11/05/15 0858 11/05/15 1108  LATICACIDVEN 6.15* 4.86* 4.92*   ABG  Recent Labs Lab 11/06/15 0530 11/07/15 0350 11/07/15 0559  PHART 7.540* 7.577* 7.485*  PCO2ART 22.5* 22.7* 29.9*  PO2ART 111* 127* 125*   Liver Enzymes  Recent Labs Lab 11/05/15 0639  AST 41  ALT 48  ALKPHOS 211*  BILITOT 0.4  ALBUMIN 3.3*   Cardiac Enzymes No results for input(s): TROPONINI, PROBNP in the last 168 hours.  Glucose  Recent Labs Lab 11/05/15 0733 11/07/15 0331 11/07/15 0744  GLUCAP 142* 95 100*   Imaging I reviewed CXR myself, bibasilar atelectasis noted.  STUDIES:  1/15 CT head >> acute parenchymal hemorrhage abutting the atria of the left lateral ventricle measuring approximate 1.6 x 2.6 cm, extends into ventricular system  CULTURES: 1/15 Blood >> 1/15 Urine >>  ANTIBIOTICS: 1/15 Vancomycin >> 1/15 1/15 Zosyn >> 1/15  SIGNIFICANT EVENTS: 1/15 Admit, [facility], neurosurgery consulted, drain 1/17- extubated  LINES/TUBES: 1/15 ETT >>1/17 1/15 IVD >> 1/15 CVL L IJ >>   DISCUSSION: 70 yo male former smoker with N/V, altered mental status with ICH and IVH.  Had recent CABG and A fib, and was on coumadin as outpt.  Also has hx of HTN, Bipolar, OSA, Stage 3 CKD.  ASSESSMENT / PLAN:  NEUROLOGIC A:   Acute encephalopathy 2nd  to ICH, IVH. P:   PT IVD per neurosurgery, blood remainsin ventricle BP control per NS recommendations.  PULMONARY A: Compromised airway in setting of ICH. Hx of OSA. P:   Titrate O2 for sats of 88-92%. IS Swallow evaluation done  CARDIOVASCULAR A:  Hx of CAD s/p CABG. Hx of A fib on coumadin as outpt. Hx of HTN, HLD. P:  Continue amiodarone, lipitor. No ASA, coumadin. Goal SBP < 140 per neurology, likely can be more aggressive soon  RENAL A:   CKD stage 3, likely had acute on chronic renal failure Lactic acidosis in setting of ICH. P:   Would continued to hold lasix Allow some pos balance, follow trend  further Tolerate Na 150-155 with brain edema noted  GASTROINTESTINAL A:   Nutrition. P:   Swallow evaluation. Protonix for SUP  HEMATOLOGIC A:   Anemia of critical illness. Coagulopathy 2nd to coumadin. P:  F/u CBC, INR - wnl SCD for DVT prevention  INFECTIOUS A:   no evidence of infection or sepsis. P:   Wbc some hemoconcetration  ENDOCRINE A:   Hyperglycemia. P:   D/C CBGs and ISS.  Discussed with bedside RN. Will sign off call if needed   Mcarthur Rossetti. Tyson Alias, MD, FACP Pgr: [phone] [facility]

## 2015-11-09 NOTE — Consult Note (Signed)
Physical Medicine and Rehabilitation Consult   Reason for Consult:  Right sided weakness, aphasia, apraxia Referring Physician:  Dr. Leonie Man.    HPI: Jordan [name] is a 70 y.o. male with history of HTN, CKD, sleep apnea, bipolar disorder, CAD s/p CABG with MVR/TVR 10/13/15 and new onset of Afib -on coumadin.  He was ambulating without AD and was doing well till 11/05/15 when he was found to be confused and lethargic. he was admitted for work up and found to have acute parenchymal hemorrhage extending into ventricular system. Coumadin was resumed with Wandalee Ferdinand and he was intubated in ED due to decline in MS. Dr.Botero consulted for input and IVC placed in right frontal horn emergently.  Follow up CCT showed evolving left cerebral hematoma with decline in shift and he tolerated extubation on 01/17 but continues to have waxing and waning of MS with Kaussmal breathing. Patient with resultant right sided weakness, dysphagia likely due to intubation as well as aphasia with apraxia.   MD and rehab team recommends CIR for follow up therapy.   Lives in a double[place]wide on [place]. Was staying with wife for a period after surgery. He was ambulating without AD.   ROS    Past Medical History  Diagnosis Date  . Hypertension   . Kidney stone   . Bipolar 1 disorder ([place])   . Diverticulitis   . Anxiety   . Arthritis   . Sleep apnea   . Chronic headaches   . Stage III chronic kidney disease     Baseline creatinine 1.8  . Left inguinal hernia   . Atherosclerosis   . CAD (coronary artery disease)     a. 10/11/2015: total occlusion of OM1 and LCx, 50% stenosis in distal LM, 80% stenosis in LAD, 99% stenosis in RCA --> CABG recommended.  . Hiatal hernia   . 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 Antritis (stomach)     Past Surgical History  Procedure Laterality Date  . Appendectomy      70 years old  . Partial colectomy    . Spine surgery  2013  . Anterior cervical decomp/discectomy  fusion N/A 12/04/2012    Procedure: ANTERIOR CERVICAL DECOMPRESSION/DISCECTOMY FUSION 2 LEVELS;  Surgeon: Charlie Pitter, MD;  Location: [facility];  Service: Neurosurgery;  Laterality: N/A;  Cervical five-six, six-seven anterior cervical decompression/discectomy fusion with allograft and plating  . Esophagogastroduodenoscopy N/A 05/06/2015    Procedure: ESOPHAGOGASTRODUODENOSCOPY (EGD);  Surgeon: Clarene Essex, MD;  Location: [facility];  Service: Endoscopy;  Laterality: N/A;  . Cardiac catheterization N/A 10/11/2015    Procedure: Right/Left Heart Cath and Coronary Angiography;  Surgeon: Belva Crome, MD;  Location: [facility];  Service: Cardiovascular;  Laterality: N/A;  . Coronary artery bypass graft N/A 10/13/2015    Procedure: CORONARY ARTERY BYPASS GRAFTING (CABG) x four using left internal mammary artery and left leg greater saphenous vein harvested endoscopically;  Surgeon: Gaye Pollack, MD;  Location: [facility];  Service: Open Heart Surgery;  Laterality: N/A;  . Tee without cardioversion N/A 10/13/2015    Procedure: TRANSESOPHAGEAL ECHOCARDIOGRAM (TEE);  Surgeon: Gaye Pollack, MD;  Location: [facility];  Service: Open Heart Surgery;  Laterality: N/A;  . Mitral valve replacement N/A 10/13/2015    Procedure: MITRAL VALVE (MV) REPLACEMENT;  Surgeon: Gaye Pollack, MD;  Location: [facility];  Service: Open Heart Surgery;  Laterality: N/A;  . Tricuspid valve replacement N/A 10/13/2015    Procedure:  TRICUSPID VALVE REPAIR;  Surgeon: Gaye Pollack, MD;  Location: [facility];  Service: Open Heart Surgery;  Laterality: N/A;    Family History  Problem Relation Age of Onset  . Anesthesia problems Neg Hx   . Heart attack Father   . Liver cancer Paternal Grandfather     unsure of other cancers   . Diabetes Sister     Social History:  Wendie Chess been separated for 4 years. Used to own a roofing business. Wife works and can check in intermittently. Per reports that he quit smoking about 32 years ago.  He has never used smokeless tobacco. Per  reports that he does not drink alcohol or use illicit drugs.    Allergies: No Known Allergies    Medications Prior to Admission  Medication Sig Dispense Refill  . amiodarone (PACERONE) 200 MG tablet Take 1 tablet (200 mg total) by mouth daily. 30 tablet 1  . aspirin EC 81 MG EC tablet Take 1 tablet (81 mg total) by mouth daily.    Marland Kitchen atorvastatin (LIPITOR) 10 MG tablet Take 1 tablet (10 mg total) by mouth daily. (Patient taking differently: Take 10 mg by mouth daily at 6 PM. ) 30 tablet 1  . ferrous sulfate 325 (65 FE) MG tablet Take 1 tablet (325 mg total) by mouth daily with breakfast. For one month then stop.  3  . furosemide (LASIX) 40 MG tablet Take 1 tablet (40 mg total) by mouth daily. 30 tablet 1  . oxyCODONE (OXY IR/ROXICODONE) 5 MG immediate release tablet Take 1-2 tablets (5-10 mg total) by mouth every 4 (four) hours as needed for severe pain. 30 tablet 0  . warfarin (COUMADIN) 2.5 MG tablet Take 1 tablet (2.5 mg total) by mouth daily. Or as directed. (Patient taking differently: Take 2.5 mg by mouth daily at 6 PM. Or as directed.) 30 tablet 1  . folic acid (FOLVITE) 1 MG tablet Take 1 tablet (1 mg total) by mouth daily. For one month then stop. (Patient not taking: Reported on 11/06/2015)    . guaiFENesin (MUCINEX) 600 MG 12 hr tablet Take 1 tablet (600 mg total) by mouth 2 (two) times daily. May stop after discharge from SNF. (Patient not taking: Reported on 11/06/2015)    . lansoprazole (PREVACID) 15 MG capsule Take 1 capsule (15 mg total) by mouth daily at 12 noon. (Patient not taking: Reported on 10/08/2015) 30 capsule 0    Home: Home Living Family/patient expects to be discharged to:: Inpatient rehab Type of Home: House Additional Comments: Per old chart pt lives alone, but has an ex-wife who is involved in care.    Functional History: Prior Function Comments: Independent prior to CABG in December, but unclear level of A needed since  that admit.   Functional Status:  Mobility: Bed Mobility Overal bed mobility: Needs Assistance Bed Mobility: Supine to Sit Supine to sit: Max assist, +2 for physical assistance, HOB elevated General bed mobility comments: pt does attempt to participate in bed mobility, but needs 2 person A throughout.   Transfers Overall transfer level: Needs assistance Equipment used: 2 person hand held assist Transfers: Sit to/from Stand, Stand Pivot Transfers Sit to Stand: Max assist, +2 physical assistance Stand pivot transfers: Max assist, +2 physical assistance General transfer comment: pt needs R LE blocked and facilitation for trunk/hip extension.  pt did attempt to take steps towards recliner when cued to do so.        ADL: ADL Overall ADL's : Needs assistance/impaired  Eating/Feeding: NPO Grooming: Moderate assistance Grooming Details (indicate cue type and reason): able to brush teeth with min A for suppot after set up with suction kit Upper Body Bathing: Sitting Upper Body Bathing Details (indicate cue type and reason): able to wash face adn BUE with Min A Lower Body Bathing: Moderate assistance, Sit to/from stand Upper Body Dressing : Maximal assistance Lower Body Dressing: Maximal assistance Toileting - Clothing Manipulation Details (indicate cue type and reason): foley Functional mobility during ADLs: Maximal assistance, +2 for physical assistance  Cognition: Cognition Overall Cognitive Status: Impaired/Different from baseline Arousal/Alertness: Awake/alert Orientation Level: Oriented to person, Disoriented to place, Disoriented to time, Disoriented to situation Cognition Arousal/Alertness: Awake/alert Behavior During Therapy: Flat affect Overall Cognitive Status: Impaired/Different from baseline Area of Impairment: Attention, Following commands, Awareness Current Attention Level: Selective Following Commands: Follows one step commands inconsistently General Comments: Pt does  better with automatic tasks, i.e. bathing, brushing teeth. unable to mimic getures  Blood pressure 103/82, pulse 102, temperature 98.2 F (36.8 C), temperature source Oral, resp. rate 28, height '5\' 9"'  (1.753 m), weight 90.1 kg (198 lb 10.2 oz), SpO2 98 %. Physical Exam  Nursing note and vitals reviewed. Constitutional: He appears well-developed and well-nourished. He appears lethargic. No distress. Nasal cannula in place.  Drain right frontal scalp with bloody drainage. Aroused minimally to sternal rubs. Limited exam due to lethargy.   Eyes:  Pupils round and reactive.  Cardiovascular: Normal rate and regular rhythm.   Respiratory: Effort normal. No respiratory distress. He has no wheezes. He exhibits no tenderness.  Mid line sternal incision healing well.  GI: Soft. Bowel sounds are normal. He exhibits no distension. There is no tenderness.  Neurological: He appears lethargic.  Grunted to sternal rubs. Moves Left side more volitionally.   Skin: Skin is dry. He is not diaphoretic.  Erythema left knee-non tender to touch.  Bilateral feet and left hand cool to touch.     No results found for this or any previous visit (from the past 24 hour(s)). Ct Head Wo Contrast  11/09/2015  CLINICAL DATA:  Followup intraventricular hemorrhage. EXAM: CT HEAD WITHOUT CONTRAST TECHNIQUE: Contiguous axial images were obtained from the base of the skull through the vertex without intravenous contrast. COMPARISON:  CT head November 07, 2015 FINDINGS: Evolving LEFT temporal parietal 3.1 x 3.1 cm intraparenchymal hematoma and surrounding vasogenic edema, relatively unchanged in appearance with intraventricular extension. Stable position of RIGHT frontal ventriculostomy catheter with distal tip at the foramen of Monro. Anterior recess of the third ventricle is 10 mm, relatively unchanged. Decreasing LEFT ventricular entrapment, previously 18 mm, now 14 mm. Small amount of RIGHT temporal and RIGHT frontal horn  pneumocephalus. Persistent LEFT periventricular transependymal flow cerebral spinal fluid. Similar 5 mm LEFT-to-RIGHT midline shift. Mild LEFT uncal herniation. No acute large vascular territory infarct. Ocular globes and orbital contents are normal. Trace paranasal sinus mucosal thickening without air-fluid levels. The mastoid air cells are well aerated. RIGHT frontal burr hole, no skull fracture. IMPRESSION: Evolving LEFT cerebral intraparenchymal hematoma with intraventricular extension. Stable position of RIGHT frontal ventriculostomy catheter, similar mild hydrocephalus, decreasing LEFT ventricular entrapment. Similar 5 mm LEFT-to-RIGHT midline shift, and mild LEFT uncal herniation. Electronically Signed   By: Elon Alas M.D.   On: 11/09/2015 03:27    Assessment/Plan: Diagnosis: large intraparenchymal hemorrhage with intraventricular extension  1. Does the need for close, 24 hr/day medical supervision in concert with the patient's rehab needs make it unreasonable for this patient to be served in  a less intensive setting? Yes and Potentially 2. Co-Morbidities requiring supervision/potential complications: CAD s/p CABG x4 3. Due to bladder management, bowel management, safety, skin/wound care, disease management, medication administration, pain management and patient education, does the patient require 24 hr/day rehab nursing? Yes and Potentially 4. Does the patient require coordinated care of a physician, rehab nurse, PT (1-2 hrs/day, 5 days/week), OT (1-2 hrs/day, 5 days/week) and SLP (1-2 hrs/day, 5 days/week) to address physical and functional deficits in the context of the above medical diagnosis(es)? Yes Addressing deficits in the following areas: balance, endurance, locomotion, strength, transferring, bowel/bladder control, bathing, dressing, feeding, grooming, toileting, cognition, speech, language, swallowing and psychosocial support 5. Can the patient actively participate in an  intensive therapy program of at least 3 hrs of therapy per day at least 5 days per week? Potentially 6. The potential for patient to make measurable gains while on inpatient rehab is good and fair 7. Anticipated functional outcomes upon discharge from inpatient rehab are mod assist  with PT, mod assist with OT, min assist and mod assist with SLP. 8. Estimated rehab length of stay to reach the above functional goals is: potentially 13-20 days 9. Does the patient have adequate social supports and living environment to accommodate these discharge functional goals? see below 10. Anticipated D/C setting: Home 11. Anticipated post D/C treatments: [place] therapy 12. Overall Rehab/Functional Prognosis: good and fair  RECOMMENDATIONS: This patient's condition is appropriate for continued rehabilitative care in the following setting: see below Patient has agreed to participate in recommended program. N/A Note that insurance prior authorization may be required for reimbursement for recommended care.  Comment: Pt likely to have substantial rehab and medical needs once stable from a neurosurgical standpoint. He does lack caregivers at home, however. Assuming that he is able to tolerate inpatient rehab therapy intensity, it would likely be very valuable for him to move to inpatient rehab to lessen the burden of care at the next venue. Otherwise, I see him as very high risk for immediate medical complication if he were transferred directly to SNF from the hospital. Rehab Admissions Coordinator to follow up.  Thanks,  Meredith Staggers, MD, Mellody Drown     11/09/2015

## 2015-11-09 NOTE — Progress Notes (Signed)
Physical Therapy Treatment Patient Details Name: Jordan [name] MRN: [id number] DOB: 1946-01-10 Today's Date: 11/09/2015    History of Present Illness pt presents with L PArietal Intraparenchymal Hemmorhage with Intraventricular extension, Intubated 1/15-1/17, Cerebral Edema, and Hydrocephalus.  pt with hx of recent NSTEMI, CABG, MVR, and TVR.  pt PMH of HTN, Bipolar, Anxiety, CKD, and CAD.      PT Comments    Pt lethargic while supine in bed, but once PT initiates mobility pt opens eyes and participates in mobility.  Pt following >50% of simple directions.  Pt does attempt to nod his head to answer questions, but not 100% reliable.  Pt with increased strength and movement in R UE and LE as compared to previous session and bears weight through R LE during transfers.  Pt at end of session did mouth "I love you" to his wife with a 4-5second delay after she had told him she loved him.  Continue to feel pt would be a great candidate for CIR to maximize independence prior to returning to home.  Will continue to follow.    Follow Up Recommendations  CIR     Equipment Recommendations  None recommended by PT    Recommendations for Other Services       Precautions / Restrictions Precautions Precautions: Fall;Other (comment) Precaution Comments: IVC clamped by RN prior to session. Restrictions Weight Bearing Restrictions: No    Mobility  Bed Mobility Overal bed mobility: Needs Assistance;+2 for physical assistance Bed Mobility: Supine to Sit     Supine to sit: Mod assist;+2 for physical assistance;HOB elevated     General bed mobility comments: pt needs A to initiate movement with Bil LEs and once PT initiated trunk, pt able to participate bringing trunk up to sitting and utilized Bil UEs to do so.    Transfers Overall transfer level: Needs assistance Equipment used: 2 person hand held assist Transfers: Sit to/from [employer] Sit to Stand: Mod assist;+2 physical  assistance Stand pivot transfers: Mod assist;+2 physical assistance       General transfer comment: pt continues to need R knee blocked, but is actively moving Bil LEs for pivotal steps towards recliner.  pt R LE not buckling as bad as previous session.    Ambulation/Gait                 Stairs            Wheelchair Mobility    Modified Rankin (Stroke Patients Only) Modified Rankin (Stroke Patients Only) Pre-Morbid Rankin Score: Slight disability Modified Rankin: Severe disability     Balance Overall balance assessment: Needs assistance Sitting-balance support: Bilateral upper extremity supported;Feet supported Sitting balance-Leahy Scale: Fair     Standing balance support: During functional activity Standing balance-Leahy Scale: Poor                      Cognition Arousal/Alertness: Lethargic Behavior During Therapy: Flat affect Overall Cognitive Status: Difficult to assess         Following Commands: Follows one step commands inconsistently       General Comments: pt does follow > 50% of directions for simple tasks and once PT initiates movement, pt does participate with mobility.  pt does visually attend when name is called and when wife told pt she loved him, he mouthed back "I love you" with a 4-5 second delay.      Exercises      General Comments  Pertinent Vitals/Pain Pain Assessment: Faces Faces Pain Scale: No hurt    Home Living                      Prior Function            PT Goals (current goals can now be found in the care plan section) Acute Rehab PT Goals Patient Stated Goal: pt unable to state. PT Goal Formulation: Patient unable to participate in goal setting Time For Goal Achievement: 11/21/15 Potential to Achieve Goals: Good Progress towards PT goals: Progressing toward goals    Frequency  Min 3X/week    PT Plan Current plan remains appropriate    Co-evaluation             End of  Session Equipment Utilized During Treatment: Gait belt Activity Tolerance: Patient tolerated treatment well Patient left: in chair;with call bell/phone within reach;with chair alarm set;with family/visitor present     Time: 1610-9604 PT Time Calculation (min) (ACUTE ONLY): 33 min  Charges:  $Therapeutic Activity: 23-37 mins                    G CodesSunny [name], Jordan [name] [phone] 11/09/2015, 1:44 PM

## 2015-11-09 NOTE — Progress Notes (Signed)
Patient ID: Jordan [name], male   DOB: [birth year], 70 y.o.   MRN: [id number] Stable,ct head seen. To increase drain to 15 heo. levunox to be started to prevent dvt

## 2015-11-09 NOTE — Progress Notes (Signed)
SLP Cancellation Note  Patient Details Name: Jordan [name] MRN: [id number] DOB: 11-07-1945   Cancelled treatment:       Reason Eval/Treat Not Completed: Fatigue/lethargy limiting ability to participate  Ferdinand Lango MA, CCC-SLP [phone]  Ferdinand Lango Meryl 11/09/2015, 10:48 AM

## 2015-11-09 NOTE — Progress Notes (Signed)
STROKE TEAM PROGRESS NOTE   SUBJECTIVE (INTERVAL HISTORY) No family present. RN reports waxing and waning with speech, which is better with family. Ventricular  Drain drain well. Blood pressure well controlled.   OBJECTIVE Temp:  [97.3 F (36.3 C)-100.4 F (38 C)] 98.2 F (36.8 C) (01/19 0800) Pulse Rate:  [94-111] 104 (01/19 0700) Cardiac Rhythm:  [-] Normal sinus rhythm (01/18 2000) Resp:  [13-37] 34 (01/19 0700) BP: (111-155)/(69-106) 133/97 mmHg (01/19 0600) SpO2:  [89 %-100 %] 100 % (01/19 0700) Weight:  [90.1 kg (198 lb 10.2 oz)] 90.1 kg (198 lb 10.2 oz) (01/19 0500)  CBC:  Recent Labs Lab 11/05/15 0639  11/05/15 0844  11/07/15 0539 11/08/15 0530  WBC 11.9*  --  12.7*  < > 10.0 15.0*  NEUTROABS 9.1*  --  11.2*  --   --   --   HGB 10.5*  < > 9.7*  < > 8.5* 9.8*  HCT 33.6*  < > 31.8*  < > 27.9* 32.0*  MCV 87.0  --  85.9  < > 85.6 87.0  PLT 540*  --  517*  < > 428* 452*  < > = values in this interval not displayed.  Basic Metabolic Panel:   Recent Labs Lab 11/07/15 0539 11/08/15 0530  NA 144 147*  K 2.8* 3.7  CL 107 112*  CO2 24 24  GLUCOSE 104* 111*  BUN 14 13  CREATININE 1.84* 1.66*  CALCIUM 8.8* 9.1  MG 2.0 2.2  PHOS 4.1 3.4    Lipid Panel:     Component Value Date/Time   TRIG 136 11/05/2015 1650   HgbA1c:  Lab Results  Component Value Date   HGBA1C 5.6 10/12/2015   Urine Drug Screen:     Component Value Date/Time   LABOPIA NONE DETECTED 11/05/2015 0751   COCAINSCRNUR NONE DETECTED 11/05/2015 0751   LABBENZ NONE DETECTED 11/05/2015 0751   AMPHETMU NONE DETECTED 11/05/2015 0751   THCU NONE DETECTED 11/05/2015 0751   LABBARB NONE DETECTED 11/05/2015 0751      IMAGING  Ct Head Wo Contrast 11/09/2015 Evolving LEFT cerebral intraparenchymal hematoma with intraventricular extension. Stable position of RIGHT frontal ventriculostomy catheter, similar mild hydrocephalus, decreasing LEFT ventricular entrapment. Similar 5 mm LEFT-to-RIGHT midline  shift, and mild LEFT uncal herniation. 11/07/2015   Evolving LEFT cerebral hematoma with intraventricular extension. Stable position of RIGHT frontal ventriculostomy catheter with resolving hydrocephalus, decreasing LEFT ventricular entrapment. 5 mm LEFT-to-RIGHT midline shift, improved.  11/06/2015   Evolving LEFT intraparenchymal hematoma with intraventricular extension. Interval placement of RIGHT frontal ventriculostomy catheter with resolving hydrocephalus, however there is persistent LEFT temporal horn entrapment. 7 mm LEFT-to-RIGHT midline shift is likely postprocedural. New small amount of RIGHT frontal extra-axial pneumocephalus.  11/05/2015  1. Increase in volume of hemorrhage within the LEFT lateral ventricle anomaly within the LEFT atria and to lesser degree the temporal horn. 2. Increase in parenchymal edema surrounding the LEFT lateral ventricle hemorrhage. 3. No midline shift.  Basilar cisterns are patent. 4. No change in volume of hemorrhage within the third ventricle and fourth ventricle and anterior ventricles.  11/05/2015   New acute parenchymal hemorrhage abutting the atria of the left lateral ventricle measuring approximate 1.6 x 2.6 cm with mild adjacent edema. Hemorrhage extends into the ventricular system as described. Chronic ischemic microvascular disease. Small old left-sided lacunar infarct.   Dg Chest Port 1 View 11/07/2015 1. Lines and tubes in stable position. 2. Prior CABG. Stable cardiomegaly. No pulmonary venous congestion 3. Low volumes  with persistent bibasilar atelectasis. Persistent left lower lobe infiltrate noted. 11/06/2015  1. Lines and tubes in stable position. 2. Heart size stable. No pulmonary venous congestion on today's exam. 3. Persistent but improving left lower lobe infiltrate/edema. Low lung volumes. Small left pleural effusion cannot be excluded.  11/05/2015  Hypoinflation with findings suggesting mild vascular congestion. Tubes and lines as described.  Interval placement left IJ central venous catheter with tip over the SVC. No pneumothorax.  11/05/2015   Endotracheal tube and nasogastric tube in appropriate position. Improved aeration with decreased bibasilar atelectasis.  11/05/2015   Hypo inflation with mild opacification of the medial left base which may be due to effusions/atelectasis although cannot exclude infection. Suggestion of minimal vascular congestion.    PHYSICAL EXAM Elderly Caucasian male   . Afebrile. Head is nontraumatic. Neck is supple without bruit.    Cardiac exam no murmur or gallop. Lungs are clear to auscultation. Distal pulses are well felt. Neurological Exam :  Awake  and follows commands. Severe dysarthria and difficulty understanding at times. Follows only occasional simple midline commands. Left gaze preference able to look to the right barely past midline. Blinks to threat on the left but not on the right. Pupils irregular but reactive. Fundi were not visualized. Vision acuity cannot be reliably tested. Mild right lower facial weakness. Tongue midline. Motor system exam antigravity and normal strength on the left side. Right hemiplegia with grade 1-2/5  strength on the right. Diminished on the right. Sensation appear diminished on the right. Right plantar upgoing left downgoing. Gait was not tested.   ASSESSMENT/PLAN Mr. Jordan [name] is a 70 y.o. male with history of hypertension, recent NSTEMI status post CABG in December, AFIB w/ RVR on coumadin presenting with confusion. CT show a periventricular hemorrhage with IVH.  Stroke:  left parietal intraparenchymal hemorrhage with intraventricular extension and increase in hemorrhage size since admission. Hydrocephalus. Cerebral edema. Uncal herniation. Hemorrhage secondary to warfarin related coagulopathy  Resultant  R HP, expressive aphasia, follows one-step commands  IVC insertion R frontal (Botero 1/15). Normal  Drainage. stable  CT Still with stable edema, shift.  Decreasing left ventricular entrapment  CL placed for possible 3%, ok to d/c  HgbA1c 5.6 in Dec  SCDs for VTE prophylaxis. Recommend change to lovenox if ok with Botero DIET DYS 3 Room service appropriate?: Yes; Fluid consistency:: Thin  aspirin 81 mg daily and warfarin daily prior to admission  Ongoing aggressive stroke risk factor management  Continue ICU level care given IVC  Therapy recommendations:  CIR (previous notes indicate wife wants SNF, unsure her understanding, RN will address with wife)  Disposition:  pending   Acute respiratory failure, resolved  intubated in the ED after neurologic worsening  Extubated 11/07/15  Atrial Fibrillation  Home anticoagulation:  Warfarin  INR 2.81 on admission  Rapid reversal protocol due to IVH  INR now 1.16  Consider aspirin after 1 week and NOAC after 1 month if hemorrhage remains stable  Hypertension  Stable, low at times  Hyperlipidemia  Home meds:  Lipitor 10  Resume lipitor at discharge  Other Stroke Risk Factors  Advanced age  Former Cigarette smoker, quit smoking 32 years ago   Obesity, Body mass index is 29.32 kg/(m^2).   Coronary artery disease - NSTEMI s/p CABG 12/16  Obstructive sleep apnea  Other Active Problems  Bipolar d/o  Stage III chronic kidney disease, baseline creatinine 1.8  Hypokalemia, repleted. CCM following and addressing  Anemia of critical illness  Lactic  acidosis in setting of [facility]  Hospital day # 4  [facility] See Amion for Pager information 11/09/2015 9:12 AM  I have personally examined this patient, reviewed notes, independently viewed imaging studies, participated in medical decision making and plan of care. I have made any additions or clarifications directly to the above note. Agree with note above.  I had a long discussion with the patient's wife at the bedside regarding his prognosis plan for care and answered questions. She agrees with  inpatient rehabilitation. Recommend discontinuing SCDs and started Lovenox for DVT prevention. I also spoke to Dr. Jeral Fruit who plans to wean the ventriculostomy catheter over the next few days. Hopefully patient can be transferred to rehabilitation early part of next week This patient is critically ill and at significant risk of neurological worsening, death and care requires constant monitoring of vital signs, hemodynamics,respiratory and cardiac monitoring, extensive review of multiple databases, frequent neurological assessment, discussion with family, other specialists and medical decision making of high complexity.I have made any additions or clarifications directly to the above note.This critical care time does not reflect procedure time, or teaching time or supervisory time of PA/NP/Med Resident etc but could involve care discussion time.  I spent 35 minutes of neurocritical care time  in the care of  this patient.     Delia Heady, MD Medical Director [facility] Pager: [phone] 11/09/2015 2:20 PM        To contact Stroke Continuity provider, please refer to [email or web address]. After hours, contact General Neurology

## 2015-11-09 NOTE — Care Management (Signed)
UR updated.  

## 2015-11-10 ENCOUNTER — Inpatient Hospital Stay (HOSPITAL_COMMUNITY): Payer: PPO

## 2015-11-10 LAB — CULTURE, BLOOD (ROUTINE X 2)
CULTURE: NO GROWTH
CULTURE: NO GROWTH

## 2015-11-10 NOTE — Progress Notes (Signed)
Rehab admissions - Patient sitting up in chair now.  PT/OT report patient did well with therapies.  I will contact wife to discuss potential rehab plans.  Patient still with IVC drain.  I will follow up on Monday for progress and plans.  Call me for questions.  #[phone]

## 2015-11-10 NOTE — Progress Notes (Signed)
STROKE TEAM PROGRESS NOTE   SUBJECTIVE (INTERVAL HISTORY) No family present. RN reports waxing and waning with speech, which is better with family. Ventricular  drain draining well. Blood pressure well controlled. Pop off  15 cm today OBJECTIVE Temp:  [97.8 F (36.6 C)-99.9 F (37.7 C)] 99.5 F (37.5 C) (01/20 0800) Pulse Rate:  [86-112] 86 (01/20 0700) Cardiac Rhythm:  [-] Sinus tachycardia (01/19 2000) Resp:  [17-34] 24 (01/20 0700) BP: (109-166)/(68-118) 155/113 mmHg (01/20 0700) SpO2:  [90 %-100 %] 100 % (01/20 0700) Weight:  [205 lb 11 oz (93.3 kg)] 205 lb 11 oz (93.3 kg) (01/20 0500)  CBC:  Recent Labs Lab 11/05/15 0639  11/05/15 0844  11/07/15 0539 11/08/15 0530  WBC 11.9*  --  12.7*  < > 10.0 15.0*  NEUTROABS 9.1*  --  11.2*  --   --   --   HGB 10.5*  < > 9.7*  < > 8.5* 9.8*  HCT 33.6*  < > 31.8*  < > 27.9* 32.0*  MCV 87.0  --  85.9  < > 85.6 87.0  PLT 540*  --  517*  < > 428* 452*  < > = values in this interval not displayed.  Basic Metabolic Panel:   Recent Labs Lab 11/07/15 0539 11/08/15 0530  NA 144 147*  K 2.8* 3.7  CL 107 112*  CO2 24 24  GLUCOSE 104* 111*  BUN 14 13  CREATININE 1.84* 1.66*  CALCIUM 8.8* 9.1  MG 2.0 2.2  PHOS 4.1 3.4    Lipid Panel:     Component Value Date/Time   TRIG 136 11/05/2015 1650   HgbA1c:  Lab Results  Component Value Date   HGBA1C 5.6 10/12/2015   Urine Drug Screen:     Component Value Date/Time   LABOPIA NONE DETECTED 11/05/2015 0751   COCAINSCRNUR NONE DETECTED 11/05/2015 0751   LABBENZ NONE DETECTED 11/05/2015 0751   AMPHETMU NONE DETECTED 11/05/2015 0751   THCU NONE DETECTED 11/05/2015 0751   LABBARB NONE DETECTED 11/05/2015 0751      IMAGING  Ct Head Wo Contrast 11/09/2015 Evolving LEFT cerebral intraparenchymal hematoma with intraventricular extension. Stable position of RIGHT frontal ventriculostomy catheter, similar mild hydrocephalus, decreasing LEFT ventricular entrapment. Similar 5 mm  LEFT-to-RIGHT midline shift, and mild LEFT uncal herniation. 11/07/2015   Evolving LEFT cerebral hematoma with intraventricular extension. Stable position of RIGHT frontal ventriculostomy catheter with resolving hydrocephalus, decreasing LEFT ventricular entrapment. 5 mm LEFT-to-RIGHT midline shift, improved.  11/06/2015   Evolving LEFT intraparenchymal hematoma with intraventricular extension. Interval placement of RIGHT frontal ventriculostomy catheter with resolving hydrocephalus, however there is persistent LEFT temporal horn entrapment. 7 mm LEFT-to-RIGHT midline shift is likely postprocedural. New small amount of RIGHT frontal extra-axial pneumocephalus.  11/05/2015  1. Increase in volume of hemorrhage within the LEFT lateral ventricle anomaly within the LEFT atria and to lesser degree the temporal horn. 2. Increase in parenchymal edema surrounding the LEFT lateral ventricle hemorrhage. 3. No midline shift.  Basilar cisterns are patent. 4. No change in volume of hemorrhage within the third ventricle and fourth ventricle and anterior ventricles.  11/05/2015   New acute parenchymal hemorrhage abutting the atria of the left lateral ventricle measuring approximate 1.6 x 2.6 cm with mild adjacent edema. Hemorrhage extends into the ventricular system as described. Chronic ischemic microvascular disease. Small old left-sided lacunar infarct.   Dg Chest Port 1 View 11/07/2015 1. Lines and tubes in stable position. 2. Prior CABG. Stable cardiomegaly. No pulmonary venous congestion  3. Low volumes with persistent bibasilar atelectasis. Persistent left lower lobe infiltrate noted. 11/06/2015  1. Lines and tubes in stable position. 2. Heart size stable. No pulmonary venous congestion on today's exam. 3. Persistent but improving left lower lobe infiltrate/edema. Low lung volumes. Small left pleural effusion cannot be excluded.  11/05/2015  Hypoinflation with findings suggesting mild vascular congestion. Tubes and  lines as described. Interval placement left IJ central venous catheter with tip over the SVC. No pneumothorax.  11/05/2015   Endotracheal tube and nasogastric tube in appropriate position. Improved aeration with decreased bibasilar atelectasis.  11/05/2015   Hypo inflation with mild opacification of the medial left base which may be due to effusions/atelectasis although cannot exclude infection. Suggestion of minimal vascular congestion.    PHYSICAL EXAM Elderly Caucasian male   . Afebrile. Head is nontraumatic. Neck is supple without bruit.    Cardiac exam no murmur or gallop. Lungs are clear to auscultation. Distal pulses are well felt. Neurological Exam :  Awake  and follows commands. Severe dysarthria and difficulty understanding at times. Follows only occasional simple midline commands. Left gaze preference able to look to the right barely past midline. Blinks to threat on the left but not on the right. Pupils irregular but reactive. Fundi were not visualized. Vision acuity cannot be reliably tested. Mild right lower facial weakness. Tongue midline. Motor system exam antigravity and normal strength on the left side. Right hemiplegia with grade 1-2/5  strength on the right. Diminished on the right. Sensation appear diminished on the right. Right plantar upgoing left downgoing. Gait was not tested.   ASSESSMENT/PLAN Mr. Jordan [name] is a 70 y.o. male with history of hypertension, recent NSTEMI status post CABG in December, AFIB w/ RVR on coumadin presenting with confusion. CT show a periventricular hemorrhage with IVH.  Stroke:  left parietal intraparenchymal hemorrhage with intraventricular extension and increase in hemorrhage size since admission. Hydrocephalus. Cerebral edema. Uncal herniation. Hemorrhage secondary to warfarin related coagulopathy  Resultant  R HP, expressive aphasia, follows one-step commands  IVC insertion R frontal (Botero 1/15). Normal  Drainage. stable  CT Still with  stable edema, shift. Decreasing left ventricular entrapment  CL placed for possible 3%, ok to d/c  HgbA1c 5.6 in Dec  SCDs for VTE prophylaxis. Recommend change to lovenox if ok with Botero DIET DYS 3 Room service appropriate?: Yes; Fluid consistency:: Thin  aspirin 81 mg daily and warfarin daily prior to admission  Ongoing aggressive stroke risk factor management  Continue ICU level care given IVC  Therapy recommendations:  CIR (previous notes indicate wife wants SNF, unsure her understanding, RN will address with wife)  Disposition:  pending   Acute respiratory failure, resolved  intubated in the ED after neurologic worsening  Extubated 11/07/15  Atrial Fibrillation  Home anticoagulation:  Warfarin  INR 2.81 on admission  Rapid reversal protocol due to IVH  INR now 1.16  Consider aspirin after 1 week and NOAC after 1 month if hemorrhage remains stable  Hypertension  Stable, low at times  Hyperlipidemia  Home meds:  Lipitor 10  Resume lipitor at discharge  Other Stroke Risk Factors  Advanced age  Former Cigarette smoker, quit smoking 32 years ago   Obesity, Body mass index is 30.36 kg/(m^2).   Coronary artery disease - NSTEMI s/p CABG 12/16  Obstructive sleep apnea  Other Active Problems  Bipolar d/o  Stage III chronic kidney disease, baseline creatinine 1.8  Hypokalemia, repleted. CCM following and addressing  Anemia of critical  illness  Lactic acidosis in setting of [facility]  Hospital day # 5  Kendel Pesnell  [facility] See Amion for Pager information 11/10/2015 12:33 PM  I have personally examined this patient, reviewed notes, independently viewed imaging studies, participated in medical decision making and plan of care. I have made any additions or clarifications directly to the above note. Agree with note above.  I had a long discussion with the patient's wife at the bedside regarding his prognosis plan for care and answered  questions. She agrees with inpatient rehabilitation. Recommend discontinuing SCDs and started Lovenox for DVT prevention. I also spoke to Dr. Jeral Fruit who plans to wean the ventriculostomy catheter over the next few days. Hopefully patient can be transferred to rehabilitation early part of next week This patient is critically ill and at significant risk of neurological worsening, death and care requires constant monitoring of vital signs, hemodynamics,respiratory and cardiac monitoring, extensive review of multiple databases, frequent neurological assessment, discussion with family, other specialists and medical decision making of high complexity.I have made any additions or clarifications directly to the above note.This critical care time does not reflect procedure time, or teaching time or supervisory time of PA/NP/Med Resident etc but could involve care discussion time.  I spent 30 minutes of neurocritical care time  in the care of  this patient.     Delia Heady, MD Medical Director [facility] Pager: [phone] 11/10/2015 12:33 PM        To contact Stroke Continuity provider, please refer to [email or web address]. After hours, contact General Neurology

## 2015-11-10 NOTE — Progress Notes (Signed)
Physical Therapy Treatment Patient Details Name: Jordan [name] MRN: [id number] DOB: 01-Oct-1946 Today's Date: 11/10/2015    History of Present Illness pt presents with L PArietal Intraparenchymal Hemmorhage with Intraventricular extension, Intubated 1/15-1/17, Cerebral Edema, and Hydrocephalus.  pt with hx of recent NSTEMI, CABG, MVR, and TVR.  pt PMH of HTN, Bipolar, Anxiety, CKD, and CAD.      PT Comments    Pt continues to show improvement in ability to move R UE and LE functionally and attempts to follow directions.  Pt does need the initial initiation for coming to sit at EOB, otherwise only cues needed for mobility.  Pt much more alert during mobility and was even able to work on self-feeding in chair with OT after mobility and attempted to say "good" when OT assisted with drinking orange juice.  Continue to feel he would be a great candidate for CIR at D/C.    Follow Up Recommendations  CIR     Equipment Recommendations  None recommended by PT    Recommendations for Other Services       Precautions / Restrictions Precautions Precautions: Fall;Other (comment) Precaution Comments: IVC clamped by RN prior to session. Restrictions Weight Bearing Restrictions: No    Mobility  Bed Mobility Overal bed mobility: Needs Assistance;+2 for physical assistance Bed Mobility: Supine to Sit     Supine to sit: Mod assist;+2 for physical assistance;HOB elevated     General bed mobility comments: A needed for initiating coming to sitting, but pt then participates well and attempts moving R UE and LE to EOB.    Transfers Overall transfer level: Needs assistance Equipment used: 2 person hand held assist Transfers: Sit to/from [employer] Sit to Stand: Mod assist;+2 physical assistance Stand pivot transfers: Mod assist;+2 physical assistance       General transfer comment: pt only needs verbal cues for coming to standing and is able to weightbear on R LE without  needing R knee blocked.  Ambulation/Gait                 Stairs            Wheelchair Mobility    Modified Rankin (Stroke Patients Only) Modified Rankin (Stroke Patients Only) Pre-Morbid Rankin Score: Slight disability Modified Rankin: Severe disability     Balance Overall balance assessment: Needs assistance Sitting-balance support: Bilateral upper extremity supported;Feet supported Sitting balance-Leahy Scale: Fair Sitting balance - Comments: MinG for safety, but no physical A to maintain balance when using Bil UEs.     Standing balance support: No upper extremity supported;During functional activity Standing balance-Leahy Scale: Poor Standing balance comment: pt with improved standing tolerance and ability to bear weight on R LE.  MinA x2 for static standing.                    Cognition Arousal/Alertness: Lethargic Behavior During Therapy: Flat affect Overall Cognitive Status: Difficult to assess Area of Impairment: Following commands       Following Commands: Follows one step commands inconsistently       General Comments: pt continues to improve with following directions, but still not 100%.  pt needs initiation for coming to sitting EOB, but only cues for initiating coming to stand and pivot to chair.  pt grunts throughout session but did attempt verbalizing "good" when given orange juice in sitting.  pt does nod head, but 100% accurate.      Exercises      General Comments  Pertinent Vitals/Pain Pain Assessment: Faces Faces Pain Scale: No hurt    Home Living                      Prior Function            PT Goals (current goals can now be found in the care plan section) Acute Rehab PT Goals Patient Stated Goal: pt unable to state. PT Goal Formulation: Patient unable to participate in goal setting Time For Goal Achievement: 11/21/15 Potential to Achieve Goals: Good Progress towards PT goals: Progressing toward  goals    Frequency  Min 3X/week    PT Plan Current plan remains appropriate    Co-evaluation PT/OT/SLP Co-Evaluation/Treatment: Yes Reason for Co-Treatment: For patient/therapist safety         End of Session Equipment Utilized During Treatment: Gait belt Activity Tolerance: Patient tolerated treatment well Patient left: in chair;with call bell/phone within reach;with chair alarm set     Time: 1610-9604 PT Time Calculation (min) (ACUTE ONLY): 26 min  Charges:  $Therapeutic Activity: 8-22 mins                    G CodesSunny Schlein, [place] [phone] 11/10/2015, 11:06 AM

## 2015-11-10 NOTE — Progress Notes (Addendum)
Pt fell out of chair at approximately 1 PM. He was in recliner with chair alarm on. I was in room next door and ran in when i heard alarm. He was on the floor. He was helped back to bed. IVC became disconnected at a port. IVC cleansed and reattached. His family was notified. Dr Yetta Barre was notified. Dr Pearlean Brownie was notified. CT scan was obtained. Restraints were added per order. Pt seems to be unharmed, except a small 1" pink raised area to rt forehead and possibly rt cheek pink. Continue close vital and neuro exams as well as close monitoring for pt safety.

## 2015-11-10 NOTE — Progress Notes (Signed)
Pt restless, pulling at tubes and lines, I placed mittens. I have requested Recruitment consultant. Bed alarm on, bed low.

## 2015-11-10 NOTE — Progress Notes (Signed)
Occupational Therapy Treatment Patient Details Name: Jordan [name] MRN: [id number] DOB: Aug 16, 1946 Today's Date: 11/10/2015    History of present illness pt presents with L PArietal Intraparenchymal Hemmorhage with Intraventricular extension, Intubated 1/15-1/17, Cerebral Edema, and Hydrocephalus.  pt with hx of recent NSTEMI, CABG, MVR, and TVR.  pt PMH of HTN, Bipolar, Anxiety, CKD, and CAD.     OT comments  Improvement noted in pt's ability to use R UE functionally for finger feeding.  Able to use utensil with L hand to bring to mouth once loaded. Drinking with L hand with supervision.  Pt stating, "Good cup of milk." Pt alert throughout session. Yes/no not consistently reliable. Pt able to make choices with eating. Improved following commands, need multimodal cues.  Follow Up Recommendations  CIR;Supervision/Assistance - 24 hour    Equipment Recommendations  3 in 1 bedside comode    Recommendations for Other Services      Precautions / Restrictions Precautions Precautions: Fall Precaution Comments: IVC clamped by RN prior to session. Restrictions Weight Bearing Restrictions: No       Mobility Bed Mobility Overal bed mobility: Needs Assistance;+2 for physical assistance Bed Mobility: Supine to Sit     Supine to sit: Mod assist;+2 for physical assistance;HOB elevated     General bed mobility comments: A needed for initiating coming to sitting, but pt then participates well and attempts moving R UE and LE to EOB.    Transfers Overall transfer level: Needs assistance Equipment used: 2 person hand held assist Transfers: Sit to/from [employer] Sit to Stand: Mod assist;+2 physical assistance Stand pivot transfers: Mod assist;+2 physical assistance       General transfer comment: pt only needs verbal cues for coming to standing and is able to weightbear on R LE without needing R knee blocked.    Balance Overall balance assessment: Needs  assistance Sitting-balance support: Bilateral upper extremity supported;Feet supported Sitting balance-Leahy Scale: Fair Sitting balance - Comments: MinG for safety, but no physical A to maintain balance when using Bil UEs.     Standing balance support: No upper extremity supported;During functional activity Standing balance-Leahy Scale: Poor Standing balance comment: pt with improved standing tolerance and ability to bear weight on R LE.  MinA x2 for static standing.                   ADL Overall ADL's : Needs assistance/impaired Eating/Feeding: Minimal assistance;Sitting Eating/Feeding Details (indicate cue type and reason): worked on self feeding from chair Grooming: Wash/dry hands;Moderate assistance;Sitting                                        Vision                 Additional Comments: pt locating food items across tray   Perception     Praxis      Cognition   Behavior During Therapy: Flat affect Overall Cognitive Status: Difficult to assess Area of Impairment: Following commands        Following Commands: Follows one step commands inconsistently       General Comments: pt continues to improve with following directions, but still not 100%.  pt needs initiation for coming to sitting EOB, but only cues for initiating coming to stand and pivot to chair.  pt grunts throughout session but did attempt verbalizing "good" when given orange juice in sitting.  pt does nod head, but 100% accurate.      Extremity/Trunk Assessment               Exercises     Shoulder Instructions       General Comments      Pertinent Vitals/ Pain       Pain Assessment: Faces Faces Pain Scale: No hurt  Home Living                                          Prior Functioning/Environment              Frequency Min 2X/week     Progress Toward Goals  OT Goals(current goals can now be found in the care plan section)   Progress towards OT goals: Progressing toward goals  Acute Rehab OT Goals Patient Stated Goal: pt unable to state.  Plan Discharge plan remains appropriate    Co-evaluation    PT/OT/SLP Co-Evaluation/Treatment: Yes Reason for Co-Treatment: For patient/therapist safety   OT goals addressed during session: ADL's and self-care      End of Session Equipment Utilized During Treatment: Gait belt   Activity Tolerance Patient tolerated treatment well   Patient Left in chair;with call bell/phone within reach;with chair alarm set;with nursing/sitter in room   Nurse Communication  (ok to leave mitts off)        Time: 1610-9604 OT Time Calculation (min): 41 min  Charges: OT General Charges $OT Visit: 1 Procedure OT Treatments $Self Care/Home Management : 23-37 mins  Evern Bio 11/10/2015, 11:21 AM  [phone]

## 2015-11-10 NOTE — Progress Notes (Signed)
Patient ID: Jordan [name], male   DOB: 07/01/1946, 70 y.o.   MRN: [id number] IVC draining at 15 cm h20. Continue as per neuro

## 2015-11-10 NOTE — Progress Notes (Signed)
Speech Language Pathology Treatment: Dysphagia;Cognitive-Linquistic  Patient Details Name: Jordan [name] MRN: [id number] DOB: [birth year] Today's Date: 11/10/2015 Time: 1520-1540 SLP Time Calculation (min) (ACUTE ONLY): 20 min  Assessment / Plan / Recommendation Clinical Impression  Pt with improved spontaneity of verbalizations today, though notably unintelligible with paraphasias and frequent perseverations.  With max cues for pacing and rhythmic output, pt able to approximate counting 1-10 and name the days of the week in unison. Tolerating dysphagia 3 diet and thin liquids with min cues for rate.  Ex-wife present and feeding pt.  Educated re: aphasia and dysphagia and rehabilitation of both.  Verbalized understanding.  Continue SLP.    HPI HPI: 71 y.o. male with history of hypertension, recent NSTEMI status post CABG in December, AFIB w/ RVR on coumadin presenting with confusion on 1/15. CT shows a left parietal intraparenchymal hemorrhage with intraventricular extension.  IVC right frontal 1/15.  CT 1/17 still with significant edema with 7 mm shift.  Intubated 1/15-1/17      SLP Plan  Continue with current plan of care     Recommendations  Diet recommendations: Dysphagia 3 (mechanical soft);Thin liquid Liquids provided via: Cup;No straw Medication Administration: Whole meds with puree Supervision: Full supervision/cueing for compensatory strategies Compensations: Slow rate;Small sips/bites Postural Changes and/or Swallow Maneuvers: Seated upright 90 degrees;Upright 30-60 min after meal             Oral Care Recommendations: Oral care BID Follow up Recommendations: Inpatient Rehab Plan: Continue with current plan of care     GO                Jordan [name] 11/10/2015, 4:04 PM

## 2015-11-10 NOTE — Clinical Social Work Note (Signed)
Clinical Social Worker continuing to follow patient and family for support and discharge planning needs.  Patient and patient wife [facility]able [facility] and per PT notes, patient is participating well with therapies.  CSW to follow along closely for placement options in the event that patient is not appropriate and/or accepted to inpatient rehab.  Patient currently with a ventric drain in place.  CSW remains available for support and to facilitate patient discharge needs once medically stable.  Macario Golds, [place] [phone]

## 2015-11-10 NOTE — Care Management Important Message (Signed)
Important Message  Patient Details  Name: Jordan [name] MRN: [id number] Date of Birth: [birth year]   Medicare Important Message Given:  Yes    Kyla Balzarine 11/10/2015, 2:54 PM

## 2015-11-11 ENCOUNTER — Inpatient Hospital Stay (HOSPITAL_COMMUNITY): Payer: PPO

## 2015-11-11 LAB — BASIC METABOLIC PANEL
Anion gap: 11 (ref 5–15)
BUN: 22 mg/dL — AB (ref 6–20)
CHLORIDE: 107 mmol/L (ref 101–111)
CO2: 23 mmol/L (ref 22–32)
CREATININE: 1.67 mg/dL — AB (ref 0.61–1.24)
Calcium: 9.1 mg/dL (ref 8.9–10.3)
GFR calc Af Amer: 46 mL/min — ABNORMAL LOW (ref >60)
GFR calc non Af Amer: 40 mL/min — ABNORMAL LOW (ref >60)
GLUCOSE: 112 mg/dL — AB (ref 65–99)
Potassium: 4 mmol/L (ref 3.5–5.1)
SODIUM: 141 mmol/L (ref 135–145)

## 2015-11-11 LAB — CBC WITH DIFFERENTIAL/PLATELET
Basophils Absolute: 0 10*3/uL (ref 0.0–0.1)
Basophils Relative: 0 %
EOS ABS: 0 10*3/uL (ref 0.0–0.7)
EOS PCT: 0 %
HCT: 34.4 % — ABNORMAL LOW (ref 39.0–52.0)
Hemoglobin: 10.7 g/dL — ABNORMAL LOW (ref 13.0–17.0)
LYMPHS ABS: 1.4 10*3/uL (ref 0.7–4.0)
Lymphocytes Relative: 10 %
MCH: 26.4 pg (ref 26.0–34.0)
MCHC: 31.1 g/dL (ref 30.0–36.0)
MCV: 84.9 fL (ref 78.0–100.0)
MONO ABS: 1.4 10*3/uL — AB (ref 0.1–1.0)
MONOS PCT: 9 %
Neutro Abs: 11.7 10*3/uL — ABNORMAL HIGH (ref 1.7–7.7)
Neutrophils Relative %: 81 %
Platelets: 531 10*3/uL — ABNORMAL HIGH (ref 150–400)
RBC: 4.05 MIL/uL — ABNORMAL LOW (ref 4.22–5.81)
RDW: 14.6 % (ref 11.5–15.5)
WBC: 14.5 10*3/uL — AB (ref 4.0–10.5)

## 2015-11-11 NOTE — Progress Notes (Signed)
STROKE TEAM PROGRESS NOTE  HISTORY ON ADMISSION  Jordan [name] is a 70 y.o. male with a history of htn, recent NSTEMI in December who underwent CABG at that time. He was found to have Afib with RVR and was started on coumadin. He was doing well until last night, when he began to feel ill and have n/v. This AM, he was found to be confused and EMS was called. He was found to have a new peri-ventricular hemorrhage with large amounts of IVH.   He was initially following commands when he first presented, but has subsequently worsened and is no longer doing so.   ICH score: 2(though with IVH, may be > 30cc which would be a 3)  LKW: 1/14  tpa given?: no, hemorrhage    SUBJECTIVE (INTERVAL HISTORY) No family present. RN reports waxing and waning with speech, which is better with family. Ventricular  drain draining well. Blood pressure well controlled. PT/OT reports patient did well.   OBJECTIVE Temp:  [97.4 F (36.3 C)-99.7 F (37.6 C)] 97.8 F (36.6 C) (01/21 0800) Pulse Rate:  [66-114] 100 (01/21 0700) Cardiac Rhythm:  [-] Sinus tachycardia (01/20 2000) Resp:  [16-35] 26 (01/21 0700) BP: (118-158)/(77-121) 135/88 mmHg (01/21 0700) SpO2:  [89 %-100 %] 100 % (01/21 0700) Weight:  [93.1 kg (205 lb 4 oz)] 93.1 kg (205 lb 4 oz) (01/21 0500)  CBC:  Recent Labs Lab 11/05/15 0639  11/05/15 0844  11/07/15 0539 11/08/15 0530  WBC 11.9*  --  12.7*  < > 10.0 15.0*  NEUTROABS 9.1*  --  11.2*  --   --   --   HGB 10.5*  < > 9.7*  < > 8.5* 9.8*  HCT 33.6*  < > 31.8*  < > 27.9* 32.0*  MCV 87.0  --  85.9  < > 85.6 87.0  PLT 540*  --  517*  < > 428* 452*  < > = values in this interval not displayed.  Basic Metabolic Panel:   Recent Labs Lab 11/07/15 0539 11/08/15 0530  NA 144 147*  K 2.8* 3.7  CL 107 112*  CO2 24 24  GLUCOSE 104* 111*  BUN 14 13  CREATININE 1.84* 1.66*  CALCIUM 8.8* 9.1  MG 2.0 2.2  PHOS 4.1 3.4    Lipid Panel:     Component Value Date/Time   TRIG 136  11/05/2015 1650   HgbA1c:  Lab Results  Component Value Date   HGBA1C 5.6 10/12/2015   Urine Drug Screen:     Component Value Date/Time   LABOPIA NONE DETECTED 11/05/2015 0751   COCAINSCRNUR NONE DETECTED 11/05/2015 0751   LABBENZ NONE DETECTED 11/05/2015 0751   AMPHETMU NONE DETECTED 11/05/2015 0751   THCU NONE DETECTED 11/05/2015 0751   LABBARB NONE DETECTED 11/05/2015 0751      IMAGING   Ct Head Wo Contrast 11/09/2015  Evolving LEFT cerebral intraparenchymal hematoma with intraventricular extension. Stable position of RIGHT frontal ventriculostomy catheter, similar mild hydrocephalus, decreasing LEFT ventricular entrapment. Similar 5 mm LEFT-to-RIGHT midline shift, and mild LEFT uncal Herniation.   11/07/2015    Evolving LEFT cerebral hematoma with intraventricular extension. Stable position of RIGHT frontal ventriculostomy catheter with resolving hydrocephalus, decreasing LEFT ventricular entrapment. 5 mm LEFT-to-RIGHT midline shift, improved.   11/06/2015    Evolving LEFT intraparenchymal hematoma with intraventricular extension. Interval placement of RIGHT frontal ventriculostomy catheter with resolving hydrocephalus, however there is persistent LEFT temporal horn entrapment. 7 mm LEFT-to-RIGHT midline shift is likely postprocedural. New  small amount of RIGHT frontal extra-axial pneumocephalus.   11/05/2015   1. Increase in volume of hemorrhage within the LEFT lateral ventricle anomaly within the LEFT atria and to lesser degree the temporal horn. 2. Increase in parenchymal edema surrounding the LEFT lateral ventricle hemorrhage. 3. No midline shift.  Basilar cisterns are patent. 4. No change in volume of hemorrhage within the third ventricle and fourth ventricle and anterior ventricles.   11/05/2015    New acute parenchymal hemorrhage abutting the atria of the left lateral ventricle measuring approximate 1.6 x 2.6 cm with mild adjacent edema. Hemorrhage extends into the  ventricular system as described. Chronic ischemic microvascular disease. Small old left-sided lacunar infarct.    Dg Chest Port 1 View 11/07/2015  1. Lines and tubes in stable position. 2. Prior CABG. Stable cardiomegaly. No pulmonary venous congestion 3. Low volumes with persistent bibasilar atelectasis. Persistent left lower lobe infiltrate noted.  11/06/2015   1. Lines and tubes in stable position. 2. Heart size stable. No pulmonary venous congestion on today's exam. 3. Persistent but improving left lower lobe infiltrate/edema. Low lung volumes. Small left pleural effusion cannot be excluded.   11/05/2015   Hypoinflation with findings suggesting mild vascular congestion. Tubes and lines as described. Interval placement left IJ central venous catheter with tip over the SVC. No pneumothorax.   11/05/2015    Endotracheal tube and nasogastric tube in appropriate position. Improved aeration with decreased bibasilar atelectasis.   11/05/2015    Hypo inflation with mild opacification of the medial left base which may be due to effusions/atelectasis although cannot exclude infection. Suggestion of minimal vascular congestion.    PHYSICAL EXAM Elderly Caucasian male   . Afebrile. Head is s/p EVD.Marland Kitchen Neck is supple without bruit.    Cardiac exam no murmur or gallop. Lungs are clear to auscultation. Distal pulses are well felt. Neurological Exam :  Sleepy but arousable and follows commands. Oriented to person only. Severe dysarthria and difficulty understanding at times. Follows only occasional simple midline commands. Left gaze preference able to look to the right barely past midline. Blinks to threat on the left but not on the right. Pupils irregular but reactive. Fundi were not visualized. Vision acuity cannot be reliably tested. Mild right lower facial weakness. Tongue midline. Motor system exam antigravity and normal strength on the left side. Right hemiplegia with grade 1-2/5  strength on the  right. Diminished on the right. Sensation appear diminished on the right. Right plantar upgoing left downgoing. Gait was not tested.   ASSESSMENT/PLAN Jordan [name] is a 70 y.o. male with history of hypertension, recent NSTEMI status post CABG in December, AFIB w/ RVR on coumadin presenting with confusion. CT show a periventricular hemorrhage with IVH.  Stroke:  left parietal intraparenchymal hemorrhage with intraventricular extension and increase in hemorrhage size since admission. Hydrocephalus. Cerebral edema. Uncal herniation. Hemorrhage secondary to warfarin related coagulopathy  Resultant  R HP, expressive aphasia, follows one-step commands  IVC insertion R frontal ([place] 1/15). Normal  Drainage. stable  CT Still with stable edema, shift. Decreasing left ventricular entrapment  CL placed for possible 3%, ok to d/c  HgbA1c 5.6 in Dec  SCDs for VTE prophylaxis. Now on Lovenox. DIET DYS 3 Room service appropriate?: Yes; Fluid consistency:: Thin  aspirin 81 mg daily and warfarin daily prior to admission. No antithrombotic's at this time secondary to hemorrhage.  Ongoing aggressive stroke risk factor management  Continue ICU level care given IVC  Therapy recommendations:  CIR (previous  notes indicate wife wants SNF, unsure her understanding, RN will address with wife)  Disposition:  pending    Acute respiratory failure, resolved  Intubated in the ED after neurologic worsening  Extubated 11/07/15  Persistent left lower lobe infiltrate on chest x-ray.   Atrial Fibrillation  Home anticoagulation:  Warfarin and aspirin 81 mg.  INR 2.81 on admission  Rapid reversal protocol due to IVH  INR now 1.16  Consider aspirin after 1 week and NOAC after 1 month if hemorrhage remains stable  Hypertension  Stable, low at times  Hyperlipidemia  Home meds:  Lipitor 10  Resume lipitor at discharge  Other Stroke Risk Factors  Advanced age  Former Cigarette smoker,  quit smoking 32 years ago   Obesity, Body mass index is 30.3 kg/(m^2).   Coronary artery disease - NSTEMI s/p CABG 12/16  Obstructive sleep apnea  Other Active Problems  Bipolar d/o  Stage III chronic kidney disease, baseline creatinine 1.8  Hypokalemia, repleted. CCM following and addressing  Anemia of critical illness  Lactic acidosis in setting of ICH  Leukocytosis   PLAN  Repeat chest x-ray  Check be met and CBC with differential  Hospital day # [place] for Pager information 11/11/2015 9:08 AM      I have personally examined this patient, reviewed notes, independently viewed imaging studies, participated in medical decision making and plan of care. I have made any additions or clarifications directly to the above note. Agree with note above. Dr. Leonie Man  had a long discussion with the patient's wife at the bedside regarding his prognosis plan for care and answered questions. She agreed with inpatient rehabilitation. He recommended discontinuing SCDs and started Lovenox for DVT prevention. He also spoke to Dr. Joya Salm who plans to wean the ventriculostomy catheter over the next few days. Hopefully patient can be transferred to rehabilitation early part of next week  This patient is critically ill and at significant risk of neurological worsening, death and care requires constant monitoring of vital signs, hemodynamics,respiratory and cardiac monitoring, extensive review of multiple databases, frequent neurological assessment, discussion with family, other specialists and medical decision making of high complexity.I have made any additions or clarifications directly to the above note.This critical care time does not reflect procedure time, or teaching time or supervisory time of PA/NP/Med Resident etc but could involve care discussion time. I spent 30 minutes of neurocritical care time in the care of this patient.  Sarina Ill, MD Stroke Neurology [phone] [facility]         To contact Stroke Continuity provider, please refer to [email or web address] After hours, contact General Neurology

## 2015-11-11 NOTE — Progress Notes (Signed)
Patient ID: Jordan [name], male   DOB: 30-Aug-1946, 70 y.o.   MRN: [id number] Patient intermittently follows commands. Somewhat lethargic. Ventriculostomy patent at 15 cm draining 5-10 mL an hour

## 2015-11-12 LAB — URINALYSIS, ROUTINE W REFLEX MICROSCOPIC
BILIRUBIN URINE: NEGATIVE
GLUCOSE, UA: NEGATIVE mg/dL
KETONES UR: NEGATIVE mg/dL
Nitrite: POSITIVE — AB
PH: 5 (ref 5.0–8.0)
PROTEIN: 30 mg/dL — AB
Specific Gravity, Urine: 1.019 (ref 1.005–1.030)

## 2015-11-12 LAB — URINE MICROSCOPIC-ADD ON

## 2015-11-12 MED ORDER — PANTOPRAZOLE SODIUM 40 MG PO TBEC
40.0000 mg | DELAYED_RELEASE_TABLET | Freq: Every day | ORAL | Status: DC
  Administered 2015-11-12 – 2015-11-17 (×6): 40 mg via ORAL
  Filled 2015-11-12 (×6): qty 1

## 2015-11-12 MED ORDER — DEXTROSE 5 % IV SOLN
1.0000 g | INTRAVENOUS | Status: DC
  Administered 2015-11-12 – 2015-11-16 (×5): 1 g via INTRAVENOUS
  Filled 2015-11-12 (×6): qty 10

## 2015-11-12 MED ORDER — CETYLPYRIDINIUM CHLORIDE 0.05 % MT LIQD
7.0000 mL | Freq: Two times a day (BID) | OROMUCOSAL | Status: DC
  Administered 2015-11-12 – 2015-11-17 (×11): 7 mL via OROMUCOSAL

## 2015-11-12 NOTE — Progress Notes (Signed)
STROKE TEAM PROGRESS NOTE  HISTORY ON ADMISSION  Jordan [name] is a 70 y.o. male with a history of htn, recent NSTEMI in December who underwent CABG at that time. He was found to have Afib with RVR and was started on coumadin. He was doing well until last night, when he began to feel ill and have n/v. This AM, he was found to be confused and EMS was called. He was found to have a new peri-ventricular hemorrhage with large amounts of IVH.   He was initially following commands when he first presented, but has subsequently worsened and is no longer doing so.   ICH score: 2(though with IVH, may be > 30cc which would be a 3)  LKW: 1/14  tpa given?: no, hemorrhage    SUBJECTIVE (INTERVAL HISTORY) No family is at the bedside. He is more alert and interactive today. Urine culture positive, had a Tmax of 101 overnight.  IVC at 20cmh20. And draining 75ml/hr.     OBJECTIVE Temp:  [98.1 F (36.7 C)-101 F (38.3 C)] 98.5 F (36.9 C) (01/22 0800) Pulse Rate:  [99-117] 107 (01/22 0825) Cardiac Rhythm:  [-] Normal sinus rhythm;Other (Comment) (01/22 0800) Resp:  [12-42] 38 (01/22 0825) BP: (90-160)/(69-109) 150/97 mmHg (01/22 0825) SpO2:  [84 %-100 %] 99 % (01/22 0825) Weight:  [91.8 kg (202 lb 6.1 oz)] 91.8 kg (202 lb 6.1 oz) (01/22 0500)  CBC:   Recent Labs Lab 11/08/15 0530 11/11/15 1056  WBC 15.0* 14.5*  NEUTROABS  --  11.7*  HGB 9.8* 10.7*  HCT 32.0* 34.4*  MCV 87.0 84.9  PLT 452* 531*    Basic Metabolic Panel:   Recent Labs Lab 11/07/15 0539 11/08/15 0530 11/11/15 1056  NA 144 147* 141  K 2.8* 3.7 4.0  CL 107 112* 107  CO2 GLUCOSE 104* 111* 112*  BUN 14 13 22*  CREATININE 1.84* 1.66* 1.67*  CALCIUM 8.8* 9.1 9.1  MG 2.0 2.2  --   PHOS 4.1 3.4  --     Lipid Panel:     Component Value Date/Time   TRIG 136 11/05/2015 1650   HgbA1c:  Lab Results  Component Value Date   HGBA1C 5.6 10/12/2015   Urine Drug Screen:     Component Value Date/Time    LABOPIA NONE DETECTED 11/05/2015 0751   COCAINSCRNUR NONE DETECTED 11/05/2015 0751   LABBENZ NONE DETECTED 11/05/2015 0751   AMPHETMU NONE DETECTED 11/05/2015 0751   THCU NONE DETECTED 11/05/2015 0751   LABBARB NONE DETECTED 11/05/2015 0751      IMAGING   Ct Head Wo Contrast 11/09/2015  Evolving LEFT cerebral intraparenchymal hematoma with intraventricular extension. Stable position of RIGHT frontal ventriculostomy catheter, similar mild hydrocephalus, decreasing LEFT ventricular entrapment. Similar 5 mm LEFT-to-RIGHT midline shift, and mild LEFT uncal Herniation.   11/07/2015    Evolving LEFT cerebral hematoma with intraventricular extension. Stable position of RIGHT frontal ventriculostomy catheter with resolving hydrocephalus, decreasing LEFT ventricular entrapment. 5 mm LEFT-to-RIGHT midline shift, improved.   11/06/2015    Evolving LEFT intraparenchymal hematoma with intraventricular extension. Interval placement of RIGHT frontal ventriculostomy catheter with resolving hydrocephalus, however there is persistent LEFT temporal horn entrapment. 7 mm LEFT-to-RIGHT midline shift is likely postprocedural. New small amount of RIGHT frontal extra-axial pneumocephalus.   11/05/2015   1. Increase in volume of hemorrhage within the LEFT lateral ventricle anomaly within the LEFT atria and to lesser degree the temporal horn. 2. Increase in parenchymal edema surrounding the LEFT  lateral ventricle hemorrhage. 3. No midline shift.  Basilar cisterns are patent. 4. No change in volume of hemorrhage within the third ventricle and fourth ventricle and anterior ventricles.   11/05/2015    New acute parenchymal hemorrhage abutting the atria of the left lateral ventricle measuring approximate 1.6 x 2.6 cm with mild adjacent edema. Hemorrhage extends into the ventricular system as described. Chronic ischemic microvascular disease. Small old left-sided lacunar infar     Dg Chest Port 1  View   11/11/2015 Endotracheal tube and NG tube has been removed. Improvement in aeration left base. Minimal residual streaky atelectasis or scarring. No new infiltrate or pulmonary edema.   11/07/2015  1. Lines and tubes in stable position. 2. Prior CABG. Stable cardiomegaly. No pulmonary venous congestion 3. Low volumes with persistent bibasilar atelectasis. Persistent left lower lobe infiltrate noted.  11/06/2015   1. Lines and tubes in stable position. 2. Heart size stable. No pulmonary venous congestion on today's exam. 3. Persistent but improving left lower lobe infiltrate/edema. Low lung volumes. Small left pleural effusion cannot be excluded.   11/05/2015   Hypoinflation with findings suggesting mild vascular congestion. Tubes and lines as described. Interval placement left IJ central venous catheter with tip over the SVC. No pneumothorax.   11/05/2015    Endotracheal tube and nasogastric tube in appropriate position. Improved aeration with decreased bibasilar atelectasis.   11/05/2015    Hypo inflation with mild opacification of the medial left base which may be due to effusions/atelectasis although cannot exclude infection. Suggestion of minimal vascular congestion.    PHYSICAL EXAM Elderly Caucasian male   . Afebrile. Head is s/p EVD.Marland Kitchen Neck is supple without bruit.    Cardiac exam no murmur or gallop. Lungs are clear to auscultation. Distal pulses are well felt. Neurological Exam :  Patient is more alert today. He is speaking more however he is severely aphasic. He waves at me with his left arm when I say hello, nods y/n, follows simple command. Left gaze preference. Blinks to threat on the left but not on the right. Pupils irregular but reactive. Fundi were not visualized. Vision acuity cannot be reliably tested. Mild right lower facial weakness. Tongue midline. Motor system exam antigravity and normal strength on the left side. Right hemiplegia with grade 1-2/5  strength on the  right. Diminished on the right. Sensation appear diminished on the right. Right plantar upgoing left downgoing. Gait was not tested.   ASSESSMENT/PLAN Mr. Jordan [name] is a 70 y.o. male with history of hypertension, recent NSTEMI status post CABG in December, AFIB w/ RVR on coumadin presenting with confusion. CT show a periventricular hemorrhage with IVH.  Stroke:  left parietal intraparenchymal hemorrhage with intraventricular extension and increase in hemorrhage size since admission. Hydrocephalus. Cerebral edema. Uncal herniation. Hemorrhage secondary to warfarin related coagulopathy  Resultant  R HP, expressive aphasia, follows one-step commands  IVC insertion R frontal ([place] 1/15). Normal  Drainage. stable  CT Still with stable edema, shift. Decreasing left ventricular entrapment  CL placed for possible 3%, ok to d/c  HgbA1c 5.6 in Dec  SCDs for VTE prophylaxis. Now on Lovenox. DIET DYS 3 Room service appropriate?: Yes; Fluid consistency:: Thin  aspirin 81 mg daily and warfarin daily prior to admission. No antithrombotic's at this time secondary to hemorrhage.  Ongoing aggressive stroke risk factor management  Continue ICU level care given IVC  Therapy recommendations:  CIR (previous notes indicate wife wants SNF, unsure her understanding, RN will address with wife)  Disposition:  pending    Acute respiratory failure, resolved  Intubated in the ED after neurologic worsening  Extubated 11/07/15  Persistent left lower lobe infiltrate on chest x-ray 11/07/2015 - now resolved   Atrial Fibrillation  Home anticoagulation:  Warfarin and aspirin 81 mg.  INR 2.81 on admission  Rapid reversal protocol due to IVH  INR now 1.16  Consider aspirin after 1 week and NOAC after 1 month if hemorrhage remains stable  Hypertension  Stable, low at times  Hyperlipidemia  Home meds:  Lipitor 10  Resume lipitor at discharge  Other Stroke Risk Factors  Advanced  age  Former Cigarette smoker, quit smoking 32 years ago   Obesity, Body mass index is 29.87 kg/(m^2).   Coronary artery disease - NSTEMI s/p CABG 12/16  Obstructive sleep apnea  Other Active Problems  Bipolar d/o  Stage III chronic kidney disease, baseline creatinine 1.8  Hypokalemia, repleted. CCM following and addressing  Anemia of critical illness  Lactic acidosis in setting of ICH  Leukocytosis - 14.5 on Sunday  UTI - day #1 IV Rocephin - culture pending - adjust antibiotics as appropriate.   PLAN  Repeat chest x-ray - improved  Bmet and CBC with differential (chronic kidney disease) Sunday - reviewed  Hospital day # 7  [facility] See Amion for Pager information 11/12/2015 11:15 AM      I have personally examined this patient, reviewed notes, independently viewed imaging studies, participated in medical decision making and plan of care. I have made any additions or clarifications directly to the above note. Agree with note above. Dr. Pearlean Brownie  had a long discussion with the patient's wife at the bedside regarding his prognosis plan for care and answered questions, family has not been at the bedside over the weekend. She agreed with inpatient rehabilitation.  Hopefully patient can be transferred to rehabilitation early part of next week. IVC at .  IVC at 20 cm h20. And draining 16ml/hr. Plans are to wean over the next few days.  This patient is critically ill and at significant risk of neurological worsening, death and care requires constant monitoring of vital signs, hemodynamics,respiratory and cardiac monitoring, extensive review of multiple databases, frequent neurological assessment, discussion with family, other specialists and medical decision making of high complexity.I have made any additions or clarifications directly to the above note.This critical care time does not reflect procedure time, or teaching time or supervisory time of  PA/NP/Med Resident etc but could involve care discussion time. I spent 30 minutes of neurocritical care time in the care of this patient.  Naomie Dean, MD Stroke Neurology [phone] [facility]         To contact Stroke Continuity provider, please refer to [email or web address]. After hours, contact General Neurology

## 2015-11-12 NOTE — Progress Notes (Signed)
Patient ID: Jordan [name], male   DOB: [birth year], 70 y.o.   MRN: [id number] No change in neurologic exam. He may be a little more alert and cooperative. Ventriculostomy is patent and draining at 15 cm. I will raise this to 20 cm in order to try to wean it off.

## 2015-11-13 MED ORDER — FUROSEMIDE 40 MG PO TABS
40.0000 mg | ORAL_TABLET | Freq: Every day | ORAL | Status: DC
  Administered 2015-11-13 – 2015-11-17 (×5): 40 mg via ORAL
  Filled 2015-11-13 (×5): qty 1

## 2015-11-13 NOTE — Progress Notes (Signed)
Physical Therapy Treatment Patient Details Name: Jordan [name] MRN: [id number] DOB: 01/14/1946 Today's Date: 11/13/2015    History of Present Illness pt presents with L PArietal Intraparenchymal Hemmorhage with Intraventricular extension, Intubated 1/15-1/17, Cerebral Edema, and Hydrocephalus.  pt with hx of recent NSTEMI, CABG, MVR, and TVR.  pt PMH of HTN, Bipolar, Anxiety, CKD, and CAD.      PT Comments    Pt with increased lethargy today and RN indicates pt had a restless night last night.  Pt did still participate with mobility, but needed increased A to complete mobility.  Continue to feel pt would be appropriate for CIR at D/C to maximize independence.    Follow Up Recommendations  CIR     Equipment Recommendations  None recommended by PT    Recommendations for Other Services       Precautions / Restrictions Precautions Precautions: Fall Precaution Comments: IVC clamped by RN prior to session. Restrictions Weight Bearing Restrictions: No    Mobility  Bed Mobility Overal bed mobility: Needs Assistance;+2 for physical assistance Bed Mobility: Supine to Sit     Supine to sit: Max assist;+2 for physical assistance;HOB elevated     General bed mobility comments: pt needed increased A today due to increased lethargy.    Transfers Overall transfer level: Needs assistance Equipment used: 2 person hand held assist Transfers: Sit to/from [employer] Sit to Stand: Max assist;+2 physical assistance Stand pivot transfers: Max assist;+2 physical assistance       General transfer comment: pt more letharguc and leaning anteriorly on PT, but did still attempt to take steps towards recliner, just with mroe cueing and A.    Ambulation/Gait                 Stairs            Wheelchair Mobility    Modified Rankin (Stroke Patients Only) Modified Rankin (Stroke Patients Only) Pre-Morbid Rankin Score: Slight disability Modified Rankin: Severe  disability     Balance Overall balance assessment: Needs assistance Sitting-balance support: Feet supported;Bilateral upper extremity supported;Single extremity supported Sitting balance-Leahy Scale: Poor     Standing balance support: No upper extremity supported;During functional activity Standing balance-Leahy Scale: Poor                      Cognition Arousal/Alertness: Lethargic Behavior During Therapy: Flat affect Overall Cognitive Status: Difficult to assess Area of Impairment: Following commands       Following Commands: Follows one step commands inconsistently       General Comments: pt with increased lethargy today and increased difficulty participating in tasks.      Exercises      General Comments        Pertinent Vitals/Pain Pain Assessment: Faces (Simultaneous filing. User may not have seen previous data.) Faces Pain Scale: Hurts little more (Simultaneous filing. User may not have seen previous data.) Pain Location: Grimaces with bed mobility. Pain Descriptors / Indicators: Grimacing;Guarding Pain Intervention(s): Monitored during session;Premedicated before session;Repositioned    Home Living                      Prior Function            PT Goals (current goals can now be found in the care plan section) Acute Rehab PT Goals Patient Stated Goal: pt unable to state. PT Goal Formulation: Patient unable to participate in goal setting Time For Goal Achievement: 11/21/15 Potential to  Achieve Goals: Good Progress towards PT goals: Progressing toward goals    Frequency  Min 3X/week    PT Plan Current plan remains appropriate    Co-evaluation             End of Session Equipment Utilized During Treatment: Gait belt Activity Tolerance: Patient limited by lethargy Patient left: in chair;with call bell/phone within reach;with chair alarm set;with restraints reapplied;with family/visitor present     Time: 1610-9604 PT Time  Calculation (min) (ACUTE ONLY): 26 min  Charges:  $Therapeutic Activity: 23-37 mins                    G CodesSunny [name], [place] [phone] 11/13/2015, 11:00 AM

## 2015-11-13 NOTE — Progress Notes (Signed)
Rehab admissions - Noted plans to clamp IVC drain tomorrow.  I will follow up after IVC drain is discontinued.  Not ready for inpatient rehab admission today.  Call me for questions.  #[phone]

## 2015-11-13 NOTE — Progress Notes (Signed)
CIR continuing to follow for possible admission once EVD is discontinued.  Will follow for discharge planning.    Quintella Baton, RN, BSN  Trauma/Neuro ICU Case Manager [phone]

## 2015-11-13 NOTE — Progress Notes (Signed)
Speech Language Pathology Treatment: Dysphagia;Cognitive-Linquistic  Patient Details Name: Jordan [name] MRN: [id number] DOB: Feb 09, 1946 Today's Date: 11/13/2015 Time: 4098-1191 SLP Time Calculation (min) (ACUTE ONLY): 30 min  Assessment / Plan / Recommendation Clinical Impression  Patient able to self feed thin liquids today with minimal overt s/s of aspiration characterized by intermittent throat clearing and 1x strong immediate cough response. Suspect delay in swallow initiation in response to large cup sips. SLP provided min verbal and tactile cueing for small single sips as patient mildly impulsive with liquids, refusing solids today. Per ex-wife, patient consumed breakfast well this am with ability to self feed liquids only and without overt indication of aspiration when questioned. From a pulmonary standpoint, appears to be tolerating current diet well.   Aphasia treatment focused on facilitation of communication of basic needs/wants. Patient largely non-verbal today with 2 spontaneous utterances at the phrase/sentence level, incomprehensible with paraphasias and neologisms despite SLP cues for repetition and clarification. Patient with intact head nod in response to basic biographical Y/N questions and those related to functional task at hand in 75% of opportunities. Patient able to follow basic 1-step commands with max tactile cueing, improving to moderate cues in the context of a functional task. Overall, making good progress with functional goals.    HPI HPI: 70 y.o. male with history of hypertension, recent NSTEMI status post CABG in December, AFIB w/ RVR on coumadin presenting with confusion on 1/15. CT shows a left parietal intraparenchymal hemorrhage with intraventricular extension.  IVC right frontal 1/15.  CT 1/17 still with significant edema with 7 mm shift.  Intubated 1/15-1/17      SLP Plan  Continue with current plan of care     Recommendations  Diet recommendations:  Dysphagia 3 (mechanical soft);Thin liquid Liquids provided via: Cup;No straw Medication Administration: Whole meds with puree Supervision: Full supervision/cueing for compensatory strategies;Staff to assist with self feeding Compensations: Slow rate;Small sips/bites Postural Changes and/or Swallow Maneuvers: Seated upright 90 degrees;Upright 30-60 min after meal             Oral Care Recommendations: Oral care BID Follow up Recommendations: Inpatient Rehab Plan: Continue with current plan of care     GO             [facility] MA, CCC-SLP [phone]    Jordan [name] 11/13/2015, 10:59 AM

## 2015-11-13 NOTE — Progress Notes (Signed)
STROKE TEAM PROGRESS NOTE  HISTORY ON ADMISSION  Jordan [name] is a 70 y.o. male with a history of htn, recent NSTEMI in December who underwent CABG at that time. He was found to have Afib with RVR and was started on coumadin. He was doing well until last night, when he began to feel ill and have n/v. This AM, he was found to be confused and EMS was called. He was found to have a new peri-ventricular hemorrhage with large amounts of IVH.   He was initially following commands when he first presented, but has subsequently worsened and is no longer doing so.   ICH score: 2(though with IVH, may be > 30cc which would be a 3)  LKW: 1/14  tpa given?: no, hemorrhage    SUBJECTIVE (INTERVAL HISTORY) Wife is at the bedside. He remains alert and interactive today.Neurosurgery plan to clamp ventric tomorrow. Stable over weekend.     OBJECTIVE Temp:  [97.2 F (36.2 C)-99.2 F (37.3 C)] 97.2 F (36.2 C) (01/23 1200) Pulse Rate:  [38-112] 83 (01/23 1300) Cardiac Rhythm:  [-] Normal sinus rhythm;Sinus tachycardia (01/23 0600) Resp:  [16-43] 33 (01/23 1300) BP: (113-174)/(58-136) 131/98 mmHg (01/23 1300) SpO2:  [74 %-100 %] 99 % (01/23 1300) Weight:  [199 lb 15.3 oz (90.7 kg)] 199 lb 15.3 oz (90.7 kg) (01/23 0600)  CBC:   Recent Labs Lab 11/08/15 0530 11/11/15 1056  WBC 15.0* 14.5*  NEUTROABS  --  11.7*  HGB 9.8* 10.7*  HCT 32.0* 34.4*  MCV 87.0 84.9  PLT 452* 531*    Basic Metabolic Panel:   Recent Labs Lab 11/07/15 0539 11/08/15 0530 11/11/15 1056  NA 144 147* 141  K 2.8* 3.7 4.0  CL 107 112* 107  CO2 GLUCOSE 104* 111* 112*  BUN 14 13 22*  CREATININE 1.84* 1.66* 1.67*  CALCIUM 8.8* 9.1 9.1  MG 2.0 2.2  --   PHOS 4.1 3.4  --     Lipid Panel:     Component Value Date/Time   TRIG 136 11/05/2015 1650   HgbA1c:  Lab Results  Component Value Date   HGBA1C 5.6 10/12/2015   Urine Drug Screen:     Component Value Date/Time   LABOPIA NONE DETECTED  11/05/2015 0751   COCAINSCRNUR NONE DETECTED 11/05/2015 0751   LABBENZ NONE DETECTED 11/05/2015 0751   AMPHETMU NONE DETECTED 11/05/2015 0751   THCU NONE DETECTED 11/05/2015 0751   LABBARB NONE DETECTED 11/05/2015 0751      IMAGING   Ct Head Wo Contrast 11/09/2015  Evolving LEFT cerebral intraparenchymal hematoma with intraventricular extension. Stable position of RIGHT frontal ventriculostomy catheter, similar mild hydrocephalus, decreasing LEFT ventricular entrapment. Similar 5 mm LEFT-to-RIGHT midline shift, and mild LEFT uncal Herniation.   11/07/2015    Evolving LEFT cerebral hematoma with intraventricular extension. Stable position of RIGHT frontal ventriculostomy catheter with resolving hydrocephalus, decreasing LEFT ventricular entrapment. 5 mm LEFT-to-RIGHT midline shift, improved.   11/06/2015    Evolving LEFT intraparenchymal hematoma with intraventricular extension. Interval placement of RIGHT frontal ventriculostomy catheter with resolving hydrocephalus, however there is persistent LEFT temporal horn entrapment. 7 mm LEFT-to-RIGHT midline shift is likely postprocedural. New small amount of RIGHT frontal extra-axial pneumocephalus.   11/05/2015   1. Increase in volume of hemorrhage within the LEFT lateral ventricle anomaly within the LEFT atria and to lesser degree the temporal horn. 2. Increase in parenchymal edema surrounding the LEFT lateral ventricle hemorrhage. 3. No midline shift.  Basilar cisterns  are patent. 4. No change in volume of hemorrhage within the third ventricle and fourth ventricle and anterior ventricles.   11/05/2015    New acute parenchymal hemorrhage abutting the atria of the left lateral ventricle measuring approximate 1.6 x 2.6 cm with mild adjacent edema. Hemorrhage extends into the ventricular system as described. Chronic ischemic microvascular disease. Small old left-sided lacunar infar     Dg Chest Port 1 View   11/11/2015 Endotracheal tube and NG  tube has been removed. Improvement in aeration left base. Minimal residual streaky atelectasis or scarring. No new infiltrate or pulmonary edema.   11/07/2015  1. Lines and tubes in stable position. 2. Prior CABG. Stable cardiomegaly. No pulmonary venous congestion 3. Low volumes with persistent bibasilar atelectasis. Persistent left lower lobe infiltrate noted.  11/06/2015   1. Lines and tubes in stable position. 2. Heart size stable. No pulmonary venous congestion on today's exam. 3. Persistent but improving left lower lobe infiltrate/edema. Low lung volumes. Small left pleural effusion cannot be excluded.   11/05/2015   Hypoinflation with findings suggesting mild vascular congestion. Tubes and lines as described. Interval placement left IJ central venous catheter with tip over the SVC. No pneumothorax.   11/05/2015    Endotracheal tube and nasogastric tube in appropriate position. Improved aeration with decreased bibasilar atelectasis.   11/05/2015    Hypo inflation with mild opacification of the medial left base which may be due to effusions/atelectasis although cannot exclude infection. Suggestion of minimal vascular congestion.    PHYSICAL EXAM Elderly Caucasian male   . Afebrile. Head is s/p EVD.Marland Kitchen Neck is supple without bruit.    Cardiac exam no murmur or gallop. Lungs are clear to auscultation. Distal pulses are well felt. Neurological Exam :  Patient is alert today. He is speaking more however he is severely aphasic. He waves   with his left arm  And , follows simple command. Left gaze preference. Blinks to threat on the left but not on the right. Pupils irregular but reactive. Fundi were not visualized. Vision acuity cannot be reliably tested. Mild right lower facial weakness. Tongue midline. Motor system exam antigravity and normal strength on the left side. Right hemiplegia with grade 1-2/5  strength on the right. Diminished on the right. Sensation appear diminished on the right.  Right plantar upgoing left downgoing. Gait was not tested.   ASSESSMENT/PLAN Mr. Jordan [name] is a 70 y.o. male with history of hypertension, recent NSTEMI status post CABG in December, AFIB w/ RVR on coumadin presenting with confusion. CT show a periventricular hemorrhage with IVH.  Stroke:  left parietal intraparenchymal hemorrhage with intraventricular extension and increase in hemorrhage size since admission. Hydrocephalus. Cerebral edema. Uncal herniation. Hemorrhage secondary to warfarin related coagulopathy  Resultant  R HP, expressive aphasia, follows one-step commands  IVC insertion R frontal ([place]ero 1/15). Normal  Drainage. stable  CT Still with stable edema, shift. Decreasing left ventricular entrapment  CL placed for possible 3%, ok to d/c  HgbA1c 5.6 in Dec  SCDs for VTE prophylaxis. Now on Lovenox. DIET DYS 3 Room service appropriate?: Yes; Fluid consistency:: Thin  aspirin 81 mg daily and warfarin daily prior to admission. No antithrombotic's at this time secondary to hemorrhage.  Ongoing aggressive stroke risk factor management  Continue ICU level care given IVC  Therapy recommendations:  CIR (previous notes indicate wife wants SNF, unsure her understanding, RN will address with wife)  Disposition:  pending    Acute respiratory failure, resolved  Intubated in  the ED after neurologic worsening  Extubated 11/07/15  Persistent left lower lobe infiltrate on chest x-ray 11/07/2015 - now resolved   Atrial Fibrillation  Home anticoagulation:  Warfarin and aspirin 81 mg.  INR 2.81 on admission  Rapid reversal protocol due to IVH  INR now 1.16  Consider aspirin after 1 week and NOAC after 1 month if hemorrhage remains stable  Hypertension  Stable, low at times  Hyperlipidemia  Home meds:  Lipitor 10  Resume lipitor at discharge  Other Stroke Risk Factors  Advanced age  Former Cigarette smoker, quit smoking 32 years ago   Obesity, Body mass  index is 29.52 kg/(m^2).   Coronary artery disease - NSTEMI s/p CABG 12/16  Obstructive sleep apnea  Other Active Problems  Bipolar d/o  Stage III chronic kidney disease, baseline creatinine 1.8  Hypokalemia, repleted. CCM following and addressing  Anemia of critical illness  Lactic acidosis in setting of ICH  Leukocytosis - 14.5 on Sunday  UTI - day #1 IV Rocephin - culture pending - adjust antibiotics as appropriate.   PLAN  Repeat chest x-ray - improved  Bmet and CBC with differential (chronic kidney disease) Sunday - reviewed  Hospital day # 8      I have personally examined this patient, reviewed notes, independently viewed imaging studies, participated in medical decision making and plan of care. I have made any additions or clarifications directly to the above note.  . Dr. Pearlean Brownie  had a long discussion with the patient's wife at the bedside regarding his prognosis plan for care and answered questions, Wife agrees to   inpatient rehabilitation.     This patient is critically ill and at significant risk of neurological worsening, death and care requires constant monitoring of vital signs, hemodynamics,respiratory and cardiac monitoring, extensive review of multiple databases, frequent neurological assessment, discussion with family, other specialists and medical decision making of high complexity.I have made any additions or clarifications directly to the above note.This critical care time does not reflect procedure time, or teaching time or supervisory time of PA/NP/Med Resident etc but could involve care discussion time. I spent 32 minutes of neurocritical care time in the care of this patient.  Delia Heady,, MD Stroke Neurology   [facility]         To contact Stroke Continuity provider, please refer to [email or web address]. After hours, contact General Neurology

## 2015-11-13 NOTE — Progress Notes (Signed)
Patient ID: Jordan [name] male   DOB: Mar 13, 1946, 70 y.o.   MRN: [id number] Clamp the drain in am. Neuro stable

## 2015-11-13 NOTE — Plan of Care (Signed)
Problem: Self-Care: Goal: Ability to participate in self-care as condition permits will improve Outcome: Progressing Pt assisting some w/ self feedings

## 2015-11-14 LAB — URINE CULTURE: SPECIAL REQUESTS: NORMAL

## 2015-11-14 LAB — BASIC METABOLIC PANEL
Anion gap: 13 (ref 5–15)
BUN: 36 mg/dL — AB (ref 6–20)
CALCIUM: 8.8 mg/dL — AB (ref 8.9–10.3)
CHLORIDE: 104 mmol/L (ref 101–111)
CO2: 21 mmol/L — AB (ref 22–32)
CREATININE: 2.04 mg/dL — AB (ref 0.61–1.24)
GFR calc non Af Amer: 31 mL/min — ABNORMAL LOW (ref >60)
GFR, EST AFRICAN AMERICAN: 36 mL/min — AB (ref >60)
Glucose, Bld: 167 mg/dL — ABNORMAL HIGH (ref 65–99)
Potassium: 3.8 mmol/L (ref 3.5–5.1)
SODIUM: 138 mmol/L (ref 135–145)

## 2015-11-14 LAB — MAGNESIUM: Magnesium: 2.4 mg/dL (ref 1.7–2.4)

## 2015-11-14 NOTE — Progress Notes (Signed)
Occupational Therapy Treatment Patient Details Name: Jordan [name] MRN: [id number] DOB: [birth year] Today's Date: 11/14/2015    History of present illness pt presents with L PArietal Intraparenchymal Hemmorhage with Intraventricular extension, Intubated 1/15-1/17, Cerebral Edema, and Hydrocephalus.  pt with hx of recent NSTEMI, CABG, MVR, and TVR.  pt PMH of HTN, Bipolar, Anxiety, CKD, and CAD.     OT comments  This 70 yo male admitted with above presents to acute OT not making progress today due to lethargy. He will continue to benefit from acute OT with follow up OT on CIR to increase independence and decrease burden of care.  Follow Up Recommendations  CIR;Supervision/Assistance - 24 hour    Equipment Recommendations  3 in 1 bedside comode    Recommendations for Other Services Rehab consult    Precautions / Restrictions Precautions Precautions: Fall Precaution Comments: IVC clamped by RN prior to session. Restrictions Weight Bearing Restrictions: No       Mobility Bed Mobility Overal bed mobility: Needs Assistance Bed Mobility: Rolling;Sidelying to Sit Rolling:  (min A to his left, max A to his right (with some resistance)) Sidelying to sit: +2 for physical assistance;Total assist          Transfers Overall transfer level: Needs assistance Equipment used: 2 person hand held assist (use of belt) Transfers: Sit to/from Stand;Stand Pivot Transfers Sit to Stand: Max assist;+2 physical assistance Stand pivot transfers: Max assist;+2 physical assistance       General transfer comment: pt stood with max A +2 for about 1 minute for nurse to clean his back peri area (we had already cleaned him up before OOB, but he had stooled again)    Balance Overall balance assessment: Needs assistance Sitting-balance support: Feet supported;Single extremity supported Sitting balance-Leahy Scale: Zero   Postural control: Posterior lean (only tried to correct once) Standing balance  support: No upper extremity supported Standing balance-Leahy Scale: Zero Standing balance comment: Pt came up to stand with Max A +2 with heavy lean to left                   ADL Overall ADL's : Needs assistance/impaired                         Toilet Transfer: +2 for physical assistance;Maximal assistance;Stand-pivot ((raised)bed>recliner going to pt's left)   Toileting- Clothing Manipulation and Hygiene: Total assistance;Bed level                          Cognition   Behavior During Therapy: Flat affect Overall Cognitive Status: Difficult to assess Area of Impairment: Following commands;Attention;Safety/judgement   Current Attention Level: Focused    Following Commands: Follows one step commands inconsistently Safety/Judgement: Decreased awareness of safety;Decreased awareness of deficits     General Comments: pt with increased lethargy today and increased difficulty participating in tasks.                   Pertinent Vitals/ Pain       Pain Assessment: Faces Faces Pain Scale: No hurt         Frequency Min 2X/week     Progress Toward Goals  OT Goals(current goals can now be found in the care plan section)  Progress towards OT goals: Not progressing toward goals - comment (lethargic today--RN reports he was this way yesterday as well)     Plan Discharge plan remains appropriate  End of Session Equipment Utilized During Treatment: Gait belt   Activity Tolerance Patient limited by lethargy   Patient Left in chair;with chair alarm set   Nurse Communication Mobility status (nurse in room when we got him to recliner)        Time: 5409-8119 OT Time Calculation (min): 37 min  Charges: OT General Charges $OT Visit: 1 Procedure OT Treatments $Therapeutic Activity: 23-37 mins  Evette Georges [phone] 11/14/2015, 1:56 PM

## 2015-11-14 NOTE — Progress Notes (Signed)
STROKE TEAM PROGRESS NOTE  HISTORY ON ADMISSION  Jordan [name] is a 70 y.o. male with a history of htn, recent NSTEMI in December who underwent CABG at that time. He was found to have Afib with RVR and was started on coumadin. He was doing well until last night, when he began to feel ill and have n/v. This AM, he was found to be confused and EMS was called. He was found to have a new peri-ventricular hemorrhage with large amounts of IVH.   He was initially following commands when he first presented, but has subsequently worsened and is no longer doing so.   ICH score: 2(though with IVH, may be > 30cc which would be a 3)  LKW: 1/14  tpa given?: no, hemorrhage    SUBJECTIVE (INTERVAL HISTORY) Wife is at the bedside. He remains alert and interactive today.Neurosurgery plan to clamp ventric today. Stable overnight   OBJECTIVE Temp:  [97.8 F (36.6 C)-98.8 F (37.1 C)] 98.2 F (36.8 C) (01/24 1146) Pulse Rate:  [32-111] 110 (01/24 1400) Cardiac Rhythm:  [-] Sinus tachycardia (01/24 0739) Resp:  [11-33] 28 (01/24 1400) BP: (97-158)/(58-143) 119/95 mmHg (01/24 1400) SpO2:  [69 %-100 %] 100 % (01/24 1400) Weight:  [195 lb 15.8 oz (88.9 kg)] 195 lb 15.8 oz (88.9 kg) (01/24 0700)  CBC:   Recent Labs Lab 11/08/15 0530 11/11/15 1056  WBC 15.0* 14.5*  NEUTROABS  --  11.7*  HGB 9.8* 10.7*  HCT 32.0* 34.4*  MCV 87.0 84.9  PLT 452* 531*    Basic Metabolic Panel:   Recent Labs Lab 11/08/15 0530 11/11/15 1056  NA 147* 141  K 3.7 4.0  CL 112* 107  CO2 24 23  GLUCOSE 111* 112*  BUN 13 22*  CREATININE 1.66* 1.67*  CALCIUM 9.1 9.1  MG 2.2  --   PHOS 3.4  --     Lipid Panel:     Component Value Date/Time   TRIG 136 11/05/2015 1650   HgbA1c:  Lab Results  Component Value Date   HGBA1C 5.6 10/12/2015   Urine Drug Screen:     Component Value Date/Time   LABOPIA NONE DETECTED 11/05/2015 0751   COCAINSCRNUR NONE DETECTED 11/05/2015 0751   LABBENZ NONE DETECTED  11/05/2015 0751   AMPHETMU NONE DETECTED 11/05/2015 0751   THCU NONE DETECTED 11/05/2015 0751   LABBARB NONE DETECTED 11/05/2015 0751      IMAGING   Ct Head Wo Contrast 11/09/2015  Evolving LEFT cerebral intraparenchymal hematoma with intraventricular extension. Stable position of RIGHT frontal ventriculostomy catheter, similar mild hydrocephalus, decreasing LEFT ventricular entrapment. Similar 5 mm LEFT-to-RIGHT midline shift, and mild LEFT uncal Herniation.   11/07/2015    Evolving LEFT cerebral hematoma with intraventricular extension. Stable position of RIGHT frontal ventriculostomy catheter with resolving hydrocephalus, decreasing LEFT ventricular entrapment. 5 mm LEFT-to-RIGHT midline shift, improved.   11/06/2015    Evolving LEFT intraparenchymal hematoma with intraventricular extension. Interval placement of RIGHT frontal ventriculostomy catheter with resolving hydrocephalus, however there is persistent LEFT temporal horn entrapment. 7 mm LEFT-to-RIGHT midline shift is likely postprocedural. New small amount of RIGHT frontal extra-axial pneumocephalus.   11/05/2015   1. Increase in volume of hemorrhage within the LEFT lateral ventricle anomaly within the LEFT atria and to lesser degree the temporal horn. 2. Increase in parenchymal edema surrounding the LEFT lateral ventricle hemorrhage. 3. No midline shift.  Basilar cisterns are patent. 4. No change in volume of hemorrhage within the third ventricle and fourth ventricle and  anterior ventricles.   11/05/2015    New acute parenchymal hemorrhage abutting the atria of the left lateral ventricle measuring approximate 1.6 x 2.6 cm with mild adjacent edema. Hemorrhage extends into the ventricular system as described. Chronic ischemic microvascular disease. Small old left-sided lacunar infar     Dg Chest Port 1 View   11/11/2015 Endotracheal tube and NG tube has been removed. Improvement in aeration left base. Minimal residual streaky  atelectasis or scarring. No new infiltrate or pulmonary edema.   11/07/2015  1. Lines and tubes in stable position. 2. Prior CABG. Stable cardiomegaly. No pulmonary venous congestion 3. Low volumes with persistent bibasilar atelectasis. Persistent left lower lobe infiltrate noted.  11/06/2015   1. Lines and tubes in stable position. 2. Heart size stable. No pulmonary venous congestion on today's exam. 3. Persistent but improving left lower lobe infiltrate/edema. Low lung volumes. Small left pleural effusion cannot be excluded.   11/05/2015   Hypoinflation with findings suggesting mild vascular congestion. Tubes and lines as described. Interval placement left IJ central venous catheter with tip over the SVC. No pneumothorax.   11/05/2015    Endotracheal tube and nasogastric tube in appropriate position. Improved aeration with decreased bibasilar atelectasis.   11/05/2015    Hypo inflation with mild opacification of the medial left base which may be due to effusions/atelectasis although cannot exclude infection. Suggestion of minimal vascular congestion.    PHYSICAL EXAM Elderly Caucasian male   . Afebrile. Head is s/p EVD.Marland Kitchen Neck is supple without bruit.    Cardiac exam no murmur or gallop. Lungs are clear to auscultation. Distal pulses are well felt. Neurological Exam :  Patient is alert today. He is speaking more however he is severely aphasic. He speaks few words but mostly unintelligible speech and , follows simple command. Left gaze preference. Blinks to threat on the left but not on the right. Pupils irregular but reactive. Fundi were not visualized. Vision acuity cannot be reliably tested. Mild right lower facial weakness. Tongue midline. Motor system exam antigravity and normal strength on the left side. Right hemiplegia with grade 1-2/5  strength on the right. Diminished on the right. Sensation appear diminished on the right. Right plantar upgoing left downgoing. Gait was not  tested.   ASSESSMENT/PLAN Mr. Jordan [name] is a 70 y.o. male with history of hypertension, recent NSTEMI status post CABG in December, AFIB w/ RVR on coumadin presenting with confusion. CT show a periventricular hemorrhage with IVH.  Stroke:  left parietal intraparenchymal hemorrhage with intraventricular extension and increase in hemorrhage size since admission. Hydrocephalus. Cerebral edema. Uncal herniation. Hemorrhage secondary to warfarin related coagulopathy  Resultant  R HP, expressive aphasia, follows one-step commands  IVC insertion R frontal ([place] 1/15). Normal  Drainage. stable  CT Still with stable edema, shift. Decreasing left ventricular entrapment  CL placed for possible 3%, ok to d/c  HgbA1c 5.6 in Dec  SCDs for VTE prophylaxis. Now on Lovenox. DIET DYS 3 Room service appropriate?: Yes; Fluid consistency:: Thin  aspirin 81 mg daily and warfarin daily prior to admission. No antithrombotic's at this time secondary to hemorrhage.  Ongoing aggressive stroke risk factor management  Continue ICU level care given IVC  Therapy recommendations:  CIR (previous notes indicate wife wants SNF, unsure her understanding, RN will address with wife)  Disposition:  pending    Acute respiratory failure, resolved  Intubated in the ED after neurologic worsening  Extubated 11/07/15  Persistent left lower lobe infiltrate on chest x-ray 11/07/2015 -  now resolved   Atrial Fibrillation  Home anticoagulation:  Warfarin and aspirin 81 mg.  INR 2.81 on admission  Rapid reversal protocol due to IVH  INR now 1.16  Consider aspirin after 1 week and NOAC after 1 month if hemorrhage remains stable  Hypertension  Stable, low at times  Hyperlipidemia  Home meds:  Lipitor 10  Resume lipitor at discharge  Other Stroke Risk Factors  Advanced age  Former Cigarette smoker, quit smoking 32 years ago   Obesity, Body mass index is 28.93 kg/(m^2).   Coronary artery disease  - NSTEMI s/p CABG 12/16  Obstructive sleep apnea  Other Active Problems  Bipolar d/o  Stage III chronic kidney disease, baseline creatinine 1.8  Hypokalemia, repleted. CCM following and addressing  Anemia of critical illness  Lactic acidosis in setting of ICH  Leukocytosis - 14.5 on Sunday  UTI - day #1 IV Rocephin - culture pending - adjust antibiotics as appropriate.   PLAN  Repeat chest x-ray - improved  Bmet and CBC with differential (chronic kidney disease) Sunday - reviewed  Hospital day # 9      I have personally examined this patient, reviewed notes, independently viewed imaging studies, participated in medical decision making and plan of care. I have made any additions or clarifications directly to the above note.  . Dr. Pearlean Brownie  had a long discussion with the patient's wife at the bedside regarding his prognosis plan for care and answered questions, Wife agrees to   inpatient rehabilitation.     This patient is critically ill and at significant risk of neurological worsening, death and care requires constant monitoring of vital signs, hemodynamics,respiratory and cardiac monitoring, extensive review of multiple databases, frequent neurological assessment, discussion with family, other specialists and medical decision making of high complexity.I have made any additions or clarifications directly to the above note.This critical care time does not reflect procedure time, or teaching time or supervisory time of PA/NP/Med Resident etc but could involve care discussion time. I spent of neurocritical care time in the care of this patient. Delia Heady,, MD Stroke Neurology           To contact Stroke Continuity provider, please refer to [email or web address]. After hours, contact General Neurology

## 2015-11-14 NOTE — Progress Notes (Signed)
Drain currently at [employer]. Clamped per Dr. Cassandria Santee order.

## 2015-11-14 NOTE — Progress Notes (Signed)
Patient ID: Jordan [name], male   DOB: [birth year], 70 y.o.   MRN: [id number] Still draining at 15 cm heo. Plan to clamp IVC and see how he does

## 2015-11-14 NOTE — Progress Notes (Signed)
Rehab admissions - Observed patient eating with assistance of ex-wife.  Still with IVC drain.  Not ready for inpatient rehab yet.  Will need to seek authorization closer to when patient is medically ready for rehab.  Call me for questions.  #[phone]

## 2015-11-14 NOTE — Progress Notes (Signed)
Pt pulled off IVC dressing to head. OP site cleaned with ChloraPrep. Biopatch placed. Site redressed with Tegaderm. Danelle Berry, RN at bedside. Will continue to monitor.

## 2015-11-14 NOTE — Clinical Social Work Note (Signed)
CSW continues to follow patient for discharge related needs. Patient still has drain at this time. Once drain is removed CSW will initiate SNF search for patient as an alternative to CIR. CSW will continue to follow.   Roddie Mc MSW, [place], [place], [phone]

## 2015-11-15 ENCOUNTER — Inpatient Hospital Stay (HOSPITAL_COMMUNITY): Payer: PPO

## 2015-11-15 NOTE — Progress Notes (Signed)
Rehab admissions - I have faxed information to [facility] requesting acute inpatient rehab admission.  Patient may be ready for rehab tomorrow per unit case manager.  I will follow up after I hear back from insurance case manager.  Call me for questions.  #[phone]

## 2015-11-15 NOTE — Progress Notes (Signed)
Physical Therapy Treatment Patient Details Name: Jordan [name] MRN: [id number] DOB: 06-24-1946 Today's Date: 11/15/2015    History of Present Illness pt presents with L PArietal Intraparenchymal Hemmorhage with Intraventricular extension, Intubated 1/15-1/17, Cerebral Edema, and Hydrocephalus.  pt with hx of recent NSTEMI, CABG, MVR, and TVR.  pt PMH of HTN, Bipolar, Anxiety, CKD, and CAD.      PT Comments    Pt continues to be lethargic, but will participate with PT when provided strong stimuli to arouse him.  Pt with increased attempts at verbalization today, though much of it garbled.  Continue to feel pt would benefit from CIR at D/C to maximize independence and decrease burden of care.  Will continue to follow.    Follow Up Recommendations  CIR     Equipment Recommendations  None recommended by PT    Recommendations for Other Services       Precautions / Restrictions Precautions Precautions: Fall Precaution Comments: IVC clamped by RN prior to session. Restrictions Weight Bearing Restrictions: No    Mobility  Bed Mobility Overal bed mobility: Needs Assistance;+2 for physical assistance Bed Mobility: Supine to Sit     Supine to sit: Max assist;+2 for physical assistance     General bed mobility comments: pt needed movement initiated for him, then he did participate in coming to sitting EOB.    Transfers Overall transfer level: Needs assistance Equipment used: 2 person hand held assist Transfers: Sit to/from [employer] Sit to Stand: Max assist;+2 physical assistance Stand pivot transfers: Max assist;+2 physical assistance       General transfer comment: When given cues for coming to stand, pt does independently try to position his UEs to prepare for coming to stand and does participate in mobility.    Ambulation/Gait                 Stairs            Wheelchair Mobility    Modified Rankin (Stroke Patients Only)        Balance Overall balance assessment: Needs assistance Sitting-balance support: Single extremity supported;Feet supported Sitting balance-Leahy Scale: Poor Sitting balance - Comments: pt does attempt to maintain sitting balance when allowed to lean posteriorly he does attempt to correct.   Postural control: Posterior lean Standing balance support: During functional activity Standing balance-Leahy Scale: Poor                      Cognition Arousal/Alertness: Lethargic Behavior During Therapy: Flat affect Overall Cognitive Status: Difficult to assess Area of Impairment: Following commands;Attention;Safety/judgement   Current Attention Level: Focused   Following Commands: Follows one step commands inconsistently Safety/Judgement: Decreased awareness of safety;Decreased awareness of deficits     General Comments: pt lethargic, but does respond when stimulated.  pt does attempt more verbalizations today, but mostly garbled speech.      Exercises      General Comments        Pertinent Vitals/Pain Pain Assessment: Faces Faces Pain Scale: No hurt    Home Living                      Prior Function            PT Goals (current goals can now be found in the care plan section) Acute Rehab PT Goals Patient Stated Goal: pt unable to state. PT Goal Formulation: Patient unable to participate in goal setting Time For Goal Achievement: 11/21/15 Potential  to Achieve Goals: Good Progress towards PT goals: Progressing toward goals    Frequency  Min 3X/week    PT Plan Current plan remains appropriate    Co-evaluation             End of Session Equipment Utilized During Treatment: Gait belt Activity Tolerance: Patient limited by lethargy Patient left: in chair;with call bell/phone within reach;with chair alarm set;with restraints reapplied     Time: 0837-0902 PT Time Calculation (min) (ACUTE ONLY): 25 min  Charges:  $Therapeutic Activity: 23-37  mins                    G CodesSunny [name], Jordan [name] [phone] 11/15/2015, 11:35 AM

## 2015-11-15 NOTE — Progress Notes (Signed)
STROKE TEAM PROGRESS NOTE  HISTORY ON ADMISSION  Jordan [name] is a 70 y.o. male with a history of htn, recent NSTEMI in December who underwent CABG at that time. He was found to have Afib with RVR and was started on coumadin. He was doing well until last night, when he began to feel ill and have n/v. This AM, he was found to be confused and EMS was called. He was found to have a new peri-ventricular hemorrhage with large amounts of IVH.   He was initially following commands when he first presented, but has subsequently worsened and is no longer doing so.   ICH score: 2(though with IVH, may be > 30cc which would be a 3)  LKW: 1/14  tpa given?: no, hemorrhage    SUBJECTIVE (INTERVAL HISTORY) No family at bedside He remains alert and interactive today.Neurosurgery plan to  Dc ventric today. Stable overnight   OBJECTIVE Temp:  [97.3 F (36.3 C)-98 F (36.7 C)] 98 F (36.7 C) (01/25 1200) Pulse Rate:  [92-118] 118 (01/25 1400) Cardiac Rhythm:  [-] Sinus tachycardia (01/25 0800) Resp:  [15-40] 19 (01/25 1400) BP: (108-161)/(60-140) 124/89 mmHg (01/25 1400) SpO2:  [96 %-100 %] 100 % (01/25 1400) Weight:  [195 lb 8.8 oz (88.7 kg)] 195 lb 8.8 oz (88.7 kg) (01/25 0300)  CBC:   Recent Labs Lab 11/11/15 1056  WBC 14.5*  NEUTROABS 11.7*  HGB 10.7*  HCT 34.4*  MCV 84.9  PLT 531*    Basic Metabolic Panel:   Recent Labs Lab 11/11/15 1056 11/14/15 1440  NA 141 138  K 4.0 3.8  CL 107 104  CO2 23 21*  GLUCOSE 112* 167*  BUN 22* 36*  CREATININE 1.67* 2.04*  CALCIUM 9.1 8.8*  MG  --  2.4    Lipid Panel:     Component Value Date/Time   TRIG 136 11/05/2015 1650   HgbA1c:  Lab Results  Component Value Date   HGBA1C 5.6 10/12/2015   Urine Drug Screen:     Component Value Date/Time   LABOPIA NONE DETECTED 11/05/2015 0751   COCAINSCRNUR NONE DETECTED 11/05/2015 0751   LABBENZ NONE DETECTED 11/05/2015 0751   AMPHETMU NONE DETECTED 11/05/2015 0751   THCU NONE DETECTED  11/05/2015 0751   LABBARB NONE DETECTED 11/05/2015 0751      IMAGING   Ct Head Wo Contrast 11/09/2015  Evolving LEFT cerebral intraparenchymal hematoma with intraventricular extension. Stable position of RIGHT frontal ventriculostomy catheter, similar mild hydrocephalus, decreasing LEFT ventricular entrapment. Similar 5 mm LEFT-to-RIGHT midline shift, and mild LEFT uncal Herniation.   11/07/2015    Evolving LEFT cerebral hematoma with intraventricular extension. Stable position of RIGHT frontal ventriculostomy catheter with resolving hydrocephalus, decreasing LEFT ventricular entrapment. 5 mm LEFT-to-RIGHT midline shift, improved.   11/06/2015    Evolving LEFT intraparenchymal hematoma with intraventricular extension. Interval placement of RIGHT frontal ventriculostomy catheter with resolving hydrocephalus, however there is persistent LEFT temporal horn entrapment. 7 mm LEFT-to-RIGHT midline shift is likely postprocedural. New small amount of RIGHT frontal extra-axial pneumocephalus.   11/05/2015   1. Increase in volume of hemorrhage within the LEFT lateral ventricle anomaly within the LEFT atria and to lesser degree the temporal horn. 2. Increase in parenchymal edema surrounding the LEFT lateral ventricle hemorrhage. 3. No midline shift.  Basilar cisterns are patent. 4. No change in volume of hemorrhage within the third ventricle and fourth ventricle and anterior ventricles.   11/05/2015    New acute parenchymal hemorrhage abutting the atria of  the left lateral ventricle measuring approximate 1.6 x 2.6 cm with mild adjacent edema. Hemorrhage extends into the ventricular system as described. Chronic ischemic microvascular disease. Small old left-sided lacunar infar     Dg Chest Port 1 View   11/11/2015 Endotracheal tube and NG tube has been removed. Improvement in aeration left base. Minimal residual streaky atelectasis or scarring. No new infiltrate or pulmonary edema.   11/07/2015   1. Lines and tubes in stable position. 2. Prior CABG. Stable cardiomegaly. No pulmonary venous congestion 3. Low volumes with persistent bibasilar atelectasis. Persistent left lower lobe infiltrate noted.  11/06/2015   1. Lines and tubes in stable position. 2. Heart size stable. No pulmonary venous congestion on today's exam. 3. Persistent but improving left lower lobe infiltrate/edema. Low lung volumes. Small left pleural effusion cannot be excluded.   11/05/2015   Hypoinflation with findings suggesting mild vascular congestion. Tubes and lines as described. Interval placement left IJ central venous catheter with tip over the SVC. No pneumothorax.   11/05/2015    Endotracheal tube and nasogastric tube in appropriate position. Improved aeration with decreased bibasilar atelectasis.   11/05/2015    Hypo inflation with mild opacification of the medial left base which may be due to effusions/atelectasis although cannot exclude infection. Suggestion of minimal vascular congestion.    PHYSICAL EXAM Elderly Caucasian male   . Afebrile. Head is s/p EVD.Marland Kitchen Neck is supple without bruit.    Cardiac exam no murmur or gallop. Lungs are clear to auscultation. Distal pulses are well felt. Neurological Exam :  Patient is alert today. He is speaking more however he is severely aphasic. He speaks few words but mostly unintelligible speech and , follows simple command. Left gaze preference. Blinks to threat on the left but not on the right. Pupils irregular but reactive. Fundi were not visualized. Vision acuity cannot be reliably tested. Mild right lower facial weakness. Tongue midline. Motor system exam antigravity and normal strength on the left side. Right hemiplegia with grade 1-2/5  strength on the right. Diminished on the right. Sensation appear diminished on the right. Right plantar upgoing left downgoing. Gait was not tested.   ASSESSMENT/PLAN Mr. Jordan [name] is a 70 y.o. male with history of  hypertension, recent NSTEMI status post CABG in December, AFIB w/ RVR on coumadin presenting with confusion. CT show a periventricular hemorrhage with IVH.  Stroke:  left parietal intraparenchymal hemorrhage with intraventricular extension and increase in hemorrhage size since admission. Hydrocephalus. Cerebral edema. Uncal herniation. Hemorrhage secondary to warfarin related coagulopathy  Resultant  R HP, expressive aphasia, follows one-step commands  IVC insertion R frontal ([place] 1/15). Normal  Drainage. stable  CT Still with stable edema, shift. Decreasing left ventricular entrapment  CL placed for possible 3%, ok to d/c  HgbA1c 5.6 in Dec  SCDs for VTE prophylaxis. Now on Lovenox. DIET DYS 3 Room service appropriate?: Yes; Fluid consistency:: Thin  aspirin 81 mg daily and warfarin daily prior to admission. No antithrombotic's at this time secondary to hemorrhage.  Ongoing aggressive stroke risk factor management  Continue ICU level care given IVC  Therapy recommendations:  CIR (previous notes indicate wife wants SNF, unsure her understanding, RN will address with wife)  Disposition:  pending    Acute respiratory failure, resolved  Intubated in the ED after neurologic worsening  Extubated 11/07/15  Persistent left lower lobe infiltrate on chest x-ray 11/07/2015 - now resolved   Atrial Fibrillation  Home anticoagulation:  Warfarin and aspirin 81 mg.  INR 2.81 on admission  Rapid reversal protocol due to IVH  INR now 1.16  Consider aspirin after 1 week and NOAC after 1 month if hemorrhage remains stable  Hypertension  Stable, low at times  Hyperlipidemia  Home meds:  Lipitor 10  Resume lipitor at discharge  Other Stroke Risk Factors  Advanced age  Former Cigarette smoker, quit smoking 32 years ago   Obesity, Body mass index is 28.86 kg/(m^2).   Coronary artery disease - NSTEMI s/p CABG 12/16  Obstructive sleep apnea  Other Active  Problems  Bipolar d/o  Stage III chronic kidney disease, baseline creatinine 1.8  Hypokalemia, repleted. CCM following and addressing  Anemia of critical illness  Lactic acidosis in setting of ICH  Leukocytosis - 14.5 on Sunday  UTI - day #1 IV Rocephin - culture pending - adjust antibiotics as appropriate.   PLAN  Repeat chest x-ray - improved  Bmet and CBC with differential (chronic kidney disease) Sunday - reviewed  Hospital day # 10      I have personally examined this patient, reviewed notes, independently viewed imaging studies, participated in medical decision making and plan of care. I have made any additions or clarifications directly to the above note.  .Plan to discontinue ventriculostomy catheter today. If remains stable with transfer to the floor tomorrow and begin mobilization and rehabilitation    This patient is critically ill and at significant risk of neurological worsening, death and care requires constant monitoring of vital signs, hemodynamics,respiratory and cardiac monitoring, extensive review of multiple databases, frequent neurological assessment, discussion with family, other specialists and medical decision making of high complexity.I have made any additions or clarifications directly to the above note.This critical care time does not reflect procedure time, or teaching time or supervisory time of PA/NP/Med Resident etc but could involve care discussion time. I spent of neurocritical care time in the care of this patient. Delia Heady,, MD Stroke Neurology           To contact Stroke Continuity provider, please refer to [email or web address]. After hours, contact General Neurology

## 2015-11-15 NOTE — Progress Notes (Signed)
Pt NIH went from 10 to 18. Pt is less responsive this evening. Stat head ct scan ordered per Dr Hosie Poisson.

## 2015-11-15 NOTE — Progress Notes (Signed)
Patient ID: Jordan [name], male   DOB: 09-27-1946, 70 y.o.   MRN: [id number] IVC clamoed for 18 hours. Neuro stable. To dc drain

## 2015-11-15 NOTE — Care Management Important Message (Signed)
Important Message  Patient Details  Name: Jordan [name] MRN: [id number] Date of Birth: 07-Jul-1946   Medicare Important Message Given:  Yes    Kyla Balzarine 11/15/2015, 12:32 PM

## 2015-11-16 NOTE — Progress Notes (Signed)
STROKE TEAM PROGRESS NOTE  HISTORY ON ADMISSION  Jordan [name] is a 70 y.o. male with a history of htn, recent NSTEMI in December who underwent CABG at that time. He was found to have Afib with RVR and was started on coumadin. He was doing well until last night, when he began to feel ill and have n/v. This AM, he was found to be confused and EMS was called. He was found to have a new peri-ventricular hemorrhage with large amounts of IVH.   He was initially following commands when he first presented, but has subsequently worsened and is no longer doing so.   ICH score: 2(though with IVH, may be > 30cc which would be a 3)  LKW: 1/14  tpa given?: no, hemorrhage    SUBJECTIVE (INTERVAL HISTORY) No family at bedside He remains alert and interactive today.Neurosurgery  did  Dc ventric 11/14/14 Stable overnight   OBJECTIVE Temp:  [98.1 F (36.7 C)-100 F (37.8 C)] 98.2 F (36.8 C) (01/26 1200) Pulse Rate:  [96-121] 114 (01/26 1300) Cardiac Rhythm:  [-] Sinus tachycardia (01/26 0800) Resp:  [11-40] 30 (01/26 1300) BP: (96-159)/(70-117) 99/76 mmHg (01/26 1300) SpO2:  [90 %-100 %] 95 % (01/26 1300) Weight:  [196 lb 13.9 oz (89.3 kg)] 196 lb 13.9 oz (89.3 kg) (01/26 0451)  CBC:   Recent Labs Lab 11/11/15 1056  WBC 14.5*  NEUTROABS 11.7*  HGB 10.7*  HCT 34.4*  MCV 84.9  PLT 531*    Basic Metabolic Panel:   Recent Labs Lab 11/11/15 1056 11/14/15 1440  NA 141 138  K 4.0 3.8  CL 107 104  CO2 23 21*  GLUCOSE 112* 167*  BUN 22* 36*  CREATININE 1.67* 2.04*  CALCIUM 9.1 8.8*  MG  --  2.4    Lipid Panel:     Component Value Date/Time   TRIG 136 11/05/2015 1650   HgbA1c:  Lab Results  Component Value Date   HGBA1C 5.6 10/12/2015   Urine Drug Screen:     Component Value Date/Time   LABOPIA NONE DETECTED 11/05/2015 0751   COCAINSCRNUR NONE DETECTED 11/05/2015 0751   LABBENZ NONE DETECTED 11/05/2015 0751   AMPHETMU NONE DETECTED 11/05/2015 0751   THCU NONE DETECTED  11/05/2015 0751   LABBARB NONE DETECTED 11/05/2015 0751      IMAGING   Ct Head Wo Contrast 11/09/2015  Evolving LEFT cerebral intraparenchymal hematoma with intraventricular extension. Stable position of RIGHT frontal ventriculostomy catheter, similar mild hydrocephalus, decreasing LEFT ventricular entrapment. Similar 5 mm LEFT-to-RIGHT midline shift, and mild LEFT uncal Herniation.   11/07/2015    Evolving LEFT cerebral hematoma with intraventricular extension. Stable position of RIGHT frontal ventriculostomy catheter with resolving hydrocephalus, decreasing LEFT ventricular entrapment. 5 mm LEFT-to-RIGHT midline shift, improved.   11/06/2015    Evolving LEFT intraparenchymal hematoma with intraventricular extension. Interval placement of RIGHT frontal ventriculostomy catheter with resolving hydrocephalus, however there is persistent LEFT temporal horn entrapment. 7 mm LEFT-to-RIGHT midline shift is likely postprocedural. New small amount of RIGHT frontal extra-axial pneumocephalus.   11/05/2015   1. Increase in volume of hemorrhage within the LEFT lateral ventricle anomaly within the LEFT atria and to lesser degree the temporal horn. 2. Increase in parenchymal edema surrounding the LEFT lateral ventricle hemorrhage. 3. No midline shift.  Basilar cisterns are patent. 4. No change in volume of hemorrhage within the third ventricle and fourth ventricle and anterior ventricles.   11/05/2015    New acute parenchymal hemorrhage abutting the atria of  the left lateral ventricle measuring approximate 1.6 x 2.6 cm with mild adjacent edema. Hemorrhage extends into the ventricular system as described. Chronic ischemic microvascular disease. Small old left-sided lacunar infar     Dg Chest Port 1 View   11/11/2015 Endotracheal tube and NG tube has been removed. Improvement in aeration left base. Minimal residual streaky atelectasis or scarring. No new infiltrate or pulmonary edema.   11/07/2015   1. Lines and tubes in stable position. 2. Prior CABG. Stable cardiomegaly. No pulmonary venous congestion 3. Low volumes with persistent bibasilar atelectasis. Persistent left lower lobe infiltrate noted.  11/06/2015   1. Lines and tubes in stable position. 2. Heart size stable. No pulmonary venous congestion on today's exam. 3. Persistent but improving left lower lobe infiltrate/edema. Low lung volumes. Small left pleural effusion cannot be excluded.   11/05/2015   Hypoinflation with findings suggesting mild vascular congestion. Tubes and lines as described. Interval placement left IJ central venous catheter with tip over the SVC. No pneumothorax.   11/05/2015    Endotracheal tube and nasogastric tube in appropriate position. Improved aeration with decreased bibasilar atelectasis.   11/05/2015    Hypo inflation with mild opacification of the medial left base which may be due to effusions/atelectasis although cannot exclude infection. Suggestion of minimal vascular congestion.    PHYSICAL EXAM Elderly Caucasian male   . Afebrile. Head is s/p EVD.Marland Kitchen Neck is supple without bruit.    Cardiac exam no murmur or gallop. Lungs are clear to auscultation. Distal pulses are well felt. Neurological Exam :  Patient is alert today. He is speaking more however he is severely aphasic. He speaks few words but mostly unintelligible speech and , follows simple command. Left gaze preference. Blinks to threat on the left but not on the right. Pupils irregular but reactive. Fundi were not visualized. Vision acuity cannot be reliably tested. Mild right lower facial weakness. Tongue midline. Motor system exam antigravity and normal strength on the left side. Right hemiplegia with grade 1-2/5  strength on the right. Diminished on the right. Sensation appear diminished on the right. Right plantar upgoing left downgoing. Gait was not tested.   ASSESSMENT/PLAN Jordan [name] is a 70 y.o. male with history of  hypertension, recent NSTEMI status post CABG in December, AFIB w/ RVR on coumadin presenting with confusion. CT show a periventricular hemorrhage with IVH.  Stroke:  left parietal intraparenchymal hemorrhage with intraventricular extension and increase in hemorrhage size since admission. Hydrocephalus. Cerebral edema. Uncal herniation. Hemorrhage secondary to warfarin related coagulopathy  Resultant  R HP, expressive aphasia, follows one-step commands  IVC insertion R frontal ([place] 1/15). Normal  Drainage. stable  CT Still with stable edema, shift. Decreasing left ventricular entrapment  CL placed for possible 3%, ok to d/c  HgbA1c 5.6 in Dec  SCDs for VTE prophylaxis. Now on Lovenox. DIET DYS 3 Room service appropriate?: Yes; Fluid consistency:: Thin  aspirin 81 mg daily and warfarin daily prior to admission. No antithrombotic's at this time secondary to hemorrhage.  Ongoing aggressive stroke risk factor management  Continue ICU level care given IVC  Therapy recommendations:  CIR (previous notes indicate wife wants SNF, unsure her understanding, RN will address with wife)  Disposition:  pending    Acute respiratory failure, resolved  Intubated in the ED after neurologic worsening  Extubated 11/07/15  Persistent left lower lobe infiltrate on chest x-ray 11/07/2015 - now resolved   Atrial Fibrillation  Home anticoagulation:  Warfarin and aspirin 81 mg.  INR 2.81 on admission  Rapid reversal protocol due to IVH  INR now 1.16  Consider aspirin after 1 week and NOAC after 1 month if hemorrhage remains stable  Hypertension  Stable, low at times  Hyperlipidemia  Home meds:  Lipitor 10  Resume lipitor at discharge  Other Stroke Risk Factors  Advanced age  Former Cigarette smoker, quit smoking 32 years ago   Obesity, Body mass index is 29.06 kg/(m^2).   Coronary artery disease - NSTEMI s/p CABG 12/16  Obstructive sleep apnea  Other Active  Problems  Bipolar d/o  Stage III chronic kidney disease, baseline creatinine 1.8  Hypokalemia, repleted. CCM following and addressing  Anemia of critical illness  Lactic acidosis in setting of ICH  Leukocytosis - 14.5 on Sunday  UTI - day #1 IV Rocephin - culture pending - adjust antibiotics as appropriate.   PLAN Transfer to the neurology floor bed. Mobilize out of bed. Transfer to rehabilitation for the next few days.  Family not available at the bedside for discussion Hospital day # 11     Delia Heady,, MD Stroke Neurology           To contact Stroke Continuity provider, please refer to [email or web address]. After hours, contact General Neurology

## 2015-11-16 NOTE — Progress Notes (Signed)
SLP Cancellation Note  Patient Details Name: Jordan [name] MRN: [id number] DOB: [birth year]   Cancelled treatment:       Reason Eval/Treat Not Completed: Fatigue/lethargy limiting ability to participate. Patient arouses briefly with max cueing but unable to maintain adequate level of arousal for treatment today.   Ferdinand Lango MA, CCC-SLP [phone]    [name] Meryl 11/16/2015, 11:11 AM

## 2015-11-16 NOTE — Progress Notes (Signed)
Patient ID: Jordan [name], male   DOB: January 01, 1946, 70 y.o.   MRN: [id number] Neuro stable. Wound dry

## 2015-11-16 NOTE — [place] FL2 (Signed)
[place] MEDICAID FL2 LEVEL OF CARE SCREENING TOOL     IDENTIFICATION  Patient Name: Jordan [name] Birthdate: January 27, 1946 Sex: male Admission Date (Current Location): 11/05/2015  [facility] and [place] Number:  Producer, television/film/video and Address:  The . [facility], [address], [place], [place] [address]      Provider Number: [id number]  Attending Physician Name and Address:  Micki Riley, MD  Relative Name and Phone Number:       Current Level of Care: Hospital Recommended Level of Care: Skilled Nursing Facility Prior Approval Number:    Date Approved/Denied:   PASRR Number: [id number]  Discharge Plan: SNF    Current Diagnoses: Patient Active Problem List   Diagnosis Date Noted  . ICH (intracerebral hemorrhage) (HCC) 11/05/2015  . Cerebral hemorrhage (HCC)   . Respiratory failure (HCC)   . Postoperative anemia due to acute blood loss 10/30/2015  . CAD (coronary artery disease)   . S/P CABG (coronary artery bypass graft)   . S/P CABG x 4   . Tricuspid valve regurgitation 10/13/2015  . ACS (acute coronary syndrome) (HCC)   . Acute congestive heart failure (HCC)   . Atrial fibrillation with RVR (HCC) 10/10/2015  . Demand ischemia (HCC) 10/09/2015  . Non-STEMI (non-ST elevated myocardial infarction) (HCC) 10/08/2015  . Acute diastolic (congestive) heart failure (HCC) 10/08/2015  . Stage III chronic kidney disease 05/06/2015  . Hiatal hernia 05/06/2015  . Cervical spondylosis with radiculopathy 12/04/2012  . Hyperlipidemia 11/21/2007  . Obstructive sleep apnea 11/21/2007  . Hypertensive heart disease with CHF (congestive heart failure) (HCC)     Orientation RESPIRATION BLADDER Height & Weight    Self  Normal Incontinent  (175.3 cm) 196 lbs.  BEHAVIORAL SYMPTOMS/MOOD NEUROLOGICAL BOWEL NUTRITION STATUS      Incontinent Diet (Dysphagia 3)  AMBULATORY STATUS COMMUNICATION OF NEEDS Skin   Extensive Assist Verbally (Aphasic) Surgical  wounds (Right Head Incision)                       Personal Care Assistance Level of Assistance  Bathing, Feeding, Dressing Bathing Assistance: Maximum assistance Feeding assistance: Limited assistance Dressing Assistance: Maximum assistance     Functional Limitations Info  Sight, Hearing, Speech Sight Info: Adequate Hearing Info: Adequate Speech Info: Impaired    SPECIAL CARE FACTORS FREQUENCY  PT (By licensed PT), OT (By licensed OT), Speech therapy     PT Frequency: 3 OT Frequency: 3     Speech Therapy Frequency: 3      Contractures Contractures Info: Not present    Additional Factors Info  Code Status, Allergies Code Status Info: Full Allergies Info: No Known Allergies           Current Medications (11/16/2015):  This is the current hospital active medication list Current Facility-Administered Medications  Medication Dose Route Frequency Provider Last Rate Last Dose  . 0.9 %  sodium chloride infusion   Intravenous Continuous Coralyn Helling, MD   Stopped at 11/12/15 2000  . acetaminophen (TYLENOL) tablet 650 mg  650 mg Oral Q4H PRN Rejeana Brock, MD   650 mg at 11/08/15 1157   Or  . acetaminophen (TYLENOL) suppository 650 mg  650 mg Rectal Q4H PRN Rejeana Brock, MD   650 mg at 11/05/15 1613  . amiodarone (PACERONE) tablet 200 mg  200 mg Per Tube Daily Coralyn Helling, MD   200 mg at 11/16/15 0959  . antiseptic oral rinse (CPC /  CETYLPYRIDINIUM CHLORIDE 0.05%) solution 7 mL  7 mL Mouth Rinse BID Micki Riley, MD   7 mL at 11/16/15 1003  . bisacodyl (DULCOLAX) suppository 10 mg  10 mg Rectal Daily PRN Coralyn Helling, MD      . cefTRIAXone (ROCEPHIN) 1 g in dextrose 5 % 50 mL IVPB  1 g Intravenous Q24H David L Rinehuls, PA-C   1 g at 11/16/15 1003  . enoxaparin (LOVENOX) injection 40 mg  40 mg Subcutaneous Q24H Layne Benton, NP   40 mg at 11/16/15 0958  . furosemide (LASIX) tablet 40 mg  40 mg Oral Daily Layne Benton, NP   40 mg at 11/16/15 0959  .  labetalol (NORMODYNE,TRANDATE) injection 10-40 mg  10-40 mg Intravenous Q10 min PRN Rejeana Brock, MD   20 mg at 11/16/15 0032  . pantoprazole (PROTONIX) EC tablet 40 mg  40 mg Oral Daily David L Rinehuls, PA-C   40 mg at 11/16/15 0959  . senna-docusate (Senokot-S) tablet 1 tablet  1 tablet Oral BID Rejeana Brock, MD   1 tablet at 11/16/15 0959  . sennosides (SENOKOT) 8.8 MG/5ML syrup 5 mL  5 mL Per Tube BID PRN Coralyn Helling, MD         Discharge Medications: Please see discharge summary for a list of discharge medications.  Relevant Imaging Results:  Relevant Lab Results:   Additional Information SSN [id number]  Macario Golds, [place] [phone]     I have personally examined this patient, reviewed notes, independently viewed imaging studies, participated in medical decision making and plan of care. . Agree with note above.   Delia Heady, MD Medical Director [facility] Pager: [phone] 11/16/2015 5:48 PM

## 2015-11-16 NOTE — Clinical Social Work Note (Signed)
Clinical Social Worker continuing to follow patient and family for support and discharge planning needs.  Patient no longer with IVC drain, however does remain in restraints.  Patient wife requesting [facility], per inpatient rehab admissions coordinator note - CSW previously stated that [facility] is out of network for patient current insurance provider.  CSW has initiated new referral to all [facility] facilities and will follow up with patient ex wife to determine bed choice for discharge.  CSW remains available for support and to facilitate patient discharge needs once medically stable.  Macario Golds, [place] [phone]

## 2015-11-16 NOTE — Progress Notes (Signed)
Rehab admissions - I received a call from insurance carrier and they will not authorize inpatient rehab and SNF.  They will pay for one or the other venue.  I spoke with patient's ex-wife.  She cannot provide care and patient has no caregiver support after a potential inpatient rehab admission.  Because patient will need prolonged rehab, he will need SNF placement.  Ex-wife prefers [facility] as her first choice.  She does NOT want [facility] having had very poor care at that facility.  Social worker to follow up for SNF placement once patient is medically ready for discharge.  Call me for questions.  #[phone]

## 2015-11-17 MED ORDER — CIPROFLOXACIN HCL 500 MG PO TABS: 500.0000 mg | ORAL_TABLET | Freq: Two times a day (BID) | ORAL | Status: DC

## 2015-11-17 MED ORDER — SENNOSIDES-DOCUSATE SODIUM 8.6-50 MG PO TABS: 1.0000 | ORAL_TABLET | Freq: Two times a day (BID) | ORAL | Status: DC

## 2015-11-17 MED ORDER — ATORVASTATIN CALCIUM 10 MG PO TABS: 10.0000 mg | ORAL_TABLET | Freq: Every day | ORAL | Status: DC

## 2015-11-17 NOTE — Progress Notes (Signed)
SLP Cancellation Note  Patient Details Name: Jordan [name] MRN: [id number] DOB: 24-Oct-1945   Cancelled treatment:       Reason Eval/Treat Not Completed: Fatigue/lethargy limiting ability to participate.    [name] 11/17/2015, 3:49 PM

## 2015-11-17 NOTE — Progress Notes (Signed)
Patient discharged to [facility] nursing facility and transported via [place] [facility] ambulance. Patient stable with no neuro changes at time of discharge, vital signs stable and WDL. Paperwork, discharge instructions, and belongings sent with patient. Report called to [facility] and facility notified that patient is on the way. Family updated.

## 2015-11-17 NOTE — Clinical Social Work Placement (Signed)
   CLINICAL SOCIAL WORK PLACEMENT  NOTE  Date:  11/17/2015  Patient Details  Name: Jordan [name] MRN: [id number] Date of Birth: 30-Oct-1945  Clinical Social Work is seeking post-discharge placement for this patient at the Skilled  Nursing Facility level of care (*CSW will initial, date and re-position this form in  chart as items are completed):  Yes   Patient/family provided with [place] Clinical Social Work Department's list of facilities offering this level of care within the geographic area requested by the patient (or if unable, by the patient's family).  Yes   Patient/family informed of their freedom to choose among providers that offer the needed level of care, that participate in Medicare, Medicaid or managed care program needed by the patient, have an available bed and are willing to accept the patient.  Yes   Patient/family informed of [facility]'s ownership interest in [facility] and [facility], as well as of the fact that they are under no obligation to receive care at these facilities.  PASRR submitted to EDS on       PASRR number received on       Existing PASRR number confirmed on 11/15/15     FL2 transmitted to all facilities in geographic area requested by pt/family on 11/17/15     FL2 transmitted to all facilities within larger geographic area on       Patient informed that his/her managed care company has contracts with or will negotiate with certain facilities, including the following:        Yes   Patient/family informed of bed offers received.  Patient chooses bed at [facility]     Physician recommends and patient chooses bed at      Patient to be transferred to [facility] on 11/17/15.  Patient to be transferred to facility by Ambulance     Patient family notified on 11/17/15 of transfer.  Name of family member notified:  Patient wife via phone     PHYSICIAN Please prepare priority discharge summary, including medications      Additional Comment:   Macario Golds, LCSW [phone]

## 2015-11-17 NOTE — Progress Notes (Signed)
Patient discharged to [facility] nursing facility and transported via [facility] ambulance. Patient stable with no neuro changes at time of discharge, vital signs stable and WDL. Paperwork, discharge instructions, and belongings sent with patient. Report called to [facility] and facility notified that patient is on the way. Family updated.         

## 2015-11-17 NOTE — Clinical Social Work Note (Signed)
Clinical Social Worker facilitated patient discharge including contacting patient family and facility to confirm patient discharge plans.  Clinical information faxed to facility and family agreeable with plan.  CSW arranged ambulance transport via [facility] to [facility].  RN to call report prior to discharge.  Clinical Social Worker will sign off for now as social work intervention is no longer needed. Please consult us again if new need arises.  Jesse Avleen Bordwell, LCSW [phone] 

## 2015-11-17 NOTE — Discharge Summary (Addendum)
Physician Discharge Summary  Patient ID: Jordan [name] MRN: [id number] DOB/AGE: 02/08/1946 70 y.o.  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 Etiology of the hemorrhage warfarin related coagulopathy for atrial fibrillation Principal Problem:   ICH (intracerebral hemorrhage) (HCC) Active Problems:   Respiratory failure [facility])   Discharged Condition: fair  Hospital Course: Jordan [name] is a 70 y.o. male with a history of htn, recent NSTEMI in December who underwent CABG at that time. He was found to have Afib with RVR and was started on coumadin. He was doing well until last night, when he began to feel ill and have n/v. This AM, he was found to be confused and EMS was called. He was found to have a new peri-ventricular hemorrhage with large amounts of IVH.  He was initially following commands when he first presented, but has subsequently worsened and is no longer doing so.  ICH score: 2(though with IVH, may be > 30cc which would be a 3)  LKW: 1/14  tpa given?: no, hemorrhage Patient was admitted to the intensive care unit and INR on admission was 2.81 this was reversed with Kcentra. His blood pressure was tightly controlled with Cardene drip. He was initially intubated and neurosurgery was consulted and did ventriculostomy for relief of ICP by Dr. Jeral Fruit. Ventricle ostomy drain well. Patient condition gradually improved and he was extubated. He was seen by critical care medicine in the ICU he was started on vancomycin and Zosyn for presumed aspiration pneumonia. Follow-up CT scan showed stable appearance of the hemorrhage and eventual clearing of intraventricular blood. His ventricular stomach catheter was  removed electively by neurosurgery after it had been clamped for a day and patient tolerated catheter removal well without neurological change. He remained aphasic and speaking only few words and occasional sentences and was able to swallow and was placed on a diet. He had mild residual right-sided weakness. Patient's ex-wife was as close as next of kin who agreed with transferring patient for further rehabilitation needs to skilled nursing facility. Patient was felt to be neurologically stable for discharge on the day of transfer. Patient was felt to be at high risk for recurrent strokes but given his intracerebral hemorrhage on warfarin this was discharge and he was placed only on aspirin 81 mg and the time of discharge with plan to consider switching to eliquis 6 weeks later. Consults: pulmonary/intensive care, Neurosurgery  Significant Diagnostic Studies:   CBC:   Last Labs      Recent Labs Lab 11/11/15 1056  WBC 14.5*  NEUTROABS 11.7*  HGB 10.7*  HCT 34.4*  MCV 84.9  PLT 531*      Basic Metabolic Panel:   Last Labs      Recent Labs Lab 11/11/15 1056 11/14/15 1440  NA 141 138  K 4.0 3.8  CL 107 104  CO2 23 21*  GLUCOSE 112* 167*  BUN 22* 36*  CREATININE 1.67* 2.04*  CALCIUM 9.1 8.8*  MG --  2.4      Lipid Panel:   Labs (Brief)       Component Value Date/Time   TRIG 136 11/05/2015 1650     HgbA1c:   Recent Labs    Lab Results  Component Value Date   HGBA1C 5.6 10/12/2015     Urine Drug Screen:  Labs (Brief)       Component Value Date/Time   LABOPIA NONE DETECTED 11/05/2015 0751   COCAINSCRNUR NONE DETECTED 11/05/2015 0751   LABBENZ NONE DETECTED 11/05/2015 0751   AMPHETMU NONE DETECTED 11/05/2015 0751   THCU NONE DETECTED 11/05/2015 0751   LABBARB NONE DETECTED 11/05/2015 0751        IMAGING   Ct Head Wo Contrast 11/09/2015  Evolving LEFT cerebral  intraparenchymal hematoma with intraventricular extension. Stable position of RIGHT frontal ventriculostomy catheter, similar mild hydrocephalus, decreasing LEFT ventricular entrapment. Similar 5 mm LEFT-to-RIGHT midline shift, and mild LEFT uncal Herniation.   11/07/2015  Evolving LEFT cerebral hematoma with intraventricular extension. Stable position of RIGHT frontal ventriculostomy catheter with resolving hydrocephalus, decreasing LEFT ventricular entrapment. 5 mm LEFT-to-RIGHT midline shift, improved.   11/06/2015  Evolving LEFT intraparenchymal hematoma with intraventricular extension. Interval placement of RIGHT frontal ventriculostomy catheter with resolving hydrocephalus, however there is persistent LEFT temporal horn entrapment. 7 mm LEFT-to-RIGHT midline shift is likely postprocedural. New small amount of RIGHT frontal extra-axial pneumocephalus.   11/05/2015  1. Increase in volume of hemorrhage within the LEFT lateral ventricle anomaly within the LEFT atria and to lesser degree the temporal horn. 2. Increase in parenchymal edema surrounding the LEFT lateral ventricle hemorrhage. 3. No midline shift. Basilar cisterns are patent. 4. No change in volume of hemorrhage within the third ventricle and fourth ventricle and anterior ventricles.   11/05/2015  New acute parenchymal hemorrhage abutting the atria of the left lateral ventricle measuring approximate 1.6 x 2.6 cm with mild adjacent edema. Hemorrhage extends into the ventricular system as described. Chronic ischemic microvascular disease. Small old left-sided lacunar infar     Dg Chest Port 1 View   11/11/2015 Endotracheal tube and NG tube has been removed. Improvement in aeration left base. Minimal residual streaky atelectasis or scarring. No new infiltrate or pulmonary edema.   11/07/2015  1. Lines and tubes in stable position. 2. Prior CABG. Stable cardiomegaly. No pulmonary venous congestion 3. Low volumes with  persistent bibasilar atelectasis. Persistent left lower lobe infiltrate noted.  11/06/2015  1. Lines and tubes in stable position. 2. Heart size stable. No pulmonary venous congestion on today's exam. 3. Persistent but improving left lower lobe infiltrate/edema. Low lung volumes. Small left pleural effusion cannot be excluded.   11/05/2015  Hypoinflation with findings suggesting mild vascular congestion. Tubes and lines as described. Interval placement left IJ central venous catheter with tip over the SVC. No pneumothorax.   11/05/2015  Endotracheal tube and nasogastric tube in appropriate position. Improved aeration with decreased bibasilar atelectasis.   11/05/2015  Hypo inflation with mild opacification of the medial left base which may be due to effusions/atelectasis although cannot exclude infection. Suggestion of minimal vascular congestion.           Discharge Exam: Blood pressure 114/95, pulse 122, temperature 98.8 F (37.1 C), temperature source Axillary, resp. rate 26, height  (1.753 m), weight 192 lb 10.9 oz (87.4 kg), SpO2 100 %.  PHYSICAL EXAM Elderly Caucasian male . Afebrile. Scalp has clipis at EVD site. Neck is supple without bruit. Cardiac exam no murmur or gallop. Lungs are clear to auscultation. Distal pulses are well felt. Neurological Exam :  Patient is alert today. He is speaking more however he is severely aphasic. He speaks few words but mostly unintelligible speech and , follows simple command. Left gaze preference. Blinks to threat on the left but not on the right. Pupils irregular but reactive. Fundi were  not visualized. Vision acuity cannot be reliably tested. Mild right lower facial weakness. Tongue midline. Motor system exam antigravity and normal strength on the left side. Right hemiplegia with grade 1-2/5 strength on the right. Diminished on the right. Sensation appear diminished on the right. Right plantar upgoing left downgoing. Gait was  not tested.   Disposition: 03-Skilled Nursing Facility  Discharge Instructions    Ambulatory referral to Neurology    Complete by:  As directed   Please schedule post stroke follow up in 2 months.     May consider starting Eliquis after 6 weeks for anticoagulation ofr AFIB and stopping aspirin       Medication List    STOP taking these medications        aspirin 81 MG EC tablet     folic acid 1 MG tablet  Commonly known as:  FOLVITE     guaiFENesin 600 MG 12 hr tablet  Commonly known as:  MUCINEX     lansoprazole 15 MG capsule  Commonly known as:  PREVACID     oxyCODONE 5 MG immediate release tablet  Commonly known as:  Oxy IR/ROXICODONE      TAKE these medications        amiodarone 200 MG tablet  Commonly known as:  PACERONE  Take 1 tablet (200 mg total) by mouth daily.     atorvastatin 10 MG tablet  Commonly known as:  LIPITOR  Take 1 tablet (10 mg total) by mouth daily at 6 PM.     ciprofloxacin 500 MG tablet  Commonly known as:  CIPRO  Take 1 tablet (500 mg total) by mouth 2 (two) times daily.  Start taking on:  11/18/2015     ferrous sulfate 325 (65 FE) MG tablet  Take 1 tablet (325 mg total) by mouth daily with breakfast. For one month then stop.     furosemide 40 MG tablet  Commonly known as:  LASIX  Take 1 tablet (40 mg total) by mouth daily.     senna-docusate 8.6-50 MG tablet  Commonly known as:  Senokot-S  Take 1 tablet by mouth 2 (two) times daily.           Follow-up Information    Follow up with Che[place]ha Behlke, MD In 2 months.   Specialties:  Neurology, Radiology   Why:  Stroke Clinic, Office will call you with appointment date & time   Contact information:   [address] [place] [address] [phone]       Follow up with Emeterio Reeve, MD In 2 weeks.   Specialty:  Family Medicine   Why:  or MD in nursing home, for hospital follow up   Contact information:   [address] [place]  [address] [phone]      Total time spent on doing discharge summary 35 minutes. Signed: Elan Brainerd 11/17/2015, 1:16 PM

## 2015-11-17 NOTE — Progress Notes (Signed)
Physical Therapy Treatment Patient Details Name: DEMITRIS POKORNY MRN: [id number] DOB: 04-11-1946 Today's Date: 11/17/2015    History of Present Illness pt presents with L PArietal Intraparenchymal Hemmorhage with Intraventricular extension, Intubated 1/15-1/17, Cerebral Edema, and Hydrocephalus.  pt with hx of recent NSTEMI, CABG, MVR, and TVR.  pt PMH of HTN, Bipolar, Anxiety, CKD, and CAD.      PT Comments    Pt lethargic and flat throughout session and with decreased participation today.  Pt continues to require 2 person A for all mobility.  Pt at this time would benefit from SNF level of care to allow for more longer term therapies.  Will continue to follow.    Follow Up Recommendations  SNF     Equipment Recommendations  None recommended by PT    Recommendations for Other Services       Precautions / Restrictions Precautions Precautions: Fall Restrictions Weight Bearing Restrictions: No    Mobility  Bed Mobility Overal bed mobility: Needs Assistance;+2 for physical assistance Bed Mobility: Supine to Sit;Sit to Supine     Supine to sit: Max assist;+2 for physical assistance Sit to supine: Total assist;+2 for physical assistance   General bed mobility comments: Even with initiation pt had limited participation today and needed increased A for mobility.    Transfers Overall transfer level: Needs assistance Equipment used: 2 person hand held assist Transfers: Sit to/from Stand Sit to Stand: Total assist;+2 physical assistance         General transfer comment: pt with minimal participation in coming to standing.  Attempted x2.    Ambulation/Gait                 Stairs            Wheelchair Mobility    Modified Rankin (Stroke Patients Only)       Balance Overall balance assessment: Needs assistance Sitting-balance support: Single extremity supported;Feet supported Sitting balance-Leahy Scale: Poor Sitting balance - Comments: pt fatigues  quickly.  Initially needing MinA to maintain balance, but progressed quickly to ModA.     Standing balance support: During functional activity Standing balance-Leahy Scale: Poor                      Cognition Arousal/Alertness: Lethargic Behavior During Therapy: Flat affect Overall Cognitive Status: Difficult to assess Area of Impairment: Following commands;Attention   Current Attention Level: Focused   Following Commands: Follows one step commands inconsistently       General Comments: pt lethargic and remains flat throughout session.  pt does open eyes some, but at times needs cues to open eyes.      Exercises      General Comments        Pertinent Vitals/Pain Pain Assessment: Faces Faces Pain Scale: No hurt    Home Living                      Prior Function            PT Goals (current goals can now be found in the care plan section) Acute Rehab PT Goals Patient Stated Goal: pt unable to state. PT Goal Formulation: Patient unable to participate in goal setting Time For Goal Achievement: 11/21/15 Potential to Achieve Goals: Good Progress towards PT goals: Progressing toward goals    Frequency  Min 3X/week    PT Plan Discharge plan needs to be updated    Co-evaluation  End of Session Equipment Utilized During Treatment: Gait belt Activity Tolerance: Patient limited by lethargy Patient left: in bed;with call bell/phone within reach;with bed alarm set;with family/visitor present     Time: 2130-8657 PT Time Calculation (min) (ACUTE ONLY): 26 min  Charges:  $Therapeutic Activity: 23-37 mins                    G CodesSunny Schlein, [place] [phone] 11/17/2015, 11:53 AM

## 2015-11-19 ENCOUNTER — Inpatient Hospital Stay (HOSPITAL_COMMUNITY): Payer: PPO

## 2015-11-19 ENCOUNTER — Encounter (HOSPITAL_COMMUNITY): Payer: Self-pay | Admitting: Emergency Medicine

## 2015-11-19 ENCOUNTER — Emergency Department (HOSPITAL_COMMUNITY): Payer: PPO

## 2015-11-19 ENCOUNTER — Inpatient Hospital Stay (HOSPITAL_COMMUNITY)
Admission: EM | Admit: 2015-11-19 | Discharge: 2015-12-02 | DRG: 023 | Disposition: A | Discharge status: Hospice | Payer: PPO | Attending: Internal Medicine | Admitting: Internal Medicine

## 2015-11-19 DIAGNOSIS — R4182 Altered mental status, unspecified: Secondary | ICD-10-CM | POA: Diagnosis present

## 2015-11-19 DIAGNOSIS — N179 Acute kidney failure, unspecified: Secondary | ICD-10-CM | POA: Diagnosis present

## 2015-11-19 DIAGNOSIS — G9340 Encephalopathy, unspecified: Secondary | ICD-10-CM | POA: Diagnosis not present

## 2015-11-19 DIAGNOSIS — Z952 Presence of prosthetic heart valve: Secondary | ICD-10-CM | POA: Diagnosis not present

## 2015-11-19 DIAGNOSIS — I610 Nontraumatic intracerebral hemorrhage in hemisphere, subcortical: Secondary | ICD-10-CM | POA: Diagnosis not present

## 2015-11-19 DIAGNOSIS — R4701 Aphasia: Secondary | ICD-10-CM | POA: Diagnosis present

## 2015-11-19 DIAGNOSIS — I252 Old myocardial infarction: Secondary | ICD-10-CM | POA: Diagnosis not present

## 2015-11-19 DIAGNOSIS — Z66 Do not resuscitate: Secondary | ICD-10-CM | POA: Diagnosis present

## 2015-11-19 DIAGNOSIS — F419 Anxiety disorder, unspecified: Secondary | ICD-10-CM | POA: Diagnosis present

## 2015-11-19 DIAGNOSIS — E876 Hypokalemia: Secondary | ICD-10-CM | POA: Diagnosis present

## 2015-11-19 DIAGNOSIS — Z87891 Personal history of nicotine dependence: Secondary | ICD-10-CM | POA: Diagnosis not present

## 2015-11-19 DIAGNOSIS — G911 Obstructive hydrocephalus: Secondary | ICD-10-CM | POA: Diagnosis present

## 2015-11-19 DIAGNOSIS — I4891 Unspecified atrial fibrillation: Secondary | ICD-10-CM | POA: Insufficient documentation

## 2015-11-19 DIAGNOSIS — I619 Nontraumatic intracerebral hemorrhage, unspecified: Secondary | ICD-10-CM | POA: Diagnosis present

## 2015-11-19 DIAGNOSIS — R1314 Dysphagia, pharyngoesophageal phase: Secondary | ICD-10-CM | POA: Diagnosis not present

## 2015-11-19 DIAGNOSIS — I10 Essential (primary) hypertension: Secondary | ICD-10-CM | POA: Diagnosis not present

## 2015-11-19 DIAGNOSIS — G039 Meningitis, unspecified: Secondary | ICD-10-CM | POA: Diagnosis not present

## 2015-11-19 DIAGNOSIS — F319 Bipolar disorder, unspecified: Secondary | ICD-10-CM | POA: Diagnosis present

## 2015-11-19 DIAGNOSIS — D72829 Elevated white blood cell count, unspecified: Secondary | ICD-10-CM | POA: Diagnosis not present

## 2015-11-19 DIAGNOSIS — J9601 Acute respiratory failure with hypoxia: Secondary | ICD-10-CM | POA: Diagnosis not present

## 2015-11-19 DIAGNOSIS — Z951 Presence of aortocoronary bypass graft: Secondary | ICD-10-CM | POA: Diagnosis not present

## 2015-11-19 DIAGNOSIS — Z515 Encounter for palliative care: Secondary | ICD-10-CM | POA: Diagnosis not present

## 2015-11-19 DIAGNOSIS — R410 Disorientation, unspecified: Secondary | ICD-10-CM

## 2015-11-19 DIAGNOSIS — Z4659 Encounter for fitting and adjustment of other gastrointestinal appliance and device: Secondary | ICD-10-CM

## 2015-11-19 DIAGNOSIS — E870 Hyperosmolality and hypernatremia: Secondary | ICD-10-CM | POA: Diagnosis present

## 2015-11-19 DIAGNOSIS — R739 Hyperglycemia, unspecified: Secondary | ICD-10-CM | POA: Diagnosis present

## 2015-11-19 DIAGNOSIS — Z7901 Long term (current) use of anticoagulants: Secondary | ICD-10-CM

## 2015-11-19 DIAGNOSIS — I629 Nontraumatic intracranial hemorrhage, unspecified: Secondary | ICD-10-CM | POA: Diagnosis present

## 2015-11-19 DIAGNOSIS — E871 Hypo-osmolality and hyponatremia: Secondary | ICD-10-CM | POA: Diagnosis present

## 2015-11-19 DIAGNOSIS — Z981 Arthrodesis status: Secondary | ICD-10-CM

## 2015-11-19 DIAGNOSIS — R402444 Other coma, without documented Glasgow coma scale score, or with partial score reported, 24 hours or more after hospital admission: Secondary | ICD-10-CM | POA: Diagnosis not present

## 2015-11-19 DIAGNOSIS — I480 Paroxysmal atrial fibrillation: Secondary | ICD-10-CM | POA: Diagnosis present

## 2015-11-19 DIAGNOSIS — R400 Somnolence: Secondary | ICD-10-CM | POA: Diagnosis not present

## 2015-11-19 DIAGNOSIS — E162 Hypoglycemia, unspecified: Secondary | ICD-10-CM | POA: Diagnosis not present

## 2015-11-19 DIAGNOSIS — N183 Chronic kidney disease, stage 3 (moderate): Secondary | ICD-10-CM | POA: Diagnosis present

## 2015-11-19 DIAGNOSIS — G8191 Hemiplegia, unspecified affecting right dominant side: Secondary | ICD-10-CM | POA: Diagnosis present

## 2015-11-19 DIAGNOSIS — I2510 Atherosclerotic heart disease of native coronary artery without angina pectoris: Secondary | ICD-10-CM | POA: Diagnosis present

## 2015-11-19 DIAGNOSIS — G009 Bacterial meningitis, unspecified: Principal | ICD-10-CM | POA: Diagnosis present

## 2015-11-19 DIAGNOSIS — Z452 Encounter for adjustment and management of vascular access device: Secondary | ICD-10-CM | POA: Diagnosis present

## 2015-11-19 DIAGNOSIS — G92 Toxic encephalopathy: Secondary | ICD-10-CM | POA: Diagnosis present

## 2015-11-19 DIAGNOSIS — J9600 Acute respiratory failure, unspecified whether with hypoxia or hypercapnia: Secondary | ICD-10-CM | POA: Diagnosis present

## 2015-11-19 DIAGNOSIS — R509 Fever, unspecified: Secondary | ICD-10-CM | POA: Diagnosis not present

## 2015-11-19 DIAGNOSIS — I129 Hypertensive chronic kidney disease with stage 1 through stage 4 chronic kidney disease, or unspecified chronic kidney disease: Secondary | ICD-10-CM | POA: Diagnosis present

## 2015-11-19 DIAGNOSIS — R1310 Dysphagia, unspecified: Secondary | ICD-10-CM | POA: Diagnosis not present

## 2015-11-19 DIAGNOSIS — G4733 Obstructive sleep apnea (adult) (pediatric): Secondary | ICD-10-CM | POA: Diagnosis present

## 2015-11-19 DIAGNOSIS — R4020 Unspecified coma: Secondary | ICD-10-CM | POA: Diagnosis not present

## 2015-11-19 LAB — URINALYSIS, ROUTINE W REFLEX MICROSCOPIC
Bilirubin Urine: NEGATIVE
Glucose, UA: NEGATIVE mg/dL
Ketones, ur: NEGATIVE mg/dL
LEUKOCYTES UA: NEGATIVE
Nitrite: NEGATIVE
PROTEIN: 30 mg/dL — AB
SPECIFIC GRAVITY, URINE: 1.02 (ref 1.005–1.030)
pH: 5 (ref 5.0–8.0)

## 2015-11-19 LAB — PROCALCITONIN: PROCALCITONIN: 0.27 ng/mL

## 2015-11-19 LAB — CBC WITH DIFFERENTIAL/PLATELET
BASOS PCT: 0 %
Basophils Absolute: 0 10*3/uL (ref 0.0–0.1)
EOS ABS: 0 10*3/uL (ref 0.0–0.7)
EOS PCT: 0 %
HCT: 36.6 % — ABNORMAL LOW (ref 39.0–52.0)
HEMOGLOBIN: 11.6 g/dL — AB (ref 13.0–17.0)
Lymphocytes Relative: 8 %
Lymphs Abs: 1.9 10*3/uL (ref 0.7–4.0)
MCH: 26.5 pg (ref 26.0–34.0)
MCHC: 31.7 g/dL (ref 30.0–36.0)
MCV: 83.8 fL (ref 78.0–100.0)
Monocytes Absolute: 1 10*3/uL (ref 0.1–1.0)
Monocytes Relative: 4 %
Neutro Abs: 21 10*3/uL — ABNORMAL HIGH (ref 1.7–7.7)
Neutrophils Relative %: 88 %
PLATELETS: 563 10*3/uL — AB (ref 150–400)
RBC: 4.37 MIL/uL (ref 4.22–5.81)
RDW: 15.6 % — ABNORMAL HIGH (ref 11.5–15.5)
WBC: 23.9 10*3/uL — ABNORMAL HIGH (ref 4.0–10.5)

## 2015-11-19 LAB — URINE MICROSCOPIC-ADD ON
BACTERIA UA: NONE SEEN
RBC / HPF: NONE SEEN RBC/hpf (ref 0–5)
SQUAMOUS EPITHELIAL / LPF: NONE SEEN
WBC UA: NONE SEEN WBC/hpf (ref 0–5)

## 2015-11-19 LAB — I-STAT ARTERIAL BLOOD GAS, ED
Acid-base deficit: 4 mmol/L — ABNORMAL HIGH (ref 0.0–2.0)
Bicarbonate: 19.2 mEq/L — ABNORMAL LOW (ref 20.0–24.0)
O2 SAT: 92 %
PCO2 ART: 31 mmHg — AB (ref 35.0–45.0)
PH ART: 7.406 (ref 7.350–7.450)
PO2 ART: 66 mmHg — AB (ref 80.0–100.0)
Patient temperature: 101.1
TCO2: 20 mmol/L (ref 0–100)

## 2015-11-19 LAB — CBG MONITORING, ED: GLUCOSE-CAPILLARY: 111 mg/dL — AB (ref 65–99)

## 2015-11-19 LAB — COMPREHENSIVE METABOLIC PANEL
ALBUMIN: 3.1 g/dL — AB (ref 3.5–5.0)
ALK PHOS: 210 U/L — AB (ref 38–126)
ALT: 161 U/L — AB (ref 17–63)
AST: 128 U/L — ABNORMAL HIGH (ref 15–41)
Anion gap: 15 (ref 5–15)
BILIRUBIN TOTAL: 0.5 mg/dL (ref 0.3–1.2)
BUN: 65 mg/dL — ABNORMAL HIGH (ref 6–20)
CALCIUM: 9.2 mg/dL (ref 8.9–10.3)
CO2: 18 mmol/L — AB (ref 22–32)
CREATININE: 2.85 mg/dL — AB (ref 0.61–1.24)
Chloride: 109 mmol/L (ref 101–111)
GFR calc non Af Amer: 21 mL/min — ABNORMAL LOW (ref >60)
GFR, EST AFRICAN AMERICAN: 24 mL/min — AB (ref >60)
GLUCOSE: 142 mg/dL — AB (ref 65–99)
Potassium: 4.3 mmol/L (ref 3.5–5.1)
SODIUM: 142 mmol/L (ref 135–145)
TOTAL PROTEIN: 7.5 g/dL (ref 6.5–8.1)

## 2015-11-19 LAB — BRAIN NATRIURETIC PEPTIDE: B Natriuretic Peptide: 348.7 pg/mL — ABNORMAL HIGH (ref 0.0–100.0)

## 2015-11-19 LAB — GLUCOSE, CAPILLARY: GLUCOSE-CAPILLARY: 118 mg/dL — AB (ref 65–99)

## 2015-11-19 LAB — I-STAT CG4 LACTIC ACID, ED: Lactic Acid, Venous: 1.47 mmol/L (ref 0.5–2.0)

## 2015-11-19 MED ORDER — INSULIN ASPART 100 UNIT/ML ~~LOC~~ SOLN
0.0000 [IU] | SUBCUTANEOUS | Status: DC
  Administered 2015-11-23 – 2015-11-26 (×6): 2 [IU] via SUBCUTANEOUS
  Administered 2015-11-27: 3 [IU] via SUBCUTANEOUS
  Administered 2015-11-28: 2 [IU] via SUBCUTANEOUS

## 2015-11-19 MED ORDER — PANTOPRAZOLE SODIUM 40 MG IV SOLR
40.0000 mg | Freq: Every day | INTRAVENOUS | Status: DC
  Administered 2015-11-19 – 2015-11-27 (×9): 40 mg via INTRAVENOUS
  Filled 2015-11-19 (×9): qty 40

## 2015-11-19 MED ORDER — SODIUM CHLORIDE 0.9 % IV BOLUS (SEPSIS)
500.0000 mL | Freq: Once | INTRAVENOUS | Status: AC
  Administered 2015-11-19: 500 mL via INTRAVENOUS

## 2015-11-19 MED ORDER — LABETALOL HCL 5 MG/ML IV SOLN: 10.0000 mg | INTRAVENOUS | Status: DC | PRN

## 2015-11-19 MED ORDER — SODIUM CHLORIDE 0.9 % IV SOLN
INTRAVENOUS | Status: DC
  Administered 2015-11-19: 14:00:00 via INTRAVENOUS

## 2015-11-19 MED ORDER — SODIUM CHLORIDE 0.9 % IV SOLN
INTRAVENOUS | Status: DC
  Administered 2015-11-19: 11:00:00 via INTRAVENOUS

## 2015-11-19 MED ORDER — VANCOMYCIN HCL 10 G IV SOLR
2000.0000 mg | Freq: Once | INTRAVENOUS | Status: AC
  Administered 2015-11-19: 2000 mg via INTRAVENOUS
  Filled 2015-11-19: qty 2000

## 2015-11-19 MED ORDER — CETYLPYRIDINIUM CHLORIDE 0.05 % MT LIQD
7.0000 mL | Freq: Two times a day (BID) | OROMUCOSAL | Status: DC
  Administered 2015-11-19 – 2015-11-20 (×3): 7 mL via OROMUCOSAL

## 2015-11-19 MED ORDER — DEXTROSE 5 % IV SOLN
2.0000 g | INTRAVENOUS | Status: DC
  Administered 2015-11-19: 2 g via INTRAVENOUS
  Filled 2015-11-19 (×2): qty 2

## 2015-11-19 MED ORDER — METOPROLOL TARTRATE 1 MG/ML IV SOLN
2.5000 mg | INTRAVENOUS | Status: DC | PRN
  Administered 2015-11-20 – 2015-11-22 (×3): 5 mg via INTRAVENOUS
  Filled 2015-11-19 (×4): qty 5

## 2015-11-19 MED ORDER — VANCOMYCIN HCL IN DEXTROSE 1-5 GM/200ML-% IV SOLN
1000.0000 mg | INTRAVENOUS | Status: DC
  Administered 2015-11-20 – 2015-11-28 (×9): 1000 mg via INTRAVENOUS
  Filled 2015-11-19 (×9): qty 200

## 2015-11-19 NOTE — ED Notes (Signed)
From [facility] SN[facility]: Called EMS for less responsive than usual and abnormal respirations. RR in cheyne stokes pattern about 50-60/min. Only at SNF for one full day, was recently admitted for ICH in January with pneumonia during hospitalization. Arrives not talking, eyes open. Family reports usually says a few words since the ICH, family reports breathing normal yesterday.

## 2015-11-19 NOTE — ED Notes (Signed)
Patient transported to CT 

## 2015-11-19 NOTE — Progress Notes (Signed)
Bedside EEG completed in ED.  Results pending.  MD aware.

## 2015-11-19 NOTE — Progress Notes (Signed)
ANTIBIOTIC CONSULT NOTE - INITIAL  Pharmacy Consult for Vancomycin, Cefepime  Indication: HCAP   No Known Allergies  Patient Measurements: Height:  (175.3 cm) Weight: 190 lb (86.183 kg) IBW/kg (Calculated) : 70.7  Vital Signs: Temp: 101.1 F (38.4 C) (01/29 1020) Temp Source: Rectal (01/29 1020) BP: 133/101 mmHg (01/29 1005) Pulse Rate: 122 (01/29 1005) Intake/Output from previous day:   Intake/Output from this shift:    Labs: No results for input(s): WBC, HGB, PLT, LABCREA, CREATININE in the last 72 hours. Estimated Creatinine Clearance: 36.1 mL/min (by C-G formula based on Cr of 2.04). No results for input(s): VANCOTROUGH, VANCOPEAK, VANCORANDOM, GENTTROUGH, GENTPEAK, GENTRANDOM, TOBRATROUGH, TOBRAPEAK, TOBRARND, AMIKACINPEAK, AMIKACINTROU, AMIKACIN in the last 72 hours.   Microbiology: Recent Results (from the past 720 hour(s))  Urine culture     Status: None   Collection Time: 11/05/15  7:51 AM  Result Value Ref Range Status   Specimen Description URINE, CATHETERIZED  Final   Special Requests NONE  Final   Culture NO GROWTH 1 DAY  Final   Report Status 11/06/2015 FINAL  Final  Blood culture (routine x 2)     Status: None   Collection Time: 11/05/15  8:40 AM  Result Value Ref Range Status   Specimen Description BLOOD RIGHT ANTECUBITAL  Final   Special Requests BOTTLES DRAWN AEROBIC AND ANAEROBIC 5CC  Final   Culture NO GROWTH 5 DAYS  Final   Report Status 11/10/2015 FINAL  Final  Blood culture (routine x 2)     Status: None   Collection Time: 11/05/15  8:48 AM  Result Value Ref Range Status   Specimen Description BLOOD LEFT ANTECUBITAL  Final   Special Requests BOTTLES DRAWN AEROBIC AND ANAEROBIC 5CC  Final   Culture NO GROWTH 5 DAYS  Final   Report Status 11/10/2015 FINAL  Final  MRSA PCR Screening     Status: None   Collection Time: 11/05/15 11:18 AM  Result Value Ref Range Status   MRSA by PCR NEGATIVE NEGATIVE Final    Comment:        The GeneXpert  MRSA Assay (FDA approved for NASAL specimens only), is one component of a comprehensive MRSA colonization surveillance program. It is not intended to diagnose MRSA infection nor to guide or monitor treatment for MRSA infections.   Culture, Urine     Status: None   Collection Time: 11/12/15 12:07 AM  Result Value Ref Range Status   Specimen Description URINE, RANDOM  Final   Special Requests none Normal  Final   Culture >=100,000 COLONIES/mL ESCHERICHIA COLI  Final   Report Status 11/14/2015 FINAL  Final   Organism ID, Bacteria ESCHERICHIA COLI  Final      Susceptibility   Escherichia coli - MIC*    AMPICILLIN >=32 RESISTANT Resistant     CEFAZOLIN <=4 SENSITIVE Sensitive     CEFTRIAXONE <=1 SENSITIVE Sensitive     CIPROFLOXACIN 1 SENSITIVE Sensitive     GENTAMICIN <=1 SENSITIVE Sensitive     IMIPENEM <=0.25 SENSITIVE Sensitive     NITROFURANTOIN <=16 SENSITIVE Sensitive     TRIMETH/SULFA >=320 RESISTANT Resistant     AMPICILLIN/SULBACTAM 16 INTERMEDIATE Intermediate     PIP/TAZO <=4 SENSITIVE Sensitive     * >=100,000 COLONIES/mL ESCHERICHIA COLI    Medical History: Past Medical History  Diagnosis Date  . Hypertension   . Kidney stone   . Bipolar 1 disorder (HCC)   . Diverticulitis   . Anxiety   . Arthritis   .  Sleep apnea   . Chronic headaches   . Stage III chronic kidney disease     Baseline creatinine 1.8  . Left inguinal hernia   . Atherosclerosis   . CAD (coronary artery disease)     a. 10/11/2015: total occlusion of OM1 and LCx, 50% stenosis in distal LM, 80% stenosis in LAD, 99% stenosis in RCA --> CABG recommended.  . Hiatal hernia   . 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 Antritis (stomach)     Medications:   (Not in a hospital admission) Assessment: 32 YOM with recent d/c after admission for large ICH. Now presents with fever and tachypnea. WBC is elevated at 23.9. LA 1.47.  Pharmacy consulted to start empiric antibiotics for HCAP. CrCl ~ 25  mL/min   Goal of Therapy:  Vancomycin trough level 15-20 mcg/ml  Plan:  -Vancomycin 2 gm IV load followed by Vancomycin 1 gm IV Q 24 hours -Cefepime 2 gm IV Q 24 hours -Monitor CBC, renal fx, cultures and clinical progress -[facility] at [facility]   Vinnie Level, PharmD., BCPS Clinical Pharmacist Phone [phone]

## 2015-11-19 NOTE — Consult Note (Signed)
PULMONARY / CRITICAL CARE MEDICINE   Name: CYNTHIA STAINBACK MRN: [id number] DOB: 12/20/1945    ADMISSION DATE:  11/19/2015  REFERRING MD:  Francene Castle [place] Pax)   CHIEF COMPLAINT:  Respiratory failure   HISTORY OF PRESENT ILLNESS:   71yo male with hx HTN, OSA, bipolar disorder, CKD III, AFib on coumadin, CAD with recent NSTEMI 12/16 with subsequent CABG at that time.  Just d/c 1/27 after admission for large ICH with intraventricular extension.  He had worsening neurologic status requiring intubation and ventriculostomy. Course was c/b aspiration PNA and he was d/c on Cipro.   He improved slowly and was d/c to SNF with residual aphasia and R hemiparesis.  On 1/29 he was brought to ER with fever 102, tachypnea and decline in mental status.  No reports of cough, hemoptysis, vomiting or diarrhea.  In ER had had persistent hypoxia and tachypnea and PCCM called to admit to ICU.   PAST MEDICAL HISTORY :  He  has a past medical history of Hypertension; Kidney stone; Bipolar 1 disorder (HCC); Diverticulitis; Anxiety; Arthritis; Sleep apnea; Chronic headaches; Stage III chronic kidney disease; Left inguinal hernia; Atherosclerosis; CAD (coronary artery disease); Hiatal hernia; Esophagitis versus Mallory-Weiss tear; and Antritis (stomach).  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  No Known Allergies  No current facility-administered medications on file prior to encounter.   Current Outpatient Prescriptions on File Prior to Encounter  Medication Sig  . amiodarone (PACERONE) 200 MG tablet Take 1 tablet  (200 mg total) by mouth daily.  Marland Kitchen atorvastatin (LIPITOR) 10 MG tablet Take 1 tablet (10 mg total) by mouth daily at 6 PM.  . ferrous sulfate 325 (65 FE) MG tablet Take 1 tablet (325 mg total) by mouth daily with breakfast. For one month then stop.  . furosemide (LASIX) 40 MG tablet Take 1 tablet (40 mg total) by mouth daily.  Marland Kitchen senna-docusate (SENOKOT-S) 8.6-50 MG tablet Take 1 tablet by mouth 2 (two) times daily.  . ciprofloxacin (CIPRO) 500 MG tablet Take 1 tablet (500 mg total) by mouth 2 (two) times daily.  . [DISCONTINUED] buPROPion (WELLBUTRIN SR) 150 MG 12 hr tablet Take 450 mg by mouth daily.    FAMILY HISTORY:  His indicated that his mother is alive. He indicated that his father is deceased. He indicated that all of his three sisters are alive. He indicated that his brother is deceased.   SOCIAL HISTORY: He  reports that he quit smoking about 32 years ago. He has never used smokeless tobacco. He reports that he does not drink alcohol or use illicit drugs.  REVIEW OF SYSTEMS:   Unable - as per HPI obtained from records.   SUBJECTIVE:    VITAL SIGNS: BP 140/93 mmHg  Pulse 115  Temp(Src) 101.1 F (38.4 C) (Rectal)  Resp 45  Ht  (1.753 m)  Wt 86.183 kg (190 lb)  BMI 28.05 kg/m2  SpO2 97%  HEMODYNAMICS:    VENTILATOR SETTINGS:    INTAKE / OUTPUT:    PHYSICAL EXAMINATION: General:  Chronically ill appearing male, mild distress seen in ER  Neuro:  Somnolent, aphasic, minimally interactive, opens eyes, seems to track, PERRL HEENT:  Mm dry, no JVD, scalp incisions healing from EVD, 1 staple  remains Cardiovascular:  s1s2 rrr Lungs:  resps even, tachypneic but not significantly labored, no accessory muscle use, diminished bilat bases, kussmaul pattern at times Abdomen:  Round, soft, +bs Musculoskeletal:  Warm and dry, no sig edema   LABS:  BMET  Recent Labs Lab 11/14/15 1440 11/19/15 1027  NA 138 142  K 3.8 4.3  CL 104 109  CO2 21* 18*  BUN 36* 65*   CREATININE 2.04* 2.85*  GLUCOSE 167* 142*    Electrolytes  Recent Labs Lab 11/14/15 1440 11/19/15 1027  CALCIUM 8.8* 9.2  MG 2.4  --     CBC  Recent Labs Lab 11/19/15 1027  WBC 23.9*  HGB 11.6*  HCT 36.6*  PLT 563*    Coag's No results for input(s): APTT, INR in the last 168 hours.  Sepsis Markers  Recent Labs Lab 11/19/15 1027 11/19/15 1040  LATICACIDVEN  --  1.47  PROCALCITON 0.27  --     ABG  Recent Labs Lab 11/19/15 1103  PHART 7.406  PCO2ART 31.0*  PO2ART 66.0*    Liver Enzymes  Recent Labs Lab 11/19/15 1027  AST 128*  ALT 161*  ALKPHOS 210*  BILITOT 0.5  ALBUMIN 3.1*    Cardiac Enzymes No results for input(s): TROPONINI, PROBNP in the last 168 hours.  Glucose  Recent Labs Lab 11/19/15 1353  GLUCAP 111*    Imaging Ct Head Wo Contrast  11/19/2015  CLINICAL DATA:  Intra cerebral hemorrhage, follow-up EXAM: CT HEAD WITHOUT CONTRAST TECHNIQUE: Contiguous axial images were obtained from the base of the skull through the vertex without intravenous contrast. COMPARISON:  11/15/2015 FINDINGS: Left intra cerebral hemorrhage in the left parietal region again noted measuring 4.4 x 2.8 cm compared to 4.9 x 2.4 cm previously, likely not significantly changed. Surrounding edema is stable. No evidence of new or progressive hemorrhage. Intraventricular hemorrhage in the left lateral ventricle is again noted. Evolutionary changes. Time mildly increased prominence of the ventricles. Chronic microvascular disease throughout the deep white matter. 3 mm of left-to-right midline shift. No acute calvarial abnormality. IMPRESSION: Left parietal intraparenchymal hemorrhage not significantly changed. Stable surrounding edema. Intraventricular extension again noted. The ventricles are more prominent on today's study suggesting developing hydrocephalus. Slight left-to-right midline shift, 3 mm. Electronically Signed   By: Charlett Nose M.D.   On: 11/19/2015 13:05    Dg Chest Port 1 View  11/19/2015  CLINICAL DATA:  70 year old nursing home patient with acute onset of shortness of breath and fever which began earlier today. Recent hospitalization for intracranial hemorrhage, presumably hemorrhagic stroke, for which he was discharged 2 days ago. EXAM: PORTABLE CHEST 1 VIEW COMPARISON:  11/11/2015 and earlier. FINDINGS: Suboptimal inspiration accounts for crowded bronchovascular markings diffusely and atelectasis in the bases, and accentuates the cardiac silhouette. Taking this into account, cardiac silhouette upper normal in size to slightly enlarged but stable. Prior CABG and aortic valve replacement. Lungs otherwise clear. No localized airspace consolidation. No pleural effusions. No pneumothorax. Normal pulmonary vascularity. IMPRESSION: Suboptimal inspiration accounts for mild bibasilar atelectasis. No acute cardiopulmonary disease otherwise. Electronically Signed   By: Hulan Saas M.D.   On: 11/19/2015 10:48     STUDIES:  CT head 1/29>>>  Left parietal intraparenchymal hemorrhage not significantly changed.  Stable surrounding edema. Intraventricular extension again noted.  The ventricles are more prominent on today's study suggesting developing hydrocephalus. Slight left-to-right midline shift, 3 mm.  CULTURES: BC x 2 1/29>>> Urine 1/29>>>  ANTIBIOTICS: Vanc 1/29>>> Cefepime 1/29>>>  SIGNIFICANT EVENTS:  LINES/TUBES:   DISCUSSION: 70yo male with multiple medical problems and recent ICH in setting coumadin.  Admitted with fevers, tachypnea, mild hypoxemia and decline in mental status.  Treating empirically with broad spectrum abx but no obvious infection, normal lactate, CXR improving.   ASSESSMENT / PLAN:  PULMONARY Hypoxemia  Tachypnea - kussmaul respirations  Recent LLL PNA P:   Supplemental O2  High risk for intubation if family wants aggressive care - see discussion below  Empiric abx as above   CARDIOVASCULAR HTN AFib   CAD - s/p CABG  P:  No anticoagulation PRN labetalol to keep SBP <170 Trend BNP  Hold home lasix for now with AKI  Hold home amio for now - may need gtt  RENAL AKI on CKD III - baseline Scr ~1.7 P:   F/u chem  KVO IVF after ER boluses   GASTROINTESTINAL Abnormal LFT's  P:   Trend LFT's  NPO   HEMATOLOGIC Leukocytosis  P:  F/u CBC  SCD's   INFECTIOUS Fever, leukocytosis - no obvious source infection at this time.  CXR improving, u/a unimpressive.  Lactate normal.  P:   Check pct Empiric abx as above  Stat CT head as below   ENDOCRINE Hyperglycemia   P:   SSI   NEUROLOGIC AMS -- concern for further neurologic event given fever, tachypnea, decline in mental status with no obvious source of infection. ? Bacteremia  ICH - recent.  In setting coumadin.  P:   Stat CT head  Hold sedating medications  Need to discuss goals of care with wife, son -- en route.    FAMILY  - Updates: sister and brother updated at bedside in ER, awaiting wife and son for further goals of care discussion.  Sister and brother both feel that pt would not want to go back on the ventilator and would not be happy with even his current status (aphasic, significant deficits post ICH) but will certainly defer to wife for decision making.    Await CT head and discussions with wife, then will make decisions regarding admission plan.    Update: discussed at length with multiple family members including pt's wife and two children.  They feel strongly that he would NOT want "heroic measures" including ventilatory support, CPR, Defib, vasopressors.  They are certainly hopeful for recovery but are very realistic and want to ensure his comfort and dignitiy as well.  Will continue with broad spectrum abx, await neuro consult, follow up culture data but no intubation if respiratory status worsens.    Ok for admission to SDU per [facility].    Dirk Dress, NP 11/19/2015  3:19 PM Pager: [phone] or  [phone]

## 2015-11-19 NOTE — ED Provider Notes (Signed)
CSN: [id number]     Arrival date & time 11/19/15  4098 History   First MD Initiated Contact with Patient 11/19/15 1008     Chief Complaint  Patient presents with  . Fever  . Altered Mental Status     (Consider location/radiation/quality/duration/timing/severity/associated sxs/prior Treatment) Patient is a 70 y.o. male presenting with fever and altered mental status.  Fever Altered Mental Status Associated symptoms: fever      The patient was recently discharged from the hospital at her an episode of intracranial bleeding.  Had been discharged to nursing facility.  Over the last 12-24 hours he developed chills and a fever of 102.  Was brought in on oxygen and according to family member over the last 12-24 hours has had increased difficulty breathing.  Review of previous x-ray revealed possible infiltrate in left lung.  Patient was discharged on ciprofloxacin.  According to family member patient has no underlying lung disease. Past Medical History  Diagnosis Date  . Hypertension   . Kidney stone   . Bipolar 1 disorder (HCC)   . Diverticulitis   . Anxiety   . Arthritis   . Sleep apnea   . Chronic headaches   . Stage III chronic kidney disease     Baseline creatinine 1.8  . Left inguinal hernia   . Atherosclerosis   . CAD (coronary artery disease)     a. 10/11/2015: total occlusion of OM1 and LCx, 50% stenosis in distal LM, 80% stenosis in LAD, 99% stenosis in RCA --> CABG recommended.  . Hiatal hernia   . 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 Antritis (stomach)    Past Surgical History  Procedure Laterality Date  . Appendectomy      70 years old  . Partial colectomy    . Spine surgery  2013  . Anterior cervical decomp/discectomy fusion N/A 12/04/2012    Procedure: ANTERIOR CERVICAL DECOMPRESSION/DISCECTOMY FUSION 2 LEVELS;  Surgeon: Temple Pacini, MD;  Location: [facility];  Service: Neurosurgery;  Laterality: N/A;  Cervical five-six, six-seven anterior cervical  decompression/discectomy fusion with allograft and plating  . Esophagogastroduodenoscopy N/A 05/06/2015    Procedure: ESOPHAGOGASTRODUODENOSCOPY (EGD);  Surgeon: Vida Rigger, MD;  Location: [facility];  Service: Endoscopy;  Laterality: N/A;  . Cardiac catheterization N/A 10/11/2015    Procedure: Right/Left Heart Cath and Coronary Angiography;  Surgeon: Lyn Records, MD;  Location: [facility] INVASIVE CV LAB;  Service: Cardiovascular;  Laterality: N/A;  . Coronary artery bypass graft N/A 10/13/2015    Procedure: CORONARY ARTERY BYPASS GRAFTING (CABG) x four using left internal mammary artery and left leg greater saphenous vein harvested endoscopically;  Surgeon: [name], MD;  Location: [facility] OR;  Service: Open Heart Surgery;  Laterality: N/A;  . Tee without cardioversion N/A 10/13/2015    Procedure: TRANSESOPHAGEAL ECHOCARDIOGRAM (TEE);  Surgeon: [name], MD;  Location: [facility];  Service: Open Heart Surgery;  Laterality: N/A;  . Mitral valve replacement N/A 10/13/2015    Procedure: MITRAL VALVE (MV) REPLACEMENT;  Surgeon: [name], MD;  Location: [facility] OR;  Service: Open Heart Surgery;  Laterality: N/A;  . Tricuspid valve replacement N/A 10/13/2015    Procedure: TRICUSPID VALVE REPAIR;  Surgeon: [name], MD;  Location: [facility] OR;  Service: Open Heart Surgery;  Laterality: N/A;   Family History  Problem Relation Age of Onset  . Anesthesia problems Neg Hx   . Heart attack Father   . Liver cancer Paternal Grandfather  unsure of other cancers   . Diabetes Sister    Social History  Substance Use Topics  . Smoking status: Former Smoker -- 1.50 packs/day for 20 years    Quit date: 10/22/1983  . Smokeless tobacco: Never Used  . Alcohol Use: No    Review of Systems  Unable to perform ROS: Acuity of condition  Constitutional: Positive for fever.      Allergies  Review of patient's allergies indicates no known allergies.  Home Medications   Prior to Admission medications    Medication Sig Start Date End Date Taking? Authorizing Provider  amiodarone (PACERONE) 200 MG tablet Take 1 tablet (200 mg total) by mouth daily. 10/25/15  Yes Donielle Margaretann Loveless, PA-C  atorvastatin (LIPITOR) 10 MG tablet Take 1 tablet (10 mg total) by mouth daily at 6 PM. 11/17/15  Yes Layne Benton, NP  ferrous sulfate 325 (65 FE) MG tablet Take 1 tablet (325 mg total) by mouth daily with breakfast. For one month then stop. 10/25/15  Yes Donielle Margaretann Loveless, PA-C  furosemide (LASIX) 40 MG tablet Take 1 tablet (40 mg total) by mouth daily. 10/25/15  Yes Donielle Margaretann Loveless, PA-C  senna-docusate (SENOKOT-S) 8.6-50 MG tablet Take 1 tablet by mouth 2 (two) times daily. 11/17/15  Yes Layne Benton, NP  ciprofloxacin (CIPRO) 500 MG tablet Take 1 tablet (500 mg total) by mouth 2 (two) times daily. 11/18/15   Layne Benton, NP   BP 107/80 mmHg  Pulse 100  Temp(Src) 97.4 F (36.3 C) (Oral)  Resp 30  Ht  (1.753 m)  Wt 190 lb 11.2 oz (86.5 kg)  BMI 28.15 kg/m2  SpO2 99% Physical Exam  Constitutional: He appears ill. He appears distressed.  HENT:  Head: Normocephalic and atraumatic.  Eyes: Right pupil is reactive. Left pupil is reactive.  Cardiovascular: Tachycardia present.   Pulmonary/Chest: Tachypnea noted. He is in respiratory distress. He has wheezes.  Abdominal: Soft. He exhibits no distension. There is no tenderness. There is no rebound.  Lymphadenopathy:    He has no cervical adenopathy.  Neurological:  According to the wife the patient is not as verbal as he was yesterday.  There is no definite new focal neurologic findings.  Nursing note and vitals reviewed.   ED Course  Procedures (including critical care time) CRITICAL CARE Performed by: Nelva Nay L Total critical care time: 30 minutes Critical care time was exclusive of separately billable procedures and treating other patients. Critical care was necessary to treat or prevent imminent or life-threatening  deterioration. Critical care was time spent personally by me on the following activities: development of treatment plan with patient and/or surrogate as well as nursing, discussions with consultants, evaluation of patient's response to treatment, examination of patient, obtaining history from patient or surrogate, ordering and performing treatments and interventions, ordering and review of laboratory studies, ordering and review of radiographic studies, pulse oximetry and re-evaluation of patient's condition.   Medications  0.9 %  sodium chloride infusion ( Intravenous Stopped 11/19/15 1400)  0.9 %  sodium chloride infusion ( Intravenous Stopped 11/20/15 1100)  pantoprazole (PROTONIX) injection 40 mg (40 mg Intravenous Given 11/21/15 2135)  insulin aspart (novoLOG) injection 0-15 Units (0 Units Subcutaneous Not Given 11/22/15 1200)  vancomycin (VANCOCIN) IVPB 1000 mg/200 mL premix (1,000 mg Intravenous Given 11/22/15 1158)  metoprolol (LOPRESSOR) injection 2.5-5 mg (5 mg Intravenous Given 11/22/15 0327)  amiodarone (NEXTERONE PREMIX) 360 MG/200ML (1.8 mg/mL) IV infusion (0 mg/hr Intravenous Stopped 11/21/15 2000)  amiodarone (NEXTERONE PREMIX) 360 MG/200ML (1.8 mg/mL) IV infusion (30 mg/hr Intravenous Rate/Dose Verify 11/22/15 0700)  dextrose 5 % solution ( Intravenous Rate/Dose Verify 11/22/15 0700)  ceFEPIme (MAXIPIME) 2 g in dextrose 5 % 50 mL IVPB (2 g Intravenous Given 11/22/15 0947)  chlorhexidine (PERIDEX) 0.12 % solution 15 mL (15 mLs Mouth Rinse Given 11/22/15 0947)  antiseptic oral rinse (CPC / CETYLPYRIDINIUM CHLORIDE 0.05%) solution 7 mL (7 mLs Mouth Rinse Given 11/21/15 1655)  feeding supplement (VITAL AF 1.2 CAL) liquid 1,000 mL (not administered)  vancomycin (VANCOCIN) 2,000 mg in sodium chloride 0.9 % 500 mL IVPB (0 mg Intravenous Stopped 11/19/15 1303)  sodium chloride 0.9 % bolus 500 mL (0 mLs Intravenous Stopped 11/19/15 1200)    Labs Review Labs Reviewed  COMPREHENSIVE METABOLIC PANEL -  Abnormal; Notable for the following:    CO2 18 (*)    Glucose, Bld 142 (*)    BUN 65 (*)    Creatinine, Ser 2.85 (*)    Albumin 3.1 (*)    AST 128 (*)    ALT 161 (*)    Alkaline Phosphatase 210 (*)    GFR calc non Af Amer 21 (*)    GFR calc Af Amer 24 (*)    All other components within normal limits  CBC WITH DIFFERENTIAL/PLATELET - Abnormal; Notable for the following:    WBC 23.9 (*)    Hemoglobin 11.6 (*)    HCT 36.6 (*)    RDW 15.6 (*)    Platelets 563 (*)    Neutro Abs 21.0 (*)    All other components within normal limits  URINALYSIS, ROUTINE W REFLEX MICROSCOPIC (NOT AT [facility]) - Abnormal; Notable for the following:    APPearance CLOUDY (*)    Hgb urine dipstick MODERATE (*)    Protein, ur 30 (*)    All other components within normal limits  URINE MICROSCOPIC-ADD ON - Abnormal; Notable for the following:    Casts HYALINE CASTS (*)    All other components within normal limits  BRAIN NATRIURETIC PEPTIDE - Abnormal; Notable for the following:    B Natriuretic Peptide 348.7 (*)    All other components within normal limits  CBC - Abnormal; Notable for the following:    WBC 19.9 (*)    Hemoglobin 11.2 (*)    HCT 35.8 (*)    RDW 15.8 (*)    Platelets 531 (*)    All other components within normal limits  BASIC METABOLIC PANEL - Abnormal; Notable for the following:    Sodium 149 (*)    Chloride 119 (*)    CO2 19 (*)    Glucose, Bld 124 (*)    BUN 54 (*)    Creatinine, Ser 2.23 (*)    GFR calc non Af Amer 28 (*)    GFR calc Af Amer 32 (*)    All other components within normal limits  MAGNESIUM - Abnormal; Notable for the following:    Magnesium 2.8 (*)    All other components within normal limits  PHOSPHORUS - Abnormal; Notable for the following:    Phosphorus 4.8 (*)    All other components within normal limits  HEPATIC FUNCTION PANEL - Abnormal; Notable for the following:    Albumin 3.0 (*)    AST 189 (*)    ALT 238 (*)    Alkaline Phosphatase 225 (*)    All  other components within normal limits  GLUCOSE, CAPILLARY - Abnormal; Notable for the following:  Glucose-Capillary 118 (*)    All other components within normal limits  GLUCOSE, CAPILLARY - Abnormal; Notable for the following:    Glucose-Capillary 107 (*)    All other components within normal limits  GLUCOSE, CAPILLARY - Abnormal; Notable for the following:    Glucose-Capillary 105 (*)    All other components within normal limits  GLUCOSE, CAPILLARY - Abnormal; Notable for the following:    Glucose-Capillary 105 (*)    All other components within normal limits  TROPONIN I - Abnormal; Notable for the following:    Troponin I 0.09 (*)    All other components within normal limits  TROPONIN I - Abnormal; Notable for the following:    Troponin I 0.07 (*)    All other components within normal limits  TROPONIN I - Abnormal; Notable for the following:    Troponin I 0.06 (*)    All other components within normal limits  BLOOD GAS, ARTERIAL - Abnormal; Notable for the following:    pH, Arterial 7.473 (*)    pCO2 arterial 24.8 (*)    pO2, Arterial 110 (*)    Bicarbonate 17.8 (*)    Acid-base deficit 5.1 (*)    All other components within normal limits  PROTEIN AND GLUCOSE, CSF - Abnormal; Notable for the following:    Glucose, CSF 31 (*)    Total  Protein, CSF 174 (*)    All other components within normal limits  CSF CELL COUNT WITH DIFFERENTIAL - Abnormal; Notable for the following:    Color, CSF RED (*)    Appearance, CSF BLOODY (*)    RBC Count, CSF 12500 (*)    WBC, CSF 960 (*)    Segmented Neutrophils-CSF 97 (*)    Lymphs, CSF 1 (*)    Monocyte-Macrophage-Spinal Fluid 2 (*)    All other components within normal limits  GLUCOSE, CAPILLARY - Abnormal; Notable for the following:    Glucose-Capillary 102 (*)    All other components within normal limits  CBC - Abnormal; Notable for the following:    WBC 15.3 (*)    Hemoglobin 11.7 (*)    HCT 37.3 (*)    RDW 15.8 (*)     Platelets 524 (*)    All other components within normal limits  BASIC METABOLIC PANEL - Abnormal; Notable for the following:    Sodium 149 (*)    Chloride 116 (*)    CO2 19 (*)    Glucose, Bld 114 (*)    BUN 43 (*)    Creatinine, Ser 1.89 (*)    GFR calc non Af Amer 34 (*)    GFR calc Af Amer 40 (*)    All other components within normal limits  MAGNESIUM - Abnormal; Notable for the following:    Magnesium 2.6 (*)    All other components within normal limits  GLUCOSE, CAPILLARY - Abnormal; Notable for the following:    Glucose-Capillary 111 (*)    All other components within normal limits  GLUCOSE, CAPILLARY - Abnormal; Notable for the following:    Glucose-Capillary 111 (*)    All other components within normal limits  GLUCOSE, CAPILLARY - Abnormal; Notable for the following:    Glucose-Capillary 109 (*)    All other components within normal limits  GLUCOSE, CAPILLARY - Abnormal; Notable for the following:    Glucose-Capillary 107 (*)    All other components within normal limits  GLUCOSE, CAPILLARY - Abnormal; Notable for the following:    Glucose-Capillary 114 (*)  All other components within normal limits  GLUCOSE, CAPILLARY - Abnormal; Notable for the following:    Glucose-Capillary 104 (*)    All other components within normal limits  CBC - Abnormal; Notable for the following:    WBC 11.3 (*)    RBC 3.96 (*)    Hemoglobin 10.4 (*)    HCT 33.9 (*)    RDW 15.8 (*)    Platelets 497 (*)    All other components within normal limits  BASIC METABOLIC PANEL - Abnormal; Notable for the following:    Sodium 148 (*)    Chloride 113 (*)    CO2 20 (*)    Glucose, Bld 115 (*)    BUN 37 (*)    Creatinine, Ser 1.80 (*)    Calcium 8.8 (*)    GFR calc non Af Amer 36 (*)    GFR calc Af Amer 42 (*)    All other components within normal limits  GLUCOSE, CAPILLARY - Abnormal; Notable for the following:    Glucose-Capillary 109 (*)    All other components within normal limits   I-STAT ARTERIAL BLOOD GAS, ED - Abnormal; Notable for the following:    pCO2 arterial 31.0 (*)    pO2, Arterial 66.0 (*)    Bicarbonate 19.2 (*)    Acid-base deficit 4.0 (*)    All other components within normal limits  CBG MONITORING, ED - Abnormal; Notable for the following:    Glucose-Capillary 111 (*)    All other components within normal limits  CULTURE, BLOOD (ROUTINE X 2)  CULTURE, BLOOD (ROUTINE X 2)  URINE CULTURE  FUNGUS CULTURE W SMEAR  CSF CULTURE  PROCALCITONIN  GLUCOSE, CAPILLARY  HERPES SIMPLEX VIRUS(HSV) DNA BY PCR  PHOSPHORUS  LACTIC ACID, PLASMA  MAGNESIUM  PHOSPHORUS  GLUCOSE, CAPILLARY  GLUCOSE, CAPILLARY  GLUCOSE, CAPILLARY  GLUCOSE, CAPILLARY  GLUCOSE, CAPILLARY  I-STAT CG4 LACTIC ACID, ED  I-STAT CG4 LACTIC ACID, ED  CYTOLOGY - NON PAP    Imaging Review Ct Head Wo Contrast  11/22/2015  ADDENDUM REPORT: 11/22/2015 12:12 ADDENDUM: Study discussed by telephone with Dr. Ritta Slot on 11/22/2015 at 1208 hours. He advises that the patient did briefly have an EVD placement between today study an the exam on 11/19/2015 - the EVD was replaced but the patient then pulled it out. Therefore I feel that is the most likely explanation for the interval increased intracranial gas. CONCLUSION: Interval increased intracranial gas related to interval EVD placement and removal. Otherwise stable CT appearance of the brain since 11/19/2015. Electronically Signed   By: Odessa Fleming M.D.   On: 11/22/2015 12:12  11/22/2015  CLINICAL DATA:  70 year old male with intracranial hemorrhage, bacterial meningitis. Initial encounter. EXAM: CT HEAD WITHOUT CONTRAST TECHNIQUE: Contiguous axial images were obtained from the base of the skull through the vertex without intravenous contrast. COMPARISON:  11/19/2015 and earlier. FINDINGS: Study is mild degraded by motion artifact . Right superior approach external ventricular drain has been removed since the study on 11/15/2015. However, a tear  has been increased pneumo ventricle since that time, now moderate in the right lateral ventricle. Overall ventricle size and configuration otherwise has not significantly changed. Intraventricular extension of the left periatrial and temporal lobe hemorrhage has regressed since 11/15/2015. There is also a small volume of pneumocephalus now along the anterior right frontal lobe. No subdural or extradural blood identified. No new intracranial hemorrhage identified. Confluent hypodensity in the posterior left hemisphere in temporal lobe in a pattern most  compatible with vasogenic edema has not significantly changed. Stable gray-white matter differentiation. No new cortically based infarct identified. No acute osseous abnormality identified. Stable trace ethmoid sinus mucosal thickening. Other Visualized paranasal sinuses and mastoids are clear. No acute orbit or scalp soft tissue finding identified. IMPRESSION: 1. Abnormal increasing pneumoventricle and now trace pneumocephalus since the 11/15/2015 exam on which the external ventricular drain had already been removed. The appearance is suspicious for progressive gas-forming infection. 2. Otherwise stable appearance of left hemisphere edema and intra-axial hemorrhage with intraventricular extension. No acute ventriculomegaly. Electronically Signed: By: Odessa Fleming M.D. On: 11/22/2015 12:02   Dg Chest Port 1 View  11/22/2015  CLINICAL DATA:  Acute respiratory failure, meningitis, central line placement. EXAM: PORTABLE CHEST 1 VIEW COMPARISON:  Portable chest x-ray of November 22, 2015 at 1:12 a.m. FINDINGS: The lungs remain mildly hypoinflated. There is no pneumothorax or pleural effusion. There is no alveolar infiltrate. There are mildly increased lung markings at the left lung base. The heart is top-normal in size. The pulmonary vascularity is normal. There are post median sternotomy changes with evidence of previous mitral and tricuspid valve replacement. The left  internal jugular venous catheter tip projects over the proximal SVC. IMPRESSION: Persistent hypo inflation with minimal subsegmental atelectasis at the left lung base. There is no evidence of pulmonary edema or alveolar pneumonia. Electronically Signed   By: David  [place] M.D.   On: 11/22/2015 07:41   Dg Chest Port 1 View  11/22/2015  CLINICAL DATA:  Central line placement EXAM: PORTABLE CHEST 1 VIEW COMPARISON:  Yesterday FINDINGS: New left IJ central line with tip at the SVC origin. No pneumothorax. Low lung volumes with interstitial crowding/atelectasis. No effusion or edema. Chronic cardiomegaly. The patient is status post CABG and mitral/tricuspid valve replacement. IMPRESSION: 1. No adverse finding after central line placement. 2. Low volumes with basilar atelectasis. Electronically Signed   By: Marnee Spring M.D.   On: 11/22/2015 01:46   Dg Chest Port 1 View  11/21/2015  CLINICAL DATA:  Acute respiratory failure. EXAM: PORTABLE CHEST 1 VIEW COMPARISON:  11/20/2015 FINDINGS: Postsurgical changes from prior median sternotomy, CABG and valvular replacement are stable. Cervical spine fusion is also seen. Cardiomediastinal silhouette is normal. Mediastinal contours appear intact. There is no evidence of focal airspace consolidation, pleural effusion or pneumothorax. Lung volumes are low. Osseous structures are without acute abnormality. Soft tissues are grossly normal. IMPRESSION: Low lung volumes with bibasilar atelectasis. Stable postsurgical changes. Electronically Signed   By: Ted Mcalpine M.D.   On: 11/21/2015 07:33   I have personally reviewed and evaluated these images and lab results as part of my medical decision-making.   EKG Interpretation   Date/Time:  Sunday November 19 2015 10:04:25 EST Ventricular Rate:  122 PR Interval:  117 QRS Duration: 69 QT Interval:  363 QTC Calculation: 517 R Axis:   11 Text Interpretation:  Sinus tachycardia Probable left atrial enlargement   Abnormal R-wave progression, early transition Borderline ST depression,  lateral leads Prolonged QT interval No significant change since last  tracing Confirmed by Makilah Dowda  MD, Jameison Haji ([id number]) on 11/19/2015 10:22:42 AM     Discuss case with critical care medicine who will wait evaluate patient in emergency department MDM   Final diagnoses:  Altered mental status  Acute respiratory failure (HCC)  Encounter for central line placement  Meningitis  Encounter for feeding tube placement        Nelva Nay, MD 11/22/15 [id number]

## 2015-11-19 NOTE — Procedures (Signed)
  Current facility-administered medications:  .  0.9 %  sodium chloride infusion, , Intravenous, Continuous, Nelva Nay, MD, Stopped at 11/19/15 1400 .  0.9 %  sodium chloride infusion, , Intravenous, Continuous, Bernadene Person, NP, Last Rate: 50 mL/hr at 11/19/15 1400 .  antiseptic oral rinse (CPC / CETYLPYRIDINIUM CHLORIDE 0.05%) solution 7 mL, 7 mL, Mouth Rinse, BID, Bernadene Person, NP .  ceFEPIme (MAXIPIME) 2 g in dextrose 5 % 50 mL IVPB, 2 g, Intravenous, Q24H, Sampson Si, RPH, Stopped at 11/19/15 1115 .  insulin aspart (novoLOG) injection 0-15 Units, 0-15 Units, Subcutaneous, 6 times per day, Bernadene Person, NP, 0 Units at 11/19/15 1357 .  labetalol (NORMODYNE,TRANDATE) injection 10 mg, 10 mg, Intravenous, Q2H PRN, Bernadene Person, NP .  pantoprazole (PROTONIX) injection 40 mg, 40 mg, Intravenous, QHS, Bernadene Person, NP .  Melene Muller ON 11/20/2015] vancomycin (VANCOCIN) IVPB 1000 mg/200 mL premix, 1,000 mg, Intravenous, Q24H, Sampson Si, RPH  Introduction:  This is a 19 channel routine scalp EEG performed at the bedside with bipolar and monopolar montages arranged in accordance to the international 10/20 system of electrode placement. One channel was dedicated to EKG recording.    Findings:  The background rhythm showed bilateral generalized slowing, in the range of 5-6 Hz. Intermittent triphasic waves are also noted . No definite evidence of abnormal epileptiform discharges or electrographic seizures were noted during this recording.   Impression:  This is an abnormal routine inpatient EEG suggestive of at least moderate encephalopathy. No evidence of electrographic seizures were noted . Clinical correlation is recommended .

## 2015-11-19 NOTE — Consult Note (Signed)
Requesting Physician: Dr. Jamison Neighbor    Reason for consultation: Altered mental status  HPI:                                                                                                                                         Jordan [name] is an 70 y.o. male patient who presented acute altered mental status.    Past Medical History: Past Medical History  Diagnosis Date  . Hypertension   . Kidney stone   . Bipolar 1 disorder (HCC)   . Diverticulitis   . Anxiety   . Arthritis   . Sleep apnea   . Chronic headaches   . Stage III chronic kidney disease     Baseline creatinine 1.8  . Left inguinal hernia   . Atherosclerosis   . CAD (coronary artery disease)     a. 10/11/2015: total occlusion of OM1 and LCx, 50% stenosis in distal LM, 80% stenosis in LAD, 99% stenosis in RCA --> CABG recommended.  . Hiatal hernia   . 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 Antritis (stomach)     Past Surgical History  Procedure Laterality Date  . Appendectomy      70 years old  . Partial colectomy    . Spine surgery  2013  . Anterior cervical decomp/discectomy fusion N/A 12/04/2012    Procedure: ANTERIOR CERVICAL DECOMPRESSION/DISCECTOMY FUSION 2 LEVELS;  Surgeon: Temple Pacini, MD;  Location: [facility]UR[facility] ORS;  Service: Neurosurgery;  Laterality: N/A;  Cervical five-six, six-seven anterior cervical decompression/discectomy fusion with allograft and plating  . Esophagogastroduodenoscopy N/A 05/06/2015    Procedure: ESOPHAGOGASTRODUODENOSCOPY (EGD);  Surgeon: Vida Rigger, MD;  Location: [facility];  Service: Endoscopy;  Laterality: N/A;  . Cardiac catheterization N/A 10/11/2015    Procedure: Right/Left Heart Cath and Coronary Angiography;  Surgeon: Lyn Records, MD;  Location: [facility] INVASIVE CV LAB;  Service: Cardiovascular;  Laterality: N/A;  . Coronary artery bypass graft N/A 10/13/2015    Procedure: CORONARY ARTERY BYPASS GRAFTING (CABG) x four using left internal mammary artery and left leg greater  saphenous vein harvested endoscopically;  Surgeon: [name], MD;  Location: [facility] OR;  Service: Open Heart Surgery;  Laterality: N/A;  . Tee without cardioversion N/A 10/13/2015    Procedure: TRANSESOPHAGEAL ECHOCARDIOGRAM (TEE);  Surgeon: [name], MD;  Location: [facility] OR;  Service: Open Heart Surgery;  Laterality: N/A;  . Mitral valve replacement N/A 10/13/2015    Procedure: MITRAL VALVE (MV) REPLACEMENT;  Surgeon: [name], MD;  Location: [facility] OR;  Service: Open Heart Surgery;  Laterality: N/A;  . Tricuspid valve replacement N/A 10/13/2015    Procedure: TRICUSPID VALVE REPAIR;  Surgeon: [name], MD;  Location: [facility] OR;  Service: Open Heart Surgery;  Laterality: N/A;    Family History: Family History  Problem Relation Age of Onset  . Anesthesia problems Neg Hx   .  Heart attack Father   . Liver cancer Paternal Grandfather     unsure of other cancers   . Diabetes Sister     Social History:   reports that he quit smoking about 32 years ago. He has never used smokeless tobacco. He reports that he does not drink alcohol or use illicit drugs.  Allergies:  No Known Allergies   Medications:                                                                                                                         Current facility-administered medications:  .  0.9 %  sodium chloride infusion, , Intravenous, Continuous, Nelva Nay, MD, Stopped at 11/19/15 1400 .  0.9 %  sodium chloride infusion, , Intravenous, Continuous, Bernadene Person, NP, Last Rate: 50 mL/hr at 11/19/15 1400 .  antiseptic oral rinse (CPC / CETYLPYRIDINIUM CHLORIDE 0.05%) solution 7 mL, 7 mL, Mouth Rinse, BID, Bernadene Person, NP .  ceFEPIme (MAXIPIME) 2 g in dextrose 5 % 50 mL IVPB, 2 g, Intravenous, Q24H, Sampson Si, RPH, Stopped at 11/19/15 1115 .  insulin aspart (novoLOG) injection 0-15 Units, 0-15 Units, Subcutaneous, 6 times per day, Bernadene Person, NP, 0 Units at 11/19/15  1357 .  labetalol (NORMODYNE,TRANDATE) injection 10 mg, 10 mg, Intravenous, Q2H PRN, Bernadene Person, NP .  pantoprazole (PROTONIX) injection 40 mg, 40 mg, Intravenous, QHS, Bernadene Person, NP .  Melene Muller ON 11/20/2015] vancomycin (VANCOCIN) IVPB 1000 mg/200 mL premix, 1,000 mg, Intravenous, Q24H, Candis Schatz Mancheril, RPH   ROS:                                                                                                                                       History    unobtainable from patient due to mental status    Neurologic Examination:  Blood pressure 146/82, pulse 116, temperature 100.3 F (37.9 C), temperature source Oral, resp. rate 42, height  (1.753 m), weight 86.8 kg (191 lb 5.8 oz), SpO2 99 %.  Patient is unresponsive, agonal labored breathing, midline gaze, does not follow commands ,pupils equal reactive. Minimal withdrawal to stimulus in the left upper and lower extremities no motor response in the right.    Lab Results: Basic Metabolic Panel:  Recent Labs Lab 11/14/15 1440 11/19/15 1027  NA 138 142  K 3.8 4.3  CL 104 109  CO2 21* 18*  GLUCOSE 167* 142*  BUN 36* 65*  CREATININE 2.04* 2.85*  CALCIUM 8.8* 9.2  MG 2.4  --     Liver Function Tests:  Recent Labs Lab 11/19/15 1027  AST 128*  ALT 161*  ALKPHOS 210*  BILITOT 0.5  PROT 7.5  ALBUMIN 3.1*   No results for input(s): LIPASE, AMYLASE in the last 168 hours. No results for input(s): AMMONIA in the last 168 hours.  CBC:  Recent Labs Lab 11/19/15 1027  WBC 23.9*  NEUTROABS 21.0*  HGB 11.6*  HCT 36.6*  MCV 83.8  PLT 563*    Cardiac Enzymes: No results for input(s): CKTOTAL, CKMB, CKMBINDEX, TROPONINI in the last 168 hours.  Lipid Panel: No results for input(s): CHOL, TRIG, HDL, CHOLHDL, VLDL, LDLCALC in the last 168 hours.  CBG:  Recent Labs Lab 11/19/15 1353  GLUCAP  111*    Microbiology: Results for orders placed or performed during the hospital encounter of 11/05/15  Urine culture     Status: None   Collection Time: 11/05/15  7:51 AM  Result Value Ref Range Status   Specimen Description URINE, CATHETERIZED  Final   Special Requests NONE  Final   Culture NO GROWTH 1 DAY  Final   Report Status 11/06/2015 FINAL  Final  Blood culture (routine x 2)     Status: None   Collection Time: 11/05/15  8:40 AM  Result Value Ref Range Status   Specimen Description BLOOD RIGHT ANTECUBITAL  Final   Special Requests BOTTLES DRAWN AEROBIC AND ANAEROBIC 5CC  Final   Culture NO GROWTH 5 DAYS  Final   Report Status 11/10/2015 FINAL  Final  Blood culture (routine x 2)     Status: None   Collection Time: 11/05/15  8:48 AM  Result Value Ref Range Status   Specimen Description BLOOD LEFT ANTECUBITAL  Final   Special Requests BOTTLES DRAWN AEROBIC AND ANAEROBIC 5CC  Final   Culture NO GROWTH 5 DAYS  Final   Report Status 11/10/2015 FINAL  Final  MRSA PCR Screening     Status: None   Collection Time: 11/05/15 11:18 AM  Result Value Ref Range Status   MRSA by PCR NEGATIVE NEGATIVE Final    Comment:        The GeneXpert MRSA Assay (FDA approved for NASAL specimens only), is one component of a comprehensive MRSA colonization surveillance program. It is not intended to diagnose MRSA infection nor to guide or monitor treatment for MRSA infections.   Culture, Urine     Status: None   Collection Time: 11/12/15 12:07 AM  Result Value Ref Range Status   Specimen Description URINE, RANDOM  Final   Special Requests none Normal  Final   Culture >=100,000 COLONIES/mL ESCHERICHIA COLI  Final   Report Status 11/14/2015 FINAL  Final   Organism ID, Bacteria ESCHERICHIA COLI  Final      Susceptibility   Escherichia coli - MIC*  AMPICILLIN >=32 RESISTANT Resistant     CEFAZOLIN <=4 SENSITIVE Sensitive     CEFTRIAXONE <=1 SENSITIVE Sensitive     CIPROFLOXACIN 1  SENSITIVE Sensitive     GENTAMICIN <=1 SENSITIVE Sensitive     IMIPENEM <=0.25 SENSITIVE Sensitive     NITROFURANTOIN <=16 SENSITIVE Sensitive     TRIMETH/SULFA >=320 RESISTANT Resistant     AMPICILLIN/SULBACTAM 16 INTERMEDIATE Intermediate     PIP/TAZO <=4 SENSITIVE Sensitive     * >=100,000 COLONIES/mL ESCHERICHIA COLI     Imaging: Ct Head Wo Contrast  11/19/2015  CLINICAL DATA:  Intra cerebral hemorrhage, follow-up EXAM: CT HEAD WITHOUT CONTRAST TECHNIQUE: Contiguous axial images were obtained from the base of the skull through the vertex without intravenous contrast. COMPARISON:  11/15/2015 FINDINGS: Left intra cerebral hemorrhage in the left parietal region again noted measuring 4.4 x 2.8 cm compared to 4.9 x 2.4 cm previously, likely not significantly changed. Surrounding edema is stable. No evidence of new or progressive hemorrhage. Intraventricular hemorrhage in the left lateral ventricle is again noted. Evolutionary changes. Time mildly increased prominence of the ventricles. Chronic microvascular disease throughout the deep white matter. 3 mm of left-to-right midline shift. No acute calvarial abnormality. IMPRESSION: Left parietal intraparenchymal hemorrhage not significantly changed. Stable surrounding edema. Intraventricular extension again noted. The ventricles are more prominent on today's study suggesting developing hydrocephalus. Slight left-to-right midline shift, 3 mm. Electronically Signed   By: Charlett Nose M.D.   On: 11/19/2015 13:05   Ct Head Wo Contrast  11/15/2015  CLINICAL DATA:  Increased confusion, decreased responsiveness this evening after having IVC removed. EXAM: CT HEAD WITHOUT CONTRAST TECHNIQUE: Contiguous axial images were obtained from the base of the skull through the vertex without intravenous contrast. COMPARISON:  11/10/2015 FINDINGS: Acute left temporoparietal intraparenchymal hematoma measuring 2.4 x 4.9 cm. Surrounding edema in the deep white matter.  Intraventricular hemorrhage demonstrated in the left lateral ventricle and third ventricle. Hematomas are becoming less conspicuous than on previous study, likely due to normal evolution. No evidence of progression of hemorrhage. Postsurgical changes in the right anterior frontal region corresponding to the previous intraventricular catheter, removed since prior study. Small amount of subdural and intraventricular gas likely resulting from previous catheterization and less prominent than on previous study. Mild residual ventricular dilatation, somewhat less prominent than on prior study. There is residual sulcal effacement on the left with about 4 mm residual left-to-right midline shift. Underlying diffuse cerebral atrophy. Low-attenuation changes in the white matter consistent with small vessel ischemia. Basal cisterns are not effaced. No abnormal extra-axial fluid collections. IMPRESSION: Left temporoparietal intraventricular hematoma with surrounding white matter edema. Intraventricular hematoma. Intraventricular and subdural gas. Hematomas are becoming less distinct since previous study and ventricular dilatation and intraventricular gas is decreasing. Interval removal of intraventricular catheter. Mild residual left-to-right midline shift of about 4 mm. Electronically Signed   By: Burman Nieves M.D.   On: 11/15/2015 21:51   Dg Chest Port 1 View  11/19/2015  CLINICAL DATA:  70 year old nursing home patient with acute onset of shortness of breath and fever which began earlier today. Recent hospitalization for intracranial hemorrhage, presumably hemorrhagic stroke, for which he was discharged 2 days ago. EXAM: PORTABLE CHEST 1 VIEW COMPARISON:  11/11/2015 and earlier. FINDINGS: Suboptimal inspiration accounts for crowded bronchovascular markings diffusely and atelectasis in the bases, and accentuates the cardiac silhouette. Taking this into account, cardiac silhouette upper normal in size to slightly  enlarged but stable. Prior CABG and aortic valve replacement. Lungs otherwise clear.  No localized airspace consolidation. No pleural effusions. No pneumothorax. Normal pulmonary vascularity. IMPRESSION: Suboptimal inspiration accounts for mild bibasilar atelectasis. No acute cardiopulmonary disease otherwise. Electronically Signed   By: Hulan Saas M.D.   On: 11/19/2015 10:48    Assessment and plan:   Jordan [name] is an 70 y.o. male patient who presented with acute worsening mental status noted this morning by the nursing home staff. Also significant risk respiratory distress. He is DNR/DNI. CT of the head showed no significant change of the known left intracerebral hemorrhage .  A stat EEG done in the ER did not show evidence of electrographic seizures. It showed evidence of moderate encephalopathy, likely toxic metabolic etiology. No definite indication to start any seizure medications at this time, which may increase risk of sedation and worsening respiratory distress.  He'll be admitted to the ICU for further monitoring and care.    Discussed the neurological assessment and EEG results in detail with the patient's wife at bedside.

## 2015-11-19 NOTE — Progress Notes (Signed)
Pharmacy Code Sepsis Protocol  Time of code sepsis page: 1042  Antibiotics delivered at ~1035   Were antibiotics ordered at the time of the code sepsis page? Yes Was it required to contact the physician?  Physician not contacted  Physician contacted to order antibiotics for code sepsis  Physician contacted to recommend changing antibiotics  Pharmacy consulted for: Vancomycin, Cefepime   Anti-infectives    Start     Dose/Rate Route Frequency Ordered Stop   11/19/15 1100  vancomycin (VANCOCIN) 2,000 mg in sodium chloride 0.9 % 500 mL IVPB     2,000 mg 250 mL/hr over 120 Minutes Intravenous  Once 11/19/15 1027     11/19/15 1100  ceFEPIme (MAXIPIME) 2 g in dextrose 5 % 50 mL IVPB     2 g 100 mL/hr over 30 Minutes Intravenous Every 24 hours 11/19/15 1027          Nurse education provided: Abx administered as soon as code pager went off.   Minutes left to administer antibiotics to achieve 1 hour goal:   Correct order of antibiotic administration  Antibiotic Y-site compatibilities     Vinnie Level, PharmD., BCPS Clinical Pharmacist Phone [phone]

## 2015-11-19 NOTE — ED Notes (Signed)
EEG technician in room setting up for testing; she already has everything in place for testing. Will transfer to room after EEG.

## 2015-11-19 NOTE — Progress Notes (Signed)
eLink Physician-Brief Progress Note Patient Name: Jordan [name] DOB: [birth year] MRN: [id number]   Date of Service  11/19/2015  HPI/Events of Note  Rapid afib ? Missed amiodarone doses  eICU Interventions  Lopressor bolus prn IV     Intervention Category Major Interventions: Arrhythmia - evaluation and management  Sandrea Hughs 11/19/2015, 9:23 PM

## 2015-11-19 NOTE — ED Notes (Signed)
This RN called patient placement at 1530 to determine when patient would be able get a bed assignment because it had been 4 hours and 30 minutes since he was been put up for admission. Patient placement reported his bed assignment had changed 3 times and the bed he was assigned to upstairs was not ready and the floor also wanted him in a camera room, which is room 11, but pt placement reported that bed 11 was also not ready or clean and it would take another 45 minutes to clean the room. Pt and family updated.

## 2015-11-19 NOTE — ED Notes (Signed)
Per Critical Care team; patient is now a DNR/DNI. Waiting for neurology to consult before determining bed placement. Hold in ED until seen by neuro.

## 2015-11-19 NOTE — ED Notes (Signed)
Neurology stated to wait before taking him to floor; he may change bed assignment. Holding patient still in ED per [place], neuro MD.

## 2015-11-20 ENCOUNTER — Inpatient Hospital Stay (HOSPITAL_COMMUNITY): Payer: PPO

## 2015-11-20 ENCOUNTER — Other Ambulatory Visit: Payer: Self-pay | Admitting: Neurosurgery

## 2015-11-20 ENCOUNTER — Encounter (HOSPITAL_COMMUNITY)
Admission: EM | Disposition: A | Discharge status: Hospice | Payer: Self-pay | Source: Home / Self Care | Attending: Pulmonary Disease

## 2015-11-20 DIAGNOSIS — R509 Fever, unspecified: Secondary | ICD-10-CM

## 2015-11-20 DIAGNOSIS — E162 Hypoglycemia, unspecified: Secondary | ICD-10-CM

## 2015-11-20 DIAGNOSIS — D72829 Elevated white blood cell count, unspecified: Secondary | ICD-10-CM

## 2015-11-20 DIAGNOSIS — G911 Obstructive hydrocephalus: Secondary | ICD-10-CM | POA: Diagnosis present

## 2015-11-20 LAB — BASIC METABOLIC PANEL
ANION GAP: 11 (ref 5–15)
BUN: 54 mg/dL — ABNORMAL HIGH (ref 6–20)
CALCIUM: 8.9 mg/dL (ref 8.9–10.3)
CHLORIDE: 119 mmol/L — AB (ref 101–111)
CO2: 19 mmol/L — ABNORMAL LOW (ref 22–32)
CREATININE: 2.23 mg/dL — AB (ref 0.61–1.24)
GFR calc non Af Amer: 28 mL/min — ABNORMAL LOW (ref >60)
GFR, EST AFRICAN AMERICAN: 32 mL/min — AB (ref >60)
Glucose, Bld: 124 mg/dL — ABNORMAL HIGH (ref 65–99)
Potassium: 3.7 mmol/L (ref 3.5–5.1)
SODIUM: 149 mmol/L — AB (ref 135–145)

## 2015-11-20 LAB — HEPATIC FUNCTION PANEL
ALT: 238 U/L — ABNORMAL HIGH (ref 17–63)
AST: 189 U/L — AB (ref 15–41)
Albumin: 3 g/dL — ABNORMAL LOW (ref 3.5–5.0)
Alkaline Phosphatase: 225 U/L — ABNORMAL HIGH (ref 38–126)
BILIRUBIN DIRECT: 0.2 mg/dL (ref 0.1–0.5)
BILIRUBIN INDIRECT: 0.5 mg/dL (ref 0.3–0.9)
TOTAL PROTEIN: 7.4 g/dL (ref 6.5–8.1)
Total Bilirubin: 0.7 mg/dL (ref 0.3–1.2)

## 2015-11-20 LAB — GLUCOSE, CAPILLARY
GLUCOSE-CAPILLARY: 105 mg/dL — AB (ref 65–99)
GLUCOSE-CAPILLARY: 105 mg/dL — AB (ref 65–99)
GLUCOSE-CAPILLARY: 98 mg/dL (ref 65–99)
GLUCOSE-CAPILLARY: 111 mg/dL — AB (ref 65–99)
Glucose-Capillary: 107 mg/dL — ABNORMAL HIGH (ref 65–99)
Glucose-Capillary: 102 mg/dL — ABNORMAL HIGH (ref 65–99)

## 2015-11-20 LAB — CBC
HEMATOCRIT: 35.8 % — AB (ref 39.0–52.0)
HEMOGLOBIN: 11.2 g/dL — AB (ref 13.0–17.0)
MCH: 26.5 pg (ref 26.0–34.0)
MCHC: 31.3 g/dL (ref 30.0–36.0)
MCV: 84.8 fL (ref 78.0–100.0)
Platelets: 531 10*3/uL — ABNORMAL HIGH (ref 150–400)
RBC: 4.22 MIL/uL (ref 4.22–5.81)
RDW: 15.8 % — ABNORMAL HIGH (ref 11.5–15.5)
WBC: 19.9 10*3/uL — AB (ref 4.0–10.5)

## 2015-11-20 LAB — BLOOD GAS, ARTERIAL
ACID-BASE DEFICIT: 5.1 mmol/L — AB (ref 0.0–2.0)
Bicarbonate: 17.8 mEq/L — ABNORMAL LOW (ref 20.0–24.0)
Drawn by: 331761
O2 Content: 2 L/min
O2 SAT: 98.1 %
PATIENT TEMPERATURE: 99.3
TCO2: 18.6 mmol/L (ref 0–100)
pCO2 arterial: 24.8 mmHg — ABNORMAL LOW (ref 35.0–45.0)
pH, Arterial: 7.473 — ABNORMAL HIGH (ref 7.350–7.450)
pO2, Arterial: 110 mmHg — ABNORMAL HIGH (ref 80.0–100.0)

## 2015-11-20 LAB — URINE CULTURE: CULTURE: NO GROWTH

## 2015-11-20 LAB — TROPONIN I
Troponin I: 0.09 ng/mL — ABNORMAL HIGH (ref <0.031)
Troponin I: 0.07 ng/mL — ABNORMAL HIGH (ref <0.031)
Troponin I: 0.06 ng/mL — ABNORMAL HIGH (ref <0.031)

## 2015-11-20 LAB — PROTEIN AND GLUCOSE, CSF
Glucose, CSF: 31 mg/dL — ABNORMAL LOW (ref 40–70)
TOTAL PROTEIN, CSF: 174 mg/dL — AB (ref 15–45)

## 2015-11-20 LAB — CSF CELL COUNT WITH DIFFERENTIAL
Eosinophils, CSF: 0 % (ref 0–1)
LYMPHS CSF: 1 % — AB (ref 40–80)
MONOCYTE-MACROPHAGE-SPINAL FLUID: 2 % — AB (ref 15–45)
RBC COUNT CSF: 12500 /mm3 — AB
SEGMENTED NEUTROPHILS-CSF: 97 % — AB (ref 0–6)
WBC CSF: 960 /mm3 — AB (ref 0–5)

## 2015-11-20 LAB — PHOSPHORUS: PHOSPHORUS: 4.8 mg/dL — AB (ref 2.5–4.6)

## 2015-11-20 LAB — MAGNESIUM: MAGNESIUM: 2.8 mg/dL — AB (ref 1.7–2.4)

## 2015-11-20 SURGERY — SHUNT INSERTION VENTRICULAR-PERITONEAL
Anesthesia: General

## 2015-11-20 MED ORDER — SODIUM CHLORIDE 0.9 % IV SOLN
2.0000 g | Freq: Four times a day (QID) | INTRAVENOUS | Status: DC
  Administered 2015-11-20 – 2015-11-22 (×8): 2 g via INTRAVENOUS
  Filled 2015-11-20 (×10): qty 2000

## 2015-11-20 MED ORDER — CEFEPIME HCL 2 G IJ SOLR
2.0000 g | Freq: Two times a day (BID) | INTRAMUSCULAR | Status: DC
  Administered 2015-11-20 – 2015-11-28 (×16): 2 g via INTRAVENOUS
  Filled 2015-11-20 (×18): qty 2

## 2015-11-20 MED ORDER — DEXTROSE 5 % IV SOLN
INTRAVENOUS | Status: DC
  Administered 2015-11-20 – 2015-11-24 (×7): via INTRAVENOUS

## 2015-11-20 MED ORDER — CETYLPYRIDINIUM CHLORIDE 0.05 % MT LIQD
7.0000 mL | Freq: Two times a day (BID) | OROMUCOSAL | Status: DC
  Administered 2015-11-21 – 2015-12-02 (×21): 7 mL via OROMUCOSAL

## 2015-11-20 MED ORDER — AMIODARONE HCL IN DEXTROSE 360-4.14 MG/200ML-% IV SOLN
60.0000 mg/h | INTRAVENOUS | Status: AC
  Administered 2015-11-20: 60 mg/h via INTRAVENOUS
  Filled 2015-11-20: qty 200

## 2015-11-20 MED ORDER — CHLORHEXIDINE GLUCONATE 0.12 % MT SOLN
15.0000 mL | Freq: Two times a day (BID) | OROMUCOSAL | Status: DC
  Administered 2015-11-21 – 2015-12-02 (×21): 15 mL via OROMUCOSAL
  Filled 2015-11-20 (×16): qty 15

## 2015-11-20 MED ORDER — AMIODARONE HCL IN DEXTROSE 360-4.14 MG/200ML-% IV SOLN
30.0000 mg/h | INTRAVENOUS | Status: DC
  Administered 2015-11-20 – 2015-11-24 (×9): 30 mg/h via INTRAVENOUS
  Filled 2015-11-20 (×14): qty 200

## 2015-11-20 NOTE — Progress Notes (Signed)
Patient ID: Jordan [name], male   DOB: July 07, 1946, 70 y.o.   MRN: [id number] IVC came out. It drained only 3 cc at 10 cm of water. Clinically he is responding more to abs. i will continue to monitor him with ct and examination. Waiting for final csf culture.  VP down the line?

## 2015-11-20 NOTE — Consult Note (Signed)
[facility]       Reason for Consult: possible meningitis   Referring Physician: Dr. Isaiah Serge  Active Problems:   Altered mental status   Obstructive hydrocephalus   . ampicillin (OMNIPEN) IV  2 g Intravenous 4 times per day  . antiseptic oral rinse  7 mL Mouth Rinse BID  . ceFEPime (MAXIPIME) IV  2 g Intravenous Q12H  . insulin aspart  0-15 Units Subcutaneous 6 times per day  . pantoprazole (PROTONIX) IV  40 mg Intravenous QHS  . vancomycin  1,000 mg Intravenous Q24H    Recommendations: Continue with empiric antibiotics, I would not add or change anything   Assessment: He had a recent ICH with intraventricular extension and then returned with AMS, fever.  CSF studies suggest the possibility of infection with increased WBCs, low glucose.   CKD - creat better today compared to yesterday. Will need to monitor on vancomycin.   Antibiotics: Vancomycin, cefepime, ampicillin.   HPI: Jordan [name] is a 70 y.o. male with history of atrial fibrillation on coumadin previously and NSTEMI in December 2016 s/p CABG with MVR, tricuspid annuloplasty and then admitted for left intracerebral hemorrhage.  Sent to nursing home but 1/29 brought in to ED with fever, altered mental status and tachypnea.  CSF with 960 WBCs, 97% neutrophils, glucose of 31. History only from the chart.     Review of Systems:  Unable to be assessed due to mental status All other systems reviewed and are negative   Past Medical History  Diagnosis Date  . Hypertension   . Kidney stone   . Bipolar 1 disorder (HCC)   . Diverticulitis   . Anxiety   . Arthritis   . Sleep apnea   . Chronic headaches   . Stage III chronic kidney disease     Baseline creatinine 1.8  . Left inguinal hernia   . Atherosclerosis   . CAD (coronary artery disease)     a. 10/11/2015: total occlusion of OM1 and LCx, 50% stenosis in distal LM, 80% stenosis in LAD, 99% stenosis in RCA --> CABG recommended.  .  Hiatal hernia   . 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 Antritis (stomach)     Social History  Substance Use Topics  . Smoking status: Former Smoker -- 1.50 packs/day for 20 years    Quit date: 10/22/1983  . Smokeless tobacco: Never Used  . Alcohol Use: No    Family History  Problem Relation Age of Onset  . Anesthesia problems Neg Hx   . Heart attack Father   . Liver cancer Paternal Grandfather     unsure of other cancers   . Diabetes Sister     No Known Allergies  Physical Exam: Constitutional: in no apparent distress  Filed Vitals:   11/20/15 1400 11/20/15 1500  BP: 127/91 132/101  Pulse: 109 105  Temp:    Resp: 35 35   EYES: anicteric ENMT: no thrush Cardiovascular: Cor RRR Respiratory: CTA B; normal respiratory effort GI: Bowel sounds are normal, liver is not enlarged, spleen is not enlarged Musculoskeletal: no pedal edema noted Skin: negatives: no rash Hematologic: no cervical lad Neuro: did open eyes to command, otherwise not following  Lab Results  Component Value Date   WBC 19.9* 11/20/2015   HGB 11.2* 11/20/2015   HCT 35.8* 11/20/2015   MCV 84.8 11/20/2015   PLT 531* 11/20/2015    Lab Results  Component Value Date   CREATININE 2.23*  11/20/2015   BUN 54* 11/20/2015   NA 149* 11/20/2015   K 3.7 11/20/2015   CL 119* 11/20/2015   CO2 19* 11/20/2015    Lab Results  Component Value Date   ALT 238* 11/20/2015   AST 189* 11/20/2015   ALKPHOS 225* 11/20/2015     Microbiology: Recent Results (from the past 240 hour(s))  Culture, Urine     Status: None   Collection Time: 11/12/15 12:07 AM  Result Value Ref Range Status   Specimen Description URINE, RANDOM  Final   Special Requests none Normal  Final   Culture >=100,000 COLONIES/mL ESCHERICHIA COLI  Final   Report Status 11/14/2015 FINAL  Final   Organism ID, Bacteria ESCHERICHIA COLI  Final      Susceptibility   Escherichia coli - MIC*    AMPICILLIN >=32 RESISTANT Resistant      CEFAZOLIN <=4 SENSITIVE Sensitive     CEFTRIAXONE <=1 SENSITIVE Sensitive     CIPROFLOXACIN 1 SENSITIVE Sensitive     GENTAMICIN <=1 SENSITIVE Sensitive     IMIPENEM <=0.25 SENSITIVE Sensitive     NITROFURANTOIN <=16 SENSITIVE Sensitive     TRIMETH/SULFA >=320 RESISTANT Resistant     AMPICILLIN/SULBACTAM 16 INTERMEDIATE Intermediate     PIP/TAZO <=4 SENSITIVE Sensitive     * >=100,000 COLONIES/mL ESCHERICHIA COLI  Blood Culture (routine x 2)     Status: None (Preliminary result)   Collection Time: 11/19/15 10:27 AM  Result Value Ref Range Status   Specimen Description BLOOD LEFT ANTECUBITAL  Final   Special Requests BOTTLES DRAWN AEROBIC AND ANAEROBIC 5CC  Final   Culture NO GROWTH 1 DAY  Final   Report Status PENDING  Incomplete  Urine culture     Status: None   Collection Time: 11/19/15 10:31 AM  Result Value Ref Range Status   Specimen Description URINE, CATHETERIZED  Final   Special Requests NONE  Final   Culture NO GROWTH 1 DAY  Final   Report Status 11/20/2015 FINAL  Final  Blood Culture (routine x 2)     Status: None (Preliminary result)   Collection Time: 11/19/15 10:35 AM  Result Value Ref Range Status   Specimen Description BLOOD RIGHT ANTECUBITAL  Final   Special Requests BOTTLES DRAWN AEROBIC AND ANAEROBIC 5CC  Final   Culture NO GROWTH 1 DAY  Final   Report Status PENDING  Incomplete  CSF culture     Status: None (Preliminary result)   Collection Time: 11/20/15 12:14 PM  Result Value Ref Range Status   Specimen Description CSF  Final   Special Requests NONE  Final   Gram Stain   Final    FEW WBC PRESENT,BOTH PMN AND MONONUCLEAR NO ORGANISMS SEEN    Culture PENDING  Incomplete   Report Status PENDING  Incomplete    Jordan [name], Jordan Maduro, MD [facility] [email or web address] [phone] pager  [phone] cell 11/20/2015, 3:34 PM

## 2015-11-20 NOTE — Progress Notes (Addendum)
Subjective: Continues to be altered  Exam: Filed Vitals:   11/20/15 0700 11/20/15 0800  BP: 135/97 119/83  Pulse: 87 108  Temp:  99.3 F (37.4 C)  Resp: 41 25   Gen: In bed, NAD Resp: non-labored breathing, no acute distress Abd: soft, nt  Neuro: MS: does not open eyes.  VW:UJWJX eye slightly outwardly deviated compared to left Motor: withdraws L slightly more than R but does move all 4 ext.  Sensory:as above.   + meningismus  Pertinent Labs: abg CO2 31  Impression: 70 yo M with recent ICH and presenting with worsening mental status and fever. He was treated empirically for HCAP. I am concerned with his fever and enlarging ventricles that he may have meningitis and would favor starting empiric coverage for this. With a mass lesion an LP would have more risk than average. Also, due to the recent surgery, I would expect some inflammatory changes in the CSF. If cultures were negative it could be due to being sterilized with cefepime so I am not sure how much benefit we would get from doing an LP.   I will discuss the enlarged ventricles with neurosurgery, but I am not sure if the degree is really enough to fully explain his worsening. A fever alone certainly could be enough, even if not involving the CNS itself.   Recommendations: 1) will discuss with neurosurgery 2) start empiric antibiotic coverage, would recommend asking for empiric recommendations from ID.  3) will continue to follow.    Ritta Slot, MD [facility] [phone]  If 7pm- 7am, please page neurology on call as listed in AMION.   Discussed with neurosurgery, will proceed with IVC. He has been started on empiric coverage, but will at least get some idea with CSF sampling from the IVC placement. He may need more permanent shunt placement if no infection is identified.   This patient is critically ill and at significant risk of neurological worsening, death and care requires constant  monitoring of vital signs, hemodynamics,respiratory and cardiac monitoring, neurological assessment, discussion with family, other specialists and medical decision making of high complexity. I spent 50 minutes of neurocritical care time  in the care of  this patient.  Ritta Slot, MD [facility] [phone]  If 7pm- 7am, please page neurology on call as listed in AMION. 11/20/2015  11:16 AM

## 2015-11-20 NOTE — Progress Notes (Addendum)
PULMONARY / CRITICAL CARE MEDICINE   Name: Jordan [name] MRN: [id number] DOB: 1946/07/04    ADMISSION DATE:  11/19/2015  REFERRING MD:  Francene Castle [place] Pax)   CHIEF COMPLAINT:  Respiratory failure   HISTORY OF PRESENT ILLNESS:   70yo male with hx HTN, OSA, bipolar disorder, CKD III, AFib on coumadin, CAD with recent NSTEMI 12/16 with subsequent CABG at that time.  Just d/c 1/27 after admission for large ICH with intraventricular extension.  He had worsening neurologic status requiring intubation and ventriculostomy. Course was c/b aspiration PNA and he was d/c on Cipro.   He improved slowly and was d/c to SNF with residual aphasia and R hemiparesis.  On 1/29 he was brought to ER with fever 102, tachypnea and decline in mental status.  No reports of cough, hemoptysis, vomiting or diarrhea.  In ER had had persistent hypoxia and tachypnea and PCCM called to admit to ICU.   PAST MEDICAL HISTORY :  He  has a past medical history of Hypertension; Kidney stone; Bipolar 1 disorder (HCC); Diverticulitis; Anxiety; Arthritis; Sleep apnea; Chronic headaches; Stage III chronic kidney disease; Left inguinal hernia; Atherosclerosis; CAD (coronary artery disease); Hiatal hernia; Esophagitis versus Mallory-Weiss tear; and Antritis (stomach).  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  No Known Allergies  No current facility-administered medications on file prior to encounter.   Current Outpatient Prescriptions on File Prior to Encounter  Medication Sig  . amiodarone (PACERONE) 200 MG tablet Take 1 tablet  (200 mg total) by mouth daily.  Marland Kitchen atorvastatin (LIPITOR) 10 MG tablet Take 1 tablet (10 mg total) by mouth daily at 6 PM.  . ferrous sulfate 325 (65 FE) MG tablet Take 1 tablet (325 mg total) by mouth daily with breakfast. For one month then stop.  . furosemide (LASIX) 40 MG tablet Take 1 tablet (40 mg total) by mouth daily.  Marland Kitchen senna-docusate (SENOKOT-S) 8.6-50 MG tablet Take 1 tablet by mouth 2 (two) times daily.  . ciprofloxacin (CIPRO) 500 MG tablet Take 1 tablet (500 mg total) by mouth 2 (two) times daily.  . [DISCONTINUED] buPROPion (WELLBUTRIN SR) 150 MG 12 hr tablet Take 450 mg by mouth daily.    FAMILY HISTORY:  His indicated that his mother is alive. He indicated that his father is deceased. He indicated that all of his three sisters are alive. He indicated that his brother is deceased.   SOCIAL HISTORY: He  reports that he quit smoking about 32 years ago. He has never used smokeless tobacco. He reports that he does not drink alcohol or use illicit drugs.  REVIEW OF SYSTEMS:   Unable - as per HPI obtained from records.   SUBJECTIVE:  Still unresponsive.   VITAL SIGNS: BP 119/83 mmHg  Pulse 108  Temp(Src) 99.3 F (37.4 C) (Axillary)  Resp 25  Ht  (1.753 m)  Wt 191 lb 5.8 oz (86.8 kg)  BMI 28.25 kg/m2  SpO2 93%  HEMODYNAMICS:    VENTILATOR SETTINGS:    INTAKE / OUTPUT: I/O last 3 completed shifts: In: 850 [I.V.:850] Out: 600 [Urine:600]  PHYSICAL EXAMINATION: General:  Opens eyes to commands. Does not respond to questions. Neuro:  Somnolent, aphasic, minimally interactive, PERRL HEENT:  Moist mucus membranes, No  thyromegaly, JVD. Cardiovascular:  S1, S2, RRR Lungs:  Clear, no crackles, wheeze. Has intermittent periods of rapid breathing.  Abdomen:  Soft, + BS Musculoskeletal:  Warm and dry, Noedema   LABS:  BMET  Recent Labs Lab 11/14/15 1440 11/19/15 1027 11/20/15 0258  NA 138 142 149*  K 3.8 4.3 3.7  CL 104 109 119*  CO2 21* 18* 19*  BUN  36* 65* 54*  CREATININE 2.04* 2.85* 2.23*  GLUCOSE 167* 142* 124*    Electrolytes  Recent Labs Lab 11/14/15 1440 11/19/15 1027 11/20/15 0258  CALCIUM 8.8* 9.2 8.9  MG 2.4  --  2.8*  PHOS  --   --  4.8*    CBC  Recent Labs Lab 11/19/15 1027 11/20/15 0258  WBC 23.9* 19.9*  HGB 11.6* 11.2*  HCT 36.6* 35.8*  PLT 563* 531*    Coag's No results for input(s): APTT, INR in the last 168 hours.  Sepsis Markers  Recent Labs Lab 11/19/15 1027 11/19/15 1040  LATICACIDVEN  --  1.47  PROCALCITON 0.27  --     ABG  Recent Labs Lab 11/19/15 1103  PHART 7.406  PCO2ART 31.0*  PO2ART 66.0*    Liver Enzymes  Recent Labs Lab 11/19/15 1027 11/20/15 0258  AST 128* 189*  ALT 161* 238*  ALKPHOS 210* 225*  BILITOT 0.5 0.7  ALBUMIN 3.1* 3.0*    Cardiac Enzymes No results for input(s): TROPONINI, PROBNP in the last 168 hours.  Glucose  Recent Labs Lab 11/19/15 1353 11/19/15 2046 11/19/15 2307 11/20/15 0316 11/20/15 0724  GLUCAP 111* 118* 107* 105* 105*    Imaging Ct Head Wo Contrast  11/19/2015  CLINICAL DATA:  Intra cerebral hemorrhage, follow-up EXAM: CT HEAD WITHOUT CONTRAST TECHNIQUE: Contiguous axial images were obtained from the base of the skull through the vertex without intravenous contrast. COMPARISON:  11/15/2015 FINDINGS: Left intra cerebral hemorrhage in the left parietal region again noted measuring 4.4 x 2.8 cm compared to 4.9 x 2.4 cm previously, likely not significantly changed. Surrounding edema is stable. No evidence of new or progressive hemorrhage. Intraventricular hemorrhage in the left lateral ventricle is again noted. Evolutionary changes. Time mildly increased prominence of the ventricles. Chronic microvascular disease throughout the deep white matter. 3 mm of left-to-right midline shift. No acute calvarial abnormality. IMPRESSION: Left parietal intraparenchymal hemorrhage not significantly changed. Stable surrounding edema.  Intraventricular extension again noted. The ventricles are more prominent on today's study suggesting developing hydrocephalus. Slight left-to-right midline shift, 3 mm. Electronically Signed   By: Charlett Nose M.D.   On: 11/19/2015 13:05   Dg Chest Port 1 View  11/20/2015  CLINICAL DATA:  Respiratory failure. EXAM: PORTABLE CHEST 1 VIEW COMPARISON:  11/19/2015. FINDINGS: Prior CABG and cardiac valve replacement. Stable heart size. No pulmonary venous congestion. Low lung volumes with mild bibasilar atelectasis and/or infiltrates. No pleural effusion or pneumothorax. Prior cervical spine fusion . IMPRESSION: 1. Prior CABG. Prior cardiac valve replacement. Heart size is stable. 2. Low lung volumes with mild bibasilar atelectasis and/or infiltrates. No interim change. Electronically Signed   By: Maisie Fus  Register   On: 11/20/2015 07:20   Dg Chest Port 1 View  11/19/2015  CLINICAL DATA:  70 year old nursing home patient with acute onset of shortness of breath and fever which began earlier today. Recent hospitalization for intracranial hemorrhage, presumably hemorrhagic stroke, for which he was discharged 2 days ago. EXAM: PORTABLE CHEST 1 VIEW COMPARISON:  11/11/2015 and earlier. FINDINGS: Suboptimal inspiration accounts for crowded bronchovascular markings diffusely  and atelectasis in the bases, and accentuates the cardiac silhouette. Taking this into account, cardiac silhouette upper normal in size to slightly enlarged but stable. Prior CABG and aortic valve replacement. Lungs otherwise clear. No localized airspace consolidation. No pleural effusions. No pneumothorax. Normal pulmonary vascularity. IMPRESSION: Suboptimal inspiration accounts for mild bibasilar atelectasis. No acute cardiopulmonary disease otherwise. Electronically Signed   By: Hulan Saas M.D.   On: 11/19/2015 10:48     STUDIES:  CT head 1/29>>>  Left parietal intraparenchymal hemorrhage not significantly changed.  Stable surrounding  edema. Intraventricular extension again noted.  The ventricles are more prominent on today's study suggesting developing hydrocephalus. Slight left-to-right midline shift, 3 mm.  CULTURES: BC x 2 1/29>>> Urine 1/29>>>  ANTIBIOTICS: Vanc 1/29>>> Cefepime 1/29>>>  SIGNIFICANT EVENTS:   LINES/TUBES:   DISCUSSION: 70yo male with multiple medical problems and recent ICH in setting coumadin.  Admitted with fevers, tachypnea, mild hypoxemia and decline in mental status.  Treating empirically with broad spectrum abx but no obvious infection, normal lactate, CXR is unremarkable.   ASSESSMENT / PLAN:  PULMONARY Hypoxemia  Tachypnea - kussmaul respirations  Recent LLL PNA P:   Supplemental O2  Empiric abx as above   CARDIOVASCULAR HTN AFib  CAD - s/p CABG  P:  No anticoagulation PRN labetalol to keep SBP <170 Trend BNP  Hold home lasix for now with AKI  Hold home amio for now since he is not taking PO. May need gtt Lopressor PRN for afib  RENAL AKI on CKD III - baseline Scr ~1.7 P:   F/u chem  Start D5W for hypernatremia  GASTROINTESTINAL Abnormal LFT's  P:   Trend LFT's  NPO   HEMATOLOGIC Leukocytosis  P:  F/u CBC  SCD's   INFECTIOUS Fever, leukocytosis - no obvious source infection at this time.  CXR improving, u/a unimpressive.  Lactate normal.  P:   Empiric abx as above  Add ampicillin   ENDOCRINE Hyperglycemia   P:   SSI   NEUROLOGIC AMS -- concern for further neurologic event, meningitis given fever, tachypnea, decline in mental status with no obvious source of infection. ? Bacteremia  ICH - recent.  In setting coumadin.  CT head with no acute change. No seizures on CT P:   MRI head when more stable. Consider LP Hold sedating medications   FAMILY  Update: discussed at length by KW 1/29 with multiple family members including pt's wife and two children.  They feel strongly that he would NOT want "heroic measures" including ventilatory support,  CPR, Defib, vasopressors.  They are certainly hopeful for recovery but are very realistic and want to ensure his comfort and dignitiy as well.  Will continue with broad spectrum abx, await neuro consult, follow up culture data but no intubation if respiratory status worsens.     Code status reversed on 1/30. Family is now OK with resuscitation, intubation for temporary issues.   Critical care- 45 mins  Chilton Greathouse MD [facility] Pager [phone] If no answer or after 3pm call: [phone] 11/20/2015, 9:01 AM

## 2015-11-20 NOTE — Op Note (Signed)
[name], [name] NO.:  [id number]  MEDICAL RECORD NO.:  [id number]  LOCATION:  3M11C                        FACILITY:  [facility]  PHYSICIAN:  Hilda Lias, M.D.   DATE OF BIRTH:  [birth year]  DATE OF PROCEDURE:  11/20/2015 DATE OF DISCHARGE:                              OPERATIVE REPORT   PREOPERATIVE DIAGNOSIS:  Status post a hemorrhagic stroke. Hydrocephalus.  Rule out meningitis.  POSTOPERATIVE DIAGNOSIS:  Status post a hemorrhagic stroke. Hydrocephalus.  Rule out meningitis.  PROCEDURE:  Re-insertion of a new intraventricular catheter.  CLINICAL HISTORY:  The patient was re-admitted to the hospital because of decreased level of conscious.  He has stiffness of the neck.  CT scan of the head showed the possibility will increase of the ventricular size.  We were called by the neurologist, Dr. Petra Kuba for insertion of a new intraventricular catheter.  DESCRIPTION OF PROCEDURE:  The right side of the head was cleaned with DuraPrep and drapes were applied.  Infiltration away from the midline was done.  We selected an area lateral to the previous incision and the incision was made through the skin, all the way down to the bone.  A new hole was drilled into the right frontal bone and the dura mater was opened with an 18-gauge needle.  Then, the catheter was inserted into the right ventricular area.  The CSF was really a low-pressure.  The CSF was kind of bloody and sort of sandy.  However, the specimen was sent to the laboratory.  The catheter was brought through a different incision, was secured in place with nylon.  The patient is going to remain on the neurological service.  Right now, until we are 100% sure that there is no infection, we are going to stay away from the ventriculoperitoneal shunt.          ______________________________ Hilda Lias, M.D.     EB/MEDQ  D:  11/20/2015  T:  11/20/2015  Job:  [id number]

## 2015-11-20 NOTE — Progress Notes (Signed)
Dr. Amada Jupiter notified of critical WBC in CSF of 960. No new orders at this time. Will continue to monitor closely.

## 2015-11-20 NOTE — Progress Notes (Signed)
Pharmacy Antibiotic Follow-up Note  Jordan [name] is a 70 y.o. year-old male admitted on 11/19/2015.  The patient is currently on day 2 of cefepime and vancomycin for rule-out PNA. Patient's antibiotics to be dose adjusted/broaded now for rule out meningitis.   Assessment/Plan: -Continue Vancomycin 1 gm IV Q 24 hours -Adjust Cefepime 2 gm IV to q12 until meningitis r/o -Add ampicillin 2g IV q6h -Monitor CBC, renal fx, cultures and clinical progress -VT at SS   Temp (24hrs), Avg:98.9 F (37.2 C), Min:97.6 F (36.4 C), Max:100.3 F (37.9 C)   Recent Labs Lab 11/19/15 1027 11/20/15 0258  WBC 23.9* 19.9*    Recent Labs Lab 11/14/15 1440 11/19/15 1027 11/20/15 0258  CREATININE 2.04* 2.85* 2.23*   Estimated Creatinine Clearance: 33.1 mL/min (by C-G formula based on Cr of 2.23).    No Known Allergies  Antimicrobials this admission: Cefepime 1/29>> Vancomycin 1/29>> Ampicillin 1/30>>  Levels/dose changes this admission: None  Microbiology results: 1/29 Blood x 2  1/29 Urine:  Thank you for allowing pharmacy to be a part of this patient's care.  Isaac Bliss, PharmD, BCPS, [facility] Clinical Pharmacist Pager [phone] 11/20/2015 10:31 AM

## 2015-11-21 ENCOUNTER — Inpatient Hospital Stay (HOSPITAL_COMMUNITY): Payer: PPO

## 2015-11-21 ENCOUNTER — Other Ambulatory Visit: Payer: Self-pay | Admitting: Surgery

## 2015-11-21 DIAGNOSIS — Z951 Presence of aortocoronary bypass graft: Secondary | ICD-10-CM

## 2015-11-21 DIAGNOSIS — G009 Bacterial meningitis, unspecified: Secondary | ICD-10-CM | POA: Diagnosis present

## 2015-11-21 DIAGNOSIS — J9600 Acute respiratory failure, unspecified whether with hypoxia or hypercapnia: Secondary | ICD-10-CM

## 2015-11-21 LAB — GLUCOSE, CAPILLARY
GLUCOSE-CAPILLARY: 109 mg/dL — AB (ref 65–99)
GLUCOSE-CAPILLARY: 107 mg/dL — AB (ref 65–99)
GLUCOSE-CAPILLARY: 104 mg/dL — AB (ref 65–99)
Glucose-Capillary: 111 mg/dL — ABNORMAL HIGH (ref 65–99)
Glucose-Capillary: 114 mg/dL — ABNORMAL HIGH (ref 65–99)
Glucose-Capillary: 93 mg/dL (ref 65–99)
Glucose-Capillary: 96 mg/dL (ref 65–99)

## 2015-11-21 LAB — CBC
HCT: 37.3 % — ABNORMAL LOW (ref 39.0–52.0)
HEMOGLOBIN: 11.7 g/dL — AB (ref 13.0–17.0)
MCH: 26.5 pg (ref 26.0–34.0)
MCHC: 31.4 g/dL (ref 30.0–36.0)
MCV: 84.6 fL (ref 78.0–100.0)
Platelets: 524 10*3/uL — ABNORMAL HIGH (ref 150–400)
RBC: 4.41 MIL/uL (ref 4.22–5.81)
RDW: 15.8 % — ABNORMAL HIGH (ref 11.5–15.5)
WBC: 15.3 10*3/uL — ABNORMAL HIGH (ref 4.0–10.5)

## 2015-11-21 LAB — BASIC METABOLIC PANEL
Anion gap: 14 (ref 5–15)
BUN: 43 mg/dL — AB (ref 6–20)
CHLORIDE: 116 mmol/L — AB (ref 101–111)
CO2: 19 mmol/L — ABNORMAL LOW (ref 22–32)
Calcium: 9.1 mg/dL (ref 8.9–10.3)
Creatinine, Ser: 1.89 mg/dL — ABNORMAL HIGH (ref 0.61–1.24)
GFR calc Af Amer: 40 mL/min — ABNORMAL LOW (ref >60)
GFR calc non Af Amer: 34 mL/min — ABNORMAL LOW (ref >60)
Glucose, Bld: 114 mg/dL — ABNORMAL HIGH (ref 65–99)
POTASSIUM: 3.9 mmol/L (ref 3.5–5.1)
SODIUM: 149 mmol/L — AB (ref 135–145)

## 2015-11-21 LAB — MAGNESIUM: MAGNESIUM: 2.6 mg/dL — AB (ref 1.7–2.4)

## 2015-11-21 LAB — HERPES SIMPLEX VIRUS(HSV) DNA BY PCR
HSV 1 DNA: NEGATIVE
HSV 2 DNA: NEGATIVE

## 2015-11-21 LAB — PHOSPHORUS: PHOSPHORUS: 3.8 mg/dL (ref 2.5–4.6)

## 2015-11-21 LAB — LACTIC ACID, PLASMA: Lactic Acid, Venous: 1.7 mmol/L (ref 0.5–2.0)

## 2015-11-21 NOTE — Progress Notes (Signed)
[facility]   Reason for visit: Follow up on meningitis  Interval History: no growth to date.  Tmax 100.7, no reponse.   Physical Exam: Constitutional:  Filed Vitals:   11/21/15 0900 11/21/15 1000  BP: 136/97 139/98  Pulse: 108 107  Temp:    Resp: 32 34   patient appears in NAD; does not respond to commands Respiratory: Normal respiratory effort; CTA B Cardiovascular: RRR  Review of Systems: Unable to be assessed due to mental status  Lab Results  Component Value Date   WBC PENDING 11/21/2015   HGB 11.7* 11/21/2015   HCT 37.3* 11/21/2015   MCV 84.6 11/21/2015   PLT PENDING 11/21/2015    Lab Results  Component Value Date   CREATININE 1.89* 11/21/2015   BUN 43* 11/21/2015   NA 149* 11/21/2015   K 3.9 11/21/2015   CL 116* 11/21/2015   CO2 19* 11/21/2015    Lab Results  Component Value Date   ALT 238* 11/20/2015   AST 189* 11/20/2015   ALKPHOS 225* 11/20/2015     Microbiology: Recent Results (from the past 240 hour(s))  Culture, Urine     Status: None   Collection Time: 11/12/15 12:07 AM  Result Value Ref Range Status   Specimen Description URINE, RANDOM  Final   Special Requests none Normal  Final   Culture >=100,000 COLONIES/mL ESCHERICHIA COLI  Final   Report Status 11/14/2015 FINAL  Final   Organism ID, Bacteria ESCHERICHIA COLI  Final      Susceptibility   Escherichia coli - MIC*    AMPICILLIN >=32 RESISTANT Resistant     CEFAZOLIN <=4 SENSITIVE Sensitive     CEFTRIAXONE <=1 SENSITIVE Sensitive     CIPROFLOXACIN 1 SENSITIVE Sensitive     GENTAMICIN <=1 SENSITIVE Sensitive     IMIPENEM <=0.25 SENSITIVE Sensitive     NITROFURANTOIN <=16 SENSITIVE Sensitive     TRIMETH/SULFA >=320 RESISTANT Resistant     AMPICILLIN/SULBACTAM 16 INTERMEDIATE Intermediate     PIP/TAZO <=4 SENSITIVE Sensitive     * >=100,000 COLONIES/mL ESCHERICHIA COLI  Blood Culture (routine x 2)     Status: None (Preliminary result)   Collection Time:  11/19/15 10:27 AM  Result Value Ref Range Status   Specimen Description BLOOD LEFT ANTECUBITAL  Final   Special Requests BOTTLES DRAWN AEROBIC AND ANAEROBIC 5CC  Final   Culture NO GROWTH 1 DAY  Final   Report Status PENDING  Incomplete  Urine culture     Status: None   Collection Time: 11/19/15 10:31 AM  Result Value Ref Range Status   Specimen Description URINE, CATHETERIZED  Final   Special Requests NONE  Final   Culture NO GROWTH 1 DAY  Final   Report Status 11/20/2015 FINAL  Final  Blood Culture (routine x 2)     Status: None (Preliminary result)   Collection Time: 11/19/15 10:35 AM  Result Value Ref Range Status   Specimen Description BLOOD RIGHT ANTECUBITAL  Final   Special Requests BOTTLES DRAWN AEROBIC AND ANAEROBIC 5CC  Final   Culture NO GROWTH 1 DAY  Final   Report Status PENDING  Incomplete  Fungus Culture with Smear     Status: None (Preliminary result)   Collection Time: 11/20/15 12:14 PM  Result Value Ref Range Status   Specimen Description CSF  Final   Special Requests NONE  Final   Fungal Smear   Final    NO YEAST OR FUNGAL ELEMENTS SEEN Performed  at [facility]   Final    CULTURE IN PROGRESS FOR FOUR WEEKS Performed at [facility]    Report Status PENDING  Incomplete  CSF culture     Status: None (Preliminary result)   Collection Time: 11/20/15 12:14 PM  Result Value Ref Range Status   Specimen Description CSF  Final   Special Requests NONE  Final   Gram Stain   Final    FEW WBC PRESENT,BOTH PMN AND MONONUCLEAR NO ORGANISMS SEEN    Culture NO GROWTH < 24 HOURS  Final   Report Status PENDING  Incomplete    Impression/Plan:  1. Meningitis - no organisms to date and was on antibiotics prior.  Continue with vancomycin and cefepime.  Will stop ampicillin with low likelihood of listeria.   If no improvement in cell count will consider antifungal.   2. Encephalopathy - multifactorial. Continue treatment for #1.   Dr. Drue Second  to follow up tomorrow

## 2015-11-21 NOTE — Care Management Note (Signed)
Case Management Note  Patient Details  Name: Jordan [name] MRN: [id number] Date of Birth: January 21, 1946  Subjective/Objective:   Pt admitted on 11/19/15 with AMS, hypoxemic respiratory failure, meningitis.  PTA, pt was residing at skilled nursing facility.                   Action/Plan: CSW following to facilitate return to SNF upon dc when medically stable.  Will follow progress.    Expected Discharge Date:                  Expected Discharge Plan:  Skilled Nursing Facility  In-House Referral:  Clinical Social Work  Discharge planning Services  CM Consult  Post Acute Care Choice:    Choice offered to:     DME Arranged:    DME Agency:     HH Arranged:    HH Agency:     Status of Service:  In process, will continue to follow  Medicare Important Message Given:    Date Medicare IM Given:    Medicare IM give by:    Date Additional Medicare IM Given:    Additional Medicare Important Message give by:     If discussed at Long Length of Stay Meetings, dates discussed:    Additional Comments:  Quintella Baton, RN, BSN  Trauma/Neuro ICU Case Manager [phone]

## 2015-11-21 NOTE — Consult Note (Signed)
   [facility] Inpatient Consult   11/21/2015  Cortlandt Capuano [facility] [birth year] [phone] Patient recently returned to the hospital with fever and altered mental status.  Patient is being screened for re-admission and multiple admissions in the past 6 months for [facility] Care Management needs.  Patient is eligible for [facility] Care Management services with his [employer] Medicare Plan.  This Clinical research associate will follow for progression and disposition with the inpatient care manager and needs identified. For questions, please contact: Charlesetta Shanks, RN BSN CCM [facility]  [phone] business mobile phone Toll free office [phone]

## 2015-11-21 NOTE — Progress Notes (Signed)
Patient ID: Jordan [name], male   DOB: [birth year], 70 y.o.   MRN: [id number] More awake, f/c off and on. Waiting for final csf lab.

## 2015-11-21 NOTE — [place] FL2 (Signed)
[place] MEDICAID FL2 LEVEL OF CARE SCREENING TOOL     IDENTIFICATION  Patient Name: Jordan [name] Birthdate: Feb 20, 1946 Sex: male Admission Date (Current Location): 11/19/2015  [facility] and [place] Number:  Producer, television/film/video and Address:  [facility], [address], [place], [place] [address]      Provider Number: [id number]  Attending Physician Name and Address:  Nyoka Cowden, MD  Relative Name and Phone Number:  Jestin Burbach [phone]    Current Level of Care: Hospital Recommended Level of Care: Skilled Nursing Facility Prior Approval Number:    Date Approved/Denied:   PASRR Number:    Discharge Plan: SNF    Current Diagnoses: Patient Active Problem List   Diagnosis Date Noted  . Obstructive hydrocephalus   . Altered mental status 11/19/2015  . Acute respiratory failure with hypoxia (HCC)   . Nontraumatic subcortical hemorrhage of left cerebral hemisphere (HCC)   . Essential hypertension   . Atrial fibrillation (HCC)   . ICH (intracerebral hemorrhage) (HCC) 11/05/2015  . Respiratory failure (HCC)   . Postoperative anemia due to acute blood loss 10/30/2015  . CAD (coronary artery disease)   . S/P CABG (coronary artery bypass graft)   . S/P CABG x 4   . Tricuspid valve regurgitation 10/13/2015  . ACS (acute coronary syndrome) (HCC)   . Acute congestive heart failure (HCC)   . Atrial fibrillation with RVR (HCC) 10/10/2015  . Demand ischemia (HCC) 10/09/2015  . Non-STEMI (non-ST elevated myocardial infarction) (HCC) 10/08/2015  . Acute diastolic (congestive) heart failure (HCC) 10/08/2015  . Stage III chronic kidney disease 05/06/2015  . Hiatal hernia 05/06/2015  . Cervical spondylosis with radiculopathy 12/04/2012  . Hyperlipidemia 11/21/2007  . Obstructive sleep apnea 11/21/2007  . Hypertensive heart disease with CHF (congestive heart failure) (HCC)     Orientation RESPIRATION BLADDER Height & Weight     Self  O2 (4L)  Incontinent Weight: 184 lb 8.4 oz (83.7 kg) Height:   (175.3 cm)  BEHAVIORAL SYMPTOMS/MOOD NEUROLOGICAL BOWEL NUTRITION STATUS   (n/a)  (n/a) Continent Diet (NPO at this time)  AMBULATORY STATUS COMMUNICATION OF NEEDS Skin   Total Care Does not communicate Normal                       Personal Care Assistance Level of Assistance  Total care Bathing Assistance: Maximum assistance Feeding assistance: Maximum assistance Dressing Assistance: Maximum assistance Total Care Assistance: Maximum assistance   Functional Limitations Info  Sight, Hearing, Speech Sight Info: Adequate Hearing Info: Adequate Speech Info: Impaired (non verbal)    SPECIAL CARE FACTORS FREQUENCY  PT (By licensed PT), OT (By licensed OT), Speech therapy     PT Frequency: 5x/week OT Frequency: 5x/week     Speech Therapy Frequency: 5x/week      Contractures Contractures Info: Not present    Additional Factors Info  Code Status, Allergies, Insulin Sliding Scale Code Status Info: FULL CODE Allergies Info: NKDA   Insulin Sliding Scale Info: 0-15 units 6x/day       Current Medications (11/21/2015):  This is the current hospital active medication list Current Facility-Administered Medications  Medication Dose Route Frequency Provider Last Rate Last Dose  . 0.9 %  sodium chloride infusion   Intravenous Continuous Nelva Nay, MD   Stopped at 11/19/15 1400  . 0.9 %  sodium chloride infusion   Intravenous Continuous Bernadene Person, NP   Stopped at 11/20/15 1100  .  amiodarone (NEXTERONE PREMIX) 360 MG/200ML (1.8 mg/mL) IV infusion  30 mg/hr Intravenous Continuous Praveen Mannam, MD 16.7 mL/hr at 11/21/15 0234 30 mg/hr at 11/21/15 0234  . ampicillin (OMNIPEN) 2 g in sodium chloride 0.9 % 50 mL IVPB  2 g Intravenous 4 times per day Bertram Millard, RPH   2 g at 11/21/15 6578  . antiseptic oral rinse (CPC / CETYLPYRIDINIUM CHLORIDE 0.05%) solution 7 mL  7 mL Mouth Rinse q12n4p Nyoka Cowden, MD       . ceFEPIme (MAXIPIME) 2 g in dextrose 5 % 50 mL IVPB  2 g Intravenous Q12H Bertram Millard, RPH   2 g at 11/20/15 2122  . chlorhexidine (PERIDEX) 0.12 % solution 15 mL  15 mL Mouth Rinse BID Nyoka Cowden, MD   0 mL at 11/20/15 2200  . dextrose 5 % solution   Intravenous Continuous Praveen Mannam, MD 75 mL/hr at 11/21/15 0453    . insulin aspart (novoLOG) injection 0-15 Units  0-15 Units Subcutaneous 6 times per day Bernadene Person, NP   0 Units at 11/19/15 1357  . metoprolol (LOPRESSOR) injection 2.5-5 mg  2.5-5 mg Intravenous Q3H PRN Nyoka Cowden, MD   5 mg at 11/20/15 0707  . pantoprazole (PROTONIX) injection 40 mg  40 mg Intravenous QHS Bernadene Person, NP   40 mg at 11/20/15 2114  . vancomycin (VANCOCIN) IVPB 1000 mg/200 mL premix  1,000 mg Intravenous Q24H Candis Schatz Mancheril, RPH   1,000 mg at 11/20/15 1216     Discharge Medications: Please see discharge summary for a list of discharge medications.  Relevant Imaging Results:  Relevant Lab Results:   Additional Information    Jenita Seashore BSW Intern, [phone]

## 2015-11-21 NOTE — Progress Notes (Signed)
Subjective: EVD performed yesterday, under low pressure. Pulled out later that afternoon.   Exam: Filed Vitals:   11/21/15 0600 11/21/15 0700  BP: 135/110 138/112  Pulse: 108 36  Temp:    Resp: 20 23   Gen: In bed, shivering Resp: non-labored breathing, no acute distress Abd: soft, nt  Neuro: MS: opens eyes to voice. Appears to wiggle toes  CN: PERRL, fixates and tracks, eyes dysconjugate Motor: he moves all extremities spontaneously R< L.  Sensory: responds to nox stim x 4.   Pertinent Labs: Creatinine improving.  CSF 960 WBC, 12500 RBC, 97% neutrophils.    Impression: 70 yo M with worsening mental status/fever/meningismus following discharge after hospitalization with hydrocephalus requiring EVD. His CT was suggestive of hydrocephalus and given his clinical status, an EVD was placed again yesterday. It was under low pressure, but CSF was sampled showing a very large pleocytosis(far outside what could be accounted for by RBC) that is neutrophilic predominant.   Given the clinical picture of fever, neutrophilic predominant CSF, and meningismus. I suspect that this represents a bacterial meningitis. He did receive cefepime about 18 hours prior to teh CSF sample and therefore I would not be surprised that it would be sterilized. He has been started on antibiotics.   He unfortunately was able to pull out his EVD, but it was under low pressure when started and therefore Dr. Jeral Fruit chose to hold off on restarting it for now.   Recommendations: 1) Appreciate the assistance of Dr. Jeral Fruit.  2) continue empiric antibiotics.  3) Will continue to follow.   Ritta Slot, MD [facility] [phone]  If 7pm- 7am, please page neurology on call as listed in AMION.

## 2015-11-21 NOTE — Progress Notes (Signed)
PULMONARY / CRITICAL CARE MEDICINE   Name: Jordan [name] MRN: [id number] DOB: 05/11/1946    ADMISSION DATE:  11/19/2015  REFERRING MD:  Francene Castle Radford Pax)   CHIEF COMPLAINT:  Respiratory failure   HISTORY OF PRESENT ILLNESS:   70yo male with hx HTN, OSA, bipolar disorder, CKD III, AFib on coumadin, CAD with recent NSTEMI 12/16 with subsequent CABG at that time.  Just d/c 1/27 after admission for large ICH with intraventricular extension.  He had worsening neurologic status requiring intubation and ventriculostomy. Course was c/b aspiration PNA and he was d/c on Cipro.   He improved slowly and was d/c to SNF with residual aphasia and R hemiparesis.  On 1/29 he was brought to ER with fever 102, tachypnea and decline in mental status.  No reports of cough, hemoptysis, vomiting or diarrhea.  In ER had had persistent hypoxia and tachypnea and PCCM called to admit to ICU.   PAST MEDICAL HISTORY :  He  has a past medical history of Hypertension; Kidney stone; Bipolar 1 disorder (HCC); Diverticulitis; Anxiety; Arthritis; Sleep apnea; Chronic headaches; Stage III chronic kidney disease; Left inguinal hernia; Atherosclerosis; CAD (coronary artery disease); Hiatal hernia; Esophagitis versus Mallory-Weiss tear; and Antritis (stomach).  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  No Known Allergies  No current facility-administered medications on file prior to encounter.   Current Outpatient Prescriptions on File Prior to Encounter  Medication Sig  . amiodarone (PACERONE) 200 MG tablet Take 1 tablet  (200 mg total) by mouth daily.  Marland Kitchen atorvastatin (LIPITOR) 10 MG tablet Take 1 tablet (10 mg total) by mouth daily at 6 PM.  . ferrous sulfate 325 (65 FE) MG tablet Take 1 tablet (325 mg total) by mouth daily with breakfast. For one month then stop.  . furosemide (LASIX) 40 MG tablet Take 1 tablet (40 mg total) by mouth daily.  Marland Kitchen senna-docusate (SENOKOT-S) 8.6-50 MG tablet Take 1 tablet by mouth 2 (two) times daily.  . ciprofloxacin (CIPRO) 500 MG tablet Take 1 tablet (500 mg total) by mouth 2 (two) times daily.  . [DISCONTINUED] buPROPion (WELLBUTRIN SR) 150 MG 12 hr tablet Take 450 mg by mouth daily.    FAMILY HISTORY:  His indicated that his mother is alive. He indicated that his father is deceased. He indicated that all of his three sisters are alive. He indicated that his brother is deceased.   SOCIAL HISTORY: He  reports that he quit smoking about 32 years ago. He has never used smokeless tobacco. He reports that he does not drink alcohol or use illicit drugs.  REVIEW OF SYSTEMS:   Unable - as per HPI obtained from records.   SUBJECTIVE:  Mental status better.  VITAL SIGNS: BP 136/97 mmHg  Pulse 108  Temp(Src) 97.4 F (36.3 C) (Axillary)  Resp 32  Ht  (1.753 m)  Wt 184 lb 8.4 oz (83.7 kg)  BMI 27.24 kg/m2  SpO2 96%  HEMODYNAMICS:    VENTILATOR SETTINGS:    INTAKE / OUTPUT: I/O last 3 completed shifts: In: 3265.9 [I.V.:2815.9; IV Piggyback:450] Out: 1420 [Urine:1420]  PHYSICAL EXAMINATION: General:  Opens eyes to commands. Does not respond to questions. Neuro:  Somnolent, aphasic, minimally interactive, PERRL HEENT:  Moist mucus  membranes, No thyromegaly, JVD. Cardiovascular:  S1, S2, RRR Lungs:  Clear, no crackles, wheeze. Has intermittent periods of rapid breathing.  Abdomen:  Soft, + BS Musculoskeletal:  Warm and dry, Noedema   LABS:  BMET  Recent Labs Lab 11/19/15 1027 11/20/15 0258 11/21/15 0645  NA 142 149* 149*  K 4.3 3.7 3.9  CL 109 119*  116*  CO2 18* 19* 19*  BUN 65* 54* 43*  CREATININE 2.85* 2.23* 1.89*  GLUCOSE 142* 124* 114*    Electrolytes  Recent Labs Lab 11/14/15 1440 11/19/15 1027 11/20/15 0258 11/21/15 0645  CALCIUM 8.8* 9.2 8.9 9.1  MG 2.4  --  2.8* 2.6*  PHOS  --   --  4.8* 3.8    CBC  Recent Labs Lab 11/19/15 1027 11/20/15 0258 11/21/15 0645  WBC 23.9* 19.9* PENDING  HGB 11.6* 11.2* 11.7*  HCT 36.6* 35.8* 37.3*  PLT 563* 531* PENDING    Coag's No results for input(s): APTT, INR in the last 168 hours.  Sepsis Markers  Recent Labs Lab 11/19/15 1027 11/19/15 1040 11/21/15 0345  LATICACIDVEN  --  1.47 1.7  PROCALCITON 0.27  --   --     ABG  Recent Labs Lab 11/19/15 1103 11/20/15 1016  PHART 7.406 7.473*  PCO2ART 31.0* 24.8*  PO2ART 66.0* 110*    Liver Enzymes  Recent Labs Lab 11/19/15 1027 11/20/15 0258  AST 128* 189*  ALT 161* 238*  ALKPHOS 210* 225*  BILITOT 0.5 0.7  ALBUMIN 3.1* 3.0*    Cardiac Enzymes  Recent Labs Lab 11/20/15 1122 11/20/15 1947 11/20/15 2208  TROPONINI 0.09* 0.07* 0.06*    Glucose  Recent Labs Lab 11/20/15 1125 11/20/15 1525 11/20/15 1933 11/20/15 2346 11/21/15 0410 11/21/15 0758  GLUCAP 98 102* 111* 111* 109* 107*    Imaging Dg Chest Port 1 View  11/21/2015  CLINICAL DATA:  Acute respiratory failure. EXAM: PORTABLE CHEST 1 VIEW COMPARISON:  11/20/2015 FINDINGS: Postsurgical changes from prior median sternotomy, CABG and valvular replacement are stable. Cervical spine fusion is also seen. Cardiomediastinal silhouette is normal. Mediastinal contours appear intact. There is no evidence of focal airspace consolidation, pleural effusion or pneumothorax. Lung volumes are low. Osseous structures are without acute abnormality. Soft tissues are grossly normal. IMPRESSION: Low lung volumes with bibasilar atelectasis. Stable postsurgical changes. Electronically Signed   By: Ted Mcalpine M.D.   On: 11/21/2015 07:33      STUDIES:  CT head 1/29>>>  Left parietal intraparenchymal hemorrhage not significantly changed.  Stable surrounding edema. Intraventricular extension again noted.  The ventricles are more prominent on today's study suggesting developing hydrocephalus. Slight left-to-right midline shift, 3 mm.  CULTURES: BC x 2 1/29>>> Urine 1/29>>> CSF 1/30 >>>  ANTIBIOTICS: Vanc 1/29>> Cefepime 1/29>> Ampicilln  1/30>>  SIGNIFICANT EVENTS:   LINES/TUBES:   DISCUSSION: 70yo male with multiple medical problems and recent ICH in setting coumadin.  Admitted with fevers, tachypnea, mild hypoxemia and decline in mental status.  CSF c/w acute bacterial meningitis.  ASSESSMENT / PLAN:  PULMONARY Hypoxemia  Tachypnea -Cheyne-Stokes respirations  Recent LLL PNA P:   Supplemental O2  Empiric abx as above   CARDIOVASCULAR HTN AFib  CAD - s/p CABG  P:  No anticoagulation PRN labetalol to keep SBP <170 Trend BNP  Hold home lasix for now with AKI  Amio gtt for afib. Lopressor PRN for afib  RENAL AKI on CKD III - baseline Scr ~1.7 P:   F/u chem  D5W for hypernatremia. Increase to  100cc/hr.  GASTROINTESTINAL Abnormal LFT's  P:   Trend LFT's  NPO   HEMATOLOGIC Leukocytosis  P:  F/u CBC  SCD's   INFECTIOUS Fever, leukocytosis Meningitis  P:   Empiric abx as above  Vanco, cefipime, ampicillin.  ENDOCRINE Hyperglycemia   P:   SSI   NEUROLOGIC AMS -- concern for further neurologic event, meningitis given fever, tachypnea, decline in mental status with no obvious source of infection. ? Bacteremia  ICH - recent.  In setting coumadin.  CT head with no acute change. No seizures on CT P:   MRI head when more stable. Hold sedating medications   FAMILY  Update: discussed at length by KW 1/29 with multiple family members including pt's wife and two children.  They feel strongly that he would NOT want "heroic measures" including ventilatory support, CPR, Defib,  vasopressors.  They are certainly hopeful for recovery but are very realistic and want to ensure his comfort and dignitiy as well.  Will continue with broad spectrum abx, await neuro consult, follow up culture data but no intubation if respiratory status worsens.     Code status reversed on 1/30. Family is now OK with resuscitation, intubation for temporary issues.   Critical care- 45 mins  Chilton Greathouse MD [facility] Pager [phone] If no answer or after 3pm call: [phone] 11/21/2015, 10:04 AM

## 2015-11-21 NOTE — Progress Notes (Signed)
Initial Nutrition Assessment  DOCUMENTATION CODES:   Not applicable  INTERVENTION:   -RD will follow for diet advancement and supplement as approprite  NUTRITION DIAGNOSIS:   Inadequate oral intake related to inability to eat as evidenced by NPO status.  GOAL:   Patient will meet greater than or equal to 90% of their needs  MONITOR:   Diet advancement, Labs, Weight trends, Skin, I & O's  REASON FOR ASSESSMENT:   Low Braden    ASSESSMENT:   Unfortunate 70 year old male with history of atrial fibrillation on Coumadin. 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 He was left with some residual aphasia and right-sided weakness on discharge to skilled nursing facility on 1/27. Patient presents to hospital today with a decline in his mental status, tachypnea, and febrile. In the emergency department he was found to have what appeared to be Cheyne-Stokes breathing. He intermittently did open his eyes and resist some elements of exam but did not attempt to respond to questions or follow commands.  Pt admitted with acute encephalopathy. Per chart review, pt was recently discharged after hospitalization for ICH on 11/17/15 to [facility].   No family at bedside. Pt did not arouse or respond to name being called or during exam. Spoke with RN, who reports potential for swallow evaluation either later today or tomorrow, 11/22/15. Diet order PTA was dysphagia 3 with thin liquids.   Wt hx reveals generalized fluctuations. Noted a 28# (13%) wt loss x 1, however, question accuracy of weight hx.   Nutrition-Focused physical exam completed. Findings are mild fat depletion, mild muscle depletion, and no edema. It is difficult to determine if wasting is related to suboptimal nutrition status vs impaired mobility vs advanced age.     Per neuro note on 11/20/15, pt pulled EVD, but replacement has been held off.   Per CCM note, family does not want any heroic measures, but hopeful for improvement.   Labs reviewed: Na: 149, Mg: 2.6, CBGS: 107-109.   Diet Order:  Diet NPO time specified  Skin:  Reviewed, no issues  Last BM:  PTA  Height:   Ht Readings from Last 1 Encounters:  11/19/15  (1.753 m)    Weight:   Wt Readings from Last 1 Encounters:  11/21/15 184 lb 8.4 oz (83.7 kg)    Ideal Body Weight:  72.7 kg  BMI:  Body mass index is 27.24 kg/(m^2).  Estimated Nutritional Needs:   Kcal:  1650-1850  Protein:  85-95 grams  Fluid:  1.6-1.8 L  EDUCATION NEEDS:   No education needs identified at this time  Jesusita Jocelyn A. Mayford Knife, RD, LDN, CDE Pager: [phone] After hours Pager: [phone]

## 2015-11-22 ENCOUNTER — Inpatient Hospital Stay (HOSPITAL_COMMUNITY): Payer: PPO

## 2015-11-22 ENCOUNTER — Ambulatory Visit: Payer: Self-pay | Admitting: Surgery

## 2015-11-22 DIAGNOSIS — Z452 Encounter for adjustment and management of vascular access device: Secondary | ICD-10-CM | POA: Diagnosis present

## 2015-11-22 DIAGNOSIS — I610 Nontraumatic intracerebral hemorrhage in hemisphere, subcortical: Secondary | ICD-10-CM

## 2015-11-22 DIAGNOSIS — J9600 Acute respiratory failure, unspecified whether with hypoxia or hypercapnia: Secondary | ICD-10-CM

## 2015-11-22 DIAGNOSIS — R400 Somnolence: Secondary | ICD-10-CM

## 2015-11-22 LAB — BASIC METABOLIC PANEL
ANION GAP: 15 (ref 5–15)
BUN: 37 mg/dL — ABNORMAL HIGH (ref 6–20)
CALCIUM: 8.8 mg/dL — AB (ref 8.9–10.3)
CO2: 20 mmol/L — AB (ref 22–32)
Chloride: 113 mmol/L — ABNORMAL HIGH (ref 101–111)
Creatinine, Ser: 1.8 mg/dL — ABNORMAL HIGH (ref 0.61–1.24)
GFR calc Af Amer: 42 mL/min — ABNORMAL LOW (ref >60)
GFR calc non Af Amer: 36 mL/min — ABNORMAL LOW (ref >60)
GLUCOSE: 115 mg/dL — AB (ref 65–99)
Potassium: 3.5 mmol/L (ref 3.5–5.1)
Sodium: 148 mmol/L — ABNORMAL HIGH (ref 135–145)

## 2015-11-22 LAB — GLUCOSE, CAPILLARY
GLUCOSE-CAPILLARY: 96 mg/dL (ref 65–99)
GLUCOSE-CAPILLARY: 93 mg/dL (ref 65–99)
Glucose-Capillary: 94 mg/dL (ref 65–99)
Glucose-Capillary: 109 mg/dL — ABNORMAL HIGH (ref 65–99)
Glucose-Capillary: 99 mg/dL (ref 65–99)

## 2015-11-22 LAB — CBC
HEMATOCRIT: 33.9 % — AB (ref 39.0–52.0)
Hemoglobin: 10.4 g/dL — ABNORMAL LOW (ref 13.0–17.0)
MCH: 26.3 pg (ref 26.0–34.0)
MCHC: 30.7 g/dL (ref 30.0–36.0)
MCV: 85.6 fL (ref 78.0–100.0)
Platelets: 497 10*3/uL — ABNORMAL HIGH (ref 150–400)
RBC: 3.96 MIL/uL — ABNORMAL LOW (ref 4.22–5.81)
RDW: 15.8 % — AB (ref 11.5–15.5)
WBC: 11.3 10*3/uL — AB (ref 4.0–10.5)

## 2015-11-22 LAB — MAGNESIUM: Magnesium: 2.4 mg/dL (ref 1.7–2.4)

## 2015-11-22 LAB — PHOSPHORUS: Phosphorus: 3.9 mg/dL (ref 2.5–4.6)

## 2015-11-22 MED ORDER — VITAL AF 1.2 CAL PO LIQD
1000.0000 mL | ORAL | Status: DC
  Administered 2015-11-22 – 2015-11-27 (×5): 1000 mL
  Filled 2015-11-22 (×9): qty 1000

## 2015-11-22 NOTE — Progress Notes (Signed)
PULMONARY / CRITICAL CARE MEDICINE   Name: Jordan [name] MRN: [id number] DOB: [birth year]    ADMISSION DATE:  11/19/2015  REFERRING MD:  Francene Castle Radford Pax)   CHIEF COMPLAINT:  Respiratory failure   HISTORY OF PRESENT ILLNESS:   70yo male with hx HTN, OSA, bipolar disorder, CKD III, AFib on coumadin, CAD with recent NSTEMI 12/16 with subsequent CABG at that time.  Just d/c 1/27 after admission for large ICH with intraventricular extension.  He had worsening neurologic status requiring intubation and ventriculostomy. Course was c/b aspiration PNA and he was d/c on Cipro.   He improved slowly and was d/c to SNF with residual aphasia and R hemiparesis.  On 1/29 he was brought to ER with fever 102, tachypnea and decline in mental status.  No reports of cough, hemoptysis, vomiting or diarrhea.  In ER had had persistent hypoxia and tachypnea and PCCM called to admit to ICU.   PAST MEDICAL HISTORY :  He  has a past medical history of Hypertension; Kidney stone; Bipolar 1 disorder (HCC); Diverticulitis; Anxiety; Arthritis; Sleep apnea; Chronic headaches; Stage III chronic kidney disease; Left inguinal hernia; Atherosclerosis; CAD (coronary artery disease); Hiatal hernia; Esophagitis versus Mallory-Weiss tear; and Antritis (stomach).  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  No Known Allergies  No current facility-administered medications on file prior to encounter.   Current Outpatient Prescriptions on File Prior to Encounter  Medication Sig  . amiodarone (PACERONE) 200 MG tablet Take 1 tablet  (200 mg total) by mouth daily.  Marland Kitchen atorvastatin (LIPITOR) 10 MG tablet Take 1 tablet (10 mg total) by mouth daily at 6 PM.  . ferrous sulfate 325 (65 FE) MG tablet Take 1 tablet (325 mg total) by mouth daily with breakfast. For one month then stop.  . furosemide (LASIX) 40 MG tablet Take 1 tablet (40 mg total) by mouth daily.  Marland Kitchen senna-docusate (SENOKOT-S) 8.6-50 MG tablet Take 1 tablet by mouth 2 (two) times daily.  . ciprofloxacin (CIPRO) 500 MG tablet Take 1 tablet (500 mg total) by mouth 2 (two) times daily.  . [DISCONTINUED] buPROPion (WELLBUTRIN SR) 150 MG 12 hr tablet Take 450 mg by mouth daily.    FAMILY HISTORY:  His indicated that his mother is alive. He indicated that his father is deceased. He indicated that all of his three sisters are alive. He indicated that his brother is deceased.   SOCIAL HISTORY: He  reports that he quit smoking about 32 years ago. He has never used smokeless tobacco. He reports that he does not drink alcohol or use illicit drugs.  REVIEW OF SYSTEMS:   Unable - as per HPI obtained from records.   SUBJECTIVE:  No change in mental status.  VITAL SIGNS: BP 107/81 mmHg  Pulse 98  Temp(Src) 97.2 F (36.2 C) (Oral)  Resp 39  Ht 5\' 9"  (1.753 m)  Wt 190 lb 11.2 oz (86.5 kg)  BMI 28.15 kg/m2  SpO2 95%  HEMODYNAMICS:    VENTILATOR SETTINGS:    INTAKE / OUTPUT: I/O last 3 completed shifts: In: 4259.5 [I.V.:3809.5; IV Piggyback:450] Out: 1560 [Urine:1560]  PHYSICAL EXAMINATION: General:  Opens eyes to commands. Does not respond to questions. Neuro:  Somnolent, aphasic, minimally interactive, PERRL HEENT:  Moist mucus membranes, No thyromegaly, JVD. Cardiovascular:  S1, S2, RRR Lungs:  Clear, no crackles, wheeze. Has intermittent periods of rapid breathing.  Abdomen:  Soft, + BS Musculoskeletal:  Warm and dry, Noedema   LABS:  BMET  Recent Labs Lab 11/20/15 0258 11/21/15 0645 11/22/15 0550  NA 149* 149* 148*  K 3.7 3.9 3.5  CL 119*  116* 113*  CO2 19* 19* 20*  BUN 54* 43* 37*  CREATININE 2.23* 1.89* 1.80*  GLUCOSE 124* 114* 115*    Electrolytes  Recent Labs Lab 11/20/15 0258 11/21/15 0645 11/22/15 0550  CALCIUM 8.9 9.1 8.8*  MG 2.8* 2.6* 2.4  PHOS 4.8* 3.8 3.9    CBC  Recent Labs Lab 11/20/15 0258 11/21/15 0645 11/22/15 0550  WBC 19.9* 15.3* 11.3*  HGB 11.2* 11.7* 10.4*  HCT 35.8* 37.3* 33.9*  PLT 531* 524* 497*    Coag's No results for input(s): APTT, INR in the last 168 hours.  Sepsis Markers  Recent Labs Lab 11/19/15 1027 11/19/15 1040 11/21/15 0345  LATICACIDVEN  --  1.47 1.7  PROCALCITON 0.27  --   --     ABG  Recent Labs Lab 11/19/15 1103 11/20/15 1016  PHART 7.406 7.473*  PCO2ART 31.0* 24.8*  PO2ART 66.0* 110*    Liver Enzymes  Recent Labs Lab 11/19/15 1027 11/20/15 0258  AST 128* 189*  ALT 161* 238*  ALKPHOS 210* 225*  BILITOT 0.5 0.7  ALBUMIN 3.1* 3.0*    Cardiac Enzymes  Recent Labs Lab 11/20/15 1122 11/20/15 1947 11/20/15 2208  TROPONINI 0.09* 0.07* 0.06*    Glucose  Recent Labs Lab 11/21/15 1140 11/21/15 1551 11/21/15 1931 11/21/15 2329 11/22/15 0322 11/22/15 0759  GLUCAP 114* 104* 96 93 96 94    Imaging Ct Head Wo Contrast  11/22/2015  ADDENDUM REPORT: 11/22/2015 12:12 ADDENDUM: Study discussed by telephone with Dr. Ritta Slot on 11/22/2015 at 1208 hours. He advises that the patient did briefly have an EVD placement between today study an the exam on 11/19/2015 - the EVD was replaced but the patient then pulled it out. Therefore I feel that is the most likely explanation for the interval increased intracranial gas. CONCLUSION: Interval increased intracranial gas related to interval EVD placement and removal. Otherwise stable CT appearance of the brain since 11/19/2015. Electronically Signed   By: Odessa Fleming M.D.   On: 11/22/2015 12:12  11/22/2015  CLINICAL DATA:  70 year old male with intracranial hemorrhage, bacterial meningitis.  Initial encounter. EXAM: CT HEAD WITHOUT CONTRAST TECHNIQUE: Contiguous axial images were obtained from the base of the skull through the vertex without intravenous contrast. COMPARISON:  11/19/2015 and earlier. FINDINGS: Study is mild degraded by motion artifact . Right superior approach external ventricular drain has been removed since the study on 11/15/2015. However, a tear has been increased pneumo ventricle since that time, now moderate in the right lateral ventricle. Overall ventricle size and configuration otherwise has not significantly changed. Intraventricular extension of the left periatrial and temporal lobe hemorrhage has regressed since 11/15/2015. There is also a small volume of pneumocephalus now along the anterior right frontal lobe. No subdural or extradural blood identified. No new intracranial hemorrhage identified. Confluent hypodensity in the posterior left hemisphere in temporal lobe in a pattern most compatible with vasogenic edema has not significantly changed. Stable gray-white matter differentiation. No new cortically based infarct identified. No acute osseous abnormality identified. Stable trace ethmoid sinus mucosal thickening. Other Visualized paranasal sinuses and mastoids are clear. No acute orbit or scalp soft  tissue finding identified. IMPRESSION: 1. Abnormal increasing pneumoventricle and now trace pneumocephalus since the 11/15/2015 exam on which the external ventricular drain had already been removed. The appearance is suspicious for progressive gas-forming infection. 2. Otherwise stable appearance of left hemisphere edema and intra-axial hemorrhage with intraventricular extension. No acute ventriculomegaly. Electronically Signed: By: Odessa Fleming M.D. On: 11/22/2015 12:02   Dg Chest Port 1 View  11/22/2015  CLINICAL DATA:  Acute respiratory failure, meningitis, central line placement. EXAM: PORTABLE CHEST 1 VIEW COMPARISON:  Portable chest x-ray of November 22, 2015 at 1:12 a.m.  FINDINGS: The lungs remain mildly hypoinflated. There is no pneumothorax or pleural effusion. There is no alveolar infiltrate. There are mildly increased lung markings at the left lung base. The heart is top-normal in size. The pulmonary vascularity is normal. There are post median sternotomy changes with evidence of previous mitral and tricuspid valve replacement. The left internal jugular venous catheter tip projects over the proximal SVC. IMPRESSION: Persistent hypo inflation with minimal subsegmental atelectasis at the left lung base. There is no evidence of pulmonary edema or alveolar pneumonia. Electronically Signed   By: David  [place] M.D.   On: 11/22/2015 07:41   Dg Chest Port 1 View  11/22/2015  CLINICAL DATA:  Central line placement EXAM: PORTABLE CHEST 1 VIEW COMPARISON:  Yesterday FINDINGS: New left IJ central line with tip at the SVC origin. No pneumothorax. Low lung volumes with interstitial crowding/atelectasis. No effusion or edema. Chronic cardiomegaly. The patient is status post CABG and mitral/tricuspid valve replacement. IMPRESSION: 1. No adverse finding after central line placement. 2. Low volumes with basilar atelectasis. Electronically Signed   By: Marnee Spring M.D.   On: 11/22/2015 01:46     STUDIES:  CT head 1/29>>>  Left parietal intraparenchymal hemorrhage not significantly changed.  Stable surrounding edema. Intraventricular extension again noted.  The ventricles are more prominent on today's study suggesting developing hydrocephalus. Slight left-to-right midline shift, 3 mm.  CULTURES: BC x 2 1/29>>> Urine 1/29>>> CSF 1/30 >>>  ANTIBIOTICS: Vanc 1/29>> Cefepime 1/29>> Ampicilln  1/30>> 2/1  SIGNIFICANT EVENTS:   LINES/TUBES:   DISCUSSION: 70yo male with multiple medical problems and recent ICH in setting coumadin.  Admitted with fevers, tachypnea, mild hypoxemia and decline in mental status.  CSF c/w acute bacterial meningitis.  ASSESSMENT /  PLAN:  PULMONARY Hypoxemia  Tachypnea -Cheyne-Stokes respirations  Recent LLL PNA P:   Supplemental O2  Empiric abx as above   CARDIOVASCULAR HTN AFib  CAD - s/p CABG  P:  No anticoagulation PRN labetalol to keep SBP <170 Trend BNP  Hold home lasix for now with AKI  Amio gtt for afib. Lopressor PRN for afib  RENAL AKI on CKD III - baseline Scr ~1.7 P:   F/u chem  D5W for hypernatremia /hr.  GASTROINTESTINAL Abnormal LFT's  P:   Trend LFT's  Place cortrack and start feeds  HEMATOLOGIC Leukocytosis  P:  F/u CBC  SCD's   INFECTIOUS Fever, leukocytosis Meningitis  P:   Empiric abx as above  Vanco, cefipime,  D/C ampicillin  ENDOCRINE Hyperglycemia   P:   SSI   NEUROLOGIC AMS -- concern for further neurologic event, meningitis given fever, tachypnea, decline in mental status with no obvious source of infection. ? Bacteremia  ICH - recent.  In setting coumadin.  CT head with no acute change. No seizures on CT P:   Hold sedating medications  Follow up repeat CT of head.  FAMILY  Update: I updated  the wife at bedside today 2/1. There reiterated that his living will stated that he did not want resuscitation or intubation. We agree to place a feeding tube for now provide nutrition support and continue antibiotic treatment. We'll reinstate his DNR/DNI code status and reassess later this week. If he is not making any improvements they may consider just keeping him comfortable.   Critical care- 45 mins  Chilton Greathouse MD [facility] Pager [phone] If no answer or after 3pm call: [phone] 11/22/2015, 12:25 PM

## 2015-11-22 NOTE — Procedures (Signed)
Central Venous Catheter Insertion Procedure Note Vannie Hochstetler [facility] [id number] 06/02/1946  Procedure: Insertion of Central Venous Catheter Indications: Assessment of intravascular volume, Drug and/or fluid administration and Frequent blood sampling  Procedure Details Consent: Risks of procedure as well as the alternatives and risks of each were explained to the (patient/caregiver).  Consent for procedure obtained. Time Out: Verified patient identification, verified procedure, site/side was marked, verified correct patient position, special equipment/implants available, medications/allergies/relevent history reviewed, required imaging and test results available.  Performed  Maximum sterile technique was used including antiseptics, cap, gloves, gown, hand hygiene, mask and sheet. Skin prep: Chlorhexidine; local anesthetic administered A antimicrobial bonded/coated triple lumen catheter was placed in the left internal jugular vein using the Seldinger technique.  Evaluation Blood flow good Complications: No apparent complications Patient did tolerate procedure well. Chest X-ray ordered to verify placement.  CXR: pending.  Procedure performed under direct ultrasound guidance for real time vessel cannulation.      Rutherford Guys, [place] - C [place] [facility] Pager: [phone]  or [phone] 11/22/2015, 12:59 AM

## 2015-11-22 NOTE — Progress Notes (Signed)
Nutrition Follow-up  INTERVENTION:   Initiate Vital AF 1.2 @ 25 ml/hr via Cortrak tube and increase by 10 ml every 4 hours to goal rate of 65 ml/hr.   Tube feeding regimen provides 1872 kcal (99% of needs), 117 grams of protein, and 1265 ml of H2O.   NUTRITION DIAGNOSIS:   Inadequate oral intake related to inability to eat as evidenced by NPO status. Ongoing.   GOAL:   Patient will meet greater than or equal to 90% of their needs Not met.   MONITOR:   TF tolerance, Diet advancement, Labs  REASON FOR ASSESSMENT:   Consult Enteral/tube feeding initiation and management  ASSESSMENT:   Unfortunate 70 year old male with history of atrial fibrillation on Coumadin. 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 He was left with some residual aphasia and right-sided weakness on discharge to skilled nursing facility on 1/27. Patient presents to hospital today with a decline in his mental status, tachypnea, and febrile. In the emergency department he was found to have what appeared to be Cheyne-Stokes breathing. He intermittently did open his eyes and resist some elements of exam but did not attempt to respond to questions or follow commands.  Per CCM plan for antibiotics to treat bacterial meningitis and provide nutrition support via Cortrak to allow pt time to improve.  Labs reviewed: sodium elevated 148 on IVF's, potassium, magnesium and phosphorus are WNL Cortrak tube in place: xray pending.  Pt discussed during ICU rounds and with RN.    Diet Order:  Diet NPO time specified  Skin:  Reviewed, no issues  Last BM:  unknown  Height:   Ht Readings from Last 1 Encounters:  11/19/15 _0  (1.753 m)   Weight:   Wt Readings from Last 1 Encounters:  11/22/15 190 lb 11.2 oz (86.5 kg)   Ideal Body Weight:  72.7  kg  BMI:  Body mass index is 28.15 kg/(m^2).  Estimated Nutritional Needs:   Kcal:  1900-2100  Protein:  100-115 grams  Fluid:  >1.9 L/day  EDUCATION NEEDS:   No education needs identified at this time  [place], [place], [place] Pager [phone] After Hours Pager

## 2015-11-22 NOTE — Progress Notes (Signed)
eLink Physician-Brief Progress Note Patient Name: Jordan [name] DOB: 02-09-1946 MRN: [id number]   Date of Service  11/22/2015  HPI/Events of Note  desatn during sleep  eICU Interventions  Prob OSA, bipap prn & during sleep     Intervention Category Intermediate Interventions: Other:  ALVA,RAKESH V. 11/22/2015, 6:24 PM

## 2015-11-22 NOTE — Progress Notes (Signed)
[facility]  Day #4 abtx: vanco, cefepime  ID: 70 yo M with  Hx of CAD s/p cabg, presents with worsening mental status/fever/meningismus following discharge after hospitalization with hydrocephalus requiring EVD. His CT was suggestive of hydrocephalus and given his clinical status, an EVD was placed under low pressure, but CSF was sampled showing a large pleocytosis(despite accounting for RBC) that is neutrophilic predominant. Given fever and  neutrophilic predominant CSF, and meningismus, there is concern for bacterial meningitis. CSF cx may remain sterile due to abtx starting roughly 18hr before LP.  Reason for visit: Follow up on meningitis  Interval History: afebrile, no positive cultures. Leukocytosis improving, remains encephalopathic  Physical Exam: Constitutional:  Filed Vitals:   11/22/15 0800 11/22/15 0900  BP: 116/85 107/81  Pulse:    Temp: 97.2 F (36.2 C)   Resp: 30 39   patient appears in NAD; does not respond to commands, opens eyes to verbal stimuli Respiratory: Normal respiratory effort; CTA B Cardiovascular: RRR  Review of Systems: Unable to be assessed due to mental status  Lab Results  Component Value Date   WBC 11.3* 11/22/2015   HGB 10.4* 11/22/2015   HCT 33.9* 11/22/2015   MCV 85.6 11/22/2015   PLT 497* 11/22/2015    Lab Results  Component Value Date   CREATININE 1.80* 11/22/2015   BUN 37* 11/22/2015   NA 148* 11/22/2015   K 3.5 11/22/2015   CL 113* 11/22/2015   CO2 20* 11/22/2015    Lab Results  Component Value Date   ALT 238* 11/20/2015   AST 189* 11/20/2015   ALKPHOS 225* 11/20/2015     Microbiology: Recent Results (from the past 240 hour(s))  Blood Culture (routine x 2)     Status: None (Preliminary result)   Collection Time: 11/19/15 10:27 AM  Result Value Ref Range Status   Specimen Description BLOOD LEFT ANTECUBITAL  Final   Special Requests BOTTLES DRAWN AEROBIC AND ANAEROBIC 5CC  Final   Culture NO  GROWTH 2 DAYS  Final   Report Status PENDING  Incomplete  Urine culture     Status: None   Collection Time: 11/19/15 10:31 AM  Result Value Ref Range Status   Specimen Description URINE, CATHETERIZED  Final   Special Requests NONE  Final   Culture NO GROWTH 1 DAY  Final   Report Status 11/20/2015 FINAL  Final  Blood Culture (routine x 2)     Status: None (Preliminary result)   Collection Time: 11/19/15 10:35 AM  Result Value Ref Range Status   Specimen Description BLOOD RIGHT ANTECUBITAL  Final   Special Requests BOTTLES DRAWN AEROBIC AND ANAEROBIC 5CC  Final   Culture NO GROWTH 2 DAYS  Final   Report Status PENDING  Incomplete  Fungus Culture with Smear     Status: None (Preliminary result)   Collection Time: 11/20/15 12:14 PM  Result Value Ref Range Status   Specimen Description CSF  Final   Special Requests NONE  Final   Fungal Smear   Final    NO YEAST OR FUNGAL ELEMENTS SEEN Performed at [facility]    Culture   Final    CULTURE IN PROGRESS FOR FOUR WEEKS Performed at [facility]    Report Status PENDING  Incomplete  CSF culture     Status: None (Preliminary result)   Collection Time: 11/20/15 12:14 PM  Result Value Ref Range Status   Specimen Description CSF  Final   Special  Requests NONE  Final   Gram Stain   Final    FEW WBC PRESENT,BOTH PMN AND MONONUCLEAR NO ORGANISMS SEEN    Culture NO GROWTH 2 DAYS  Final   Report Status PENDING  Incomplete    Impression/Plan:  1. Meningitis - no organisms to date and was on antibiotics prior.  Continue with vancomycin and cefepime.  Will stop ampicillin with low likelihood of listeria.   If no improvement in cell count will consider antifungal.    2. Encephalopathy - multifactorial. Continue treatment for #1.   3. CKD - will continue to monitor especially since is on vancomycin concern for vanco toxicity  4. transaminitis - possibly related to sepsis. No prolonged periods of hypotension. Recommend  repeat cmp tomorrow to track trend

## 2015-11-22 NOTE — Clinical Social Work Note (Signed)
Clinical Social Work Assessment  Patient Details  Name: Jordan [name] MRN: [id number] Date of Birth: [birth year]  Date of referral:  11/22/15               Reason for consult:  Facility Placement, Discharge Planning                Permission sought to share information with:  Facility Medical sales representative, Family Supports Permission granted to share information::  No  Name::        Agency::     Relationship::     Contact Information:     Housing/Transportation Living arrangements for the past 2 months:  Single Family Home Source of Information:  Spouse Patient Interpreter Needed:  None Criminal Activity/Legal Involvement Pertinent to Current Situation/Hospitalization:  No - Comment as needed Significant Relationships:  Spouse, Adult Children Lives with:  Spouse Do you feel safe going back to the place where you live?  Yes Need for family participation in patient care:  Yes (Comment)  Care giving concerns:  Patient's family state they are concerned about the patient's current condition. The wife questions whether the patient will "make it."   Office manager / plan:  CSW spoke with the patient's wife by phone to complete assessment. Per wife the patient was readmitted to the hospital from [facility]. She reports that she was happy with the care the patient received while at [facility] and would like the patient to go back to this facility if discharged to a SNF. The wife states she is not sure the patient will "make it" and asks that CSW speak with MD about the patient's condition. CSW notes that patient's condition will continue to be monitored for a couple of days before making any decisions regarding goals of care. CSW will be available to provide support and assistance as needed.   Employment status:  Retired Database administrator PT Recommendations:  Not assessed at this time Information / Referral to community resources:  Skilled Nursing  Facility  Patient/Family's Response to care:  Patient's wife is happy with the care the patient is receiving and has received.   Patient/Family's Understanding of and Emotional Response to Diagnosis, Current Treatment, and Prognosis:  The patient's wife appears to have good insight into the reason for the patient's admission and the patient's post DC needs.   Emotional Assessment Appearance:  Appears stated age Attitude/Demeanor/Rapport:  Unable to Assess Affect (typically observed):  Unable to Assess Orientation:    Alcohol / Substance use:  Never Used Psych involvement (Current and /or in the community):  No (Comment)  Discharge Needs  Concerns to be addressed:  Discharge Planning Concerns Readmission within the last 30 days:  Yes Current discharge risk:  Chronically ill, Cognitively Impaired, Physical Impairment Barriers to Discharge:  Continued Medical Work up   Venita Lick, LCSW 11/22/2015, 2:45 PM

## 2015-11-22 NOTE — Progress Notes (Signed)
[facility] notified of periods of apnea which result in a drop in O2 saturation. Pt. Placed on venturi mask at 40%. No new orders given at this time. Will continue to monitor closely.

## 2015-11-22 NOTE — Progress Notes (Signed)
Subjective: No significant changes, occasional command follow yesterday reported.   Exam: Filed Vitals:   11/22/15 0700 11/22/15 0800  BP: 127/92   Pulse:    Temp:  97.2 F (36.2 C)  Resp: 37    Gen: In bed, shivering Resp: non-labored breathing, no acute distress Abd: soft, nt  Neuro: MS: opens eyes to voice. Does not clearly follow commands, but does move all extremities purposefully.  CN: PERRL, fixates and tracks, eyes dysconjugate Motor: he moves all extremities spontaneously, markedly tremulous.  Sensory: responds to nox stim x 4.    Impression: 70 yo M with worsening mental status/fever/meningismus following discharge after hospitalization with hydrocephalus requiring EVD. His CT was suggestive of hydrocephalus and given his clinical status, an EVD was placed again yesterday. It was under low pressure, but CSF was sampled showing a very large pleocytosis(far outside what could be accounted for by RBC) that is neutrophilic predominant.   Given the clinical picture of fever, neutrophilic predominant CSF, and meningismus. I suspect that this represents a bacterial meningitis. He did receive cefepime about 18 hours prior to teh CSF sample and therefore I would not be surprised that it would be sterilized. He has been started on antibiotics.    Recommendations: 1) Repeat heada CT 2) continue antibiotics.  3) Will continue to follow.   Ritta Slot, MD [facility] [phone]  If 7pm- 7am, please page neurology on call as listed in AMION.

## 2015-11-23 ENCOUNTER — Inpatient Hospital Stay (HOSPITAL_COMMUNITY): Payer: PPO

## 2015-11-23 LAB — CBC
HCT: 32.1 % — ABNORMAL LOW (ref 39.0–52.0)
HEMOGLOBIN: 10 g/dL — AB (ref 13.0–17.0)
MCH: 26.5 pg (ref 26.0–34.0)
MCHC: 31.2 g/dL (ref 30.0–36.0)
MCV: 84.9 fL (ref 78.0–100.0)
Platelets: 426 10*3/uL — ABNORMAL HIGH (ref 150–400)
RBC: 3.78 MIL/uL — ABNORMAL LOW (ref 4.22–5.81)
RDW: 15.7 % — ABNORMAL HIGH (ref 11.5–15.5)
WBC: 10.6 10*3/uL — ABNORMAL HIGH (ref 4.0–10.5)

## 2015-11-23 LAB — GLUCOSE, CAPILLARY
GLUCOSE-CAPILLARY: 106 mg/dL — AB (ref 65–99)
GLUCOSE-CAPILLARY: 97 mg/dL (ref 65–99)
GLUCOSE-CAPILLARY: 116 mg/dL — AB (ref 65–99)
GLUCOSE-CAPILLARY: 100 mg/dL — AB (ref 65–99)
Glucose-Capillary: 112 mg/dL — ABNORMAL HIGH (ref 65–99)
Glucose-Capillary: 126 mg/dL — ABNORMAL HIGH (ref 65–99)

## 2015-11-23 LAB — BASIC METABOLIC PANEL
ANION GAP: 12 (ref 5–15)
BUN: 36 mg/dL — ABNORMAL HIGH (ref 6–20)
CHLORIDE: 113 mmol/L — AB (ref 101–111)
CO2: 21 mmol/L — AB (ref 22–32)
Calcium: 8.5 mg/dL — ABNORMAL LOW (ref 8.9–10.3)
Creatinine, Ser: 1.72 mg/dL — ABNORMAL HIGH (ref 0.61–1.24)
GFR calc Af Amer: 44 mL/min — ABNORMAL LOW (ref >60)
GFR calc non Af Amer: 38 mL/min — ABNORMAL LOW (ref >60)
GLUCOSE: 129 mg/dL — AB (ref 65–99)
POTASSIUM: 3.2 mmol/L — AB (ref 3.5–5.1)
Sodium: 146 mmol/L — ABNORMAL HIGH (ref 135–145)

## 2015-11-23 LAB — VANCOMYCIN, TROUGH: VANCOMYCIN TR: 17 ug/mL (ref 10.0–20.0)

## 2015-11-23 LAB — CSF CULTURE W GRAM STAIN: Culture: NO GROWTH

## 2015-11-23 LAB — CSF CULTURE

## 2015-11-23 LAB — PHOSPHORUS: Phosphorus: 3.4 mg/dL (ref 2.5–4.6)

## 2015-11-23 LAB — MAGNESIUM: Magnesium: 2.3 mg/dL (ref 1.7–2.4)

## 2015-11-23 MED ORDER — FENTANYL CITRATE (PF) 100 MCG/2ML IJ SOLN
25.0000 ug | INTRAMUSCULAR | Status: DC | PRN
  Administered 2015-11-23 – 2015-11-24 (×5): 25 ug via INTRAVENOUS
  Filled 2015-11-23 (×5): qty 2

## 2015-11-23 MED ORDER — POTASSIUM CHLORIDE 10 MEQ/100ML IV SOLN
10.0000 meq | INTRAVENOUS | Status: AC
  Administered 2015-11-23 (×4): 10 meq via INTRAVENOUS
  Filled 2015-11-23 (×4): qty 100

## 2015-11-23 NOTE — Progress Notes (Signed)
PULMONARY / CRITICAL CARE MEDICINE   Name: Jordan [name] MRN: [id number] DOB: 1945/12/04    ADMISSION DATE:  11/19/2015  REFERRING MD:  Francene Castle Rad[place] Pax)   CHIEF COMPLAINT:  Respiratory failure   HISTORY OF PRESENT ILLNESS:   70yo male with hx HTN, OSA, bipolar disorder, CKD III, AFib on coumadin, CAD with recent NSTEMI 12/16 with subsequent CABG at that time.  Just d/c 1/27 after admission for large ICH with intraventricular extension.  He had worsening neurologic status requiring intubation and ventriculostomy. Course was c/b aspiration PNA and he was d/c on Cipro.   He improved slowly and was d/c to SNF with residual aphasia and R hemiparesis.  On 1/29 he was brought to ER with fever 102, tachypnea and decline in mental status.  No reports of cough, hemoptysis, vomiting or diarrhea.  In ER had had persistent hypoxia and tachypnea and PCCM called to admit to ICU.   PAST MEDICAL HISTORY :  He  has a past medical history of Hypertension; Kidney stone; Bipolar 1 disorder (HCC); Diverticulitis; Anxiety; Arthritis; Sleep apnea; Chronic headaches; Stage III chronic kidney disease; Left inguinal hernia; Atherosclerosis; CAD (coronary artery disease); Hiatal hernia; Esophagitis versus Mallory-Weiss tear; and Antritis (stomach).  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  No Known Allergies  No current facility-administered medications on file prior to encounter.   Current Outpatient Prescriptions on File Prior to Encounter  Medication Sig  . amiodarone (PACERONE) 200 MG tablet Take 1 tablet  (200 mg total) by mouth daily.  Marland Kitchen atorvastatin (LIPITOR) 10 MG tablet Take 1 tablet (10 mg total) by mouth daily at 6 PM.  . ferrous sulfate 325 (65 FE) MG tablet Take 1 tablet (325 mg total) by mouth daily with breakfast. For one month then stop.  . furosemide (LASIX) 40 MG tablet Take 1 tablet (40 mg total) by mouth daily.  Marland Kitchen senna-docusate (SENOKOT-S) 8.6-50 MG tablet Take 1 tablet by mouth 2 (two) times daily.  . ciprofloxacin (CIPRO) 500 MG tablet Take 1 tablet (500 mg total) by mouth 2 (two) times daily.  . [DISCONTINUED] buPROPion (WELLBUTRIN SR) 150 MG 12 hr tablet Take 450 mg by mouth daily.    FAMILY HISTORY:  His indicated that his mother is alive. He indicated that his father is deceased. He indicated that all of his three sisters are alive. He indicated that his brother is deceased.   SOCIAL HISTORY: He  reports that he quit smoking about 32 years ago. He has never used smokeless tobacco. He reports that he does not drink alcohol or use illicit drugs.  REVIEW OF SYSTEMS:   Unable - as per HPI obtained from records.   SUBJECTIVE:  No change in mental status.  VITAL SIGNS: BP 106/65 mmHg  Pulse 94  Temp(Src) 99.2 F (37.3 C) (Axillary)  Resp 30  Ht  (1.753 m)  Wt 201 lb 1 oz (91.2 kg)  BMI 29.68 kg/m2  SpO2 100%  HEMODYNAMICS:    VENTILATOR SETTINGS: Vent Mode:  [-]  FiO2 (%):  [40 %] 40 %  INTAKE / OUTPUT: I/O last 3 completed shifts: In: 5373.7 [I.V.:4201.2; NG/GT:522.5; IV Piggyback:650] Out: 1675 [Urine:1675]  PHYSICAL EXAMINATION: General:  Opens eyes to commands. Does not respond  to questions. Neuro:  Somnolent, aphasic, minimally interactive, PERRL HEENT:  Moist mucus membranes, No thyromegaly, JVD. Cardiovascular:  S1, S2, RRR Lungs:  Clear, no crackles, wheeze. Has intermittent periods of rapid breathing.  Abdomen:  Soft, + BS Musculoskeletal:  Warm and dry, Noedema   LABS:  BMET  Recent Labs Lab 11/21/15 0645 11/22/15 0550  11/23/15 0545  NA 149* 148* 146*  K 3.9 3.5 3.2*  CL 116* 113* 113*  CO2 19* 20* 21*  BUN 43* 37* 36*  CREATININE 1.89* 1.80* 1.72*  GLUCOSE 114* 115* 129*    Electrolytes  Recent Labs Lab 11/21/15 0645 11/22/15 0550 11/23/15 0545  CALCIUM 9.1 8.8* 8.5*  MG 2.6* 2.4 2.3  PHOS 3.8 3.9 3.4    CBC  Recent Labs Lab 11/21/15 0645 11/22/15 0550 11/23/15 0545  WBC 15.3* 11.3* 10.6*  HGB 11.7* 10.4* 10.0*  HCT 37.3* 33.9* 32.1*  PLT 524* 497* 426*    Coag's No results for input(s): APTT, INR in the last 168 hours.  Sepsis Markers  Recent Labs Lab 11/19/15 1027 11/19/15 1040 11/21/15 0345  LATICACIDVEN  --  1.47 1.7  PROCALCITON 0.27  --   --     ABG  Recent Labs Lab 11/19/15 1103 11/20/15 1016  PHART 7.406 7.473*  PCO2ART 31.0* 24.8*  PO2ART 66.0* 110*    Liver Enzymes  Recent Labs Lab 11/19/15 1027 11/20/15 0258  AST 128* 189*  ALT 161* 238*  ALKPHOS 210* 225*  BILITOT 0.5 0.7  ALBUMIN 3.1* 3.0*    Cardiac Enzymes  Recent Labs Lab 11/20/15 1122 11/20/15 1947 11/20/15 2208  TROPONINI 0.09* 0.07* 0.06*    Glucose  Recent Labs Lab 11/22/15 1324 11/22/15 1547 11/22/15 1936 11/22/15 2318 11/23/15 0319 11/23/15 0751  GLUCAP 109* 99 93 106* 97 116*    Imaging Dg Chest Port 1 View  11/23/2015  CLINICAL DATA:  70 year old male with acute respiratory failure. Subsequent encounter. EXAM: PORTABLE CHEST 1 VIEW COMPARISON:  11/22/2015. FINDINGS: Post CABG and valve replacement. Heart size within normal limits. Tortuous aorta. Right central line tip proximal superior vena cava level without pneumothorax. Central pulmonary vascular prominence without segmental consolidation. Poor inspiration with elevated hemidiaphragms. Post cervical spine surgery. IMPRESSION: No significant change. Poor inspiratory exam with mild central pulmonary vascular prominence. Postoperative changes as noted above. Interval placement of feeding tube which courses  below diaphragm. Tip not imaged on the current exam. Electronically Signed   By: Lacy Duverney M.D.   On: 11/23/2015 07:51   Dg Abd Portable 1v  11/22/2015  CLINICAL DATA:  Encounter for feeding tube placement EXAM: PORTABLE ABDOMEN - 1 VIEW COMPARISON:  11/06/2015 FINDINGS: The tip of the feeding tube is in the expected location of the ligament of Treitz. Normal bowel gas pattern. IMPRESSION: 1. Feeding tube tip is at the level of the ligament of Treitz. Electronically Signed   By: Signa Kell M.D.   On: 11/22/2015 16:57     STUDIES:  CT head 1/29>>>  Left parietal intraparenchymal hemorrhage not significantly changed.  Stable surrounding edema. Intraventricular extension again noted.  The ventricles are more prominent on today's study suggesting developing hydrocephalus. Slight left-to-right midline shift, 3 mm.  CULTURES: BC x 2 1/29>>> Urine 1/29>>> CSF 1/30 >>>  ANTIBIOTICS: Vanc 1/29>> Cefepime 1/29>> Ampicilln  1/30>> 2/1  SIGNIFICANT EVENTS:   LINES/TUBES:   DISCUSSION: 70yo male with multiple medical problems and recent ICH in setting coumadin.  Admitted with fevers, tachypnea, mild hypoxemia and decline in mental  status.  CSF c/w acute bacterial meningitis.  ASSESSMENT / PLAN:  PULMONARY Hypoxemia  Tachypnea -Cheyne-Stokes respirations  Recent LLL PNA P:   Supplemental O2  Empiric abx as above   CARDIOVASCULAR HTN AFib  CAD - s/p CABG  P:  No anticoagulation PRN labetalol to keep SBP <170 Trend BNP  Restart lasix. 40 mg IV once.  Amio gtt for afib. Lopressor PRN for afib  RENAL AKI on CKD III - baseline Scr ~1.7 P:   F/u chem  D5W for hypernatremia @100cc /hr.  GASTROINTESTINAL Abnormal LFT's  P:   Trend LFT's  Continue feeds  HEMATOLOGIC Leukocytosis  P:  F/u CBC  SCD's   INFECTIOUS Fever, leukocytosis Meningitis  P:   Empiric abx as above  Vanco, cefipime,  Off ampicillin  ENDOCRINE Hyperglycemia   P:   SSI    NEUROLOGIC AMS -- meningitis ICH - recent.  In setting coumadin.  CT head with no acute change. No seizures on EEG. P:   Holding sedating medications   FAMILY  Update: I updated the wife at bedside today 2/2. She reiterated that his living will stated that he did not want resuscitation or intubation. We agree to continue the feeding tube for now provide nutrition support and continue antibiotic treatment. He is still DNR/DNI. We will reassess tomorrow. If he is not making any improvements they may consider just keeping him comfortable.   Critical care- 45 mins  Chilton Greathouse MD [facility] Pager [phone] If no answer or after 3pm call: [phone] 11/23/2015, 12:48 PM

## 2015-11-23 NOTE — Progress Notes (Signed)
eLink Pharmacist-Brief Progress Note Patient Name: Jordan [name] DOB: May 25, 1946 MRN: [id number]   Date of Service  11/23/2015  HPI/Events of Note  Electrolytes evaluated. K 3.2.  eICU Interventions  KCl IV 10 mEq x4 ordered   eLink Provider: Betha Loa, PharmD 11/23/2015, 4:02 PM

## 2015-11-23 NOTE — Progress Notes (Signed)
Subjective: No significant changes  Exam: Filed Vitals:   11/23/15 1100 11/23/15 1245  BP: 106/65   Pulse: 94   Temp:  99.2 F (37.3 C)  Resp: 30    Gen: In bed, shivering Resp: non-labored breathing, no acute distress Abd: soft, nt  Neuro: MS: opens eyes to voice. Does not clearly follow commands, but does move all extremities purposefully.  CN: PERRL, fixates and tracks, eyes dysconjugate Motor: he moves all extremities spontaneously, markedly tremulous.  Sensory: responds to nox stim x 4.    Impression: 70 yo M with worsening mental status/fever/meningismus following discharge after hospitalization with hydrocephalus requiring EVD. His CT was suggestive of hydrocephalus and given his clinical status, an EVD was placed again yesterday. It was under low pressure, but CSF was sampled showing a very large pleocytosis(far outside what could be accounted for by RBC) that is neutrophilic predominant.   Given the clinical picture of fever, neutrophilic predominant CSF, and meningismus. I suspect that this represents a bacterial meningitis. He did receive cefepime about 18 hours prior to the CSF sample and therefore I would not be surprised that it would be sterilized. He has been started on antibiotics.   Pneumocephalus on repeat head CT likely related to EVD  Recommendations: 1) continue antibiotics.  3) Will continue to follow.   Ritta Slot, MD [facility] [phone]  If 7pm- 7am, please page neurology on call as listed in AMION.

## 2015-11-23 NOTE — Progress Notes (Signed)
Pharmacy Antibiotic Follow-up Note  Jordan [name] is a 70 y.o. year-old male admitted on 11/19/2015.  The patient is currently on day 5 of cefepime and vancomycin for r/o meningitis. Pt is afebrile and WBC is 10.6. Scr with a slight trend today to 1.72. A vancomycin trough was checked today and is therapeutic at 17.    Assessment/Plan: -Continue Vancomycin 1 gm IV Q 24 hours -Continue Cefepime 2 gm IV to q12 until meningitis r/o -F/u renal fxn, C&S, clinical status and trough at SS  Temp (24hrs), Avg:98.6 F (37 C), Min:97.4 F (36.3 C), Max:99.5 F (37.5 C)   Recent Labs Lab 11/19/15 1027 11/20/15 0258 11/21/15 0645 11/22/15 0550 11/23/15 0545  WBC 23.9* 19.9* 15.3* 11.3* 10.6*     Recent Labs Lab 11/19/15 1027 11/20/15 0258 11/21/15 0645 11/22/15 0550 11/23/15 0545  CREATININE 2.85* 2.23* 1.89* 1.80* 1.72*   Estimated Creatinine Clearance: 44 mL/min (by C-G formula based on Cr of 1.72).    No Known Allergies  Antimicrobials this admission: Cefepime 1/29>> Vancomycin 1/29>> Ampicillin 1/30>>2/1  Levels/dose changes this admission: 2/2 VT = 17 on 1gm Q24H  Microbiology results: 1/29 Blood x 2 - NGTD 1/29 Urine - NEG 1/30 CSF - NGTD  Thank you for allowing pharmacy to be a part of this patient's care.  Lysle Pearl, PharmD, BCPS Pager # [phone] 11/23/2015 11:38 AM

## 2015-11-23 NOTE — Progress Notes (Signed)
Patient ID: Jordan [name], male   DOB: [birth year], 70 y.o.   MRN: [id number] Neuro unchanged.

## 2015-11-24 ENCOUNTER — Inpatient Hospital Stay (HOSPITAL_COMMUNITY): Payer: PPO

## 2015-11-24 LAB — CULTURE, BLOOD (ROUTINE X 2)
CULTURE: NO GROWTH
CULTURE: NO GROWTH

## 2015-11-24 LAB — CBC
HEMATOCRIT: 30.6 % — AB (ref 39.0–52.0)
Hemoglobin: 9.6 g/dL — ABNORMAL LOW (ref 13.0–17.0)
MCH: 26.3 pg (ref 26.0–34.0)
MCHC: 31.4 g/dL (ref 30.0–36.0)
MCV: 83.8 fL (ref 78.0–100.0)
Platelets: 438 10*3/uL — ABNORMAL HIGH (ref 150–400)
RBC: 3.65 MIL/uL — ABNORMAL LOW (ref 4.22–5.81)
RDW: 15.5 % (ref 11.5–15.5)
WBC: 12.8 10*3/uL — ABNORMAL HIGH (ref 4.0–10.5)

## 2015-11-24 LAB — BASIC METABOLIC PANEL
ANION GAP: 12 (ref 5–15)
BUN: 35 mg/dL — ABNORMAL HIGH (ref 6–20)
CALCIUM: 8.2 mg/dL — AB (ref 8.9–10.3)
CO2: 19 mmol/L — AB (ref 22–32)
CREATININE: 1.55 mg/dL — AB (ref 0.61–1.24)
Chloride: 111 mmol/L (ref 101–111)
GFR calc Af Amer: 50 mL/min — ABNORMAL LOW (ref >60)
GFR calc non Af Amer: 43 mL/min — ABNORMAL LOW (ref >60)
GLUCOSE: 129 mg/dL — AB (ref 65–99)
Potassium: 3.5 mmol/L (ref 3.5–5.1)
Sodium: 142 mmol/L (ref 135–145)

## 2015-11-24 LAB — GLUCOSE, CAPILLARY
GLUCOSE-CAPILLARY: 115 mg/dL — AB (ref 65–99)
GLUCOSE-CAPILLARY: 97 mg/dL (ref 65–99)
GLUCOSE-CAPILLARY: 107 mg/dL — AB (ref 65–99)
Glucose-Capillary: 105 mg/dL — ABNORMAL HIGH (ref 65–99)
Glucose-Capillary: 121 mg/dL — ABNORMAL HIGH (ref 65–99)

## 2015-11-24 LAB — HEPATIC FUNCTION PANEL
ALK PHOS: 194 U/L — AB (ref 38–126)
ALT: 224 U/L — ABNORMAL HIGH (ref 17–63)
AST: 74 U/L — ABNORMAL HIGH (ref 15–41)
Albumin: 2.5 g/dL — ABNORMAL LOW (ref 3.5–5.0)
Bilirubin, Direct: <0.1 mg/dL — ABNORMAL LOW (ref 0.1–0.5)
TOTAL PROTEIN: 5.9 g/dL — AB (ref 6.5–8.1)
Total Bilirubin: 0.1 mg/dL — ABNORMAL LOW (ref 0.3–1.2)

## 2015-11-24 LAB — PHOSPHORUS: Phosphorus: 2.6 mg/dL (ref 2.5–4.6)

## 2015-11-24 LAB — MAGNESIUM: Magnesium: 2.1 mg/dL (ref 1.7–2.4)

## 2015-11-24 MED ORDER — HALOPERIDOL LACTATE 5 MG/ML IJ SOLN: 5.0000 mg | Freq: Four times a day (QID) | INTRAMUSCULAR | Status: DC | PRN

## 2015-11-24 MED ORDER — AMIODARONE HCL 200 MG PO TABS
200.0000 mg | ORAL_TABLET | Freq: Every day | ORAL | Status: DC
Start: 2015-11-25 — End: 2015-11-28
  Administered 2015-11-25 – 2015-11-28 (×4): 200 mg via NASOGASTRIC
  Filled 2015-11-24 (×4): qty 1

## 2015-11-24 MED ORDER — FREE WATER
200.0000 mL | Freq: Three times a day (TID) | Status: DC
  Administered 2015-11-24 – 2015-11-28 (×12): 200 mL

## 2015-11-24 MED ORDER — HALOPERIDOL LACTATE 5 MG/ML IJ SOLN
2.5000 mg | Freq: Four times a day (QID) | INTRAMUSCULAR | Status: DC | PRN
  Administered 2015-11-24: 2.5 mg via INTRAVENOUS
  Filled 2015-11-24: qty 1

## 2015-11-24 MED ORDER — AMIODARONE HCL 200 MG PO TABS: 200.0000 mg | ORAL_TABLET | Freq: Every day | ORAL | Status: DC

## 2015-11-24 MED ORDER — POTASSIUM CHLORIDE 20 MEQ/15ML (10%) PO SOLN
20.0000 meq | ORAL | Status: AC
  Administered 2015-11-24 (×2): 20 meq
  Filled 2015-11-24 (×2): qty 15

## 2015-11-24 NOTE — Clinical Social Work Note (Signed)
Clinical Social Worker continuing to follow patient and family for support and discharge planning needs.  Patient wife remains agreeable with patient return to [facility], however there is potential for Palliative Care involvement.  CSW remains available for support and to facilitate patient discharge needs once medically stable.  Macario Golds, [place] [phone]

## 2015-11-24 NOTE — Progress Notes (Signed)
[facility]  Day #6 abtx: vanco, cefepime  ID: 70 yo M with  Hx of CAD s/p cabg, presents with worsening mental status/fever/meningismus following discharge after hospitalization with hydrocephalus requiring EVD. His CT was suggestive of hydrocephalus and given his clinical status, an EVD was placed under low pressure, but CSF was sampled showing a large pleocytosis(despite accounting for RBC) that is neutrophilic predominant. Given fever and  neutrophilic predominant CSF, and meningismus, there is concern for bacterial meningitis. CSF cx may remain sterile due to abtx starting roughly 18hr before LP.  Reason for visit: Follow up on meningitis  Interval History: afebrile, no positive cultures. remains encephalopathic, moves all extremities independently. Receiving haldol for agitation. RN reports that he told his wife that he loves her.  Physical Exam: Constitutional:  Filed Vitals:   11/24/15 1230 11/24/15 1300  BP:  142/99  Pulse: 96 100  Temp:    Resp: 18 24   patient appears in NAD; eyes open but not tracking or following commands Respiratory: Normal respiratory effort; CTA B Cardiovascular: RRR Neuro = independent movement of all extremities arms more so than legs  Review of Systems: Unable to be assessed due to mental status  Lab Results  Component Value Date   WBC 12.8* 11/24/2015   HGB 9.6* 11/24/2015   HCT 30.6* 11/24/2015   MCV 83.8 11/24/2015   PLT 438* 11/24/2015    Lab Results  Component Value Date   CREATININE 1.55* 11/24/2015   BUN 35* 11/24/2015   NA 142 11/24/2015   K 3.5 11/24/2015   CL 111 11/24/2015   CO2 19* 11/24/2015    Lab Results  Component Value Date   ALT 238* 11/20/2015   AST 189* 11/20/2015   ALKPHOS 225* 11/20/2015     Microbiology: Recent Results (from the past 240 hour(s))  Blood Culture (routine x 2)     Status: None (Preliminary result)   Collection Time: 11/19/15 10:27 AM  Result Value Ref Range  Status   Specimen Description BLOOD LEFT ANTECUBITAL  Final   Special Requests BOTTLES DRAWN AEROBIC AND ANAEROBIC 5CC  Final   Culture NO GROWTH 4 DAYS  Final   Report Status PENDING  Incomplete  Urine culture     Status: None   Collection Time: 11/19/15 10:31 AM  Result Value Ref Range Status   Specimen Description URINE, CATHETERIZED  Final   Special Requests NONE  Final   Culture NO GROWTH 1 DAY  Final   Report Status 11/20/2015 FINAL  Final  Blood Culture (routine x 2)     Status: None (Preliminary result)   Collection Time: 11/19/15 10:35 AM  Result Value Ref Range Status   Specimen Description BLOOD RIGHT ANTECUBITAL  Final   Special Requests BOTTLES DRAWN AEROBIC AND ANAEROBIC 5CC  Final   Culture NO GROWTH 4 DAYS  Final   Report Status PENDING  Incomplete  Fungus Culture with Smear     Status: None (Preliminary result)   Collection Time: 11/20/15 12:14 PM  Result Value Ref Range Status   Specimen Description CSF  Final   Special Requests NONE  Final   Fungal Smear   Final    NO YEAST OR FUNGAL ELEMENTS SEEN Performed at [facility]    Culture   Final    CULTURE IN PROGRESS FOR FOUR WEEKS Performed at [facility]    Report Status PENDING  Incomplete  CSF culture     Status: None  Collection Time: 11/20/15 12:14 PM  Result Value Ref Range Status   Specimen Description CSF  Final   Special Requests NONE  Final   Gram Stain   Final    FEW WBC PRESENT,BOTH PMN AND MONONUCLEAR NO ORGANISMS SEEN    Culture NO GROWTH 3 DAYS  Final   Report Status 11/23/2015 FINAL  Final    Impression/Plan:  1. Meningitis - no organisms to date and was on antibiotics prior.  Continue with vancomycin and cefepime. hsv ruled out  If no improvement in cell count will consider antifungal.    2. Encephalopathy - multifactorial. Continue treatment for #1.   3. CKD - slightly improved. will continue to monitor especially since is on vancomycin concern for vanco  toxicity  4. transaminitis - possibly related to sepsis. No prolonged periods of hypotension. Will check hepatitic panel to track trend. If unchanged, recommend to get RUQ u/s

## 2015-11-24 NOTE — Progress Notes (Signed)
Subjective: No significant changes  Exam: Filed Vitals:   11/24/15 1500 11/24/15 1600  BP: 144/93   Pulse: 100   Temp:  97.7 F (36.5 C)  Resp: 32    Gen: In bed, shivering Resp: non-labored breathing, no acute distress Abd: soft, nt  Neuro: MS: opens eyes to voice. Does not clearly follow commands, but does move all extremities purposefully.  CN: PERRL, fixates and tracks, eyes dysconjugate Motor: he moves all extremities spontaneously, markedly tremulous.  Sensory: responds to nox stim x 4.    Impression: 70 yo M with worsening mental status/fever/meningismus following discharge after hospitalization with hydrocephalus requiring EVD. His CT was suggestive of hydrocephalus and given his clinical status, an EVD was placed again. It was under low pressure, but CSF was sampled showing a very large pleocytosis(far outside what could be accounted for by RBC) that is neutrophilic predominant. He then self d/c'ed his EVD by shaking his head, repeat head CT showed pneumocephalus but otherwise relatively stable.   Given the clinical picture of fever, neutrophilic predominant CSF, and meningismus. I suspect that this represents a bacterial meningitis. He did receive cefepime about 18 hours prior to the CSF sample and therefore I would not be surprised that it would be sterilized. He has been started on antibiotics.   He may be having slight improvement per family.   Recommendations: 1) continue antibiotics.  3) Will continue to follow.   Ritta Slot, MD [facility] [phone]  If 7pm- 7am, please page neurology on call as listed in AMION.

## 2015-11-24 NOTE — Progress Notes (Signed)
[facility] ADULT ICU REPLACEMENT PROTOCOL FOR AM LAB REPLACEMENT ONLY  The patient does apply for the [facility] Adult ICU Electrolyte Replacment Protocol based on the criteria listed below:   1. Is GFR >/= 40 ml/min? Yes.    Patient's GFR today is 43 2. Is urine output >/= 0.5 ml/kg/hr for the last 6 hours? Yes.   Patient's UOP is 0.9 ml/kg/hr 3. Is BUN < 60 mg/dL? Yes.    Patient's BUN today is 35 4. Abnormal electrolyte(s): Potassium, 3.5 5. Ordered repletion with: [facility] adult ICU replacement protocol 6. If a panic level lab has been reported, has the CCM MD in charge been notified? Yes.  .   Physician:  Dr. Lanora Manis Deterding  [facility], Alda Berthold E 11/24/2015 6:42 AM

## 2015-11-24 NOTE — Progress Notes (Signed)
PULMONARY / CRITICAL CARE MEDICINE   Name: Jordan [name] MRN: [id number] DOB: 04/04/1946    ADMISSION DATE:  11/19/2015  REFERRING MD:  Francene Castle [place] Pax)   CHIEF COMPLAINT:  Respiratory failure   HISTORY OF PRESENT ILLNESS:   70yo male with hx HTN, OSA, bipolar disorder, CKD III, AFib on coumadin, CAD with recent NSTEMI 12/16 with subsequent CABG at that time.  Just d/c 1/27 after admission for large ICH with intraventricular extension.  He had worsening neurologic status requiring intubation and ventriculostomy. Course was c/b aspiration PNA and he was d/c on Cipro.   He improved slowly and was d/c to SNF with residual aphasia and R hemiparesis.  On 1/29 he was brought to ER with fever 102, tachypnea and decline in mental status.  No reports of cough, hemoptysis, vomiting or diarrhea.  In ER had had persistent hypoxia and tachypnea and PCCM called to admit to ICU.    SUBJECTIVE:  Agitated, not following commands. No fever  VITAL SIGNS: BP 119/82 mmHg  Pulse 94  Temp(Src) 97.9 F (36.6 C) (Axillary)  Resp 30  Ht  (1.753 m)  Wt 198 lb 3.1 oz (89.9 kg)  BMI 29.25 kg/m2  SpO2 100%  HEMODYNAMICS:    VENTILATOR SETTINGS: Vent Mode:  [-]  FiO2 (%):  [40 %] 40 %  INTAKE / OUTPUT: I/O last 3 completed shifts: In: 6787.5 [I.V.:4017.5; NG/GT:2020; IV Piggyback:750] Out: 1335 [Urine:1335]  PHYSICAL EXAMINATION: General:  Opens eyes to commands. Does not respond to questions. Neuro:  Somnolent, aphasic, minimally interactive, PERRL HEENT:  Moist mucus membranes, No thyromegaly, JVD. Cardiovascular:  S1, S2, RRR Lungs:  Clear, no crackles, wheeze. Has intermittent periods of rapid breathing.  Abdomen:  Soft, + BS Musculoskeletal:  Warm and dry, Noedema   LABS:  BMET  Recent Labs Lab 11/22/15 0550 11/23/15 0545 11/24/15 0600  NA 148* 146* 142  K 3.5 3.2* 3.5  CL 113* 113* 111  CO2 20* 21* 19*  BUN 37* 36* 35*  CREATININE 1.80* 1.72* 1.55*  GLUCOSE 115* 129* 129*     Electrolytes  Recent Labs Lab 11/22/15 0550 11/23/15 0545 11/24/15 0600  CALCIUM 8.8* 8.5* 8.2*  MG 2.4 2.3 2.1  PHOS 3.9 3.4 2.6    CBC  Recent Labs Lab 11/22/15 0550 11/23/15 0545 11/24/15 0600  WBC 11.3* 10.6* 12.8*  HGB 10.4* 10.0* 9.6*  HCT 33.9* 32.1* 30.6*  PLT 497* 426* 438*    Coag's No results for input(s): APTT, INR in the last 168 hours.  Sepsis Markers  Recent Labs Lab 11/19/15 1027 11/19/15 1040 11/21/15 0345  LATICACIDVEN  --  1.47 1.7  PROCALCITON 0.27  --   --     ABG  Recent Labs Lab 11/19/15 1103 11/20/15 1016  PHART 7.406 7.473*  PCO2ART 31.0* 24.8*  PO2ART 66.0* 110*    Liver Enzymes  Recent Labs Lab 11/19/15 1027 11/20/15 0258  AST 128* 189*  ALT 161* 238*  ALKPHOS 210* 225*  BILITOT 0.5 0.7  ALBUMIN 3.1* 3.0*    Cardiac Enzymes  Recent Labs Lab 11/20/15 1122 11/20/15 1947 11/20/15 2208  TROPONINI 0.09* 0.07* 0.06*    Glucose  Recent Labs Lab 11/23/15 0751 11/23/15 1243 11/23/15 1558 11/23/15 1929 11/24/15 0316 11/24/15 0841  GLUCAP 116* 112* 100* 126* 115* 97    Imaging Dg Chest Port 1 View  11/24/2015  CLINICAL DATA:  Acute respiratory failure EXAM: PORTABLE CHEST 1 VIEW COMPARISON:  11/23/2015 FINDINGS: Very low lung volumes with central  vascular congestion and basilar atelectasis. Postop changes of the heart. Feeding tube extends into the proximal duodenum with the tip not visualized. No large effusion or pneumothorax. Left subclavian central line tip at the innominate SVC junction. IMPRESSION: Very low lung volumes with central vascular congestion and basilar atelectasis. Electronically Signed   By: Judie Petit.  Shick M.D.   On: 11/24/2015 07:46     STUDIES:  CT head 1/29>>>  Left parietal intraparenchymal hemorrhage not significantly changed.  Stable surrounding edema. Intraventricular extension again noted.  The ventricles are more prominent on today's study suggesting developing hydrocephalus.  Slight left-to-right midline shift, 3 mm.  CULTURES: BC x 2 1/29>>>NTD Urine 1/29>>>NTD CSF 1/30 >>>NTD; Gram stain few WBC   ANTIBIOTICS: Vanc 1/29>> Cefepime 1/29>> Ampicilln  1/30>> 2/1  SIGNIFICANT EVENTS:   LINES/TUBES: Left IJ TLC 02/0 Feed tube Cortrak 02/02  DISCUSSION: 70yo male with multiple medical problems and recent ICH in setting coumadin.  Admitted with fevers, tachypnea, mild hypoxemia and decline in mental status.  CSF c/w acute bacterial meningitis.  ASSESSMENT / PLAN:  PULMONARY Hypoxemia-Improved Tachypnea -Cheyne-Stokes respirations  Recent LLL PNA OSA P:   Supplemental O2  Empiric abx as above  CPAP at HS and prn  CARDIOVASCULAR HTN AFib  CAD - s/p CABG  P:  No anticoagulation PRN labetalol to keep SBP <170 Trend BNP  D/C Amio gtt and start PO amio at home dose Lopressor PRN for afib  RENAL AKI on CKD III - baseline Scr ~1.7; today 1.6 Hyponatremia-Resolved P:   F/u BMP Renally dose Vancomycin  GASTROINTESTINAL Abnormal LFT's  P:   Trend LFT's  Continue tube feeds  HEMATOLOGIC Leukocytosis  P:  F/u CBC  SCD's   INFECTIOUS Fever, leukocytosis Meningitis  P:   Abx per ID F/U cultures  ENDOCRINE Hyperglycemia without a h/o diabetes P:   SSI and Blood glucose monitoring per ICU protocol  NEUROLOGIC AMS -- meningitis ICH - recent in setting of coumadin.  CT head with no acute change. No seizures on EEG. P:   Neuro checks Avoid sedatives Low dose haldol and fentanyl for agitation/comfort  FAMILY  Update: I updated the wife at bedside today 2/3. She reiterated that his living will stated that he did not want resuscitation or intubation. We agree to continue the feeding tube for now provide nutrition support and continue antibiotic treatment. He is still DNR/DNI. We agreed that patient should be transferred to the SDU   Best Practice: Code Status:  DNR/DNI Diet:Tube feeds. GI prophylaxis:  PPI. VTE prophylaxis:   SCD's / High risk for bleeding  Total CC time 35 mins PCCM will sign off. Dr. Sharon Seller with [facility] notified.    Jarry Manon [facility] ANP-BC Pulmonary and Critical Care Medicine [facility] Pager: [phone] 11/24/2015, 11:37 AM

## 2015-11-24 NOTE — Progress Notes (Signed)
Pt given 2.5 mg Haldol for restlessness/agitation. Pt now with restful sleep, but long periods of apnea between breaths followed by gasping and sats dropping into high 80's.  RT called, pt placed back on BiPap at this time.  Pt sats now 100.  Will monitor.

## 2015-11-25 DIAGNOSIS — R402444 Other coma, without documented Glasgow coma scale score, or with partial score reported, 24 hours or more after hospital admission: Secondary | ICD-10-CM

## 2015-11-25 DIAGNOSIS — G009 Bacterial meningitis, unspecified: Principal | ICD-10-CM

## 2015-11-25 DIAGNOSIS — G911 Obstructive hydrocephalus: Secondary | ICD-10-CM

## 2015-11-25 DIAGNOSIS — G039 Meningitis, unspecified: Secondary | ICD-10-CM | POA: Diagnosis present

## 2015-11-25 LAB — CBC
HEMATOCRIT: 30 % — AB (ref 39.0–52.0)
Hemoglobin: 9.2 g/dL — ABNORMAL LOW (ref 13.0–17.0)
MCH: 26 pg (ref 26.0–34.0)
MCHC: 30.7 g/dL (ref 30.0–36.0)
MCV: 84.7 fL (ref 78.0–100.0)
Platelets: 424 10*3/uL — ABNORMAL HIGH (ref 150–400)
RBC: 3.54 MIL/uL — ABNORMAL LOW (ref 4.22–5.81)
RDW: 15.6 % — AB (ref 11.5–15.5)
WBC: 10.3 10*3/uL (ref 4.0–10.5)

## 2015-11-25 LAB — GLUCOSE, CAPILLARY
GLUCOSE-CAPILLARY: 97 mg/dL (ref 65–99)
GLUCOSE-CAPILLARY: 102 mg/dL — AB (ref 65–99)
GLUCOSE-CAPILLARY: 131 mg/dL — AB (ref 65–99)
Glucose-Capillary: 108 mg/dL — ABNORMAL HIGH (ref 65–99)
Glucose-Capillary: 110 mg/dL — ABNORMAL HIGH (ref 65–99)
Glucose-Capillary: 121 mg/dL — ABNORMAL HIGH (ref 65–99)
Glucose-Capillary: 122 mg/dL — ABNORMAL HIGH (ref 65–99)

## 2015-11-25 LAB — BASIC METABOLIC PANEL
Anion gap: 12 (ref 5–15)
BUN: 33 mg/dL — AB (ref 6–20)
CHLORIDE: 110 mmol/L (ref 101–111)
CO2: 19 mmol/L — ABNORMAL LOW (ref 22–32)
Calcium: 8.5 mg/dL — ABNORMAL LOW (ref 8.9–10.3)
Creatinine, Ser: 1.43 mg/dL — ABNORMAL HIGH (ref 0.61–1.24)
GFR calc Af Amer: 55 mL/min — ABNORMAL LOW (ref >60)
GFR calc non Af Amer: 48 mL/min — ABNORMAL LOW (ref >60)
GLUCOSE: 104 mg/dL — AB (ref 65–99)
POTASSIUM: 3.6 mmol/L (ref 3.5–5.1)
Sodium: 141 mmol/L (ref 135–145)

## 2015-11-25 NOTE — Progress Notes (Signed)
Subjective: Appears slightly more engaged this morning.   Exam: Filed Vitals:   11/25/15 0423 11/25/15 0753  BP: 134/86 131/94  Pulse: 102 102  Temp: 98.3 F (36.8 C) 97.8 F (36.6 C)  Resp: 26 28   Gen: In bed, shivering Resp: non-labored breathing, no acute distress Abd: soft, nt  Neuro: MS: opens eyes to voice. Does not clearly follow commands, but does move all extremities purposefully. He engages more readily this morning CN: PERRL, fixates and tracks, Motor: he moves all extremities spontaneously, markedly tremulous.  Sensory: responds to nox stim x 4.    Impression: 70 yo M with worsening mental status/fever/meningismus following discharge after hospitalization with hydrocephalus requiring EVD. His CT was suggestive of hydrocephalus and given his clinical status, an EVD was placed again. It was under low pressure, but CSF was sampled showing a very large pleocytosis(far outside what could be accounted for by RBC) that is neutrophilic predominant. He then self d/c'ed his EVD by shaking his head, repeat head CT showed pneumocephalus but otherwise relatively stable.   Given the clinical picture of fever, neutrophilic predominant CSF, and meningismus. I suspect that this represents a bacterial meningitis. He did receive cefepime about 18 hours prior to the CSF sample and therefore I would not be surprised that it would be sterilized. He has been started on antibiotics.   I suspect that he has had a worsening in his underlying aphasia due to the meningitis and will have to recover from this.   Recommendations: 1) antibiotics per ID.  3) Will continue to follow.   Ritta Slot, MD [facility] [phone]  If 7pm- 7am, please page neurology on call as listed in AMION.

## 2015-11-25 NOTE — Progress Notes (Signed)
[place] TEAM 1 - Stepdown/ICU TEAM PROGRESS NOTE  Jordan [name] [facility] ZOX:[id number] DOB: 1945/10/29 DOA: 11/19/2015 PCP: Emeterio Reeve, MD  Admit HPI / Brief Narrative: 70yo M with recent large left parietal intraparenchymal hemorrhage (w/ resultant aphasia and R hemiparesis, CKD 3, CAD w/ NSTEMI Dec 2016 > CABG, bipolar d/o, and paroxysmal afib who was readmitted on 1/29 with fever to 102 and altered mental status ultimately found to be due to bacterial meningitis. CSF cultures were negative as he was already on abx but had high cell count with high protein and low glucose. He had a ventric drain placed temporarily for question of hydrocephalus but he pulled it out. Repeat CT was unchanged. No plan to replace the drain as his ICPs were low.   HPI/Subjective: The patient is noncommunicative.  At times he will open his eyes but he is not reliably tracking the examiner.  The patient's brother is visiting at the bedside and states that the patient has not attempted to talk to him while he has been there.  Assessment/Plan:  Meningitis  Cont abx tx per ID   Altered mental status  Due to meningitis + recent ICH - no sign of significant improvement at this time  Hypoxemia Resolved with saturation 100% on room air  Recent LLL PNA  OSA  HTN BP reasonably well controlled at this time  AFib  presently in sinus w/ a mild tachycardia   CAD - s/p CABG   AKI on CKD III baseline Scr ~1.7 - crt slowly improving   Hyponatremia Resolved  Abnormal LFT's  Have been trending down - recheck in AM  Hyperglycemia without a h/o diabetes  ICH - recent in setting of coumadin  Code Status: DNR - NO CODE - BIPAP OK Family Communication: Spoke with the patient's brother at bedside Disposition Plan: SDU  Consultants: Neurology  NS PCCM ID  Antibiotics: Vanc 1/29 > Cefepime 1/29 > Ampicilln 1/30 > 2/1  DVT prophylaxis: SCDs  Objective: Blood pressure 131/79, pulse 101,  temperature 98.2 F (36.8 C), temperature source Axillary, resp. rate 24, height 5\' 9"  (1.753 m), weight 92.2 kg (203 lb 4.2 oz), SpO2 100 %.  Intake/Output Summary (Last 24 hours) at 11/25/15 1317 Last data filed at 11/25/15 1200  Gross per 24 hour  Intake 1303.83 ml  Output   1525 ml  Net -221.17 ml   Exam: General: No acute respiratory distress Lungs: Clear to auscultation bilaterally without wheezes or crackles Cardiovascular: Regular rate and rhythm without murmur gallop or rub normal S1 and S2 Abdomen: Nontender, overweight, soft, bowel sounds positive, no rebound, no ascites, no appreciable mass Extremities: No significant cyanosis, clubbing;  trace edema bilateral lower extremities  Data Reviewed: Basic Metabolic Panel:  Recent Labs Lab 11/20/15 0258 11/21/15 0645 11/22/15 0550 11/23/15 0545 11/24/15 0600 11/25/15 0445  NA 149* 149* 148* 146* 142 141  K 3.7 3.9 3.5 3.2* 3.5 3.6  CL 119* 116* 113* 113* 111 110  CO2 19* 19* 20* 21* 19* 19*  GLUCOSE 124* 114* 115* 129* 129* 104*  BUN 54* 43* 37* 36* 35* 33*  CREATININE 2.23* 1.89* 1.80* 1.72* 1.55* 1.43*  CALCIUM 8.9 9.1 8.8* 8.5* 8.2* 8.5*  MG 2.8* 2.6* 2.4 2.3 2.1  --   PHOS 4.8* 3.8 3.9 3.4 2.6  --     CBC:  Recent Labs Lab 11/19/15 1027  11/21/15 0645 11/22/15 0550 11/23/15 0545 11/24/15 0600 11/25/15 0445  WBC 23.9*  < > 15.3* 11.3* 10.6* 12.8* 10.3  NEUTROABS 21.0*  --   --   --   --   --   --   HGB 11.6*  < > 11.7* 10.4* 10.0* 9.6* 9.2*  HCT 36.6*  < > 37.3* 33.9* 32.1* 30.6* 30.0*  MCV 83.8  < > 84.6 85.6 84.9 83.8 84.7  PLT 563*  < > 524* 497* 426* 438* 424*  < > = values in this interval not displayed.  Liver Function Tests:  Recent Labs Lab 11/19/15 1027 11/20/15 0258 11/24/15 1344  AST 128* 189* 74*  ALT 161* 238* 224*  ALKPHOS 210* 225* 194*  BILITOT 0.5 0.7 0.1*  PROT 7.5 7.4 5.9*  ALBUMIN 3.1* 3.0* 2.5*   Cardiac Enzymes:  Recent Labs Lab 11/20/15 1122 11/20/15 1947  11/20/15 2208  TROPONINI 0.09* 0.07* 0.06*    CBG:  Recent Labs Lab 11/24/15 1954 11/25/15 11/25/15 0423 11/25/15 0751 11/25/15 1202  GLUCAP 121* 122* 97 102* 131*    Recent Results (from the past 240 hour(s))  Blood Culture (routine x 2)     Status: None   Collection Time: 11/19/15 10:27 AM  Result Value Ref Range Status   Specimen Description BLOOD LEFT ANTECUBITAL  Final   Special Requests BOTTLES DRAWN AEROBIC AND ANAEROBIC 5CC  Final   Culture NO GROWTH 5 DAYS  Final   Report Status 11/24/2015 FINAL  Final  Urine culture     Status: None   Collection Time: 11/19/15 10:31 AM  Result Value Ref Range Status   Specimen Description URINE, CATHETERIZED  Final   Special Requests NONE  Final   Culture NO GROWTH 1 DAY  Final   Report Status 11/20/2015 FINAL  Final  Blood Culture (routine x 2)     Status: None   Collection Time: 11/19/15 10:35 AM  Result Value Ref Range Status   Specimen Description BLOOD RIGHT ANTECUBITAL  Final   Special Requests BOTTLES DRAWN AEROBIC AND ANAEROBIC 5CC  Final   Culture NO GROWTH 5 DAYS  Final   Report Status 11/24/2015 FINAL  Final  Fungus Culture with Smear     Status: None (Preliminary result)   Collection Time: 11/20/15 12:14 PM  Result Value Ref Range Status   Specimen Description CSF  Final   Special Requests NONE  Final   Fungal Smear   Final    NO YEAST OR FUNGAL ELEMENTS SEEN Performed at [facility]    Culture   Final    CULTURE IN PROGRESS FOR FOUR WEEKS Performed at [facility]    Report Status PENDING  Incomplete  CSF culture     Status: None   Collection Time: 11/20/15 12:14 PM  Result Value Ref Range Status   Specimen Description CSF  Final   Special Requests NONE  Final   Gram Stain   Final    FEW WBC PRESENT,BOTH PMN AND MONONUCLEAR NO ORGANISMS SEEN    Culture NO GROWTH 3 DAYS  Final   Report Status 11/23/2015 FINAL  Final     Studies:   Recent x-ray studies have been reviewed in  detail by the Attending Physician  Scheduled Meds:  Scheduled Meds: . amiodarone  200 mg Per NG tube Daily  . antiseptic oral rinse  7 mL Mouth Rinse q12n4p  . ceFEPime (MAXIPIME) IV  2 g Intravenous Q12H  . chlorhexidine  15 mL Mouth Rinse BID  . free water  200 mL Per Tube 3 times per day  . insulin aspart  0-15 Units  Subcutaneous 6 times per day  . pantoprazole (PROTONIX) IV  40 mg Intravenous QHS  . vancomycin  1,000 mg Intravenous Q24H    Time spent on care of this patient: 35 mins   MCCLUNG,JEFFREY T , MD   [facility] Office  [phone] Pager - Text Page per Loretha Stapler as per below:  On-Call/Text Page:      [email or web address]      password [email or web address]  If 7PM-7AM, please contact night-coverage [email or web address] Password [email or web address] 11/25/2015, 1:17 PM   LOS: 6 days

## 2015-11-25 NOTE — Progress Notes (Signed)
Subjective:  Patient still encephalopathic   Antibiotics:  Anti-infectives    Start     Dose/Rate Route Frequency Ordered Stop   11/20/15 2200  ceFEPIme (MAXIPIME) 2 g in dextrose 5 % 50 mL IVPB     2 g 100 mL/hr over 30 Minutes Intravenous Every 12 hours 11/20/15 1030     11/20/15 1200  ampicillin (OMNIPEN) 2 g in sodium chloride 0.9 % 50 mL IVPB  Status:  Discontinued     2 g 150 mL/hr over 20 Minutes Intravenous 4 times per day 11/20/15 1030 11/22/15 1230   11/20/15 1100  vancomycin (VANCOCIN) IVPB 1000 mg/200 mL premix     1,000 mg 200 mL/hr over 60 Minutes Intravenous Every 24 hours 11/19/15 1307     11/19/15 1100  vancomycin (VANCOCIN) 2,000 mg in sodium chloride 0.9 % 500 mL IVPB     2,000 mg 250 mL/hr over 120 Minutes Intravenous  Once 11/19/15 1027 11/19/15 1303   11/19/15 1100  ceFEPIme (MAXIPIME) 2 g in dextrose 5 % 50 mL IVPB  Status:  Discontinued     2 g 100 mL/hr over 30 Minutes Intravenous Every 24 hours 11/19/15 1027 11/20/15 1030      Medications: Scheduled Meds: . amiodarone  200 mg Per NG tube Daily  . antiseptic oral rinse  7 mL Mouth Rinse q12n4p  . ceFEPime (MAXIPIME) IV  2 g Intravenous Q12H  . chlorhexidine  15 mL Mouth Rinse BID  . free water  200 mL Per Tube 3 times per day  . insulin aspart  0-15 Units Subcutaneous 6 times per day  . pantoprazole (PROTONIX) IV  40 mg Intravenous QHS  . vancomycin  1,000 mg Intravenous Q24H   Continuous Infusions: . feeding supplement (VITAL AF 1.2 CAL) 1,000 mL (11/25/15 1200)   PRN Meds:.fentaNYL (SUBLIMAZE) injection, haloperidol lactate, metoprolol    Objective: Weight change: 5 lb 1.1 oz (2.3 kg)  Intake/Output Summary (Last 24 hours) at 11/25/15 1344 Last data filed at 11/25/15 1200  Gross per 24 hour  Intake 1303.83 ml  Output   1525 ml  Net -221.17 ml   Blood pressure 131/79, pulse 101, temperature 98.2 F (36.8 C), temperature source Axillary, resp. rate 24, height  (1.753  m), weight 203 lb 4.2 oz (92.2 kg), SpO2 100 %. Temp:  [97.6 F (36.4 C)-98.3 F (36.8 C)] 98.2 F (36.8 C) (02/04 1200) Pulse Rate:  [97-104] 101 (02/04 1200) Resp:  [17-37] 24 (02/04 1200) BP: (98-144)/(59-106) 131/79 mmHg (02/04 1200) SpO2:  [100 %] 100 % (02/04 1200) FiO2 (%):  [40 %] 40 % (02/04 0753) Weight:  [203 lb 4.2 oz (92.2 kg)] 203 lb 4.2 oz (92.2 kg) (02/04 0423)  Physical Exam: General: Alert and has been or is more responsive with his friend present  HEENT:EOMI CVS regular rate, normal r,  no murmur rubs or gallops Chest: clear to auscultation bilaterally, no wheezing, rales or rhonchi Abdomen: soft  nondistended, Extremities: no  clubbing or edema noted bilaterally Skin: no rashes  Neuro: I thought he had some  right sided neglect weakness and slight facial left facial droop family tell me these are not new findngs and Neuro examined him subsequent to my exam this am  CBC: CBC Latest Ref Rng 11/25/2015 11/24/2015 11/23/2015  WBC 4.0 - 10.5 K/uL 10.3 12.8(H) 10.6(H)  Hemoglobin 13.0 - 17.0 g/dL 4.0(J) 8.1(X) 10.0(L)  Hematocrit 39.0 - 52.0 % 30.0(L) 30.6(L) 32.1(L)  Platelets 150 -  400 K/uL 424(H) 438(H) 426(H)       BMET  Recent Labs  11/24/15 0600 11/25/15 0445  NA 142 141  K 3.5 3.6  CL 111 110  CO2 19* 19*  GLUCOSE 129* 104*  BUN 35* 33*  CREATININE 1.55* 1.43*  CALCIUM 8.2* 8.5*     Liver Panel   Recent Labs  11/24/15 1344  PROT 5.9*  ALBUMIN 2.5*  AST 74*  ALT 224*  ALKPHOS 194*  BILITOT 0.1*  BILIDIR <0.1*  IBILI NOT CALCULATED       Sedimentation Rate No results for input(s): ESRSEDRATE in the last 72 hours. C-Reactive Protein No results for input(s): CRP in the last 72 hours.  Micro Results: Recent Results (from the past 720 hour(s))  Urine culture     Status: None   Collection Time: 11/05/15  7:51 AM  Result Value Ref Range Status   Specimen Description URINE, CATHETERIZED  Final   Special Requests NONE  Final    Culture NO GROWTH 1 DAY  Final   Report Status 11/06/2015 FINAL  Final  Blood culture (routine x 2)     Status: None   Collection Time: 11/05/15  8:40 AM  Result Value Ref Range Status   Specimen Description BLOOD RIGHT ANTECUBITAL  Final   Special Requests BOTTLES DRAWN AEROBIC AND ANAEROBIC 5CC  Final   Culture NO GROWTH 5 DAYS  Final   Report Status 11/10/2015 FINAL  Final  Blood culture (routine x 2)     Status: None   Collection Time: 11/05/15  8:48 AM  Result Value Ref Range Status   Specimen Description BLOOD LEFT ANTECUBITAL  Final   Special Requests BOTTLES DRAWN AEROBIC AND ANAEROBIC 5CC  Final   Culture NO GROWTH 5 DAYS  Final   Report Status 11/10/2015 FINAL  Final  MRSA PCR Screening     Status: None   Collection Time: 11/05/15 11:18 AM  Result Value Ref Range Status   MRSA by PCR NEGATIVE NEGATIVE Final    Comment:        The GeneXpert MRSA Assay (FDA approved for NASAL specimens only), is one component of a comprehensive MRSA colonization surveillance program. It is not intended to diagnose MRSA infection nor to guide or monitor treatment for MRSA infections.   Culture, Urine     Status: None   Collection Time: 11/12/15 12:07 AM  Result Value Ref Range Status   Specimen Description URINE, RANDOM  Final   Special Requests none Normal  Final   Culture >=100,000 COLONIES/mL ESCHERICHIA COLI  Final   Report Status 11/14/2015 FINAL  Final   Organism ID, Bacteria ESCHERICHIA COLI  Final      Susceptibility   Escherichia coli - MIC*    AMPICILLIN >=32 RESISTANT Resistant     CEFAZOLIN <=4 SENSITIVE Sensitive     CEFTRIAXONE <=1 SENSITIVE Sensitive     CIPROFLOXACIN 1 SENSITIVE Sensitive     GENTAMICIN <=1 SENSITIVE Sensitive     IMIPENEM <=0.25 SENSITIVE Sensitive     NITROFURANTOIN <=16 SENSITIVE Sensitive     TRIMETH/SULFA >=320 RESISTANT Resistant     AMPICILLIN/SULBACTAM 16 INTERMEDIATE Intermediate     PIP/TAZO <=4 SENSITIVE Sensitive     * >=100,000  COLONIES/mL ESCHERICHIA COLI  Blood Culture (routine x 2)     Status: None   Collection Time: 11/19/15 10:27 AM  Result Value Ref Range Status   Specimen Description BLOOD LEFT ANTECUBITAL  Final   Special Requests BOTTLES DRAWN AEROBIC  AND ANAEROBIC 5CC  Final   Culture NO GROWTH 5 DAYS  Final   Report Status 11/24/2015 FINAL  Final  Urine culture     Status: None   Collection Time: 11/19/15 10:31 AM  Result Value Ref Range Status   Specimen Description URINE, CATHETERIZED  Final   Special Requests NONE  Final   Culture NO GROWTH 1 DAY  Final   Report Status 11/20/2015 FINAL  Final  Blood Culture (routine x 2)     Status: None   Collection Time: 11/19/15 10:35 AM  Result Value Ref Range Status   Specimen Description BLOOD RIGHT ANTECUBITAL  Final   Special Requests BOTTLES DRAWN AEROBIC AND ANAEROBIC 5CC  Final   Culture NO GROWTH 5 DAYS  Final   Report Status 11/24/2015 FINAL  Final  Fungus Culture with Smear     Status: None (Preliminary result)   Collection Time: 11/20/15 12:14 PM  Result Value Ref Range Status   Specimen Description CSF  Final   Special Requests NONE  Final   Fungal Smear   Final    NO YEAST OR FUNGAL ELEMENTS SEEN Performed at [facility]    Culture   Final    CULTURE IN PROGRESS FOR FOUR WEEKS Performed at [facility]    Report Status PENDING  Incomplete  CSF culture     Status: None   Collection Time: 11/20/15 12:14 PM  Result Value Ref Range Status   Specimen Description CSF  Final   Special Requests NONE  Final   Gram Stain   Final    FEW WBC PRESENT,BOTH PMN AND MONONUCLEAR NO ORGANISMS SEEN    Culture NO GROWTH 3 DAYS  Final   Report Status 11/23/2015 FINAL  Final    Studies/Results: Dg Chest Port 1 View  11/24/2015  CLINICAL DATA:  Acute respiratory failure EXAM: PORTABLE CHEST 1 VIEW COMPARISON:  11/23/2015 FINDINGS: Very low lung volumes with central vascular congestion and basilar atelectasis. Postop changes of the  heart. Feeding tube extends into the proximal duodenum with the tip not visualized. No large effusion or pneumothorax. Left subclavian central line tip at the innominate SVC junction. IMPRESSION: Very low lung volumes with central vascular congestion and basilar atelectasis. Electronically Signed   By: Judie Petit.  Shick M.D.   On: 11/24/2015 07:46      Assessment/Plan:  INTERVAL HISTORY:  11/25/15: Her family patient more interactive today.   Active Problems:   Altered mental status   Obstructive hydrocephalus   Acute respiratory failure (HCC)   Bacterial meningitis   Encounter for central line placement    ELLIE SPICKLER is a 70 y.o. male with  Meningitis after EVD placment for hydrocephalus currently on vancomycin and cefepime with negative cultures though patient had received antecedent antibiotics  #1 Meningitis:  I suspect his antecedent antibiotics may have compromised the cultures. He seems to be stabilizing in terms of his fever curve and may be with some neurocognitive improvement if he is becoming more interactive.  Continue current antibiotics I would not add an antifungal at this point in time   LOS: 6 days   Acey Lav 11/25/2015, 1:44 PM

## 2015-11-26 LAB — IRON AND TIBC
Iron: 24 ug/dL — ABNORMAL LOW (ref 45–182)
SATURATION RATIOS: 10 % — AB (ref 17.9–39.5)
TIBC: 234 ug/dL — AB (ref 250–450)
UIBC: 210 ug/dL

## 2015-11-26 LAB — VITAMIN B12: VITAMIN B 12: 366 pg/mL (ref 180–914)

## 2015-11-26 LAB — GLUCOSE, CAPILLARY
GLUCOSE-CAPILLARY: 109 mg/dL — AB (ref 65–99)
GLUCOSE-CAPILLARY: 109 mg/dL — AB (ref 65–99)
GLUCOSE-CAPILLARY: 113 mg/dL — AB (ref 65–99)
GLUCOSE-CAPILLARY: 87 mg/dL (ref 65–99)
Glucose-Capillary: 121 mg/dL — ABNORMAL HIGH (ref 65–99)
Glucose-Capillary: 95 mg/dL (ref 65–99)
Glucose-Capillary: 117 mg/dL — ABNORMAL HIGH (ref 65–99)

## 2015-11-26 LAB — COMPREHENSIVE METABOLIC PANEL
ALK PHOS: 157 U/L — AB (ref 38–126)
ALT: 110 U/L — ABNORMAL HIGH (ref 17–63)
ANION GAP: 9 (ref 5–15)
AST: 29 U/L (ref 15–41)
Albumin: 2.4 g/dL — ABNORMAL LOW (ref 3.5–5.0)
BUN: 30 mg/dL — ABNORMAL HIGH (ref 6–20)
CALCIUM: 8.3 mg/dL — AB (ref 8.9–10.3)
CO2: 21 mmol/L — AB (ref 22–32)
Chloride: 114 mmol/L — ABNORMAL HIGH (ref 101–111)
Creatinine, Ser: 1.32 mg/dL — ABNORMAL HIGH (ref 0.61–1.24)
GFR calc non Af Amer: 53 mL/min — ABNORMAL LOW (ref >60)
Glucose, Bld: 108 mg/dL — ABNORMAL HIGH (ref 65–99)
Potassium: 3.4 mmol/L — ABNORMAL LOW (ref 3.5–5.1)
SODIUM: 144 mmol/L (ref 135–145)
TOTAL PROTEIN: 5.3 g/dL — AB (ref 6.5–8.1)
Total Bilirubin: 0.6 mg/dL (ref 0.3–1.2)

## 2015-11-26 LAB — CBC
HCT: 28.8 % — ABNORMAL LOW (ref 39.0–52.0)
HEMOGLOBIN: 9 g/dL — AB (ref 13.0–17.0)
MCH: 26.2 pg (ref 26.0–34.0)
MCHC: 31.3 g/dL (ref 30.0–36.0)
MCV: 83.7 fL (ref 78.0–100.0)
Platelets: 393 10*3/uL (ref 150–400)
RBC: 3.44 MIL/uL — AB (ref 4.22–5.81)
RDW: 15.7 % — ABNORMAL HIGH (ref 11.5–15.5)
WBC: 11.2 10*3/uL — ABNORMAL HIGH (ref 4.0–10.5)

## 2015-11-26 LAB — FERRITIN: FERRITIN: 66 ng/mL (ref 24–336)

## 2015-11-26 LAB — RETICULOCYTES
RBC.: 3.44 MIL/uL — AB (ref 4.22–5.81)
RETIC COUNT ABSOLUTE: 58.5 10*3/uL (ref 19.0–186.0)
RETIC CT PCT: 1.7 % (ref 0.4–3.1)

## 2015-11-26 LAB — FOLATE: FOLATE: 20.4 ng/mL (ref >5.9)

## 2015-11-26 MED ORDER — POTASSIUM CHLORIDE 20 MEQ/15ML (10%) PO SOLN
40.0000 meq | Freq: Two times a day (BID) | ORAL | Status: AC
  Administered 2015-11-26 – 2015-11-27 (×3): 40 meq
  Filled 2015-11-26 (×3): qty 30

## 2015-11-26 NOTE — Progress Notes (Signed)
Subjective: No significant changes  Exam: Filed Vitals:   11/26/15 1952 11/26/15 1955  BP:  124/101  Pulse:  99  Temp: 98 F (36.7 C)   Resp:  28   Gen: In bed, shivering Resp: non-labored breathing, no acute distress Abd: soft, nt  Neuro: MS: opens eyes to voice. Does not clearly follow commands, but does move all extremities purposefully. He engages readily  CN: PERRL, fixates and tracks, Motor: he moves all extremities spontaneously, he clearly has some right-sided weakness Sensory: responds to nox stim x 4.    Impression: 70 yo M with worsening mental status/fever/meningismus following discharge after hospitalization with hydrocephalus requiring EVD. His CT was suggestive of hydrocephalus and given his clinical status, an EVD was placed again. It was under low pressure, but CSF was sampled showing a very large pleocytosis(far outside what could be accounted for by RBC) that is neutrophilic predominant. He then self d/c'ed his EVD by shaking his head, repeat head CT showed pneumocephalus but otherwise relatively stable.   Given the clinical picture of fever, neutrophilic predominant CSF, and meningismus. I suspect that this represents a bacterial meningitis. He did receive cefepime about 18 hours prior to the CSF sample and therefore I would not be surprised that it would be sterilized. He has been started on antibiotics.   I suspect that he has had a worsening in his underlying aphasia due to the meningitis and will have to recover from this.   Recommendations: 1) antibiotics per ID.  3) Will continue to follow.   Ritta Slot, MD [facility] [phone]  If 7pm- 7am, please page neurology on call as listed in AMION.

## 2015-11-26 NOTE — Progress Notes (Signed)
ANTIBIOTIC CONSULT NOTE - INITIAL  Pharmacy Consult for Cefepime/Vancomycin Indication: meningitis  No Known Allergies  Patient Measurements: Height:  (175.3 cm) Weight: 196 lb 13.9 oz (89.3 kg) IBW/kg (Calculated) : 70.7  Vital Signs: Temp: 97.7 F (36.5 C) (02/05 1300) Temp Source: Axillary (02/05 1300) BP: 123/91 mmHg (02/05 0812) Pulse Rate: 98 (02/05 0812) Intake/Output from previous day: 02/04 0701 - 02/05 0700 In: 1370 [NG/GT:1370] Out: 700 [Urine:700] Intake/Output from this shift: Total I/O In: 250 [IV Piggyback:250] Out: -   Labs:  Recent Labs  11/24/15 0600 11/25/15 0445 11/26/15 0515  WBC 12.8* 10.3 11.2*  HGB 9.6* 9.2* 9.0*  PLT 438* 424* 393  CREATININE 1.55* 1.43* 1.32*   Estimated Creatinine Clearance: 56.7 mL/min (by C-G formula based on Cr of 1.32). No results for input(s): VANCOTROUGH, VANCOPEAK, VANCORANDOM, GENTTROUGH, GENTPEAK, GENTRANDOM, TOBRATROUGH, TOBRAPEAK, TOBRARND, AMIKACINPEAK, AMIKACINTROU, AMIKACIN in the last 72 hours.   Microbiology: Recent Results (from the past 720 hour(s))  Urine culture     Status: None   Collection Time: 11/05/15  7:51 AM  Result Value Ref Range Status   Specimen Description URINE, CATHETERIZED  Final   Special Requests NONE  Final   Culture NO GROWTH 1 DAY  Final   Report Status 11/06/2015 FINAL  Final  Blood culture (routine x 2)     Status: None   Collection Time: 11/05/15  8:40 AM  Result Value Ref Range Status   Specimen Description BLOOD RIGHT ANTECUBITAL  Final   Special Requests BOTTLES DRAWN AEROBIC AND ANAEROBIC 5CC  Final   Culture NO GROWTH 5 DAYS  Final   Report Status 11/10/2015 FINAL  Final  Blood culture (routine x 2)     Status: None   Collection Time: 11/05/15  8:48 AM  Result Value Ref Range Status   Specimen Description BLOOD LEFT ANTECUBITAL  Final   Special Requests BOTTLES DRAWN AEROBIC AND ANAEROBIC 5CC  Final   Culture NO GROWTH 5 DAYS  Final   Report Status  11/10/2015 FINAL  Final  MRSA PCR Screening     Status: None   Collection Time: 11/05/15 11:18 AM  Result Value Ref Range Status   MRSA by PCR NEGATIVE NEGATIVE Final    Comment:        The GeneXpert MRSA Assay (FDA approved for NASAL specimens only), is one component of a comprehensive MRSA colonization surveillance program. It is not intended to diagnose MRSA infection nor to guide or monitor treatment for MRSA infections.   Culture, Urine     Status: None   Collection Time: 11/12/15 12:07 AM  Result Value Ref Range Status   Specimen Description URINE, RANDOM  Final   Special Requests none Normal  Final   Culture >=100,000 COLONIES/mL ESCHERICHIA COLI  Final   Report Status 11/14/2015 FINAL  Final   Organism ID, Bacteria ESCHERICHIA COLI  Final      Susceptibility   Escherichia coli - MIC*    AMPICILLIN >=32 RESISTANT Resistant     CEFAZOLIN <=4 SENSITIVE Sensitive     CEFTRIAXONE <=1 SENSITIVE Sensitive     CIPROFLOXACIN 1 SENSITIVE Sensitive     GENTAMICIN <=1 SENSITIVE Sensitive     IMIPENEM <=0.25 SENSITIVE Sensitive     NITROFURANTOIN <=16 SENSITIVE Sensitive     TRIMETH/SULFA >=320 RESISTANT Resistant     AMPICILLIN/SULBACTAM 16 INTERMEDIATE Intermediate     PIP/TAZO <=4 SENSITIVE Sensitive     * >=100,000 COLONIES/mL ESCHERICHIA COLI  Blood Culture (routine x 2)  Status: None   Collection Time: 11/19/15 10:27 AM  Result Value Ref Range Status   Specimen Description BLOOD LEFT ANTECUBITAL  Final   Special Requests BOTTLES DRAWN AEROBIC AND ANAEROBIC 5CC  Final   Culture NO GROWTH 5 DAYS  Final   Report Status 11/24/2015 FINAL  Final  Urine culture     Status: None   Collection Time: 11/19/15 10:31 AM  Result Value Ref Range Status   Specimen Description URINE, CATHETERIZED  Final   Special Requests NONE  Final   Culture NO GROWTH 1 DAY  Final   Report Status 11/20/2015 FINAL  Final  Blood Culture (routine x 2)     Status: None   Collection Time: 11/19/15  10:35 AM  Result Value Ref Range Status   Specimen Description BLOOD RIGHT ANTECUBITAL  Final   Special Requests BOTTLES DRAWN AEROBIC AND ANAEROBIC 5CC  Final   Culture NO GROWTH 5 DAYS  Final   Report Status 11/24/2015 FINAL  Final  Fungus Culture with Smear     Status: None (Preliminary result)   Collection Time: 11/20/15 12:14 PM  Result Value Ref Range Status   Specimen Description CSF  Final   Special Requests NONE  Final   Fungal Smear   Final    NO YEAST OR FUNGAL ELEMENTS SEEN Performed at [facility]    Culture   Final    CULTURE IN PROGRESS FOR FOUR WEEKS Performed at [facility]    Report Status PENDING  Incomplete  CSF culture     Status: None   Collection Time: 11/20/15 12:14 PM  Result Value Ref Range Status   Specimen Description CSF  Final   Special Requests NONE  Final   Gram Stain   Final    FEW WBC PRESENT,BOTH PMN AND MONONUCLEAR NO ORGANISMS SEEN    Culture NO GROWTH 3 DAYS  Final   Report Status 11/23/2015 FINAL  Final    Medical History: Past Medical History  Diagnosis Date  . Hypertension   . Kidney stone   . Bipolar 1 disorder (HCC)   . Diverticulitis   . Anxiety   . Arthritis   . Sleep apnea   . Chronic headaches   . Stage III chronic kidney disease     Baseline creatinine 1.8  . Left inguinal hernia   . Atherosclerosis   . CAD (coronary artery disease)     a. 10/11/2015: total occlusion of OM1 and LCx, 50% stenosis in distal LM, 80% stenosis in LAD, 99% stenosis in RCA --> CABG recommended.  . Hiatal hernia   . 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 Antritis (stomach)     Medications:  Scheduled:  . amiodarone  200 mg Per NG tube Daily  . antiseptic oral rinse  7 mL Mouth Rinse q12n4p  . ceFEPime (MAXIPIME) IV  2 g Intravenous Q12H  . chlorhexidine  15 mL Mouth Rinse BID  . free water  200 mL Per Tube 3 times per day  . insulin aspart  0-15 Units Subcutaneous 6 times per day  . pantoprazole (PROTONIX) IV   40 mg Intravenous QHS  . potassium chloride  40 mEq Per Tube BID  . vancomycin  1,000 mg Intravenous Q24H   Infusions:  . feeding supplement (VITAL AF 1.2 CAL) 1,000 mL (11/26/15 0100)   PRN: fentaNYL (SUBLIMAZE) injection, haloperidol lactate, metoprolol Assessment: Jordan [name] is a 70 y.o. male with Meningitis after EVD placment for hydrocephalus currently on  vancomycin and cefepime with negative cultures though patient had received antibiotics prior to CSF culture. Pt is currently followed by ID.   Cefepime 1/29>> Vancomycin 1/29>> Ampicillin 1/30>>2/1  CSF - low gluc, high protein, high WBC, high RBC  1/29 Blood x 2 - NGTD 1/29 Urine - NEG 1/30 CSF - NGTD  2/2 VT = 17 on 1gm Q24H  Scr 1.43>1.32, CrCl ~56 ml/min, UOP decreased 0.8 >0.3 ml/kg/hr  Goal of Therapy:  Vancomycin trough level 15-20 mcg/ml  Plan:  - Continue vanc 1gm IV Q24H - Continue cefepime 2gm IV Q12H - F/u renal fxn, C&S, clinical status  - Will gorder vanc trough on 2/6 since renal function is showing improvement.   Hazle Nordmann, PharmD Pharmacy Resident [phone] 11/26/2015,1:23 PM

## 2015-11-26 NOTE — Progress Notes (Signed)
[place] TEAM 1 - Stepdown/ICU TEAM PROGRESS NOTE  Jordan [name]-[facility] ZOX:[id number] DOB: 01/12/1946 DOA: 11/19/2015 PCP: Emeterio Reeve, MD  Admit HPI / Brief Narrative: 70yo M with recent large left parietal intraparenchymal hemorrhage (w/ resultant aphasia and R hemiparesis, CKD 3, CAD w/ NSTEMI Dec 2016 > CABG, bipolar d/o, and paroxysmal afib who was readmitted on 1/29 with fever to 102 and altered mental status ultimately found to be due to bacterial meningitis. CSF cultures were negative as he was already on abx but had high cell count with high protein and low glucose. He had a ventric drain placed temporarily for question of hydrocephalus but he pulled it out. Repeat CT was unchanged. No plan to replace the drain as his ICPs were low.   HPI/Subjective: The patient is noncommunicative.  He will open his eyes to voice but does not follow the examiner or react to questions or commands.  Assessment/Plan:  Meningitis  Cont abx tx per ID   Altered mental status  Due to meningitis + recent ICH - no sign of significant improvement at this time - I am concerned that the patient may not improve further beyond his current status  Hypoxemia Resolved with saturation 100% on room air  Recent LLL PNA No evidence of active pulmonary infection at present  OSA Continue nightly BiPAP  HTN BP well controlled at this time  AFib  presently in sinus w/ a mild sinus tachycardia   CAD - s/p CABG   AKI on CKD III baseline Scr ~1.7 - crt cont to improve and is now better than reported baseline   Hyponatremia Resolved  Hypokalemia Replace and follow   Abnormal LFT's  Continue to trend down - approaching normal range   Hyperglycemia without a h/o diabetes Likely due to acute illness + tube feeds - follow trend   Recent ICH in setting of coumadin  Code Status: DNR - NO CODE - BIPAP OK Family Communication: No family present at time of exam today Disposition Plan:  SDU  Consultants: Neurology  NS PCCM ID  Antibiotics: Vanc 1/29 > Cefepime 1/29 > Ampicilln 1/30 > 2/1  DVT prophylaxis: SCDs  Objective: Blood pressure 132/86, pulse 101, temperature 98 F (36.7 C), temperature source Axillary, resp. rate 25, height  (1.753 m), weight 89.3 kg (196 lb 13.9 oz), SpO2 100 %.  Intake/Output Summary (Last 24 hours) at 11/26/15 1032 Last data filed at 11/26/15 0100  Gross per 24 hour  Intake   1175 ml  Output    700 ml  Net    475 ml   Exam: General: No acute respiratory distress - obtunded  Lungs: Clear to auscultation bilaterally  Cardiovascular: Regular rate and rhythm without murmur gallop or rub Abdomen: Nontender, overweight, soft, bowel sounds positive, no rebound, no ascites, no appreciable mass Extremities: No significant cyanosis, clubbing;  trace edema bilateral lower extremities stable   Data Reviewed: Basic Metabolic Panel:  Recent Labs Lab 11/20/15 0258 11/21/15 0645 11/22/15 0550 11/23/15 0545 11/24/15 0600 11/25/15 0445 11/26/15 0515  NA 149* 149* 148* 146* 142 141 144  K 3.7 3.9 3.5 3.2* 3.5 3.6 3.4*  CL 119* 116* 113* 113* 111 110 114*  CO2 19* 19* 20* 21* 19* 19* 21*  GLUCOSE 124* 114* 115* 129* 129* 104* 108*  BUN 54* 43* 37* 36* 35* 33* 30*  CREATININE 2.23* 1.89* 1.80* 1.72* 1.55* 1.43* 1.32*  CALCIUM 8.9 9.1 8.8* 8.5* 8.2* 8.5* 8.3*  MG 2.8* 2.6* 2.4 2.3 2.1  --   --  PHOS 4.8* 3.8 3.9 3.4 2.6  --   --     CBC:  Recent Labs Lab 11/22/15 0550 11/23/15 0545 11/24/15 0600 11/25/15 0445 11/26/15 0515  WBC 11.3* 10.6* 12.8* 10.3 11.2*  HGB 10.4* 10.0* 9.6* 9.2* 9.0*  HCT 33.9* 32.1* 30.6* 30.0* 28.8*  MCV 85.6 84.9 83.8 84.7 83.7  PLT 497* 426* 438* 424* 393    Liver Function Tests:  Recent Labs Lab 11/20/15 0258 11/24/15 1344 11/26/15 0515  AST 189* 74* 29  ALT 238* 224* 110*  ALKPHOS 225* 194* 157*  BILITOT 0.7 0.1* 0.6  PROT 7.4 5.9* 5.3*  ALBUMIN 3.0* 2.5* 2.4*   Cardiac  Enzymes:  Recent Labs Lab 11/20/15 1122 11/20/15 1947 11/20/15 2208  TROPONINI 0.09* 0.07* 0.06*    CBG:  Recent Labs Lab 11/25/15 1654 11/25/15 2004 11/26/15 0009 11/26/15 0318 11/26/15 0821  GLUCAP 121* 108* 113* 121* 95    Recent Results (from the past 240 hour(s))  Blood Culture (routine x 2)     Status: None   Collection Time: 11/19/15 10:27 AM  Result Value Ref Range Status   Specimen Description BLOOD LEFT ANTECUBITAL  Final   Special Requests BOTTLES DRAWN AEROBIC AND ANAEROBIC 5CC  Final   Culture NO GROWTH 5 DAYS  Final   Report Status 11/24/2015 FINAL  Final  Urine culture     Status: None   Collection Time: 11/19/15 10:31 AM  Result Value Ref Range Status   Specimen Description URINE, CATHETERIZED  Final   Special Requests NONE  Final   Culture NO GROWTH 1 DAY  Final   Report Status 11/20/2015 FINAL  Final  Blood Culture (routine x 2)     Status: None   Collection Time: 11/19/15 10:35 AM  Result Value Ref Range Status   Specimen Description BLOOD RIGHT ANTECUBITAL  Final   Special Requests BOTTLES DRAWN AEROBIC AND ANAEROBIC 5CC  Final   Culture NO GROWTH 5 DAYS  Final   Report Status 11/24/2015 FINAL  Final  Fungus Culture with Smear     Status: None (Preliminary result)   Collection Time: 11/20/15 12:14 PM  Result Value Ref Range Status   Specimen Description CSF  Final   Special Requests NONE  Final   Fungal Smear   Final    NO YEAST OR FUNGAL ELEMENTS SEEN Performed at [facility]    Culture   Final    CULTURE IN PROGRESS FOR FOUR WEEKS Performed at [facility]    Report Status PENDING  Incomplete  CSF culture     Status: None   Collection Time: 11/20/15 12:14 PM  Result Value Ref Range Status   Specimen Description CSF  Final   Special Requests NONE  Final   Gram Stain   Final    FEW WBC PRESENT,BOTH PMN AND MONONUCLEAR NO ORGANISMS SEEN    Culture NO GROWTH 3 DAYS  Final   Report Status 11/23/2015 FINAL  Final       Studies:   Recent x-ray studies have been reviewed in detail by the Attending Physician  Scheduled Meds:  Scheduled Meds: . amiodarone  200 mg Per NG tube Daily  . antiseptic oral rinse  7 mL Mouth Rinse q12n4p  . ceFEPime (MAXIPIME) IV  2 g Intravenous Q12H  . chlorhexidine  15 mL Mouth Rinse BID  . free water  200 mL Per Tube 3 times per day  . insulin aspart  0-15 Units Subcutaneous 6 times per day  .  pantoprazole (PROTONIX) IV  40 mg Intravenous QHS  . vancomycin  1,000 mg Intravenous Q24H    Time spent on care of this patient: 35 mins   MCCLUNG,JEFFREY T , MD   [facility] Office  [phone] Pager - Text Page per Loretha Stapler as per below:  On-Call/Text Page:      [email or web address]      password [email or web address]  If 7PM-7AM, please contact night-coverage [email or web address] Password [email or web address] 11/26/2015, 10:32 AM   LOS: 7 days

## 2015-11-27 LAB — COMPREHENSIVE METABOLIC PANEL
ALBUMIN: 2.6 g/dL — AB (ref 3.5–5.0)
ALT: 93 U/L — AB (ref 17–63)
AST: 25 U/L (ref 15–41)
Alkaline Phosphatase: 155 U/L — ABNORMAL HIGH (ref 38–126)
Anion gap: 10 (ref 5–15)
BUN: 28 mg/dL — AB (ref 6–20)
CHLORIDE: 112 mmol/L — AB (ref 101–111)
CO2: 19 mmol/L — AB (ref 22–32)
CREATININE: 1.23 mg/dL (ref 0.61–1.24)
Calcium: 8.6 mg/dL — ABNORMAL LOW (ref 8.9–10.3)
GFR calc Af Amer: >60 mL/min (ref >60)
GFR calc non Af Amer: 57 mL/min — ABNORMAL LOW (ref >60)
GLUCOSE: 113 mg/dL — AB (ref 65–99)
POTASSIUM: 4 mmol/L (ref 3.5–5.1)
Sodium: 141 mmol/L (ref 135–145)
Total Bilirubin: 0.5 mg/dL (ref 0.3–1.2)
Total Protein: 5.8 g/dL — ABNORMAL LOW (ref 6.5–8.1)

## 2015-11-27 LAB — GLUCOSE, CAPILLARY
GLUCOSE-CAPILLARY: 110 mg/dL — AB (ref 65–99)
GLUCOSE-CAPILLARY: 103 mg/dL — AB (ref 65–99)
Glucose-Capillary: 97 mg/dL (ref 65–99)
Glucose-Capillary: 136 mg/dL — ABNORMAL HIGH (ref 65–99)
Glucose-Capillary: 118 mg/dL — ABNORMAL HIGH (ref 65–99)

## 2015-11-27 LAB — CBC
HEMATOCRIT: 30.5 % — AB (ref 39.0–52.0)
Hemoglobin: 9.5 g/dL — ABNORMAL LOW (ref 13.0–17.0)
MCH: 26 pg (ref 26.0–34.0)
MCHC: 31.1 g/dL (ref 30.0–36.0)
MCV: 83.3 fL (ref 78.0–100.0)
PLATELETS: 403 10*3/uL — AB (ref 150–400)
RBC: 3.66 MIL/uL — ABNORMAL LOW (ref 4.22–5.81)
RDW: 15.9 % — AB (ref 11.5–15.5)
WBC: 11.9 10*3/uL — ABNORMAL HIGH (ref 4.0–10.5)

## 2015-11-27 LAB — VANCOMYCIN, TROUGH: Vancomycin Tr: 15 ug/mL (ref 10.0–20.0)

## 2015-11-27 NOTE — Progress Notes (Signed)
Pharmacy Antibiotic Note  Jordan [name] is a 70 y.o. male admitted on 11/19/2015 with meningitis.  Pharmacy has been consulted for vancomycin and cefepime dosing.  Plan: Vancomycin 1g IV every 24 hours.  Goal trough 15-20 mcg/mL. This patient's current antibiotics will be continued without adjustments. Cefepime 2g IV q12h  Monitor culture data, renal function and clinical course VT at SS prn  Height:  (175.3 cm) Weight: 196 lb 3.4 oz (89 kg) IBW/kg (Calculated) : 70.7  Temp (24hrs), Avg:98 F (36.7 C), Min:97.5 F (36.4 C), Max:98.3 F (36.8 C)   Recent Labs Lab 11/21/15 0345  11/23/15 0545 11/23/15 1045 11/24/15 0600 11/25/15 0445 11/26/15 0515 11/27/15 0312 11/27/15 1049  WBC  --   < > 10.6*  --  12.8* 10.3 11.2* 11.9*  --   CREATININE  --   < > 1.72*  --  1.55* 1.43* 1.32* 1.23  --   LATICACIDVEN 1.7  --   --   --   --   --   --   --   --   VANCOTROUGH  --   --   --  17  --   --   --   --  15  < > = values in this interval not displayed.  Estimated Creatinine Clearance: 60.8 mL/min (by C-G formula based on Cr of 1.23).    No Known Allergies  Antimicrobials this admission: Vanc 1/29 >>  Cefepime 1/29 >>   Dose adjustments this admission: 2/2 VT: 17 on 1g q24h 2/6 VT: 15 on 1g q24h  Microbiology results: 1/29 BCx: ng 1/29 UCx: ng  1/30 CSF: ng  Sputum:   MRSA PCR: neg  Arlean Hopping. Newman Pies, PharmD, BCPS Clinical Pharmacist Pager [phone] 11/27/2015 12:03 PM

## 2015-11-27 NOTE — Progress Notes (Signed)
Subjective: No changes  Exam: Filed Vitals:   11/27/15 0420 11/27/15 0800  BP: 136/95   Pulse: 101   Temp:  98.2 F (36.8 C)  Resp: 23      Neuro: MS: opens eyes to voice. Does not clearly follow commands, but does move all extremities purposefully. Jordan [name] engages readily  CN: PERRL, fixates and tracks, Motor: Jordan [name] moves all extremities spontaneously,  some right-sided weakness Sensory: responds to nox stim x 4.     Pertinent Labs: none  Felicie Morn PA-C [facility] [phone]  Impression: Likely represents a bacterial meningitis. Jordan [name] did receive cefepime about 18 hours prior to the CSF sample and therefore I would not be surprised that it would be sterilized. Jordan [name] has been started on antibiotics.     Recommendations: 1) antibiotics per ID.  2) neurosurgery following - consider possible VP shunt 3) Neurology S/O   11/27/2015, 9:13 AM

## 2015-11-27 NOTE — Progress Notes (Signed)
Patients daughter Lacretia Nicks called, would like to be included in the pallative team meeting. Phone number updated in the chart.

## 2015-11-27 NOTE — Progress Notes (Signed)
Subjective:  Patient still encephalopathic, does not respond to verbal stimuli   Antibiotics:  Anti-infectives    Start     Dose/Rate Route Frequency Ordered Stop   11/20/15 2200  ceFEPIme (MAXIPIME) 2 g in dextrose 5 % 50 mL IVPB     2 g 100 mL/hr over 30 Minutes Intravenous Every 12 hours 11/20/15 1030     11/20/15 1200  ampicillin (OMNIPEN) 2 g in sodium chloride 0.9 % 50 mL IVPB  Status:  Discontinued     2 g 150 mL/hr over 20 Minutes Intravenous 4 times per day 11/20/15 1030 11/22/15 1230   11/20/15 1100  vancomycin (VANCOCIN) IVPB 1000 mg/200 mL premix     1,000 mg 200 mL/hr over 60 Minutes Intravenous Every 24 hours 11/19/15 1307     11/19/15 1100  vancomycin (VANCOCIN) 2,000 mg in sodium chloride 0.9 % 500 mL IVPB     2,000 mg 250 mL/hr over 120 Minutes Intravenous  Once 11/19/15 1027 11/19/15 1303   11/19/15 1100  ceFEPIme (MAXIPIME) 2 g in dextrose 5 % 50 mL IVPB  Status:  Discontinued     2 g 100 mL/hr over 30 Minutes Intravenous Every 24 hours 11/19/15 1027 11/20/15 1030      Medications: Scheduled Meds: . amiodarone  200 mg Per NG tube Daily  . antiseptic oral rinse  7 mL Mouth Rinse q12n4p  . ceFEPime (MAXIPIME) IV  2 g Intravenous Q12H  . chlorhexidine  15 mL Mouth Rinse BID  . free water  200 mL Per Tube 3 times per day  . insulin aspart  0-15 Units Subcutaneous 6 times per day  . pantoprazole (PROTONIX) IV  40 mg Intravenous QHS  . vancomycin  1,000 mg Intravenous Q24H    Objective: Weight change: -10.6 oz (-0.3 kg)  Intake/Output Summary (Last 24 hours) at 11/27/15 1453 Last data filed at 11/27/15 1200  Gross per 24 hour  Intake   2725 ml  Output    350 ml  Net   2375 ml   Blood pressure 150/97, pulse 103, temperature 98.1 F (36.7 C), temperature source Axillary, resp. rate 30, height 5\' 9"  (1.753 m), weight 196 lb 3.4 oz (89 kg), SpO2 100 %. Temp:  [97.5 F (36.4 C)-98.3 F (36.8 C)] 98.1 F (36.7 C) (02/06 1300) Pulse Rate:   [99-103] 103 (02/06 1206) Resp:  [15-30] 30 (02/06 1206) BP: (124-150)/(85-114) 150/97 mmHg (02/06 1206) SpO2:  [90 %-100 %] 100 % (02/06 1206) Weight:  [196 lb 3.4 oz (89 kg)] 196 lb 3.4 oz (89 kg) (02/06 0442)  Physical Exam: General: sleeping, moves away when trying to open eyelids HEENT:EOMI CVS regular rate, normal r,  no murmur rubs or gallops Chest: clear to auscultation bilaterally, no wheezing, rales or rhonchi Abdomen: soft  nondistended, Extremities: no  clubbing or edema noted bilaterally Skin: no rashes Neuro: slight facial drop to right but difficult to assess CBC: CBC Latest Ref Rng 11/27/2015 11/26/2015 11/25/2015  WBC 4.0 - 10.5 K/uL 11.9(H) 11.2(H) 10.3  Hemoglobin 13.0 - 17.0 g/dL 1.6(X) 0.9(U) 0.4(V)  Hematocrit 39.0 - 52.0 % 30.5(L) 28.8(L) 30.0(L)  Platelets 150 - 400 K/uL 403(H) 393 424(H)       BMET  Recent Labs  11/26/15 0515 11/27/15 0312  NA 144 141  K 3.4* 4.0  CL 114* 112*  CO2 21* 19*  GLUCOSE 108* 113*  BUN 30* 28*  CREATININE 1.32* 1.23  CALCIUM 8.3* 8.6*  Liver Panel   Recent Labs  11/26/15 0515 11/27/15 0312  PROT 5.3* 5.8*  ALBUMIN 2.4* 2.6*  AST 29 25  ALT 110* 93*  ALKPHOS 157* 155*  BILITOT 0.6 0.5     Micro Results: Recent Results (from the past 720 hour(s))  Urine culture     Status: None   Collection Time: 11/05/15  7:51 AM  Result Value Ref Range Status   Specimen Description URINE, CATHETERIZED  Final   Special Requests NONE  Final   Culture NO GROWTH 1 DAY  Final   Report Status 11/06/2015 FINAL  Final  Blood culture (routine x 2)     Status: None   Collection Time: 11/05/15  8:40 AM  Result Value Ref Range Status   Specimen Description BLOOD RIGHT ANTECUBITAL  Final   Special Requests BOTTLES DRAWN AEROBIC AND ANAEROBIC 5CC  Final   Culture NO GROWTH 5 DAYS  Final   Report Status 11/10/2015 FINAL  Final  Blood culture (routine x 2)     Status: None   Collection Time: 11/05/15  8:48 AM  Result Value  Ref Range Status   Specimen Description BLOOD LEFT ANTECUBITAL  Final   Special Requests BOTTLES DRAWN AEROBIC AND ANAEROBIC 5CC  Final   Culture NO GROWTH 5 DAYS  Final   Report Status 11/10/2015 FINAL  Final  MRSA PCR Screening     Status: None   Collection Time: 11/05/15 11:18 AM  Result Value Ref Range Status   MRSA by PCR NEGATIVE NEGATIVE Final    Comment:        The GeneXpert MRSA Assay (FDA approved for NASAL specimens only), is one component of a comprehensive MRSA colonization surveillance program. It is not intended to diagnose MRSA infection nor to guide or monitor treatment for MRSA infections.   Culture, Urine     Status: None   Collection Time: 11/12/15 12:07 AM  Result Value Ref Range Status   Specimen Description URINE, RANDOM  Final   Special Requests none Normal  Final   Culture >=100,000 COLONIES/mL ESCHERICHIA COLI  Final   Report Status 11/14/2015 FINAL  Final   Organism ID, Bacteria ESCHERICHIA COLI  Final      Susceptibility   Escherichia coli - MIC*    AMPICILLIN >=32 RESISTANT Resistant     CEFAZOLIN <=4 SENSITIVE Sensitive     CEFTRIAXONE <=1 SENSITIVE Sensitive     CIPROFLOXACIN 1 SENSITIVE Sensitive     GENTAMICIN <=1 SENSITIVE Sensitive     IMIPENEM <=0.25 SENSITIVE Sensitive     NITROFURANTOIN <=16 SENSITIVE Sensitive     TRIMETH/SULFA >=320 RESISTANT Resistant     AMPICILLIN/SULBACTAM 16 INTERMEDIATE Intermediate     PIP/TAZO <=4 SENSITIVE Sensitive     * >=100,000 COLONIES/mL ESCHERICHIA COLI  Blood Culture (routine x 2)     Status: None   Collection Time: 11/19/15 10:27 AM  Result Value Ref Range Status   Specimen Description BLOOD LEFT ANTECUBITAL  Final   Special Requests BOTTLES DRAWN AEROBIC AND ANAEROBIC 5CC  Final   Culture NO GROWTH 5 DAYS  Final   Report Status 11/24/2015 FINAL  Final  Urine culture     Status: None   Collection Time: 11/19/15 10:31 AM  Result Value Ref Range Status   Specimen Description URINE, CATHETERIZED   Final   Special Requests NONE  Final   Culture NO GROWTH 1 DAY  Final   Report Status 11/20/2015 FINAL  Final  Blood Culture (routine x 2)  Status: None   Collection Time: 11/19/15 10:35 AM  Result Value Ref Range Status   Specimen Description BLOOD RIGHT ANTECUBITAL  Final   Special Requests BOTTLES DRAWN AEROBIC AND ANAEROBIC 5CC  Final   Culture NO GROWTH 5 DAYS  Final   Report Status 11/24/2015 FINAL  Final  Fungus Culture with Smear     Status: None (Preliminary result)   Collection Time: 11/20/15 12:14 PM  Result Value Ref Range Status   Specimen Description CSF  Final   Special Requests NONE  Final   Fungal Smear   Final    NO YEAST OR FUNGAL ELEMENTS SEEN Performed at [facility]    Culture   Final    CULTURE IN PROGRESS FOR FOUR WEEKS Performed at [facility]    Report Status PENDING  Incomplete  CSF culture     Status: None   Collection Time: 11/20/15 12:14 PM  Result Value Ref Range Status   Specimen Description CSF  Final   Special Requests NONE  Final   Gram Stain   Final    FEW WBC PRESENT,BOTH PMN AND MONONUCLEAR NO ORGANISMS SEEN    Culture NO GROWTH 3 DAYS  Final   Report Status 11/23/2015 FINAL  Final    Studies/Results: No results found.    Assessment/Plan:  INTERVAL HISTORY:     Active Problems:   Altered mental status   Obstructive hydrocephalus   Acute respiratory failure (HCC)   Bacterial meningitis   Encounter for central line placement   Meningitis    Jordan [name] is a 70 y.o. male with  Meningitis after EVD placment for hydrocephalus currently on vancomycin and cefepime with negative cultures though patient had received antecedent antibiotics  #1 Meningitis: Very little improvement despite 10 days of abtx. Discussed with Dr. Sharon Seller, appears to have poor prognosis due to little response to abtx treatment. Would support family discussion to goals of care/comfort care.   LOS: 8 days   Judyann Munson 11/27/2015, 2:53 PM

## 2015-11-27 NOTE — Care Management Note (Addendum)
Case Management Note  Patient Details  Name: REUVEN BRAVER MRN: [id number] Date of Birth: 04-Aug-1946  Subjective/Objective:    NCM spoke with POA , Mrs Hust who is the patient's wife but does not live with him, she states patient lives at [facility],, patient's son lives in [place]( who works) and a daughter who lives in [place] ( who works).  Palliative will contact them to schedule a meeting. Patient is  non communicative.  NCM will cont to follow for dc needs.                 Action/Plan:   Expected Discharge Date:                  Expected Discharge Plan:  Skilled Nursing Facility  In-House Referral:  Clinical Social Work  Discharge planning Services  CM Consult  Post Acute Care Choice:    Choice offered to:     DME Arranged:    DME Agency:     HH Arranged:    HH Agency:     Status of Service:  In process, will continue to follow  Medicare Important Message Given:    Date Medicare IM Given:    Medicare IM give by:    Date Additional Medicare IM Given:    Additional Medicare Important Message give by:     If discussed at Long Length of Stay Meetings, dates discussed:    Additional Comments:  Leone Haven, RN 11/27/2015, 7:25 PM

## 2015-11-27 NOTE — Progress Notes (Signed)
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 CSF cultures were negative as he was already on abx but had high cell count with high protein and low glucose. He had a ventric drain placed temporarily for question of hydrocephalus but he pulled it out. Repeat CT was unchanged. No plan to replace the drain as his ICPs were low.   HPI/Subjective: There have been no significant changes in the patients condition.  He remains noncommunicative.    Assessment/Plan:  Meningitis  Cont abx tx per ID for now   Altered mental status  Due to meningitis + recent ICH - no sign of significant improvement at this time - I am concerned that the patient may not improve further beyond his current status - I have communicated this to his wife/family at bedside - they are interested in a Palliative Care consult to assist in formulating specific goals of care   Hypoxemia Resolved with saturation 100% on room air  Recent LLL PNA No evidence of active pulmonary infection at present  OSA Continue nightly BiPAP until goals of care more clearly deliniated   HTN BP reasonably well controlled at this time  AFib  presently in sinus w/ a mild sinus tachycardia - no change in tx plan today   CAD - s/p CABG   AKI on CKD III baseline Scr ~1.7 - crt now better than reported baseline   Hyponatremia Resolved  Hypokalemia Replaced  Abnormal LFT's  Continue to trend down - approaching normal range   Hyperglycemia without a h/o diabetes Likely due to acute illness + tube feeds - follow  trend   Recent ICH in setting of coumadin  Code Status: DNR - NO CODE - BIPAP OK Family Communication: spoke w/ wife and other family in hall today at length Disposition Plan: SDU until goals of care established   Consultants: Neurology  NS PCCM ID  Antibiotics: Vanc 1/29 > Cefepime 1/29 > Ampicilln 1/30 > 2/1  DVT prophylaxis: SCDs  Objective: Blood pressure 143/114, pulse 101, temperature 98.2 F (36.8 C), temperature source Axillary, resp. rate 23, height 5\' 9"  (1.753 m), weight 89 kg (196 lb 3.4 oz), SpO2 100 %.  Intake/Output Summary (Last 24 hours) at 11/27/15 1119 Last data filed at 11/27/15 0905  Gross per 24 hour  Intake   2200 ml  Output    350 ml  Net   1850 ml   Exam: General: opens eyes to voice but does not follow examinter Lungs: Clear to auscultation bilaterally - no wheeze  Cardiovascular: Regular rhythm without murmur gallop or rub Abdomen: Nontender, overweight, soft, bowel sounds positive, no rebound, no ascites, no appreciable mass Extremities: No significant cyanosis, clubbing;  trace edema bilateral lower extremities w/o change    Data Reviewed: Basic Metabolic Panel:  Recent Labs Lab 11/21/15 0645 11/22/15 0550 11/23/15 0545 11/24/15 0600 11/25/15 0445 11/26/15 0515 11/27/15 0312  NA 149* 148* 146* 142 141 144 141  K 3.9 3.5 3.2* 3.5 3.6 3.4* 4.0  CL 116* 113* 113* 111 110 114* 112*  CO2 19* 20* 21* 19* 19* 21* 19*  GLUCOSE 114* 115* 129* 129* 104* 108* 113*  BUN 43* 37* 36* 35* 33* 30* 28*  CREATININE 1.89* 1.80* 1.72* 1.55* 1.43* 1.32* 1.23  CALCIUM 9.1 8.8* 8.5* 8.2* 8.5* 8.3* 8.6*  MG 2.6* 2.4 2.3 2.1  --   --   --   PHOS 3.8 3.9 3.4 2.6  --   --   --     CBC:  Recent Labs Lab 11/23/15 0545 11/24/15 0600 11/25/15 0445 11/26/15 0515 11/27/15 0312  WBC 10.6* 12.8* 10.3 11.2* 11.9*  HGB 10.0* 9.6* 9.2* 9.0* 9.5*  HCT 32.1* 30.6* 30.0* 28.8* 30.5*  MCV 84.9 83.8 84.7 83.7 83.3  PLT 426* 438* 424* 393 403*     Liver Function Tests:  Recent Labs Lab 11/24/15 1344 11/26/15 0515 11/27/15 0312  AST 74* 29 25  ALT 224* 110* 93*  ALKPHOS 194* 157* 155*  BILITOT 0.1* 0.6 0.5  PROT 5.9* 5.3* 5.8*  ALBUMIN 2.5* 2.4* 2.6*   Cardiac Enzymes:  Recent Labs Lab 11/20/15 1122 11/20/15 1947 11/20/15 2208  TROPONINI 0.09* 0.07* 0.06*    CBG:  Recent Labs Lab 11/26/15 1455 11/26/15 1954 11/26/15 2309 11/27/15 0426 11/27/15 0750  GLUCAP 109* 109* 87 97 110*    Recent Results (from the past 240 hour(s))  Blood Culture (routine x 2)     Status: None   Collection Time: 11/19/15 10:27 AM  Result Value Ref Range Status   Specimen Description BLOOD LEFT ANTECUBITAL  Final   Special Requests BOTTLES DRAWN AEROBIC AND ANAEROBIC 5CC  Final   Culture NO GROWTH 5 DAYS  Final   Report Status 11/24/2015 FINAL  Final  Urine culture     Status: None   Collection Time: 11/19/15 10:31 AM  Result Value Ref Range Status   Specimen Description URINE, CATHETERIZED  Final   Special Requests NONE  Final   Culture NO GROWTH 1 DAY  Final   Report Status 11/20/2015 FINAL  Final  Blood Culture (routine x 2)     Status: None   Collection Time: 11/19/15 10:35 AM  Result Value Ref Range Status   Specimen Description BLOOD RIGHT ANTECUBITAL  Final   Special Requests BOTTLES DRAWN AEROBIC AND ANAEROBIC 5CC  Final   Culture NO GROWTH 5 DAYS  Final   Report Status 11/24/2015 FINAL  Final  Fungus Culture with Smear     Status: None (Preliminary result)   Collection Time: 11/20/15 12:14 PM  Result Value Ref Range Status   Specimen Description CSF  Final   Special Requests NONE  Final   Fungal Smear   Final    NO YEAST OR FUNGAL ELEMENTS SEEN Performed at [facility]    Culture   Final    CULTURE IN PROGRESS FOR FOUR WEEKS Performed at [facility]    Report Status PENDING  Incomplete  CSF culture     Status: None   Collection Time: 11/20/15 12:14 PM  Result Value Ref Range Status    Specimen Description CSF  Final   Special Requests NONE  Final   Gram Stain   Final    FEW WBC PRESENT,BOTH PMN AND MONONUCLEAR NO ORGANISMS SEEN    Culture NO GROWTH 3 DAYS  Final   Report Status 11/23/2015 FINAL  Final     Studies:   Recent x-ray studies have been reviewed in detail by the Attending Physician  Scheduled Meds:  Scheduled Meds: . amiodarone  200 mg Per NG tube Daily  . antiseptic  oral rinse  7 mL Mouth Rinse q12n4p  . ceFEPime (MAXIPIME) IV  2 g Intravenous Q12H  . chlorhexidine  15 mL Mouth Rinse BID  . free water  200 mL Per Tube 3 times per day  . insulin aspart  0-15 Units Subcutaneous 6 times per day  . pantoprazole (PROTONIX) IV  40 mg Intravenous QHS  . vancomycin  1,000 mg Intravenous Q24H    Time spent on care of this patient: 35 mins   Margaruite Top T , MD   [facility] Office  [phone] Pager - Text Page per Loretha Stapler as per below:  On-Call/Text Page:      [email or web address]      password [email or web address]  If 7PM-7AM, please contact night-coverage [email or web address] Password [email or web address] 11/27/2015, 11:19 AM   LOS: 8 days

## 2015-11-28 DIAGNOSIS — I2510 Atherosclerotic heart disease of native coronary artery without angina pectoris: Secondary | ICD-10-CM

## 2015-11-28 DIAGNOSIS — Z515 Encounter for palliative care: Secondary | ICD-10-CM | POA: Diagnosis not present

## 2015-11-28 DIAGNOSIS — I629 Nontraumatic intracranial hemorrhage, unspecified: Secondary | ICD-10-CM | POA: Diagnosis present

## 2015-11-28 DIAGNOSIS — E876 Hypokalemia: Secondary | ICD-10-CM | POA: Diagnosis present

## 2015-11-28 DIAGNOSIS — G4733 Obstructive sleep apnea (adult) (pediatric): Secondary | ICD-10-CM | POA: Diagnosis present

## 2015-11-28 DIAGNOSIS — R4020 Unspecified coma: Secondary | ICD-10-CM

## 2015-11-28 DIAGNOSIS — E871 Hypo-osmolality and hyponatremia: Secondary | ICD-10-CM

## 2015-11-28 LAB — GLUCOSE, CAPILLARY
GLUCOSE-CAPILLARY: 108 mg/dL — AB (ref 65–99)
GLUCOSE-CAPILLARY: 127 mg/dL — AB (ref 65–99)
Glucose-Capillary: 98 mg/dL (ref 65–99)
Glucose-Capillary: 94 mg/dL (ref 65–99)
Glucose-Capillary: 113 mg/dL — ABNORMAL HIGH (ref 65–99)

## 2015-11-28 MED ORDER — MORPHINE SULFATE (PF) 2 MG/ML IV SOLN: 2.0000 mg | INTRAVENOUS | Status: AC | PRN

## 2015-11-28 MED ORDER — GLYCOPYRROLATE 0.2 MG/ML IJ SOLN
0.2000 mg | INTRAMUSCULAR | Status: DC | PRN
  Filled 2015-11-28: qty 1

## 2015-11-28 MED ORDER — HALOPERIDOL LACTATE 5 MG/ML IJ SOLN: 1.0000 mg | Freq: Four times a day (QID) | INTRAMUSCULAR | Status: DC | PRN

## 2015-11-28 MED ORDER — ONDANSETRON HCL 4 MG/2ML IJ SOLN: 4.0000 mg | Freq: Four times a day (QID) | INTRAMUSCULAR | Status: DC | PRN

## 2015-11-28 MED ORDER — MORPHINE SULFATE (PF) 2 MG/ML IV SOLN: 2.0000 mg | INTRAVENOUS | Status: DC | PRN

## 2015-11-28 MED ORDER — LORAZEPAM 2 MG/ML IJ SOLN: 1.0000 mg | INTRAMUSCULAR | Status: AC | PRN

## 2015-11-28 MED ORDER — MORPHINE SULFATE (PF) 2 MG/ML IV SOLN
2.0000 mg | INTRAVENOUS | Status: DC
  Administered 2015-11-28 – 2015-11-30 (×10): 2 mg via INTRAVENOUS
  Filled 2015-11-28 (×10): qty 1

## 2015-11-28 MED ORDER — ONDANSETRON 4 MG PO TBDP
4.0000 mg | ORAL_TABLET | Freq: Four times a day (QID) | ORAL | Status: DC | PRN
  Filled 2015-11-28: qty 1

## 2015-11-28 MED ORDER — MORPHINE SULFATE (CONCENTRATE) 10 MG/0.5ML PO SOLN: 5.0000 mg | ORAL | Status: DC | PRN

## 2015-11-28 MED ORDER — LORAZEPAM 2 MG/ML IJ SOLN: 1.0000 mg | INTRAMUSCULAR | Status: DC | PRN

## 2015-11-28 MED ORDER — GLYCOPYRROLATE 0.2 MG/ML IJ SOLN
0.2000 mg | INTRAMUSCULAR | Status: DC | PRN
Start: 2015-11-28 — End: 2015-11-30
  Filled 2015-11-28: qty 1

## 2015-11-28 MED ORDER — MORPHINE SULFATE (CONCENTRATE) 10 MG/0.5ML PO SOLN: 5.0000 mg | ORAL | Status: AC | PRN

## 2015-11-28 MED ORDER — ACETAMINOPHEN 325 MG PO TABS: 650.0000 mg | ORAL_TABLET | Freq: Four times a day (QID) | ORAL | Status: DC | PRN

## 2015-11-28 MED ORDER — SODIUM CHLORIDE 0.9% FLUSH: 3.0000 mL | INTRAVENOUS | Status: DC | PRN

## 2015-11-28 MED ORDER — ACETAMINOPHEN 650 MG RE SUPP: 650.0000 mg | Freq: Four times a day (QID) | RECTAL | Status: DC | PRN

## 2015-11-28 MED ORDER — SODIUM CHLORIDE 0.9% FLUSH
3.0000 mL | Freq: Two times a day (BID) | INTRAVENOUS | Status: DC
  Administered 2015-11-29: 3 mL via INTRAVENOUS

## 2015-11-28 MED ORDER — GLYCOPYRROLATE 1 MG PO TABS
1.0000 mg | ORAL_TABLET | ORAL | Status: DC | PRN
  Filled 2015-11-28: qty 1

## 2015-11-28 MED ORDER — SODIUM CHLORIDE 0.9 % IV SOLN: 250.0000 mL | INTRAVENOUS | Status: DC | PRN

## 2015-11-28 NOTE — Consult Note (Signed)
Consultation Note Date: 11/28/2015   Patient Name: Jordan [name]  DOB: Dec 21, 1945  MRN: [id number]  Age / Sex: 70 y.o., male  PCP: Jonathon Jordan, MD Referring Physician: Allie Bossier, MD  Reason for Consultation: Establishing goals of care, Hospice Evaluation, Non pain symptom management, Pain control and Terminal Care    Clinical Assessment/Narrative: 70 yo male who suffered an NSTEMI in December and was put on warfarin for afib.  Then unfortunately in January developed in intraventricular cerebral hemorrhage.  He was treated and a drain was placed.  The patient subsequently pulled the drain out.  A second drain was placed which the patient removed as well.  Shortly thereafter the patient developed meningitis.  He has had 10+ days of aggressive treatment with antibiotics and aggressive care.  Unfortunately his body has not responded well.  On my exam he is not conscious, he is diaphoretic, moaning, and slightly agitated with an N/G in place for tube feeds and medications.  I met with his son Loa Socks), his daughter Vanita Ingles) and his wife [name]).  It is evident that they are exhausted with worry about the patient.  [name] presented the patient's advanced directives.  These clearly indicate that he does not want to be kept alive if he is unresponsive.  He does not want artificial feeding.  He clearly indicates that he wants to be made comfortable and die with dignity.  The patient's wife, son and daughter agree that they do not want him to suffer any longer - he would not want to live this way - they want him made comfortable.  We discussed a transition to comfort care and residential hospice.  [name] lives in [place] and has had good experience with [facility].  Vera lives in [place], so [facility] may be convenient as well.  Contacts/Participants in Discussion: wife (Separated, but still HCPOA) Linda, son and  daughter Primary Decision Maker: [name]    SUMMARY OF RECOMMENDATIONS  Code Status/Advance Care Planning: DNR    Code Status Orders        Start     Ordered   11/28/15 1637  Do not attempt resuscitation (DNR)   Continuous    Question Answer Comment  In the event of cardiac or respiratory ARREST Do not call a "code blue"   In the event of cardiac or respiratory ARREST Do not perform Intubation, CPR, defibrillation or ACLS   In the event of cardiac or respiratory ARREST Use medication by any route, position, wound care, and other measures to relive pain and suffering. May use oxygen, suction and manual treatment of airway obstruction as needed for comfort.      11/28/15 1637    Code Status History    Date Active Date Inactive Code Status Order ID Comments User Context   11/28/2015  4:22 PM 11/28/2015  4:37 PM DNR [id number]  Melton Alar, PA-C Inpatient   11/22/2015 12:31 PM 11/28/2015  4:22 PM Partial Code [id number]  Marshell Garfinkel, MD Inpatient   11/22/2015 12:29 PM 11/22/2015 12:31 PM Partial Code [id number]  Marshell Garfinkel, MD Inpatient   11/20/2015 11:12 AM 11/22/2015 12:29 PM Full Code [id number]  Greta Doom, MD Inpatient   11/19/2015  1:43 PM 11/20/2015 11:12 AM DNR [id number]  Marijean Heath, NP ED   11/19/2015  1:00 PM 11/19/2015  1:43 PM Full Code [id number]  Marijean Heath, NP ED   11/05/2015 10:47 AM 11/17/2015 10:54 PM Full Code [id number]  Addison Lank P  Leonel Ramsay, MD ED   10/13/2015  4:19 PM 10/25/2015  6:10 PM Full Code [id number]  John Giovanni, PA-C Inpatient   10/11/2015  1:52 PM 10/13/2015  4:19 PM Full Code [id number]  Belva Crome, MD Inpatient   10/09/2015  2:55 AM 10/11/2015  1:52 PM Full Code [id number]  Jacolyn Reedy, MD Inpatient   07/28/2015  6:45 PM 07/30/2015  2:05 PM Full Code [id number]  Oswald Hillock, MD Inpatient   05/05/2015  5:01 PM 05/06/2015  3:41 PM Full Code [id number]  Venetia Maxon Rama, MD Inpatient   12/04/2012  6:51 PM 12/05/2012 12:56 PM Full Code  [id number]  Charlie Pitter, MD Inpatient   01/24/2012  1:37 PM 01/24/2012  8:20 PM Full Code [id number]  Myrtie Hawk, RN Inpatient    Advance Directive Documentation        Most Recent Value   Type of Advance Directive  Healthcare Power of Attorney   Pre-existing out of facility DNR order (yellow form or pink MOST form)     "MOST" Form in Place?        Other Directives:Advanced Directive - paper copy placed on the chart  Symptom Management:   Pain:  Morphine  Anxiety:  Lorazepam  Delirium/Agitiation:  Haldol  Secretions:  Rubinol  Nausea:  Zofran  Psycho-social/Spiritual:  Support System: Strong   Prognosis: < 2 weeks.  The patient is non-responsive unable to take food or nutrition.  He has recently suffered brain trauma and severe acute infection.  Discharge Planning: Hospice facility   Chief Complaint/ Primary Diagnoses: Present on Admission:  . Altered mental status . Obstructive hydrocephalus . Acute respiratory failure ([place]) . Bacterial meningitis . Encounter for central line placement . Meningitis . OSA (obstructive sleep apnea) . CAD in native artery . Hemorrhage, intracranial ([place]) . Hyponatremia . Hypokalemia  I have reviewed the medical record, interviewed the patient and family, and examined the patient. The following aspects are pertinent.  Past Medical History  Diagnosis Date  . Hypertension   . Kidney stone   . Bipolar 1 disorder ([place])   . Diverticulitis   . Anxiety   . Arthritis   . Sleep apnea   . Chronic headaches   . Stage III chronic kidney disease     Baseline creatinine 1.8  . Left inguinal hernia   . Atherosclerosis   . CAD (coronary artery disease)     a. 10/11/2015: total occlusion of OM1 and LCx, 50% stenosis in distal LM, 80% stenosis in LAD, 99% stenosis in RCA --> CABG recommended.  . Hiatal hernia   . 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 Antritis (stomach)    Social History   Social History  . Marital Status: Married     Spouse Name: [name]  . Number of Children: 0  . Years of Education: N/A   Occupational History  . [employer] account executive    Social History Main Topics  . Smoking status: Former Smoker -- 1.50 packs/day for 20 years    Quit date: 10/22/1983  . Smokeless tobacco: Never Used  . Alcohol Use: No  . Drug Use: No  . Sexual Activity: Not Asked   Other Topics Concern  . None   Social History Narrative   Married.  Independent of ADLs.  Ambulates independently.   Family History  Problem Relation Age of Onset  . Anesthesia problems Neg Hx   . Heart attack Father   . Liver cancer Paternal Grandfather  unsure of other cancers   . Diabetes Sister    Scheduled Meds: . antiseptic oral rinse  7 mL Mouth Rinse q12n4p  . chlorhexidine  15 mL Mouth Rinse BID  . sodium chloride flush  3 mL Intravenous Q12H   Continuous Infusions:  PRN Meds:.sodium chloride, acetaminophen **OR** acetaminophen, glycopyrrolate **OR** glycopyrrolate **OR** glycopyrrolate, haloperidol lactate, LORazepam, morphine injection, morphine CONCENTRATE **OR** morphine CONCENTRATE, ondansetron **OR** ondansetron (ZOFRAN) IV, sodium chloride flush Medications Prior to Admission:  Prior to Admission medications   Medication Sig Start Date End Date Taking? Authorizing Provider  amiodarone (PACERONE) 200 MG tablet Take 1 tablet (200 mg total) by mouth daily. 10/25/15  Yes Donielle Liston Alba, PA-C  atorvastatin (LIPITOR) 10 MG tablet Take 1 tablet (10 mg total) by mouth daily at 6 PM. 11/17/15  Yes Donzetta Starch, NP  ferrous sulfate 325 (65 FE) MG tablet Take 1 tablet (325 mg total) by mouth daily with breakfast. For one month then stop. 10/25/15  Yes Donielle Liston Alba, PA-C  furosemide (LASIX) 40 MG tablet Take 1 tablet (40 mg total) by mouth daily. 10/25/15  Yes Donielle Liston Alba, PA-C  senna-docusate (SENOKOT-S) 8.6-50 MG tablet Take 1 tablet by mouth 2 (two) times daily. 11/17/15  Yes Donzetta Starch, NP  ciprofloxacin  (CIPRO) 500 MG tablet Take 1 tablet (500 mg total) by mouth 2 (two) times daily. 11/18/15   Donzetta Starch, NP  LORazepam (ATIVAN) 2 MG/ML injection Inject 0.5 mLs (1 mg total) into the vein every 4 (four) hours as needed for anxiety, seizure or sedation. 11/28/15   Allie Bossier, MD  morphine 2 MG/ML injection Inject 1 mL (2 mg total) into the vein every 2 (two) hours as needed (DYSPNEA). 11/28/15   Allie Bossier, MD  Morphine Sulfate (MORPHINE CONCENTRATE) 10 MG/0.5ML SOLN concentrated solution Take 0.25 mLs (5 mg total) by mouth every 2 (two) hours as needed for moderate pain (or dyspnea). 11/28/15   Allie Bossier, MD   No Known Allergies  Review of Systems;  Patient non-responsive  Physical Exam  Well developed male,  Unconscious, moaning and shifting with agitation.  N/G in place CV;  S1S2  Respirations:  Mild to moderately increased work of breathing   Vital Signs: BP 129/89 mmHg  Pulse 104  Temp(Src) 98.4 F (36.9 C) (Axillary)  Resp 34  Ht 5' 9" (1.753 m)  Wt 89.5 kg (197 lb 5 oz)  BMI 29.12 kg/m2  SpO2 99%  SpO2: SpO2: 99 % O2 Device:SpO2: 99 % O2 Flow Rate: .O2 Flow Rate (L/min): 8 L/min  IO: Intake/output summary:  Intake/Output Summary (Last 24 hours) at 11/28/15 1926 Last data filed at 11/28/15 1553  Gross per 24 hour  Intake    780 ml  Output    350 ml  Net    430 ml    LBM: Last BM Date: 11/27/15 Baseline Weight: Weight: 86.183 kg (190 lb) Most recent weight: Weight: 89.5 kg (197 lb 5 oz)      Palliative Assessment/Data:  Flowsheet Rows        Most Recent Value   Intake Tab    Referral Department  Hospitalist   Unit at Time of Referral  Intermediate Care Unit   Palliative Care Primary Diagnosis  Neurology   Date Notified  11/27/15   Palliative Care Type  New Palliative care   Reason for referral  Clarify Goals of Care, Counsel Regarding Hospice   Date of Admission  11/19/15  Date first seen by Palliative Care  11/28/15   # of days Palliative referral  response time  1 Day(s)   # of days IP prior to Palliative referral  8   Clinical Assessment    Psychosocial & Spiritual Assessment    Palliative Care Outcomes       Additional Data Reviewed:  CBC:    Component Value Date/Time   WBC 11.9* 11/27/2015 0312   HGB 9.5* 11/27/2015 0312   HCT 30.5* 11/27/2015 0312   PLT 403* 11/27/2015 0312   MCV 83.3 11/27/2015 0312   NEUTROABS 21.0* 11/19/2015 1027   LYMPHSABS 1.9 11/19/2015 1027   MONOABS 1.0 11/19/2015 1027   EOSABS 0.0 11/19/2015 1027   BASOSABS 0.0 11/19/2015 1027   Comprehensive Metabolic Panel:    Component Value Date/Time   NA 141 11/27/2015 0312   K 4.0 11/27/2015 0312   CL 112* 11/27/2015 0312   CO2 19* 11/27/2015 0312   BUN 28* 11/27/2015 0312   CREATININE 1.23 11/27/2015 0312   CREATININE 1.96* 11/28/2012 1233   GLUCOSE 113* 11/27/2015 0312   CALCIUM 8.6* 11/27/2015 0312   AST 25 11/27/2015 0312   ALT 93* 11/27/2015 0312   ALKPHOS 155* 11/27/2015 0312   BILITOT 0.5 11/27/2015 0312   PROT 5.8* 11/27/2015 0312   ALBUMIN 2.6* 11/27/2015 0312     Time In: 3:00 Time Out: 4:10 Time Total: 70 min Greater than 50%  of this time was spent counseling and coordinating care related to the above assessment and plan.  Signed by: Imogene Burn, PA-C Palliative Medicine Pager: [phone]  11/28/2015, 7:26 PM  Please contact Palliative Medicine Team phone at [phone] for questions and concerns.

## 2015-11-28 NOTE — Care Management Note (Signed)
Case Management Note  Patient Details  Name: Jordan [name] MRN: [id number] Date of Birth: 10/24/1945  Subjective/Objective:   Palliative just met with family, states family wants [facility], [place] referral for residential.                 Action/Plan:   Expected Discharge Date:                  Expected Discharge Plan:  [facility]  In-House Referral:  Clinical Social Work  Discharge planning Services  CM Consult  Post Acute Care Choice:    Choice offered to:     DME Arranged:    DME Agency:     HH Arranged:    [address] Agency:     Status of Service:  Completed, signed off  Medicare Important Message Given:    Date Medicare IM Given:    Medicare IM give by:    Date Additional Medicare IM Given:    Additional Medicare Important Message give by:     If discussed at [place] of Stay Meetings, dates discussed:    Additional Comments:  Zenon Mayo, RN 11/28/2015, 4:11 PM

## 2015-11-28 NOTE — Progress Notes (Signed)
   11/28/15 1700  Clinical Encounter Type  Visited With Family  Visit Type Patient actively dying  Referral From Palliative care team  Spiritual Encounters  Spiritual Needs Emotional;Grief support  [facility] responded to consult and met with family offering whatever support they require throughout the night.  [place] available to support - call on-call pager [phone]. 5:16 PM Gwynn Burly

## 2015-11-28 NOTE — Clinical Social Work Note (Addendum)
Clinical Social Worker continuing to follow patient and family for support and discharge planning needs. Chart reviewed and a Palliative meeting will be scheduled with family. CSW will assist as needed and facilitate d/c plan when medically stable.  CSW informed late afternoon that family has chosen [facility]. Call made to Forrestine Him, BP social worker and they have no beds available at this time. CSW then meet with patient's wife and other family members to provided BP update and request a 2nd choice. Wife chose [facility]. Call made to nurse liasion, Jacklynn Ganong and referral made. Lafonda Mosses will f/u with CSW on Wednesday regarding patient and bed availability.    Genelle Bal, MSW, LCSW Licensed Clinical Social Worker Clinical Social Work Department [employer] [facility] [phone]

## 2015-11-28 NOTE — Discharge Summary (Signed)
Physician Discharge Summary  Jordan [name] [facility] OEH:[id number] DOB: Jul 25, 1946 DOA: 11/19/2015  PCP: Jordan Coma, MD  Admit date: 11/19/2015 Discharge date: 11/28/2015  Time spent: 35 minutes  Recommendations for Outpatient Follow-up: Meningitis  -Cont abx tx per ID for now  -Spoke at length with Dr. Carlyle [name] ID, concerning the treatment regimen of patient. She assured me that patient's presentation was not consistent with Neisseria Meningitis. Patient was not contagious. In addition I inquired into if patient would have been contagious to his family prior to admission (especially his pregnant daughter), I was assured that he was not contagious now or prior to admission. -I met with family to assure them on the answers I had received from our infectious disease physician. -After palliative care meeting family has decided on [facility]   Altered mental status  -Due to meningitis + recent ICH - no sign of significant improvement at this time  -After palliative care meeting family has decided on [facility]   Hypoxemia -Resolved with saturation 100% on room air  Recent LLL PNA -No evidence of active pulmonary infection at present  OSA -Continue nightly BiPAP until goals of care more clearly deliniated  -After palliative care meeting family has decided on [facility]   HTN -BP reasonably well controlled at this time  AFib  -Currently in NSR    CAD - s/p CABG   AKI on CKD III(Baseline Cr ~1.7) -After palliative care meeting family has decided on [facility]   Hyponatremia -Resolved  Hypokalemia -After palliative care meeting family has decided on [facility]   Abnormal LFT's  -After palliative care meeting family has decided on [facility]   Hyperglycemia without a h/o diabetes -After palliative care meeting family has decided on [facility]   Recent ICH in setting of coumadin    Discharge  Diagnoses:  Active Problems:   Altered mental status   Obstructive hydrocephalus   Acute respiratory failure (HCC)   Bacterial meningitis   Encounter for central line placement   Meningitis   OSA (obstructive sleep apnea)   CAD in native artery   Hemorrhage, intracranial (HCC)   Hyponatremia   Hypokalemia   Discharge Condition: Guarded  Diet recommendation: Nothing by mouth  Filed Weights   11/26/15 0604 11/27/15 0442 11/28/15 0500  Weight: 89.3 kg (196 lb 13.9 oz) 89 kg (196 lb 3.4 oz) 89.5 kg (197 lb 5 oz)    History of present illness:  [name] PMHx Bipolar 1 DO, Anxiety, HTN, Paroxysmal Atrial Fibrillation,NSTEMI Dec 2016 > CABG, CAD Native Artery, Diverticulitis, OSA, CKD stage III, Nephrolithiasis,  Recent large left parietal intraparenchymal hemorrhage (w/ resultant aphasia and R hemiparesis, who was readmitted on 1/29 with fever to 102 and altered mental status ultimately found to be due to Bacterial Meningitis. CSF cultures were negative as he was already on abx but had high cell count with high protein and low glucose. He had a ventric drain placed temporarily for question of hydrocephalus but he pulled it out. Repeat CT was unchanged. No plan to replace the drain as his ICPs were low.  -After palliative care meeting family has decided on [facility]    Consultations: Neurology  NS PCCM Dr. Carlyle [name] ID  Antibiotics Vanc 1/29 > Cefepime 1/29 > Ampicilln 1/30 > 2/1    Discharge Exam: Filed Vitals:   11/28/15 0500 11/28/15 0748 11/28/15 1356 11/28/15 1553  BP:  137/100 129/89   Pulse:  105 104   Temp:  98.2 F (36.8 C) 97.6 F (36.4 C) 98.4 F (36.9 C)  TempSrc:  Oral Oral Axillary  Resp:  25 34   Height:      Weight: 89.5 kg (197 lb 5 oz)     SpO2:  98% 99%     General: opens eyes to voice but does not follow examinter Lungs: Clear to auscultation bilaterally - no wheeze  Cardiovascular: Regular rhythm without murmur gallop or  rub Abdomen: Nontender, overweight, soft, bowel sounds positive, no rebound, no ascites, no appreciable mass Extremities: No significant cyanosis, clubbing; trace edema bilateral lower extremities w/o change   Discharge Instructions     Medication List    STOP taking these medications        amiodarone 200 MG tablet  Commonly known as:  PACERONE     atorvastatin 10 MG tablet  Commonly known as:  LIPITOR     ciprofloxacin 500 MG tablet  Commonly known as:  CIPRO     ferrous sulfate 325 (65 FE) MG tablet     furosemide 40 MG tablet  Commonly known as:  LASIX     senna-docusate 8.6-50 MG tablet  Commonly known as:  Senokot-S      TAKE these medications        LORazepam 2 MG/ML injection  Commonly known as:  ATIVAN  Inject 0.5 mLs (1 mg total) into the vein every 4 (four) hours as needed for anxiety, seizure or sedation.     morphine 2 MG/ML injection  Inject 1 mL (2 mg total) into the vein every 2 (two) hours as needed (DYSPNEA).     morphine CONCENTRATE 10 MG/0.5ML Soln concentrated solution  Take 0.25 mLs (5 mg total) by mouth every 2 (two) hours as needed for moderate pain (or dyspnea).       No Known Allergies    The results of significant diagnostics from this hospitalization (including imaging, microbiology, ancillary and laboratory) are listed below for reference.    Significant Diagnostic Studies: Ct Head Wo Contrast  11/22/2015  ADDENDUM REPORT: 11/22/2015 12:12 ADDENDUM: Study discussed by telephone with Dr. Roland [name] on 11/22/2015 at 1208 hours. He advises that the patient did briefly have an EVD placement between today study an the exam on 11/19/2015 - the EVD was replaced but the patient then pulled it out. Therefore I feel that is the most likely explanation for the interval increased intracranial gas. CONCLUSION: Interval increased intracranial gas related to interval EVD placement and removal. Otherwise stable CT appearance of the brain since  11/19/2015. Electronically Signed   By: Jordan [name] M.D.   On: 11/22/2015 12:12  11/22/2015  CLINICAL DATA:  70 year old male with intracranial hemorrhage, bacterial meningitis. Initial encounter. EXAM: CT HEAD WITHOUT CONTRAST TECHNIQUE: Contiguous axial images were obtained from the base of the skull through the vertex without intravenous contrast. COMPARISON:  11/19/2015 and earlier. FINDINGS: Study is mild degraded by motion artifact . Right superior approach external ventricular drain has been removed since the study on 11/15/2015. However, a tear has been increased pneumo ventricle since that time, now moderate in the right lateral ventricle. Overall ventricle size and configuration otherwise has not significantly changed. Intraventricular extension of the left periatrial and temporal lobe hemorrhage has regressed since 11/15/2015. There is also a small volume of pneumocephalus now along the anterior right frontal lobe. No subdural or extradural blood identified. No new intracranial hemorrhage identified. Confluent hypodensity in the posterior left hemisphere in temporal  lobe in a pattern most compatible with vasogenic edema has not significantly changed. Stable gray-white matter differentiation. No new cortically based infarct identified. No acute osseous abnormality identified. Stable trace ethmoid sinus mucosal thickening. Other Visualized paranasal sinuses and mastoids are clear. No acute orbit or scalp soft tissue finding identified. IMPRESSION: 1. Abnormal increasing pneumoventricle and now trace pneumocephalus since the 11/15/2015 exam on which the external ventricular drain had already been removed. The appearance is suspicious for progressive gas-forming infection. 2. Otherwise stable appearance of left hemisphere edema and intra-axial hemorrhage with intraventricular extension. No acute ventriculomegaly. Electronically Signed: By: Jordan [name] M.D. On: 11/22/2015 12:02   Ct Head Wo Contrast  11/19/2015   CLINICAL DATA:  Intra cerebral hemorrhage, follow-up EXAM: CT HEAD WITHOUT CONTRAST TECHNIQUE: Contiguous axial images were obtained from the base of the skull through the vertex without intravenous contrast. COMPARISON:  11/15/2015 FINDINGS: Left intra cerebral hemorrhage in the left parietal region again noted measuring 4.4 x 2.8 cm compared to 4.9 x 2.4 cm previously, likely not significantly changed. Surrounding edema is stable. No evidence of new or progressive hemorrhage. Intraventricular hemorrhage in the left lateral ventricle is again noted. Evolutionary changes. Time mildly increased prominence of the ventricles. Chronic microvascular disease throughout the deep white matter. 3 mm of left-to-right midline shift. No acute calvarial abnormality. IMPRESSION: Left parietal intraparenchymal hemorrhage not significantly changed. Stable surrounding edema. Intraventricular extension again noted. The ventricles are more prominent on today's study suggesting developing hydrocephalus. Slight left-to-right midline shift, 3 mm. Electronically Signed   By: Rolm Baptise M.D.   On: 11/19/2015 13:05   Ct Head Wo Contrast  11/15/2015  CLINICAL DATA:  Increased confusion, decreased responsiveness this evening after having IVC removed. EXAM: CT HEAD WITHOUT CONTRAST TECHNIQUE: Contiguous axial images were obtained from the base of the skull through the vertex without intravenous contrast. COMPARISON:  11/10/2015 FINDINGS: Acute left temporoparietal intraparenchymal hematoma measuring 2.4 x 4.9 cm. Surrounding edema in the deep white matter. Intraventricular hemorrhage demonstrated in the left lateral ventricle and third ventricle. Hematomas are becoming less conspicuous than on previous study, likely due to normal evolution. No evidence of progression of hemorrhage. Postsurgical changes in the right anterior frontal region corresponding to the previous intraventricular catheter, removed since prior study. Small amount of  subdural and intraventricular gas likely resulting from previous catheterization and less prominent than on previous study. Mild residual ventricular dilatation, somewhat less prominent than on prior study. There is residual sulcal effacement on the left with about 4 mm residual left-to-right midline shift. Underlying diffuse cerebral atrophy. Low-attenuation changes in the white matter consistent with small vessel ischemia. Basal cisterns are not effaced. No abnormal extra-axial fluid collections. IMPRESSION: Left temporoparietal intraventricular hematoma with surrounding white matter edema. Intraventricular hematoma. Intraventricular and subdural gas. Hematomas are becoming less distinct since previous study and ventricular dilatation and intraventricular gas is decreasing. Interval removal of intraventricular catheter. Mild residual left-to-right midline shift of about 4 mm. Electronically Signed   By: Lucienne Capers M.D.   On: 11/15/2015 21:51   Ct Head Wo Contrast  11/10/2015  CLINICAL DATA:  Fall.  Intracranial hemorrhage.  Ventricular drain. EXAM: CT HEAD WITHOUT CONTRAST TECHNIQUE: Contiguous axial images were obtained from the base of the skull through the vertex without intravenous contrast. COMPARISON:  CT head 11/09/2015 FINDINGS: Left parietal parenchymal hematoma unchanged measuring approximately 3.5 x 3.0 cm. There is ventricular extension left greater than right which is unchanged. Ventricular drain in the right frontal horn unchanged. Gas  in the frontal horns bilaterally has increased since yesterday. Ventricular size has increased from yesterday however there is no significant hydrocephalus. Ventricles were slit-like yesterday. No new area of hemorrhage since yesterday. There is considerable edema in the left parietal lobe around the hemorrhage. This is unchanged. Mild shift of the midline structures to the right IMPRESSION: Left parietal hemorrhage with intraventricular extension is  unchanged. No new area of hemorrhage. Ventricular size is slightly larger than yesterday with increased gas the ventricles. No significant hydrocephalus. Electronically Signed   By: Franchot Gallo M.D.   On: 11/10/2015 14:56   Ct Head Wo Contrast  11/09/2015  CLINICAL DATA:  Followup intraventricular hemorrhage. EXAM: CT HEAD WITHOUT CONTRAST TECHNIQUE: Contiguous axial images were obtained from the base of the skull through the vertex without intravenous contrast. COMPARISON:  CT head November 07, 2015 FINDINGS: Evolving LEFT temporal parietal 3.1 x 3.1 cm intraparenchymal hematoma and surrounding vasogenic edema, relatively unchanged in appearance with intraventricular extension. Stable position of RIGHT frontal ventriculostomy catheter with distal tip at the foramen of Monro. Anterior recess of the third ventricle is 10 mm, relatively unchanged. Decreasing LEFT ventricular entrapment, previously 18 mm, now 14 mm. Small amount of RIGHT temporal and RIGHT frontal horn pneumocephalus. Persistent LEFT periventricular transependymal flow cerebral spinal fluid. Similar 5 mm LEFT-to-RIGHT midline shift. Mild LEFT uncal herniation. No acute large vascular territory infarct. Ocular globes and orbital contents are normal. Trace paranasal sinus mucosal thickening without air-fluid levels. The mastoid air cells are well aerated. RIGHT frontal burr hole, no skull fracture. IMPRESSION: Evolving LEFT cerebral intraparenchymal hematoma with intraventricular extension. Stable position of RIGHT frontal ventriculostomy catheter, similar mild hydrocephalus, decreasing LEFT ventricular entrapment. Similar 5 mm LEFT-to-RIGHT midline shift, and mild LEFT uncal herniation. Electronically Signed   By: Elon Alas M.D.   On: 11/09/2015 03:27   Ct Head Wo Contrast  11/07/2015  CLINICAL DATA:  Followup cerebral hemorrhage/ stroke. History of hypertension. EXAM: CT HEAD WITHOUT CONTRAST TECHNIQUE: Contiguous axial images were  obtained from the base of the skull through the vertex without intravenous contrast. COMPARISON:  CT head November 06, 2015 FINDINGS: Evolving LEFT periatrial hematoma measuring 2.8 x 3.4 cm, previously 2.7 x 3.8 cm. Intraventricular extension, with diffuse intraventricular hemorrhage. Anterior recess of third ventricle is 10 mm, previously 12 mm. Lateral dimension LEFT temporal horn is 18 mm, previously 21 mm. RIGHT frontal ventriculostomy catheter with distal tip at the level of the foramen of Monro is/ anterior recess. Slightly decreased transependymal flow/ interstitial edema about the LEFT ventricle. 5 mm LEFT-to-RIGHT midline shift, previously 7 mm. Mild LEFT uncal herniation. Basal cisterns are patent. Trace residual RIGHT frontal extra-axial pneumocephalus. Moderate calcific atherosclerosis the carotid siphons. Ocular globes and orbital contents are normal. Paranasal sinuses and mastoid air cells are well aerated. No skull fracture. IMPRESSION: Evolving LEFT cerebral hematoma with intraventricular extension. Stable position of RIGHT frontal ventriculostomy catheter with resolving hydrocephalus, decreasing LEFT ventricular entrapment. 5 mm LEFT-to-RIGHT midline shift, improved. Electronically Signed   By: Elon Alas M.D.   On: 11/07/2015 04:59   Ct Head Wo Contrast  11/06/2015  CLINICAL DATA:  Follow-up. History of hypertension, chronic kidney disease and stroke. EXAM: CT HEAD WITHOUT CONTRAST TECHNIQUE: Contiguous axial images were obtained from the base of the skull through the vertex without intravenous contrast. COMPARISON:  CT head November 05, 2015 FINDINGS: 3.8 x 2.7 cm evolving LEFT frontoparietal hematoma with intraventricular extension. Blood products distend the LEFT temporal horn with transependymal flow cerebral spinal fluid.  Additional blood products in the RIGHT lateral ventricle, third and fourth ventricle. Interval placement of RIGHT frontal ventriculostomy catheter with distal tip at  the level of the foramen of Monro. The anterior recess of the third ventricle was 2 cm, now 1.2 cm. 7 mm LEFT-to-RIGHT midline shift increased from prior examination, with new small amount of extra-axial pneumocephalus on the RIGHT. Basal cisterns are patent. Moderate calcific atherosclerosis of the carotid siphons. Ocular globes and orbital contents are normal. Minimal paranasal sinus mucosal thickening without air-fluid levels. The mastoid air cells are well aerated. No skull fracture. New RIGHT frontal burr hole. IMPRESSION: Evolving LEFT intraparenchymal hematoma with intraventricular extension. Interval placement of RIGHT frontal ventriculostomy catheter with resolving hydrocephalus, however there is persistent LEFT temporal horn entrapment. 7 mm LEFT-to-RIGHT midline shift is likely postprocedural. New small amount of RIGHT frontal extra-axial pneumocephalus. Electronically Signed   By: Elon Alas M.D.   On: 11/06/2015 05:40   Ct Head Wo Contrast  11/05/2015  CLINICAL DATA:  Intracranial hemorrhage EXAM: CT HEAD WITHOUT CONTRAST TECHNIQUE: Contiguous axial images were obtained from the base of the skull through the vertex without intravenous contrast. COMPARISON:  Head CT 11/05/2015 at 827 hours FINDINGS: Large intraventricular hemorrhage predominantly on the LEFT. The high-density hemorrhage within the LEFT lateral ventricle/atria is increased in volume measuring 3.7 x 3.3 cm (image 17, series 3) compared to 2.7 by 2.4 cm. There is increased edema surrounding the LEFT ventricle atria. The volume of blood within the temporal horn is slightly increased measuring 20 mm compares 53m. There is no significant midline shift. There is hemorrhage within the third ventricle which is not changed in volume. Small amount hemorrhage in the bilateral anterior ventricles is not changed. Additionally there is a blood within the fourth ventricle which is unchanged in volume. No parenchymal hemorrhage identified. No  extra-axial fluid collections. Patent basilar cisterns. IMPRESSION: 1. Increase in volume of hemorrhage within the LEFT lateral ventricle anomaly within the LEFT atria and to lesser degree the temporal horn. 2. Increase in parenchymal edema surrounding the LEFT lateral ventricle hemorrhage. 3. No midline shift.  Basilar cisterns are patent. 4. No change in volume of hemorrhage within the third ventricle and fourth ventricle and anterior ventricles. Findings conveyed toJOSEPH ZAMMIT on 11/05/2015  at10:59. Electronically Signed   By: SSuzy BouchardM.D.   On: 11/05/2015 11:02   Ct Head Wo Contrast  11/05/2015  CLINICAL DATA:  Recent CABG with nausea and vomiting. Unable to follow commands. EXAM: CT HEAD WITHOUT CONTRAST TECHNIQUE: Contiguous axial images were obtained from the base of the skull through the vertex without intravenous contrast. COMPARISON:  07/28/2015 FINDINGS: Motion artifact over the superior images. Examination demonstrates an acute parenchymal hemorrhage abutting the atria of the left lateral ventricle measuring approximately 1.6 x 2.6 cm with mild adjacent edema. Moderate acute hemorrhage filling the left lateral ventricle and extending into the right lateral ventricle, third ventricle and fourth ventricles. Ventricles are within normal in size although minimally expanded compared to the prior exam. There is no evidence of midline shift. There is evidence of chronic ischemic microvascular disease. Small old lacunar infarct over the left lentiform nucleus. Remaining bones and soft tissues are within normal. IMPRESSION: New acute parenchymal hemorrhage abutting the atria of the left lateral ventricle measuring approximate 1.6 x 2.6 cm with mild adjacent edema. Hemorrhage extends into the ventricular system as described. Chronic ischemic microvascular disease. Small old left-sided lacunar infarct. Critical Value/emergent results were called by telephone at the time of  interpretation on 11/05/2015  at 8:45 am to Dr. Milton Ferguson , who verbally acknowledged these results. Electronically Signed   By: Marin Olp M.D.   On: 11/05/2015 08:45   Dg Chest Port 1 View  11/24/2015  CLINICAL DATA:  Acute respiratory failure EXAM: PORTABLE CHEST 1 VIEW COMPARISON:  11/23/2015 FINDINGS: Very low lung volumes with central vascular congestion and basilar atelectasis. Postop changes of the heart. Feeding tube extends into the proximal duodenum with the tip not visualized. No large effusion or pneumothorax. Left subclavian central line tip at the innominate SVC junction. IMPRESSION: Very low lung volumes with central vascular congestion and basilar atelectasis. Electronically Signed   By: Jerilynn Mages.  Shick M.D.   On: 11/24/2015 07:46   Dg Chest Port 1 View  11/23/2015  CLINICAL DATA:  70 year old male with acute respiratory failure. Subsequent encounter. EXAM: PORTABLE CHEST 1 VIEW COMPARISON:  11/22/2015. FINDINGS: Post CABG and valve replacement. Heart size within normal limits. Tortuous aorta. Right central line tip proximal superior vena cava level without pneumothorax. Central pulmonary vascular prominence without segmental consolidation. Poor inspiration with elevated hemidiaphragms. Post cervical spine surgery. IMPRESSION: No significant change. Poor inspiratory exam with mild central pulmonary vascular prominence. Postoperative changes as noted above. Interval placement of feeding tube which courses below diaphragm. Tip not imaged on the current exam. Electronically Signed   By: Genia Del M.D.   On: 11/23/2015 07:51   Dg Chest Port 1 View  11/22/2015  CLINICAL DATA:  Acute respiratory failure, meningitis, central line placement. EXAM: PORTABLE CHEST 1 VIEW COMPARISON:  Portable chest x-ray of November 22, 2015 at 1:12 a.m. FINDINGS: The lungs remain mildly hypoinflated. There is no pneumothorax or pleural effusion. There is no alveolar infiltrate. There are mildly increased lung markings at the left lung base. The  heart is top-normal in size. The pulmonary vascularity is normal. There are post median sternotomy changes with evidence of previous mitral and tricuspid valve replacement. The left internal jugular venous catheter tip projects over the proximal SVC. IMPRESSION: Persistent hypo inflation with minimal subsegmental atelectasis at the left lung base. There is no evidence of pulmonary edema or alveolar pneumonia. Electronically Signed   By: David  Martinique M.D.   On: 11/22/2015 07:41   Dg Chest Port 1 View  11/22/2015  CLINICAL DATA:  Central line placement EXAM: PORTABLE CHEST 1 VIEW COMPARISON:  Yesterday FINDINGS: New left IJ central line with tip at the SVC origin. No pneumothorax. Low lung volumes with interstitial crowding/atelectasis. No effusion or edema. Chronic cardiomegaly. The patient is status post CABG and mitral/tricuspid valve replacement. IMPRESSION: 1. No adverse finding after central line placement. 2. Low volumes with basilar atelectasis. Electronically Signed   By: Monte Fantasia M.D.   On: 11/22/2015 01:46   Dg Chest Port 1 View  11/21/2015  CLINICAL DATA:  Acute respiratory failure. EXAM: PORTABLE CHEST 1 VIEW COMPARISON:  11/20/2015 FINDINGS: Postsurgical changes from prior median sternotomy, CABG and valvular replacement are stable. Cervical spine fusion is also seen. Cardiomediastinal silhouette is normal. Mediastinal contours appear intact. There is no evidence of focal airspace consolidation, pleural effusion or pneumothorax. Lung volumes are low. Osseous structures are without acute abnormality. Soft tissues are grossly normal. IMPRESSION: Low lung volumes with bibasilar atelectasis. Stable postsurgical changes. Electronically Signed   By: Fidela Salisbury M.D.   On: 11/21/2015 07:33   Dg Chest Port 1 View  11/20/2015  CLINICAL DATA:  Respiratory failure. EXAM: PORTABLE CHEST 1 VIEW COMPARISON:  11/19/2015. FINDINGS: Prior  CABG and cardiac valve replacement. Stable heart size. No  pulmonary venous congestion. Low lung volumes with mild bibasilar atelectasis and/or infiltrates. No pleural effusion or pneumothorax. Prior cervical spine fusion . IMPRESSION: 1. Prior CABG. Prior cardiac valve replacement. Heart size is stable. 2. Low lung volumes with mild bibasilar atelectasis and/or infiltrates. No interim change. Electronically Signed   By: Marcello Moores  Register   On: 11/20/2015 07:20   Dg Chest Port 1 View  11/19/2015  CLINICAL DATA:  70 year old nursing home patient with acute onset of shortness of breath and fever which began earlier today. Recent hospitalization for intracranial hemorrhage, presumably hemorrhagic stroke, for which he was discharged 2 days ago. EXAM: PORTABLE CHEST 1 VIEW COMPARISON:  11/11/2015 and earlier. FINDINGS: Suboptimal inspiration accounts for crowded bronchovascular markings diffusely and atelectasis in the bases, and accentuates the cardiac silhouette. Taking this into account, cardiac silhouette upper normal in size to slightly enlarged but stable. Prior CABG and aortic valve replacement. Lungs otherwise clear. No localized airspace consolidation. No pleural effusions. No pneumothorax. Normal pulmonary vascularity. IMPRESSION: Suboptimal inspiration accounts for mild bibasilar atelectasis. No acute cardiopulmonary disease otherwise. Electronically Signed   By: Evangeline Dakin M.D.   On: 11/19/2015 10:48   Dg Chest Port 1 View  11/11/2015  CLINICAL DATA:  Follow-up infiltrate EXAM: PORTABLE CHEST 1 VIEW COMPARISON:  11/07/2015 FINDINGS: Cardiomediastinal silhouette is stable. Status post median sternotomy. Metallic fixation plate cervical spine again noted. Endotracheal and NG tube has been removed. Improvement in aeration left base. Minimal residual streaky atelectasis or scarring. No new infiltrate or pulmonary edema. IMPRESSION: Endotracheal tube and NG tube has been removed. Improvement in aeration left base. Minimal residual streaky atelectasis or  scarring. No new infiltrate or pulmonary edema. Electronically Signed   By: Lahoma Crocker M.D.   On: 11/11/2015 11:44   Dg Chest Port 1 View  11/07/2015  CLINICAL DATA:  Intubation. EXAM: PORTABLE CHEST 1 VIEW COMPARISON:  11/06/2015 . FINDINGS: Endotracheal tube and NG tube in stable position. Prior CABG. Stable cardiomegaly. Low lung volumes with basilar atelectasis. Left lower lobe infiltrate noted. No pleural effusion or pneumothorax. Prior cervical spine fusion. IMPRESSION: 1. Lines and tubes in stable position. 2. Prior CABG.  Stable cardiomegaly.  No pulmonary venous congestion 3. Low volumes with persistent bibasilar atelectasis. Persistent left lower lobe infiltrate noted. Electronically Signed   By: Marcello Moores  Register   On: 11/07/2015 07:45   Dg Chest Port 1 View  11/06/2015  CLINICAL DATA:  Verify endotracheal tube placement EXAM: PORTABLE CHEST 1 VIEW COMPARISON:  11/06/2015 FINDINGS: Endotracheal tube tip 2 cm above the carina. NG tube crosses the gastroesophageal junction with side hole of the catheter just below the diaphragm. Replacement cardiac valve noted. Left internal jugular central line unchanged with tip over the superior vena cava. Bilateral hypoventilatory change. IMPRESSION: Endotracheal tube as described. Electronically Signed   By: Skipper Cliche M.D.   On: 11/06/2015 21:05   Dg Chest Port 1 View  11/06/2015  CLINICAL DATA:  Respiratory failure. EXAM: PORTABLE CHEST 1 VIEW COMPARISON:  11/05/2015 . FINDINGS: Endotracheal tube, left IJ line, NG tube in stable position. Prior cardiac valve replacement. Heart size stable. No pulmonary vascular congestion on today's exam. Improving left lower lobe infiltrate/edema. Low lung volumes. Small left pleural effusion cannot be excluded. No pneumothorax. Prior cervical spine fusion. IMPRESSION: 1. Lines and tubes in stable position. 2. Heart size stable. No pulmonary venous congestion on today's exam. 3. Persistent but improving left lower lobe  infiltrate/edema.  Low lung volumes. Small left pleural effusion cannot be excluded. Electronically Signed   By: Marcello Moores  Register   On: 11/06/2015 07:23   Dg Chest Port 1 View  11/05/2015  CLINICAL DATA:  Left IJ central venous catheter. EXAM: PORTABLE CHEST 1 VIEW COMPARISON:  11/05/2015 at 10:49 a.m. FINDINGS: Enteric tube is coiled once over the stomach with tip over the mid gastric body. Endotracheal tube unchanged with tip approximately 4.2 cm above the carina. There has been interval placement of a left IJ central venous catheter with tip obliquely warranted over the SVC just above the level of the carina. Patient is slightly rotated to the right. Lungs are hypoinflated with continued mild prominence of the perihilar markings suggesting minimal vascular congestion. No effusion or pneumothorax. Cardiomediastinal silhouette and remainder of the exam is unchanged. IMPRESSION: Hypoinflation with findings suggesting mild vascular congestion. Tubes and lines as described. Interval placement left IJ central venous catheter with tip over the SVC. No pneumothorax. Electronically Signed   By: Marin Olp M.D.   On: 11/05/2015 13:46   Dg Chest Portable 1 View  11/05/2015  CLINICAL DATA:  Intracerebral hemorrhage. Acute respiratory failure. Intubation. EXAM: PORTABLE CHEST 1 VIEW COMPARISON:  11/05/2015 FINDINGS: New endotracheal tube and nasogastric tube are seen in appropriate position. Improved aeration of both lungs is seen with decreased bibasilar atelectasis. No evidence of pulmonary consolidation or pleural effusion. 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 IMPRESSION: Endotracheal tube and nasogastric tube in appropriate position. Improved aeration with decreased bibasilar atelectasis. Electronically Signed   By: Earle Gell M.D.   On: 11/05/2015 11:51   Dg Chest Port 1 View  11/05/2015  CLINICAL DATA:  Nausea and vomiting. EXAM: PORTABLE CHEST 1 VIEW  COMPARISON:  10/24/2015 FINDINGS: Sternotomy wires and prosthetic heart valve unchanged. Lungs are hypoinflated with minimal prominence of the perihilar markings suggesting mild vascular congestion. Mild opacification over the medial left base which may represent effusion and atelectasis although cannot exclude infection. Cardiomediastinal silhouette and remainder of the exam is unchanged. IMPRESSION: Hypo inflation with mild opacification of the medial left base which may be due to effusions/atelectasis although cannot exclude infection. Suggestion of minimal vascular congestion. Electronically Signed   By: Marin Olp M.D.   On: 11/05/2015 07:53   Dg Abd Portable 1v  11/22/2015  CLINICAL DATA:  Encounter for feeding tube placement EXAM: PORTABLE ABDOMEN - 1 VIEW COMPARISON:  11/06/2015 FINDINGS: The tip of the feeding tube is in the expected location of the ligament of Treitz. Normal bowel gas pattern. IMPRESSION: 1. Feeding tube tip is at the level of the ligament of Treitz. Electronically Signed   By: Kerby Moors M.D.   On: 11/22/2015 16:57   Dg Abd Portable 1v  11/06/2015  CLINICAL DATA:  Verify a OG tube placement EXAM: PORTABLE ABDOMEN - 1 VIEW COMPARISON:  None FINDINGS: Orogastric tube crosses the gastroesophageal junction with side hole of the catheter at the gastroesophageal junction and tip of the tube into the stomach by approximately 3-4 cm. Nonobstructive gas pattern. IMPRESSION: Orogastric tube just crosses into the stomach and should be advanced. Electronically Signed   By: Skipper Cliche M.D.   On: 11/06/2015 21:13    Microbiology: Recent Results (from the past 240 hour(s))  Blood Culture (routine x 2)     Status: None   Collection Time: 11/19/15 10:27 AM  Result Value Ref Range Status   Specimen Description BLOOD LEFT ANTECUBITAL  Final  Special Requests BOTTLES DRAWN AEROBIC AND ANAEROBIC 5CC  Final   Culture NO GROWTH 5 DAYS  Final   Report Status 11/24/2015 FINAL  Final   Urine culture     Status: None   Collection Time: 11/19/15 10:31 AM  Result Value Ref Range Status   Specimen Description URINE, CATHETERIZED  Final   Special Requests NONE  Final   Culture NO GROWTH 1 DAY  Final   Report Status 11/20/2015 FINAL  Final  Blood Culture (routine x 2)     Status: None   Collection Time: 11/19/15 10:35 AM  Result Value Ref Range Status   Specimen Description BLOOD RIGHT ANTECUBITAL  Final   Special Requests BOTTLES DRAWN AEROBIC AND ANAEROBIC 5CC  Final   Culture NO GROWTH 5 DAYS  Final   Report Status 11/24/2015 FINAL  Final  Fungus Culture with Smear     Status: None (Preliminary result)   Collection Time: 11/20/15 12:14 PM  Result Value Ref Range Status   Specimen Description CSF  Final   Special Requests NONE  Final   Fungal Smear   Final    NO YEAST OR FUNGAL ELEMENTS SEEN Performed at [facility]    Culture   Final    CULTURE IN PROGRESS FOR FOUR WEEKS Performed at [facility]    Report Status PENDING  Incomplete  CSF culture     Status: None   Collection Time: 11/20/15 12:14 PM  Result Value Ref Range Status   Specimen Description CSF  Final   Special Requests NONE  Final   Gram Stain   Final    FEW WBC PRESENT,BOTH PMN AND MONONUCLEAR NO ORGANISMS SEEN    Culture NO GROWTH 3 DAYS  Final   Report Status 11/23/2015 FINAL  Final     Labs: Basic Metabolic Panel:  Recent Labs Lab 11/22/15 0550 11/23/15 0545 11/24/15 0600 11/25/15 0445 11/26/15 0515 11/27/15 0312  NA 148* 146* 142 141 144 141  K 3.5 3.2* 3.5 3.6 3.4* 4.0  CL 113* 113* 111 110 114* 112*  CO2 20* 21* 19* 19* 21* 19*  GLUCOSE 115* 129* 129* 104* 108* 113*  BUN 37* 36* 35* 33* 30* 28*  CREATININE 1.80* 1.72* 1.55* 1.43* 1.32* 1.23  CALCIUM 8.8* 8.5* 8.2* 8.5* 8.3* 8.6*  MG 2.4 2.3 2.1  --   --   --   PHOS 3.9 3.4 2.6  --   --   --    Liver Function Tests:  Recent Labs Lab 11/24/15 1344 11/26/15 0515 11/27/15 0312  AST 74* 29 25  ALT  224* 110* 93*  ALKPHOS 194* 157* 155*  BILITOT 0.1* 0.6 0.5  PROT 5.9* 5.3* 5.8*  ALBUMIN 2.5* 2.4* 2.6*   No results for input(s): LIPASE, AMYLASE in the last 168 hours. No results for input(s): AMMONIA in the last 168 hours. CBC:  Recent Labs Lab 11/23/15 0545 11/24/15 0600 11/25/15 0445 11/26/15 0515 11/27/15 0312  WBC 10.6* 12.8* 10.3 11.2* 11.9*  HGB 10.0* 9.6* 9.2* 9.0* 9.5*  HCT 32.1* 30.6* 30.0* 28.8* 30.5*  MCV 84.9 83.8 84.7 83.7 83.3  PLT 426* 438* 424* 393 403*   Cardiac Enzymes: No results for input(s): CKTOTAL, CKMB, CKMBINDEX, TROPONINI in the last 168 hours. BNP: BNP (last 3 results)  Recent Labs  10/08/15 2218 11/05/15 0844 11/19/15 1027  BNP 1513.6* 580.1* 348.7*    ProBNP (last 3 results) No results for input(s): PROBNP in the last 8760 hours.  CBG:  Recent Labs Lab 11/27/15 2345 11/28/15 0308 11/28/15 0749 11/28/15 1353 11/28/15 1547  GLUCAP 108* 98 94 113* 127*       Signed:  Dia Crawford, MD [facility] [phone] pager

## 2015-11-29 DIAGNOSIS — G4733 Obstructive sleep apnea (adult) (pediatric): Secondary | ICD-10-CM

## 2015-11-29 DIAGNOSIS — Z515 Encounter for palliative care: Secondary | ICD-10-CM

## 2015-11-29 MED ORDER — LORAZEPAM 2 MG/ML IJ SOLN: 0.5000 mg | Freq: Two times a day (BID) | INTRAMUSCULAR | Status: DC

## 2015-11-29 MED ORDER — LORAZEPAM 2 MG/ML IJ SOLN
0.5000 mg | Freq: Two times a day (BID) | INTRAMUSCULAR | Status: DC
  Administered 2015-11-29 – 2015-11-30 (×3): 0.5 mg via INTRAVENOUS
  Filled 2015-11-29 (×3): qty 1

## 2015-11-29 NOTE — Progress Notes (Signed)
Nutrition Brief Note  Chart reviewed. Pt now transitioning to comfort care.  No further nutrition interventions warranted at this time.  Please re-consult as needed.   Kendarrius Tanzi A. Kizzi Overbey, RD, LDN, CDE Pager: [phone] After hours Pager: [phone]  

## 2015-11-29 NOTE — Care Management Important Message (Signed)
Important Message  Patient Details  Name: Jordan [name] MRN: [id number] Date of Birth: [birth year]   Medicare Important Message Given:  Yes    [name] 11/29/2015, 1:45 PMImportant Message  Patient Details  Name: Jordan [name] MRN: [id number] Date of Birth: [birth year]   Medicare Important Message Given:  Yes    [name] G 11/29/2015, 1:45 PM

## 2015-11-29 NOTE — Progress Notes (Addendum)
Daily Progress Note   Patient Name: Jordan [name] [facility]       Date: 11/29/2015 DOB: April 16, 1946  Age: 70 y.o. MRN#: [id number] Attending Physician: Annita Brod, MD Primary Care Physician: Lilian Coma, MD Admit Date: 11/19/2015  Reason for Consultation/Follow-up: Terminal Care  Subjective: Patient unresponsive.  Interval Events: Moved to 6N.  Removed N/G, [place], Tele.  Patient with some moaning and furrowed brow.  Met Stacy (grandaughter?) at bedside and answered her questions.  Left written letter for [facility] regarding meningitis and whether or not - to the best of our knowledge - Mr. Nan was not contagious.   Length of Stay: 10 days  Current Medications: Scheduled Meds:  . antiseptic oral rinse  7 mL Mouth Rinse q12n4p  . chlorhexidine  15 mL Mouth Rinse BID  . LORazepam  0.5 mg Intravenous BID  .  morphine injection  2 mg Intravenous Q4H  . sodium chloride flush  3 mL Intravenous Q12H    Continuous Infusions:    PRN Meds: sodium chloride, acetaminophen **OR** acetaminophen, glycopyrrolate **OR** glycopyrrolate **OR** glycopyrrolate, haloperidol lactate, LORazepam, morphine injection, morphine CONCENTRATE **OR** morphine CONCENTRATE, ondansetron **OR** ondansetron (ZOFRAN) IV, sodium chloride flush   Physical Exam             Elderly male lying unresponsive in bed with some shifting and moaning.  Warm to touch. CV RRR Resp no increased work of breathing.   Vital Signs: BP 121/91 mmHg  Pulse 102  Temp(Src) 98.6 F (37 C) (Axillary)  Resp 19  Ht '5\' 9"'  (1.753 m)  Wt 88.5 kg (195 lb 1.7 oz)  BMI 28.80 kg/m2  SpO2 98% SpO2: SpO2: 98 % O2 Device: O2 Device: Not Delivered O2 Flow Rate: O2 Flow Rate (L/min): 8 L/min  Intake/output summary:  Intake/Output  Summary (Last 24 hours) at 11/29/15 1643 Last data filed at 11/28/15 2300  Gross per 24 hour  Intake      0 ml  Output    675 ml  Net   -675 ml   LBM: Last BM Date: 11/28/15 Baseline Weight: Weight: 86.183 kg (190 lb) Most recent weight: Weight: 88.5 kg (195 lb 1.7 oz)       Palliative Assessment/Data: Flowsheet Rows        Most Recent Value   Intake Tab  Referral Department  Hospitalist   Unit at Time of Referral  Intermediate Care Unit   Palliative Care Primary Diagnosis  Neurology   Date Notified  11/27/15   Palliative Care Type  New Palliative care   Reason for referral  Clarify Goals of Care, Counsel Regarding Hospice   Date of Admission  11/19/15   Date first seen by Palliative Care  11/28/15   # of days Palliative referral response time  1 Day(s)   # of days IP prior to Palliative referral  8   Clinical Assessment    Psychosocial & Spiritual Assessment    Palliative Care Outcomes       Additional Data Reviewed: CBC    Component Value Date/Time   WBC 11.9* 11/27/2015 0312   RBC 3.66* 11/27/2015 0312   RBC 3.44* 11/26/2015 0515   HGB 9.5* 11/27/2015 0312   HCT 30.5* 11/27/2015 0312   PLT 403* 11/27/2015 0312   MCV 83.3 11/27/2015 0312   MCH 26.0 11/27/2015 0312   MCHC 31.1 11/27/2015 0312   RDW 15.9* 11/27/2015 0312   LYMPHSABS 1.9 11/19/2015 1027   MONOABS 1.0 11/19/2015 1027   EOSABS 0.0 11/19/2015 1027   BASOSABS 0.0 11/19/2015 1027    CMP     Component Value Date/Time   NA 141 11/27/2015 0312   K 4.0 11/27/2015 0312   CL 112* 11/27/2015 0312   CO2 19* 11/27/2015 0312   GLUCOSE 113* 11/27/2015 0312   BUN 28* 11/27/2015 0312   CREATININE 1.23 11/27/2015 0312   CREATININE 1.96* 11/28/2012 1233   CALCIUM 8.6* 11/27/2015 0312   PROT 5.8* 11/27/2015 0312   ALBUMIN 2.6* 11/27/2015 0312   AST 25 11/27/2015 0312   ALT 93* 11/27/2015 0312   ALKPHOS 155* 11/27/2015 0312   BILITOT 0.5 11/27/2015 0312   GFRNONAA 57* 11/27/2015 0312   GFRAA >60  11/27/2015 0312       Problem List:  Patient Active Problem List   Diagnosis Date Noted  . OSA (obstructive sleep apnea)   . CAD in native artery   . Hemorrhage, intracranial ([place])   . Hyponatremia   . Hypokalemia   . Palliative care encounter   . End of life care   . Meningitis   . Encounter for central line placement   . Acute respiratory failure ([place])   . Bacterial meningitis   . Obstructive hydrocephalus   . Altered mental status 11/19/2015  . Acute respiratory failure with hypoxia ([place])   . Nontraumatic subcortical hemorrhage of left cerebral hemisphere ([place])   . Essential hypertension   . Atrial fibrillation ([place])   . ICH (intracerebral hemorrhage) ([place]) 11/05/2015  . Respiratory failure ([place])   . Postoperative anemia due to acute blood loss 10/30/2015  . CAD (coronary artery disease)   . S/P CABG (coronary artery bypass graft)   . S/P CABG x 4   . Tricuspid valve regurgitation 10/13/2015  . ACS (acute coronary syndrome) ([place])   . Acute congestive heart failure ([place])   . Atrial fibrillation with RVR ([place]) 10/10/2015  . Demand ischemia ([place]) 10/09/2015  . Non-STEMI (non-ST elevated myocardial infarction) ([place]) 10/08/2015  . Acute diastolic (congestive) heart failure ([place]) 10/08/2015  . Stage III chronic kidney disease 05/06/2015  . Hiatal hernia 05/06/2015  . Cervical spondylosis with radiculopathy 12/04/2012  . Hyperlipidemia 11/21/2007  . Obstructive sleep apnea 11/21/2007  . Hypertensive heart disease with CHF (congestive heart failure) [facility])      Palliative Care Assessment & Plan  1.Code Status:  DNR  2. Goals of Care/Additional Recommendations:  Full comfort care.  3. Symptom Management:      Pain:  Morphine both scheduled and PRN      Anxiety:  Ativan both scheduled and PRN  4. Palliative Prophylaxis:   Delirium Protocol, Frequent Pain Assessment and Turn Reposition  5. Prognosis: < 2 weeks  6. Discharge Planning:  Hospice  facility   Care plan was discussed with Stacy at bedside.  Thank you for allowing the Palliative Medicine Team to assist in the care of this patient.   Time In: 9:00 Time Out: 9:30 Total Time 30 min Prolonged Time Billed No         Imogene Burn, [place] [facility] Pager: [phone]  11/29/2015, 4:43 PM  Please contact Palliative Medicine Team phone at [phone] for questions and concerns.

## 2015-11-29 NOTE — Clinical Social Work Note (Signed)
CSW received consult for residential hospice and was informed that family wants [facility], and referrals made to [facility]. CSW also contacted Tresa Endo, admissions Interior and spatial designer with [employer] regarding patient returning to this facility and was informed that she will consult with the administrator and f/u with CSW.  Genelle Bal, MSW, LCSW Licensed Clinical Social Worker Clinical Social Work Department [employer] [phone]

## 2015-11-29 NOTE — Progress Notes (Signed)
Patient with no change from previous day. Family present. Occasionally opens eyes and moans. Wife reportedly stated that he didn't follow command to give her kids this morning. Family breathing, but understanding of situation. Awaiting hospice facility bed availability. Continue comfort care.

## 2015-11-30 DIAGNOSIS — I629 Nontraumatic intracranial hemorrhage, unspecified: Secondary | ICD-10-CM

## 2015-11-30 MED ORDER — LORAZEPAM 2 MG/ML IJ SOLN
0.5000 mg | Freq: Four times a day (QID) | INTRAMUSCULAR | Status: DC | PRN
  Administered 2015-11-30: 0.5 mg via INTRAVENOUS
  Filled 2015-11-30: qty 1

## 2015-11-30 MED ORDER — WHITE PETROLATUM GEL
Status: AC
  Administered 2015-11-30: 0.2
  Filled 2015-11-30: qty 1

## 2015-11-30 MED ORDER — MORPHINE SULFATE (PF) 2 MG/ML IV SOLN
1.0000 mg | INTRAVENOUS | Status: DC | PRN
  Administered 2015-11-30 – 2015-12-01 (×4): 1 mg via INTRAVENOUS
  Filled 2015-11-30 (×4): qty 1

## 2015-11-30 NOTE — Progress Notes (Signed)
Daily Progress Note   Patient Name: Jordan [name] [facility]       Date: 11/30/2015 DOB: [birth year]  Age: 70 y.o. MRN#: [id number] Attending Physician: Hollice Espy, MD Primary Care Physician: Emeterio Reeve, MD Admit Date: 11/19/2015  Reason for Consultation/Follow-up: Establishing goals of care   Interval Events:  Now that the patient has moved to 6N and N/G and mitts have been removed Jordan [name] appears to have become more alert and interactive with his family members.  He requested ice and was able to eat ice chips.   The family has requested a diet.  A speech therapy evaluation has been requested.    The family wants to give him the opportunity to see if he can eat & support himself nutritionally. If he can drink / eat then he deserves an opportunity to continue to improve.  If he can not - then his advanced directives are very clear - he does not want his life prolonged.  The following is a powerful excerpt from Jordan [name] personal advanced directives:    "I do not fear death as much as the indignities of deterioration, dependence and hopeless pain.  I therefore ask that medication be mercifully administered to me to alleviate suffering even though this may hasten the actual moment of death."   Scheduled and PRN medications for pain and anxiety have been discontinued in order to permit the patient to be alert and to potentially eat.  If he is observed to be in any distress please contact Palliative Medicine [phone]) between 7 am - 7 pm or [facility] APP on call after 7 pm in order to obtain appropriate PRN medications.   Length of Stay: 11 days  Current Medications: Scheduled Meds:  . antiseptic oral rinse  7 mL Mouth Rinse q12n4p  . chlorhexidine  15 mL Mouth Rinse BID    Continuous  Infusions:    PRN Meds: acetaminophen **OR** acetaminophen, glycopyrrolate **OR** glycopyrrolate **OR** [DISCONTINUED] glycopyrrolate, ondansetron **OR** ondansetron (ZOFRAN) IV   Physical Exam              Well developed male, lethargic, sutures in his head Eyes: PER CV:  RRR Resp:  No increased work of breathing Extremities:  Moves all 4, no edema Neuro:  Mumbles incoherently, does not follow commands.    Vital  Signs: BP 119/86 mmHg  Pulse 98  Temp(Src) 98.3 F (36.8 C) (Axillary)  Resp 17  Ht 5\' 9"  (1.753 m)  Wt 88.5 kg (195 lb 1.7 oz)  BMI 28.80 kg/m2  SpO2 98% SpO2: SpO2: 98 % O2 Device: O2 Device: Not Delivered O2 Flow Rate: O2 Flow Rate (L/min): 8 L/min  Intake/output summary:  Intake/Output Summary (Last 24 hours) at 11/30/15 1522 Last data filed at 11/30/15 1406  Gross per 24 hour  Intake     30 ml  Output   1000 ml  Net   -970 ml   LBM: Last BM Date: 11/28/15 Baseline Weight: Weight: 86.183 kg (190 lb) Most recent weight: Weight: 88.5 kg (195 lb 1.7 oz)       Palliative Assessment/Data: Flowsheet Rows        Most Recent Value   Intake Tab    Referral Department  Hospitalist   Unit at Time of Referral  Intermediate Care Unit   Palliative Care Primary Diagnosis  Neurology   Date Notified  11/27/15   Palliative Care Type  New Palliative care   Reason for referral  Clarify Goals of Care, Counsel Regarding Hospice   Date of Admission  11/19/15   Date first seen by Palliative Care  11/28/15   # of days Palliative referral response time  1 Day(s)   # of days IP prior to Palliative referral  8   Clinical Assessment    Psychosocial & Spiritual Assessment    Palliative Care Outcomes       Additional Data Reviewed: CBC    Component Value Date/Time   WBC 11.9* 11/27/2015 0312   RBC 3.66* 11/27/2015 0312   RBC 3.44* 11/26/2015 0515   HGB 9.5* 11/27/2015 0312   HCT 30.5* 11/27/2015 0312   PLT 403* 11/27/2015 0312   MCV 83.3 11/27/2015 0312   MCH  26.0 11/27/2015 0312   MCHC 31.1 11/27/2015 0312   RDW 15.9* 11/27/2015 0312   LYMPHSABS 1.9 11/19/2015 1027   MONOABS 1.0 11/19/2015 1027   EOSABS 0.0 11/19/2015 1027   BASOSABS 0.0 11/19/2015 1027    CMP     Component Value Date/Time   NA 141 11/27/2015 0312   K 4.0 11/27/2015 0312   CL 112* 11/27/2015 0312   CO2 19* 11/27/2015 0312   GLUCOSE 113* 11/27/2015 0312   BUN 28* 11/27/2015 0312   CREATININE 1.23 11/27/2015 0312   CREATININE 1.96* 11/28/2012 1233   CALCIUM 8.6* 11/27/2015 0312   PROT 5.8* 11/27/2015 0312   ALBUMIN 2.6* 11/27/2015 0312   AST 25 11/27/2015 0312   ALT 93* 11/27/2015 0312   ALKPHOS 155* 11/27/2015 0312   BILITOT 0.5 11/27/2015 0312   GFRNONAA 57* 11/27/2015 0312   GFRAA >60 11/27/2015 0312       Problem List:  Patient Active Problem List   Diagnosis Date Noted  . OSA (obstructive sleep apnea)   . CAD in native artery   . Hemorrhage, intracranial (HCC)   . Hyponatremia   . Hypokalemia   . Palliative care encounter   . End of life care   . Meningitis   . Encounter for central line placement   . Acute respiratory failure (HCC)   . Bacterial meningitis   . Obstructive hydrocephalus   . Altered mental status 11/19/2015  . Acute respiratory failure with hypoxia (HCC)   . Nontraumatic subcortical hemorrhage of left cerebral hemisphere (HCC)   . Essential hypertension   . Atrial fibrillation (HCC)   .  ICH (intracerebral hemorrhage) (HCC) 11/05/2015  . Respiratory failure (HCC)   . Postoperative anemia due to acute blood loss 10/30/2015  . CAD (coronary artery disease)   . S/P CABG (coronary artery bypass graft)   . S/P CABG x 4   . Tricuspid valve regurgitation 10/13/2015  . ACS (acute coronary syndrome) (HCC)   . Acute congestive heart failure (HCC)   . Atrial fibrillation with RVR (HCC) 10/10/2015  . Demand ischemia (HCC) 10/09/2015  . Non-STEMI (non-ST elevated myocardial infarction) (HCC) 10/08/2015  . Acute diastolic (congestive)  heart failure (HCC) 10/08/2015  . Stage III chronic kidney disease 05/06/2015  . Hiatal hernia 05/06/2015  . Cervical spondylosis with radiculopathy 12/04/2012  . Hyperlipidemia 11/21/2007  . Obstructive sleep apnea 11/21/2007  . Hypertensive heart disease with CHF (congestive heart failure) [facility])      Palliative Care Assessment & Plan    1.Code Status:  DNR   2. Goals of Care/Additional Recommendations:  Obtain speech therapy evaluation and trial diet.  If he can eat we will re-evaluate our discharge plan of care.  If  he is unable to eat/drink his advanced directives clearly dictate comfort care.     3. Symptom Management:      Per primary team.  4. Palliative Prophylaxis:  Delirium, aspiration, frequent evaluation for pain, anxiety.  5. Prognosis: < 2 weeks if he is unable to take POs.  6. Discharge Planning:  To be determined.  Likely Residential Hospice.   Care plan was discussed with Patient's wife [facility].  Thank you for allowing the Palliative Medicine Team to assist in the care of this patient.   Time In: 3:00 Time Out: 3:35 Total Time 35 Prolonged Time Billed  no        Algis Downs, [place] [facility] Pager: [phone]  11/30/2015, 3:22 PM  Please contact Palliative Medicine Team phone at [phone] for questions and concerns.

## 2015-11-30 NOTE — Progress Notes (Signed)
Palliative Care RN Note. Pt is in bed, relaxed; per family, RN just gave morphine. Niece is at bedside. Pt is relaxed; he denies pain/discomfort/trouble breathing. Ice chips were offered; he tolerated small ice chips well. Obtained orders to stop KVO NS due to frequent IV pump beeping and family request, as pt does not keep his arm straight. Updated PA Algis Downs of status. Plan f/u by Palliative Care team daily until d/c to inpatient hospice. Donn Pierini, RN, BSN, [facility] 11/30/2015 9:22 AM

## 2015-11-30 NOTE — Consult Note (Signed)
[facility] Liaison: Received request from CSW Erie Noe for family interest in [place] 11/28/15. Unfortunately still no Catering manager. Will continue to follow until disposition determined. CSW [place] aware.   Thank you. Forrestine Him, LCSW [phone]

## 2015-11-30 NOTE — Progress Notes (Signed)
PROGRESS NOTE  Jordan [name] [facility] ZOX:[id number] DOB: 07-Jan-1946 DOA: 11/19/2015 PCP: Emeterio Reeve, MD  HPI/Recap of past 24 hours:  Patient is a 70 year old male past medical history of non-STEMI 2 months prior on chronic anticoagulation for atrial fibrillation who then unfortunately suffered intracerebral hemorrhage last month and was treated with drain placement. Patient subsequently pulled the drain out and the drain was replaced, patient remove that as well. She was discharged to nursing facility on 1/27, but returned 2 days later for atypical breathing pattern and decreased level of responsiveness. After extensive workup, patient found to have meningitis. He was treated aggressively with 10 days of antibiotics, however in that time, he has not really become too responsive. After some discussion with family, patient was made comfort care. Hospice facility bed was pending.  In the last 24-48 hours, patient has waxed and waned at times becoming slightly more awake and will follow and occasional command from wife. After discussion with family, he opted to hold his sedating medications and will attempt a swallow evaluation tomorrow. If he passes, then plan will be to change disposition. If he does not, plan will be to proceed with hospice care. Patient self when seen is somnolent. Unable to give me a review of systems.  Active Problems:   Altered mental status   Obstructive hydrocephalus   Acute respiratory failure (HCC)   Bacterial meningitis   Encounter for central line placement   Meningitis   OSA (obstructive sleep apnea)   CAD in native artery   Hemorrhage, intracranial (HCC)   Hyponatremia   Hypokalemia   Palliative care encounter   End of life care   Code Status: DO NOT RESUSCITATE  Family Communication: Wife and other family members at the bedside  Disposition Plan: Hospice facility patient does not pass swallow evaluation   Consultants:  Neurology  Critical  care  Neurosurgery  Infectious disease  Procedures: EEG done 1/29: Unrevealing  CVP catheter placed 2/1  Antibiotics:  Cefepime and vancomycin 1/29-2/7    Objective: BP 119/86 mmHg  Pulse 98  Temp(Src) 98.3 F (36.8 C) (Axillary)  Resp 17  Ht  (1.753 m)  Wt 88.5 kg (195 lb 1.7 oz)  BMI 28.80 kg/m2  SpO2 98%  Intake/Output Summary (Last 24 hours) at 11/30/15 1827 Last data filed at 11/30/15 1637  Gross per 24 hour  Intake     30 ml  Output   1650 ml  Net  -1620 ml   Filed Weights   11/27/15 0442 11/28/15 0500 11/28/15 2119  Weight: 89 kg (196 lb 3.4 oz) 89.5 kg (197 lb 5 oz) 88.5 kg (195 lb 1.7 oz)    Exam:   General:  Somnolent  Cardiovascular: Regular rate and rhythm, S1-S2   Respiratory: Clear to auscultation bilaterally   Abdomen: Soft, distended, hypoactive bowel sounds  Musculoskeletal: No clubbing or cyanosis, trace edema  Data Reviewed: Basic Metabolic Panel:  Recent Labs Lab 11/24/15 0600 11/25/15 0445 11/26/15 0515 11/27/15 0312  NA 142 141 144 141  K 3.5 3.6 3.4* 4.0  CL 111 110 114* 112*  CO2 19* 19* 21* 19*  GLUCOSE 129* 104* 108* 113*  BUN 35* 33* 30* 28*  CREATININE 1.55* 1.43* 1.32* 1.23  CALCIUM 8.2* 8.5* 8.3* 8.6*  MG 2.1  --   --   --   PHOS 2.6  --   --   --    Liver Function Tests:  Recent Labs Lab 11/24/15 1344 11/26/15 0515 11/27/15 8119  AST 74* 29 25  ALT 224* 110* 93*  ALKPHOS 194* 157* 155*  BILITOT 0.1* 0.6 0.5  PROT 5.9* 5.3* 5.8*  ALBUMIN 2.5* 2.4* 2.6*   No results for input(s): LIPASE, AMYLASE in the last 168 hours. No results for input(s): AMMONIA in the last 168 hours. CBC:  Recent Labs Lab 11/24/15 0600 11/25/15 0445 11/26/15 0515 11/27/15 0312  WBC 12.8* 10.3 11.2* 11.9*  HGB 9.6* 9.2* 9.0* 9.5*  HCT 30.6* 30.0* 28.8* 30.5*  MCV 83.8 84.7 83.7 83.3  PLT 438* 424* 393 403*   Cardiac Enzymes:   No results for input(s): CKTOTAL, CKMB, CKMBINDEX, TROPONINI in the last 168  hours. BNP (last 3 results)  Recent Labs  10/08/15 2218 11/05/15 0844 11/19/15 1027  BNP 1513.6* 580.1* 348.7*    ProBNP (last 3 results) No results for input(s): PROBNP in the last 8760 hours.  CBG:  Recent Labs Lab 11/27/15 2345 11/28/15 0308 11/28/15 0749 11/28/15 1353 11/28/15 1547  GLUCAP 108* 98 94 113* 127*    No results found for this or any previous visit (from the past 240 hour(s)).   Studies: No results found.  Scheduled Meds: . antiseptic oral rinse  7 mL Mouth Rinse q12n4p  . chlorhexidine  15 mL Mouth Rinse BID    Continuous Infusions:    Time spent: 25 minutes  Hollice Espy  [facility] Pager [phone] . If 7PM-7AM, please contact night-coverage at [email or web address], password [facility] 11/30/2015, 6:27 PM  LOS: 11 days

## 2015-12-01 DIAGNOSIS — R1314 Dysphagia, pharyngoesophageal phase: Secondary | ICD-10-CM | POA: Clinically undetermined

## 2015-12-01 DIAGNOSIS — E876 Hypokalemia: Secondary | ICD-10-CM

## 2015-12-01 DIAGNOSIS — R1310 Dysphagia, unspecified: Secondary | ICD-10-CM | POA: Insufficient documentation

## 2015-12-01 NOTE — Consult Note (Signed)
[facility] Referral for Hospice Home at [facility] received. TC @ 3:15pm  from [facility],  [employer], who confirmed interest in [facility] location as a second choice. Plan: Weekend Liaison will meet with Bonita Quin to discuss referral and to evaluate the patient on Saturday, 12/02/15.  Liaison will contact [facility] tomorrow to schedule a time to meet.  Nelson Chimes RN [facility] Liaison [facility] [phone]

## 2015-12-01 NOTE — Evaluation (Signed)
Clinical/Bedside Swallow Evaluation Patient Details  Name: Jordan [name] MRN: [id number] Date of Birth: [birth year]  Today's Date: 12/01/2015 Time: SLP Start Time (ACUTE ONLY): 0914 SLP Stop Time (ACUTE ONLY): 1022 SLP Time Calculation (min) (ACUTE ONLY): 68 min  Past Medical History:  Past Medical History  Diagnosis Date  . Hypertension   . Kidney stone   . Bipolar 1 disorder (HCC)   . Diverticulitis   . Anxiety   . Arthritis   . Sleep apnea   . Chronic headaches   . Stage III chronic kidney disease     Baseline creatinine 1.8  . Left inguinal hernia   . Atherosclerosis   . CAD (coronary artery disease)     a. 10/11/2015: total occlusion of OM1 and LCx, 50% stenosis in distal LM, 80% stenosis in LAD, 99% stenosis in RCA --> CABG recommended.  . Hiatal hernia   . 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 Antritis (stomach)    Past Surgical History:  Past Surgical History  Procedure Laterality Date  . Appendectomy      70 years old  . Partial colectomy    . Spine surgery  2013  . Anterior cervical decomp/discectomy fusion N/A 12/04/2012    Procedure: ANTERIOR CERVICAL DECOMPRESSION/DISCECTOMY FUSION 2 LEVELS;  Surgeon: Temple Pacini, MD;  Location: [facility] NEURO ORS;  Service: Neurosurgery;  Laterality: N/A;  Cervical five-six, six-seven anterior cervical decompression/discectomy fusion with allograft and plating  . Esophagogastroduodenoscopy N/A 05/06/2015    Procedure: ESOPHAGOGASTRODUODENOSCOPY (EGD);  Surgeon: Vida Rigger, MD;  Location: [facility];  Service: Endoscopy;  Laterality: N/A;  . Cardiac catheterization N/A 10/11/2015    Procedure: Right/Left Heart Cath and Coronary Angiography;  Surgeon: Lyn Records, MD;  Location: [facility] INVASIVE CV LAB;  Service: Cardiovascular;  Laterality: N/A;  . Coronary artery bypass graft N/A 10/13/2015    Procedure: CORONARY ARTERY BYPASS GRAFTING (CABG) x four using left internal mammary artery and left leg greater saphenous vein harvested  endoscopically;  Surgeon: [name], MD;  Location: [facility] OR;  Service: Open Heart Surgery;  Laterality: N/A;  . Tee without cardioversion N/A 10/13/2015    Procedure: TRANSESOPHAGEAL ECHOCARDIOGRAM (TEE);  Surgeon: [name], MD;  Location: [facility] OR;  Service: Open Heart Surgery;  Laterality: N/A;  . Mitral valve replacement N/A 10/13/2015    Procedure: MITRAL VALVE (MV) REPLACEMENT;  Surgeon: [name], MD;  Location: [facility] OR;  Service: Open Heart Surgery;  Laterality: N/A;  . Tricuspid valve replacement N/A 10/13/2015    Procedure: TRICUSPID VALVE REPAIR;  Surgeon: [name], MD;  Location: [facility] OR;  Service: Open Heart Surgery;  Laterality: N/A;   HPI:  70 year old male past medical history of non-STEMI 2 months prior on chronic anticoagulation for atrial fibrillation who then unfortunately suffered intracerebral hemorrhage last month and was treated with drain placement. Patient subsequently pulled the drain out and the drain was replaced, patient remove that as well. During that admission, SLP initiated Dys 3 diet and thin liquids with good tolerance. He was discharged to nursing facility on 1/27, but returned 2 days later for atypical breathing pattern and decreased level of responsiveness. After extensive workup, patient found to have meningitis. He was treated aggressively with 10 days of antibiotics, however in that time, he has not really become too responsive. Pt started to show fluctuating alertness and family is trying to decide about GOC, therefore SLP swallow evaluation was requested.   Assessment / Plan / Recommendation Clinical Impression  Pt primarily keeps his eyes closed throughout PO trials, but does open his mouth to request and accept boluses. He has reduced awareness as he is orally accepting boluses with anterior loss noted with spoonfuls of nectar thick liquids, but once POs enter his mouth the preparation and transit occurs with more automaticity. Likely delayed  swallow results in throat clearing with ice chips, concerning for airway penetration/aspiration.   No overt s/s of aspiration occurred with spoonfuls of nectar thick liquids and solids, although reiterated to family that aspiration risk remains due to probable dysphagia from loss of functional reserve, fluctuating alertness, dependence for feeding, and reduced overall awareness. At this time they are requesting to accept the risks of aspiration to allow for a modified diet to determine how much PO intake he will be able to take in. Will initiate Dys 2 diet and nectar thick liquids by spoon. Will continue to follow for tolerance and to provide further education/assistance as able.    Aspiration Risk  Moderate aspiration risk;Risk for inadequate nutrition/hydration    Diet Recommendation Dysphagia 2 (Fine chop);Nectar-thick liquid   Liquid Administration via: Spoon Medication Administration: Crushed with puree Supervision: Full supervision/cueing for compensatory strategies;Staff to assist with self feeding Compensations: Minimize environmental distractions;Slow rate;Small sips/bites Postural Changes: Seated upright at 90 degrees    Other  Recommendations Oral Care Recommendations: Oral care BID Other Recommendations: Order thickener from pharmacy;Prohibited food (jello, ice cream, thin soups);Remove water pitcher   Follow up Recommendations   (tba)    Frequency and Duration min 2x/week  2 weeks       Prognosis Prognosis for Safe Diet Advancement: Guarded Barriers to Reach Goals: Other (Comment) (overall prognosis)      Swallow Study   General HPI: 70 year old male past medical history of non-STEMI 2 months prior on chronic anticoagulation for atrial fibrillation who then unfortunately suffered intracerebral hemorrhage last month and was treated with drain placement. Patient subsequently pulled the drain out and the drain was replaced, patient remove that as well. During that admission,  SLP initiated Dys 3 diet and thin liquids with good tolerance. He was discharged to nursing facility on 1/27, but returned 2 days later for atypical breathing pattern and decreased level of responsiveness. After extensive workup, patient found to have meningitis. He was treated aggressively with 10 days of antibiotics, however in that time, he has not really become too responsive. Pt started to show fluctuating alertness and family is trying to decide about GOC, therefore SLP swallow evaluation was requested. Type of Study: Bedside Swallow Evaluation Previous Swallow Assessment: see HPI Diet Prior to this Study: Thin liquids Temperature Spikes Noted: No Respiratory Status: Room air History of Recent Intubation: No Behavior/Cognition: Alert;Requires cueing;Other (Comment) (keeps eyes closed, quickly fatigues) Oral Cavity Assessment: Within Functional Limits Oral Care Completed by SLP: Recent completion by staff Oral Cavity - Dentition: Adequate natural dentition Vision:  (keeps eyes closed) Self-Feeding Abilities: Total assist Patient Positioning: Upright in bed Baseline Vocal Quality: Low vocal intensity    Oral/Motor/Sensory Function Overall Oral Motor/Sensory Function:  (difficult to assess formally)   Ice Chips Ice chips: Impaired Presentation: Spoon Oral Phase Impairments: Poor awareness of bolus Pharyngeal Phase Impairments: Suspected delayed Swallow;Throat Clearing - Immediate   Thin Liquid Thin Liquid: Not tested    Nectar Thick Nectar Thick Liquid: Impaired Presentation: Spoon Oral Phase Impairments: Poor awareness of bolus Oral phase functional implications: Right anterior spillage Pharyngeal Phase Impairments: Suspected delayed Swallow   Honey Thick Honey Thick Liquid: Not tested  Puree Puree: Impaired Presentation: Spoon Oral Phase Impairments: Poor awareness of bolus Oral Phase Functional Implications: Prolonged oral transit Pharyngeal Phase Impairments: Suspected  delayed Swallow   Solid   GO   Solid: Impaired Oral Phase Impairments: Impaired mastication Oral Phase Functional Implications: Prolonged oral transit       Maxcine Ham, M.A. CCC-SLP [phone]  Maxcine Ham 12/01/2015,10:38 AM

## 2015-12-01 NOTE — Progress Notes (Signed)
Palliative Care RN Note. Received voicemail from wife Linda. She states thaBonita Quinswallow study was ordered but pt is awake and hungry, and she is requesting a call back. Called her back at [phone]; she is in the room w patient and ST. Plan is for Lorinda Creed, NP to follow up later today. She verbalized understanding. Donn Pierini, RN, BSN, [facility] 12/01/2015 9:27 AM

## 2015-12-01 NOTE — Progress Notes (Signed)
PROGRESS NOTE  Brain Honeycutt [facility] ZOX:[id number] DOB: 1946-01-29 DOA: 11/19/2015 PCP: Emeterio Reeve, MD  HPI/Recap of past 24 hours:  Patient is a 70 year old male past medical history of non-STEMI 2 months prior on chronic anticoagulation for atrial fibrillation who then unfortunately suffered intracerebral hemorrhage last month and was treated with drain placement. Patient subsequently pulled the drain out and the drain was replaced, patient remove that as well. She was discharged to nursing facility on 1/27, but returned 2 days later for atypical breathing pattern and decreased level of responsiveness. After extensive workup, patient found to have meningitis. He was treated aggressively with 10 days of antibiotics, however in that time, he has not really become too responsive. After some discussion with family, patient was made comfort care. Hospice facility bed was pending.  Since 2/8, patient has waxed and waned at times becoming slightly more awake and will follow and occasional command from wife. After discussion with family, sedating medications held & pt underwent swallow eval on 2/10.  He was able to be put on a dys 2 diet. More awake today, although not following commands for me.  Non-verbal for me.  Active Problems:   Altered mental status   Obstructive hydrocephalus   Acute respiratory failure (HCC)   Bacterial meningitis   Encounter for central line placement   Meningitis   OSA (obstructive sleep apnea)   CAD in native artery   Hemorrhage, intracranial (HCC)   Hyponatremia   Hypokalemia: replacing electrolytes    Code Status: DO NOT RESUSCITATE  Family Communication: Sister at the bedside  Disposition Plan: Family meeting tomorrow at 11am for disposition plan   Consultants:  Neurology  Critical care  Neurosurgery  Infectious disease  Procedures: EEG done 1/29: Unrevealing  CVP catheter placed 2/1  Antibiotics:  Cefepime and vancomycin 1/29-2/7     Objective: BP 122/84 mmHg  Pulse 96  Temp(Src) 98.6 F (37 C) (Axillary)  Resp 18  Ht  (1.753 m)  Wt 88.5 kg (195 lb 1.7 oz)  BMI 28.80 kg/m2  SpO2 99%  Intake/Output Summary (Last 24 hours) at 12/01/15 1246 Last data filed at 12/01/15 0900  Gross per 24 hour  Intake      0 ml  Output   1550 ml  Net  -1550 ml   Filed Weights   11/27/15 0442 11/28/15 0500 11/28/15 2119  Weight: 89 kg (196 lb 3.4 oz) 89.5 kg (197 lb 5 oz) 88.5 kg (195 lb 1.7 oz)    Exam:   General:  Awake, but confusedd.    Cardiovascular: Regular rate and rhythm, S1-S2   Respiratory: Clear to auscultation bilaterally   Abdomen: Soft, distended, hypoactive bowel sounds  Musculoskeletal: No clubbing or cyanosis, trace edema  Data Reviewed: Basic Metabolic Panel:  Recent Labs Lab 11/25/15 0445 11/26/15 0515 11/27/15 0312  NA 141 144 141  K 3.6 3.4* 4.0  CL 110 114* 112*  CO2 19* 21* 19*  GLUCOSE 104* 108* 113*  BUN 33* 30* 28*  CREATININE 1.43* 1.32* 1.23  CALCIUM 8.5* 8.3* 8.6*   Liver Function Tests:  Recent Labs Lab 11/24/15 1344 11/26/15 0515 11/27/15 0312  AST 74* 29 25  ALT 224* 110* 93*  ALKPHOS 194* 157* 155*  BILITOT 0.1* 0.6 0.5  PROT 5.9* 5.3* 5.8*  ALBUMIN 2.5* 2.4* 2.6*   No results for input(s): LIPASE, AMYLASE in the last 168 hours. No results for input(s): AMMONIA in the last 168 hours. CBC:  Recent Labs Lab 11/25/15  2130 11/26/15 0515 11/27/15 0312  WBC 10.3 11.2* 11.9*  HGB 9.2* 9.0* 9.5*  HCT 30.0* 28.8* 30.5*  MCV 84.7 83.7 83.3  PLT 424* 393 403*   Cardiac Enzymes:   No results for input(s): CKTOTAL, CKMB, CKMBINDEX, TROPONINI in the last 168 hours. BNP (last 3 results)  Recent Labs  10/08/15 2218 11/05/15 0844 11/19/15 1027  BNP 1513.6* 580.1* 348.7*    ProBNP (last 3 results) No results for input(s): PROBNP in the last 8760 hours.  CBG:  Recent Labs Lab 11/27/15 2345 11/28/15 0308 11/28/15 0749 11/28/15 1353  11/28/15 1547  GLUCAP 108* 98 94 113* 127*    No results found for this or any previous visit (from the past 240 hour(s)).   Studies: No results found.  Scheduled Meds: . antiseptic oral rinse  7 mL Mouth Rinse q12n4p  . chlorhexidine  15 mL Mouth Rinse BID    Continuous Infusions:    Time spent: 15 minutes  Hollice Espy  [facility] Pager [phone] . If 7PM-7AM, please contact night-coverage at [email or web address], password [facility] 12/01/2015, 12:46 PM  LOS: 12 days

## 2015-12-01 NOTE — Progress Notes (Signed)
Daily Progress Note   Patient Name: Jordan [name] [facility]       Date: 12/01/2015 DOB: 03/15/1946  Age: 70 y.o. MRN#: [id number] Attending Physician: Hollice Espy, MD Primary Care Physician: Emeterio Reeve, MD Admit Date: 11/19/2015  Reason for Consultation/Follow-up: Establishing goals of care, symptom management   Subjective:  This NP Lorinda Creed reviewed medical records, received report from team, assessed the patient and then meet at the patient's bedside along with his wife [facility] and niece/Stacy  to discuss diagnosis, prognosis, GOC, EOL wishes disposition and options.  Family at bedside have many questions and concerns, "I just want to make sure I'm giving him every chance"  A detailed  discussion was had today regarding natural trajectory at EOL..  Concepts specific to artifical feeding and hydration, and the likelihood that patient will not be able to support himself nutritional in spite of taking a few sips and bites today for speech therapist. was had.  I detailed what a  palliative comfort care path for this patient would entail.    Values and goals of care important to patient and family were attempted to be elicited.  Bonita Quin states, "I know what he wants we talked about this".  His AD clearly supports a natural death in his current situation  Bonita Quin expresses understanding of overall poor prognosis and her main focus is comfort.  Niece Misty Stanley is having a difficult time "not doing everything", questions addressed regarding topics of euthanasia and hastening death.  She expressed her concern that this what a comfort approach "really was, we talked about it in church"  Education offered   Questions and concerns addressed.  Hard Choices booklet left for review.   Family encouraged  to call with questions or concerns.  PMT will continue to support holistically.   Length of Stay: 12 days  Current Medications: Scheduled Meds:  . antiseptic oral rinse  7 mL Mouth Rinse q12n4p  . chlorhexidine  15 mL Mouth Rinse BID    Continuous Infusions:    PRN Meds: acetaminophen **OR** acetaminophen, glycopyrrolate **OR** glycopyrrolate **OR** [DISCONTINUED] glycopyrrolate, LORazepam, morphine injection, ondansetron **OR** ondansetron (ZOFRAN) IV   Physical Exam  Constitutional: He appears lethargic. He appears ill.  HENT:  Mouth/Throat: Mucous membranes are normal. No oropharyngeal exudate.  Cardiovascular: Normal rate, regular rhythm and normal  heart sounds.   Pulmonary/Chest: He has decreased breath sounds in the right lower field and the left lower field.  Abdominal: Soft. Normal appearance and bowel sounds are normal.  Neurological: He appears lethargic.  Skin: Skin is warm and dry.                Well developed male, lethargic, sutures in his head Eyes: PER CV:  RRR Resp:  No increased work of breathing Extremities:  Moves all 4, no edema Neuro:  Mumbles incoherently, does not follow commands.    Vital Signs: BP 122/84 mmHg  Pulse 96  Temp(Src) 98.6 F (37 C) (Axillary)  Resp 18  Ht  (1.753 m)  Wt 88.5 kg (195 lb 1.7 oz)  BMI 28.80 kg/m2  SpO2 99% SpO2: SpO2: 99 % O2 Device: O2 Device: Not Delivered O2 Flow Rate: O2 Flow Rate (L/min): 8 L/min  Intake/output summary:   Intake/Output Summary (Last 24 hours) at 12/01/15 1500 Last data filed at 12/01/15 1446  Gross per 24 hour  Intake      0 ml  Output   1550 ml  Net  -1550 ml   LBM: Last BM Date: 11/28/15 Baseline Weight: Weight: 86.183 kg (190 lb) Most recent weight: Weight: 88.5 kg (195 lb 1.7 oz)       Palliative Assessment/Data: Flowsheet Rows        Most Recent Value   Intake Tab    Referral Department  Hospitalist   Unit at Time of Referral  Intermediate Care Unit   Palliative  Care Primary Diagnosis  Neurology   Date Notified  11/27/15   Palliative Care Type  New Palliative care   Reason for referral  Clarify Goals of Care, Counsel Regarding Hospice   Date of Admission  11/19/15   Date first seen by Palliative Care  11/28/15   # of days Palliative referral response time  1 Day(s)   # of days IP prior to Palliative referral  8   Clinical Assessment    Psychosocial & Spiritual Assessment    Palliative Care Outcomes       Additional Data Reviewed: CBC    Component Value Date/Time   WBC 11.9* 11/27/2015 0312   RBC 3.66* 11/27/2015 0312   RBC 3.44* 11/26/2015 0515   HGB 9.5* 11/27/2015 0312   HCT 30.5* 11/27/2015 0312   PLT 403* 11/27/2015 0312   MCV 83.3 11/27/2015 0312   MCH 26.0 11/27/2015 0312   MCHC 31.1 11/27/2015 0312   RDW 15.9* 11/27/2015 0312   LYMPHSABS 1.9 11/19/2015 1027   MONOABS 1.0 11/19/2015 1027   EOSABS 0.0 11/19/2015 1027   BASOSABS 0.0 11/19/2015 1027    CMP     Component Value Date/Time   NA 141 11/27/2015 0312   K 4.0 11/27/2015 0312   CL 112* 11/27/2015 0312   CO2 19* 11/27/2015 0312   GLUCOSE 113* 11/27/2015 0312   BUN 28* 11/27/2015 0312   CREATININE 1.23 11/27/2015 0312   CREATININE 1.96* 11/28/2012 1233   CALCIUM 8.6* 11/27/2015 0312   PROT 5.8* 11/27/2015 0312   ALBUMIN 2.6* 11/27/2015 0312   AST 25 11/27/2015 0312   ALT 93* 11/27/2015 0312   ALKPHOS 155* 11/27/2015 0312   BILITOT 0.5 11/27/2015 0312   GFRNONAA 57* 11/27/2015 0312   GFRAA >60 11/27/2015 0312       Problem List:  Patient Active Problem List   Diagnosis Date Noted  . Dysphagia, pharyngoesophageal phase 12/01/2015  . OSA (obstructive sleep  apnea)   . CAD in native artery   . Hemorrhage, intracranial (HCC)   . Hyponatremia   . Hypokalemia   . Palliative care encounter   . End of life care   . Meningitis   . Encounter for central line placement   . Acute respiratory failure (HCC)   . Bacterial meningitis   . Obstructive  hydrocephalus   . Altered mental status 11/19/2015  . Acute respiratory failure with hypoxia (HCC)   . Nontraumatic subcortical hemorrhage of left cerebral hemisphere (HCC)   . Essential hypertension   . Atrial fibrillation (HCC)   . ICH (intracerebral hemorrhage) (HCC) 11/05/2015  . Respiratory failure (HCC)   . Postoperative anemia due to acute blood loss 10/30/2015  . CAD (coronary artery disease)   . S/P CABG (coronary artery bypass graft)   . S/P CABG x 4   . Tricuspid valve regurgitation 10/13/2015  . ACS (acute coronary syndrome) (HCC)   . Acute congestive heart failure (HCC)   . Atrial fibrillation with RVR (HCC) 10/10/2015  . Demand ischemia (HCC) 10/09/2015  . Non-STEMI (non-ST elevated myocardial infarction) (HCC) 10/08/2015  . Acute diastolic (congestive) heart failure (HCC) 10/08/2015  . Stage III chronic kidney disease 05/06/2015  . Hiatal hernia 05/06/2015  . Cervical spondylosis with radiculopathy 12/04/2012  . Hyperlipidemia 11/21/2007  . Obstructive sleep apnea 11/21/2007  . Hypertensive heart disease with CHF (congestive heart failure) [facility])      Palliative Care Assessment & Plan    1.Code Status:  DNR   2. Goals of Care/Additional Recommendations:  -comfort feeds as tolerated             -utilize prn medications to enhance comfort             -comfort and dignity are priority              -hopeful for hospice facility  3. Symptom Management:      Pain/Dyspnea: Morphine 1 mg IV every 2 hrs prn      Anxiety: Ativan 0.5 mg IV every 6 hrs prn  4. Palliative Prophylaxis:  Delirium, aspiration, frequent evaluation for pain, anxiety.  5. Prognosis: < 2 weeks   6. [facility], will write for choice   Care plan was discussed with Dr Rito Ehrlich  Thank you for allowing the Palliative Medicine Team to assist in the care of this patient.   Time In: 1030 Time Out: 1130 Total Time 60 min Prolonged Time Billed  no   Lorinda Creed NP  Palliative Medicine Team Team Phone # [phone] Pager [phone]    12/01/2015, 3:00 PM  Please contact Palliative Medicine Team phone at [phone] for questions and concerns.

## 2015-12-01 NOTE — Progress Notes (Signed)
CSW followed up with Carley Hammed from [facility]. Currently there is still no beds available. CSW will continue to follow and provide support while in hospital.   Fernande Boyden, [facility] Clinical Social Worker [facility] Ph: [phone]

## 2015-12-02 DIAGNOSIS — R1314 Dysphagia, pharyngoesophageal phase: Secondary | ICD-10-CM

## 2015-12-02 MED ORDER — MORPHINE SULFATE (CONCENTRATE) 10 MG/0.5ML PO SOLN
5.0000 mg | ORAL | Status: DC | PRN
  Administered 2015-12-02: 5 mg via ORAL
  Filled 2015-12-02: qty 0.5

## 2015-12-02 NOTE — Plan of Care (Signed)
Problem: Education: Goal: Knowledge of the prescribed therapeutic regimen will improve Outcome: Completed/Met Date Met:  12/02/15 Met with wife

## 2015-12-02 NOTE — Consult Note (Signed)
[facility]--Met with family at pt's bedside today including wife-Linda, Dtr-Stacy and son-Ian.  Reviewed [facility] services.  They are interested in placement of pt at [place] [facility] for comfort care at end-of-life.  Reviewed philosophy of comfort care, team approach to care, 2 levels-of-care and financial subsidy available.  Pt has been approved for admit by [facility]. Director and family has accepted bed offer or today. Updated RN-Brenda [place] Crane and [facility] [place] of transfer plans.  Goal will be for non-emergent ambulance pick up at 12:30pm today if all d/c orders are complete.  Thank you for the opportunity to serve pt and family.  Wynetta Fines, RN

## 2015-12-02 NOTE — Discharge Summary (Signed)
Discharge Summary  Jordan [name] [facility] ZOX:[id number] DOB: 08-17-1946  PCP: Emeterio Reeve, MD  Admit date: 11/19/2015 Discharge date: 12/02/2015  Time spent: 25 minutes   Recommendations for Outpatient Follow-up:  1. Patient being discharged to hospice facility   Discharge Diagnoses:  Active Hospital Problems   Diagnosis Date Noted  . Dysphagia, pharyngoesophageal phase 12/01/2015  . Dysphagia   . OSA (obstructive sleep apnea)   . CAD in native artery   . Hemorrhage, intracranial (HCC)   . Hyponatremia   . Hypokalemia   . Palliative care encounter   . End of life care   . Meningitis   . Encounter for central line placement   . Acute respiratory failure (HCC)   . Bacterial meningitis   . Obstructive hydrocephalus   . Altered mental status 11/19/2015    Resolved Hospital Problems   Diagnosis Date Noted Date Resolved  No resolved problems to display.    Discharge Condition: Prognosis poor, Comfort Care   Diet recommendation: Comfort feeds   Filed Weights   11/27/15 0442 11/28/15 0500 11/28/15 2119  Weight: 89 kg (196 lb 3.4 oz) 89.5 kg (197 lb 5 oz) 88.5 kg (195 lb 1.7 oz)    History of present illness and Hospital course:  Patient is a 70 year old male past medical history of non-STEMI 2 months prior on chronic anticoagulation for atrial fibrillation who then unfortunately suffered intracerebral hemorrhage last month and was treated with drain placement. Patient subsequently pulled the drain out and the drain was replaced, patient remove that as well. She was discharged to nursing facility on 1/27, but returned 2 days later for atypical breathing pattern and decreased level of responsiveness. After extensive workup, patient found to have meningitis. He was treated aggressively with 10 days of antibiotics, however in that time, he has not really become too responsive. After some discussion with family, patient was made comfort care. Over the next few days, patient's mentation  waxed and waned, but after continue discussions with attending physician and palliative care team, patient's wife wanted to make sure the patient was not suffering and continued plan for hospice. Bed available on 2/11 and patient transferred  Consultants:  Neurology  Critical care  Neurosurgery  Infectious disease  Hospice and palliative care  Procedures: EEG done 1/29: Unrevealing  CVP catheter placed 2/1  Discharge Exam: BP 130/85 mmHg  Pulse 102  Temp(Src) 98.1 F (36.7 C) (Axillary)  Resp 18  Ht 5\' 9"  (1.753 m)  Wt 88.5 kg (195 lb 1.7 oz)  BMI 28.80 kg/m2  SpO2 97%  General: Somnolent Cardiovascular: Regular rate and rhythm, S1-S2  Respiratory: Clear to auscultation bilaterally   Discharge Instructions You were cared for by a hospitalist during your hospital stay. If you have any questions about your discharge medications or the care you received while you were in the hospital after you are discharged, you can call the unit and asked to speak with the hospitalist on call if the hospitalist that took care of you is not available. Once you are discharged, your primary care physician will handle any further medical issues. Please note that NO REFILLS for any discharge medications will be authorized once you are discharged, as it is imperative that you return to your primary care physician (or establish a relationship with a primary care physician if you do not have one) for your aftercare needs so that they can reassess your need for medications and monitor your lab values.  Discharge Instructions    Diet -  low sodium heart healthy    Complete by:  As directed      Increase activity slowly    Complete by:  As directed             Medication List    STOP taking these medications        amiodarone 200 MG tablet  Commonly known as:  PACERONE     atorvastatin 10 MG tablet  Commonly known as:  LIPITOR     ciprofloxacin 500 MG tablet  Commonly known as:  CIPRO       ferrous sulfate 325 (65 FE) MG tablet     furosemide 40 MG tablet  Commonly known as:  LASIX     senna-docusate 8.6-50 MG tablet  Commonly known as:  Senokot-S      TAKE these medications        LORazepam 2 MG/ML injection  Commonly known as:  ATIVAN  Inject 0.5 mLs (1 mg total) into the vein every 4 (four) hours as needed for anxiety, seizure or sedation.     morphine 2 MG/ML injection  Inject 1 mL (2 mg total) into the vein every 2 (two) hours as needed (DYSPNEA).     morphine CONCENTRATE 10 MG/0.5ML Soln concentrated solution  Take 0.25 mLs (5 mg total) by mouth every 2 (two) hours as needed for moderate pain (or dyspnea).       No Known Allergies    The results of significant diagnostics from this hospitalization (including imaging, microbiology, ancillary and laboratory) are listed below for reference.    Significant Diagnostic Studies: Ct Head Wo Contrast  11/22/2015  ADDENDUM REPORT: 11/22/2015 12:12 ADDENDUM: Study discussed by telephone with Dr. Ritta Slot on 11/22/2015 at 1208 hours. He advises that the patient did briefly have an EVD placement between today study an the exam on 11/19/2015 - the EVD was replaced but the patient then pulled it out. Therefore I feel that is the most likely explanation for the interval increased intracranial gas. CONCLUSION: Interval increased intracranial gas related to interval EVD placement and removal. Otherwise stable CT appearance of the brain since 11/19/2015. Electronically Signed   By: Odessa Fleming M.D.   On: 11/22/2015 12:12  11/22/2015  CLINICAL DATA:  70 year old male with intracranial hemorrhage, bacterial meningitis. Initial encounter. EXAM: CT HEAD WITHOUT CONTRAST TECHNIQUE: Contiguous axial images were obtained from the base of the skull through the vertex without intravenous contrast. COMPARISON:  11/19/2015 and earlier. FINDINGS: Study is mild degraded by motion artifact . Right superior approach external ventricular drain  has been removed since the study on 11/15/2015. However, a tear has been increased pneumo ventricle since that time, now moderate in the right lateral ventricle. Overall ventricle size and configuration otherwise has not significantly changed. Intraventricular extension of the left periatrial and temporal lobe hemorrhage has regressed since 11/15/2015. There is also a small volume of pneumocephalus now along the anterior right frontal lobe. No subdural or extradural blood identified. No new intracranial hemorrhage identified. Confluent hypodensity in the posterior left hemisphere in temporal lobe in a pattern most compatible with vasogenic edema has not significantly changed. Stable gray-white matter differentiation. No new cortically based infarct identified. No acute osseous abnormality identified. Stable trace ethmoid sinus mucosal thickening. Other Visualized paranasal sinuses and mastoids are clear. No acute orbit or scalp soft tissue finding identified. IMPRESSION: 1. Abnormal increasing pneumoventricle and now trace pneumocephalus since the 11/15/2015 exam on which the external ventricular drain had already been removed. The appearance  is suspicious for progressive gas-forming infection. 2. Otherwise stable appearance of left hemisphere edema and intra-axial hemorrhage with intraventricular extension. No acute ventriculomegaly. Electronically Signed: By: Odessa Fleming M.D. On: 11/22/2015 12:02   Ct Head Wo Contrast  11/19/2015  CLINICAL DATA:  Intra cerebral hemorrhage, follow-up EXAM: CT HEAD WITHOUT CONTRAST TECHNIQUE: Contiguous axial images were obtained from the base of the skull through the vertex without intravenous contrast. COMPARISON:  11/15/2015 FINDINGS: Left intra cerebral hemorrhage in the left parietal region again noted measuring 4.4 x 2.8 cm compared to 4.9 x 2.4 cm previously, likely not significantly changed. Surrounding edema is stable. No evidence of new or progressive hemorrhage.  Intraventricular hemorrhage in the left lateral ventricle is again noted. Evolutionary changes. Time mildly increased prominence of the ventricles. Chronic microvascular disease throughout the deep white matter. 3 mm of left-to-right midline shift. No acute calvarial abnormality. IMPRESSION: Left parietal intraparenchymal hemorrhage not significantly changed. Stable surrounding edema. Intraventricular extension again noted. The ventricles are more prominent on today's study suggesting developing hydrocephalus. Slight left-to-right midline shift, 3 mm. Electronically Signed   By: Charlett Nose M.D.   On: 11/19/2015 13:05   Ct Head Wo Contrast  11/15/2015  CLINICAL DATA:  Increased confusion, decreased responsiveness this evening after having IVC removed. EXAM: CT HEAD WITHOUT CONTRAST TECHNIQUE: Contiguous axial images were obtained from the base of the skull through the vertex without intravenous contrast. COMPARISON:  11/10/2015 FINDINGS: Acute left temporoparietal intraparenchymal hematoma measuring 2.4 x 4.9 cm. Surrounding edema in the deep white matter. Intraventricular hemorrhage demonstrated in the left lateral ventricle and third ventricle. Hematomas are becoming less conspicuous than on previous study, likely due to normal evolution. No evidence of progression of hemorrhage. Postsurgical changes in the right anterior frontal region corresponding to the previous intraventricular catheter, removed since prior study. Small amount of subdural and intraventricular gas likely resulting from previous catheterization and less prominent than on previous study. Mild residual ventricular dilatation, somewhat less prominent than on prior study. There is residual sulcal effacement on the left with about 4 mm residual left-to-right midline shift. Underlying diffuse cerebral atrophy. Low-attenuation changes in the white matter consistent with small vessel ischemia. Basal cisterns are not effaced. No abnormal extra-axial  fluid collections. IMPRESSION: Left temporoparietal intraventricular hematoma with surrounding white matter edema. Intraventricular hematoma. Intraventricular and subdural gas. Hematomas are becoming less distinct since previous study and ventricular dilatation and intraventricular gas is decreasing. Interval removal of intraventricular catheter. Mild residual left-to-right midline shift of about 4 mm. Electronically Signed   By: Burman Nieves M.D.   On: 11/15/2015 21:51   Ct Head Wo Contrast  11/10/2015  CLINICAL DATA:  Fall.  Intracranial hemorrhage.  Ventricular drain. EXAM: CT HEAD WITHOUT CONTRAST TECHNIQUE: Contiguous axial images were obtained from the base of the skull through the vertex without intravenous contrast. COMPARISON:  CT head 11/09/2015 FINDINGS: Left parietal parenchymal hematoma unchanged measuring approximately 3.5 x 3.0 cm. There is ventricular extension left greater than right which is unchanged. Ventricular drain in the right frontal horn unchanged. Gas in the frontal horns bilaterally has increased since yesterday. Ventricular size has increased from yesterday however there is no significant hydrocephalus. Ventricles were slit-like yesterday. No new area of hemorrhage since yesterday. There is considerable edema in the left parietal lobe around the hemorrhage. This is unchanged. Mild shift of the midline structures to the right IMPRESSION: Left parietal hemorrhage with intraventricular extension is unchanged. No new area of hemorrhage. Ventricular size is slightly larger than  yesterday with increased gas the ventricles. No significant hydrocephalus. Electronically Signed   By: Marlan Palau M.D.   On: 11/10/2015 14:56   Ct Head Wo Contrast  11/09/2015  CLINICAL DATA:  Followup intraventricular hemorrhage. EXAM: CT HEAD WITHOUT CONTRAST TECHNIQUE: Contiguous axial images were obtained from the base of the skull through the vertex without intravenous contrast. COMPARISON:  CT head  November 07, 2015 FINDINGS: Evolving LEFT temporal parietal 3.1 x 3.1 cm intraparenchymal hematoma and surrounding vasogenic edema, relatively unchanged in appearance with intraventricular extension. Stable position of RIGHT frontal ventriculostomy catheter with distal tip at the foramen of Monro. Anterior recess of the third ventricle is 10 mm, relatively unchanged. Decreasing LEFT ventricular entrapment, previously 18 mm, now 14 mm. Small amount of RIGHT temporal and RIGHT frontal horn pneumocephalus. Persistent LEFT periventricular transependymal flow cerebral spinal fluid. Similar 5 mm LEFT-to-RIGHT midline shift. Mild LEFT uncal herniation. No acute large vascular territory infarct. Ocular globes and orbital contents are normal. Trace paranasal sinus mucosal thickening without air-fluid levels. The mastoid air cells are well aerated. RIGHT frontal burr hole, no skull fracture. IMPRESSION: Evolving LEFT cerebral intraparenchymal hematoma with intraventricular extension. Stable position of RIGHT frontal ventriculostomy catheter, similar mild hydrocephalus, decreasing LEFT ventricular entrapment. Similar 5 mm LEFT-to-RIGHT midline shift, and mild LEFT uncal herniation. Electronically Signed   By: Awilda Metro M.D.   On: 11/09/2015 03:27   Ct Head Wo Contrast  11/07/2015  CLINICAL DATA:  Followup cerebral hemorrhage/ stroke. History of hypertension. EXAM: CT HEAD WITHOUT CONTRAST TECHNIQUE: Contiguous axial images were obtained from the base of the skull through the vertex without intravenous contrast. COMPARISON:  CT head November 06, 2015 FINDINGS: Evolving LEFT periatrial hematoma measuring 2.8 x 3.4 cm, previously 2.7 x 3.8 cm. Intraventricular extension, with diffuse intraventricular hemorrhage. Anterior recess of third ventricle is 10 mm, previously 12 mm. Lateral dimension LEFT temporal horn is 18 mm, previously 21 mm. RIGHT frontal ventriculostomy catheter with distal tip at the level of the foramen  of Monro is/ anterior recess. Slightly decreased transependymal flow/ interstitial edema about the LEFT ventricle. 5 mm LEFT-to-RIGHT midline shift, previously 7 mm. Mild LEFT uncal herniation. Basal cisterns are patent. Trace residual RIGHT frontal extra-axial pneumocephalus. Moderate calcific atherosclerosis the carotid siphons. Ocular globes and orbital contents are normal. Paranasal sinuses and mastoid air cells are well aerated. No skull fracture. IMPRESSION: Evolving LEFT cerebral hematoma with intraventricular extension. Stable position of RIGHT frontal ventriculostomy catheter with resolving hydrocephalus, decreasing LEFT ventricular entrapment. 5 mm LEFT-to-RIGHT midline shift, improved. Electronically Signed   By: Awilda Metro M.D.   On: 11/07/2015 04:59   Ct Head Wo Contrast  11/06/2015  CLINICAL DATA:  Follow-up. History of hypertension, chronic kidney disease and stroke. EXAM: CT HEAD WITHOUT CONTRAST TECHNIQUE: Contiguous axial images were obtained from the base of the skull through the vertex without intravenous contrast. COMPARISON:  CT head November 05, 2015 FINDINGS: 3.8 x 2.7 cm evolving LEFT frontoparietal hematoma with intraventricular extension. Blood products distend the LEFT temporal horn with transependymal flow cerebral spinal fluid. Additional blood products in the RIGHT lateral ventricle, third and fourth ventricle. Interval placement of RIGHT frontal ventriculostomy catheter with distal tip at the level of the foramen of Monro. The anterior recess of the third ventricle was 2 cm, now 1.2 cm. 7 mm LEFT-to-RIGHT midline shift increased from prior examination, with new small amount of extra-axial pneumocephalus on the RIGHT. Basal cisterns are patent. Moderate calcific atherosclerosis of the carotid siphons. Ocular globes and  orbital contents are normal. Minimal paranasal sinus mucosal thickening without air-fluid levels. The mastoid air cells are well aerated. No skull fracture. New  RIGHT frontal burr hole. IMPRESSION: Evolving LEFT intraparenchymal hematoma with intraventricular extension. Interval placement of RIGHT frontal ventriculostomy catheter with resolving hydrocephalus, however there is persistent LEFT temporal horn entrapment. 7 mm LEFT-to-RIGHT midline shift is likely postprocedural. New small amount of RIGHT frontal extra-axial pneumocephalus. Electronically Signed   By: Awilda Metro M.D.   On: 11/06/2015 05:40   Ct Head Wo Contrast  11/05/2015  CLINICAL DATA:  Intracranial hemorrhage EXAM: CT HEAD WITHOUT CONTRAST TECHNIQUE: Contiguous axial images were obtained from the base of the skull through the vertex without intravenous contrast. COMPARISON:  Head CT 11/05/2015 at 827 hours FINDINGS: Large intraventricular hemorrhage predominantly on the LEFT. The high-density hemorrhage within the LEFT lateral ventricle/atria is increased in volume measuring 3.7 x 3.3 cm (image 17, series 3) compared to 2.7 by 2.4 cm. There is increased edema surrounding the LEFT ventricle atria. The volume of blood within the temporal horn is slightly increased measuring 20 mm compares 17mm. There is no significant midline shift. There is hemorrhage within the third ventricle which is not changed in volume. Small amount hemorrhage in the bilateral anterior ventricles is not changed. Additionally there is a blood within the fourth ventricle which is unchanged in volume. No parenchymal hemorrhage identified. No extra-axial fluid collections. Patent basilar cisterns. IMPRESSION: 1. Increase in volume of hemorrhage within the LEFT lateral ventricle anomaly within the LEFT atria and to lesser degree the temporal horn. 2. Increase in parenchymal edema surrounding the LEFT lateral ventricle hemorrhage. 3. No midline shift.  Basilar cisterns are patent. 4. No change in volume of hemorrhage within the third ventricle and fourth ventricle and anterior ventricles. Findings conveyed toJOSEPH ZAMMIT on  11/05/2015  at10:59. Electronically Signed   By: Genevive Bi M.D.   On: 11/05/2015 11:02   Ct Head Wo Contrast  11/05/2015  CLINICAL DATA:  Recent CABG with nausea and vomiting. Unable to follow commands. EXAM: CT HEAD WITHOUT CONTRAST TECHNIQUE: Contiguous axial images were obtained from the base of the skull through the vertex without intravenous contrast. COMPARISON:  07/28/2015 FINDINGS: Motion artifact over the superior images. Examination demonstrates an acute parenchymal hemorrhage abutting the atria of the left lateral ventricle measuring approximately 1.6 x 2.6 cm with mild adjacent edema. Moderate acute hemorrhage filling the left lateral ventricle and extending into the right lateral ventricle, third ventricle and fourth ventricles. Ventricles are within normal in size although minimally expanded compared to the prior exam. There is no evidence of midline shift. There is evidence of chronic ischemic microvascular disease. Small old lacunar infarct over the left lentiform nucleus. Remaining bones and soft tissues are within normal. IMPRESSION: New acute parenchymal hemorrhage abutting the atria of the left lateral ventricle measuring approximate 1.6 x 2.6 cm with mild adjacent edema. Hemorrhage extends into the ventricular system as described. Chronic ischemic microvascular disease. Small old left-sided lacunar infarct. Critical Value/emergent results were called by telephone at the time of interpretation on 11/05/2015 at 8:45 am to Dr. Bethann Berkshire , who verbally acknowledged these results. Electronically Signed   By: Elberta Fortis M.D.   On: 11/05/2015 08:45   Dg Chest Port 1 View  11/24/2015  CLINICAL DATA:  Acute respiratory failure EXAM: PORTABLE CHEST 1 VIEW COMPARISON:  11/23/2015 FINDINGS: Very low lung volumes with central vascular congestion and basilar atelectasis. Postop changes of the heart. Feeding tube extends into the  proximal duodenum with the tip not visualized. No large effusion  or pneumothorax. Left subclavian central line tip at the innominate SVC junction. IMPRESSION: Very low lung volumes with central vascular congestion and basilar atelectasis. Electronically Signed   By: Judie Petit.  Shick M.D.   On: 11/24/2015 07:46   Dg Chest Port 1 View  11/23/2015  CLINICAL DATA:  70 year old male with acute respiratory failure. Subsequent encounter. EXAM: PORTABLE CHEST 1 VIEW COMPARISON:  11/22/2015. FINDINGS: Post CABG and valve replacement. Heart size within normal limits. Tortuous aorta. Right central line tip proximal superior vena cava level without pneumothorax. Central pulmonary vascular prominence without segmental consolidation. Poor inspiration with elevated hemidiaphragms. Post cervical spine surgery. IMPRESSION: No significant change. Poor inspiratory exam with mild central pulmonary vascular prominence. Postoperative changes as noted above. Interval placement of feeding tube which courses below diaphragm. Tip not imaged on the current exam. Electronically Signed   By: Lacy Duverney M.D.   On: 11/23/2015 07:51   Dg Chest Port 1 View  11/22/2015  CLINICAL DATA:  Acute respiratory failure, meningitis, central line placement. EXAM: PORTABLE CHEST 1 VIEW COMPARISON:  Portable chest x-ray of November 22, 2015 at 1:12 a.m. FINDINGS: The lungs remain mildly hypoinflated. There is no pneumothorax or pleural effusion. There is no alveolar infiltrate. There are mildly increased lung markings at the left lung base. The heart is top-normal in size. The pulmonary vascularity is normal. There are post median sternotomy changes with evidence of previous mitral and tricuspid valve replacement. The left internal jugular venous catheter tip projects over the proximal SVC. IMPRESSION: Persistent hypo inflation with minimal subsegmental atelectasis at the left lung base. There is no evidence of pulmonary edema or alveolar pneumonia. Electronically Signed   By: David  [place] M.D.   On: 11/22/2015 07:41   Dg  Chest Port 1 View  11/22/2015  CLINICAL DATA:  Central line placement EXAM: PORTABLE CHEST 1 VIEW COMPARISON:  Yesterday FINDINGS: New left IJ central line with tip at the SVC origin. No pneumothorax. Low lung volumes with interstitial crowding/atelectasis. No effusion or edema. Chronic cardiomegaly. The patient is status post CABG and mitral/tricuspid valve replacement. IMPRESSION: 1. No adverse finding after central line placement. 2. Low volumes with basilar atelectasis. Electronically Signed   By: Marnee Spring M.D.   On: 11/22/2015 01:46   Dg Chest Port 1 View  11/21/2015  CLINICAL DATA:  Acute respiratory failure. EXAM: PORTABLE CHEST 1 VIEW COMPARISON:  11/20/2015 FINDINGS: Postsurgical changes from prior median sternotomy, CABG and valvular replacement are stable. Cervical spine fusion is also seen. Cardiomediastinal silhouette is normal. Mediastinal contours appear intact. There is no evidence of focal airspace consolidation, pleural effusion or pneumothorax. Lung volumes are low. Osseous structures are without acute abnormality. Soft tissues are grossly normal. IMPRESSION: Low lung volumes with bibasilar atelectasis. Stable postsurgical changes. Electronically Signed   By: Ted Mcalpine M.D.   On: 11/21/2015 07:33   Dg Chest Port 1 View  11/20/2015  CLINICAL DATA:  Respiratory failure. EXAM: PORTABLE CHEST 1 VIEW COMPARISON:  11/19/2015. FINDINGS: Prior CABG and cardiac valve replacement. Stable heart size. No pulmonary venous congestion. Low lung volumes with mild bibasilar atelectasis and/or infiltrates. No pleural effusion or pneumothorax. Prior cervical spine fusion . IMPRESSION: 1. Prior CABG. Prior cardiac valve replacement. Heart size is stable. 2. Low lung volumes with mild bibasilar atelectasis and/or infiltrates. No interim change. Electronically Signed   By: Maisie Fus  Register   On: 11/20/2015 07:20   Dg Chest Port 1 [phone]  11/19/2015  CLINICAL DATA:  70 year old nursing home  patient with acute onset of shortness of breath and fever which began earlier today. Recent hospitalization for intracranial hemorrhage, presumably hemorrhagic stroke, for which he was discharged 2 days ago. EXAM: PORTABLE CHEST 1 VIEW COMPARISON:  11/11/2015 and earlier. FINDINGS: Suboptimal inspiration accounts for crowded bronchovascular markings diffusely and atelectasis in the bases, and accentuates the cardiac silhouette. Taking this into account, cardiac silhouette upper normal in size to slightly enlarged but stable. Prior CABG and aortic valve replacement. Lungs otherwise clear. No localized airspace consolidation. No pleural effusions. No pneumothorax. Normal pulmonary vascularity. IMPRESSION: Suboptimal inspiration accounts for mild bibasilar atelectasis. No acute cardiopulmonary disease otherwise. Electronically Signed   By: Hulan Saas M.D.   On: 11/19/2015 10:48   Dg Chest Port 1 View  11/11/2015  CLINICAL DATA:  Follow-up infiltrate EXAM: PORTABLE CHEST 1 VIEW COMPARISON:  11/07/2015 FINDINGS: Cardiomediastinal silhouette is stable. Status post median sternotomy. Metallic fixation plate cervical spine again noted. Endotracheal and NG tube has been removed. Improvement in aeration left base. Minimal residual streaky atelectasis or scarring. No new infiltrate or pulmonary edema. IMPRESSION: Endotracheal tube and NG tube has been removed. Improvement in aeration left base. Minimal residual streaky atelectasis or scarring. No new infiltrate or pulmonary edema. Electronically Signed   By: Natasha Mead M.D.   On: 11/11/2015 11:44   Dg Chest Port 1 View  11/07/2015  CLINICAL DATA:  Intubation. EXAM: PORTABLE CHEST 1 VIEW COMPARISON:  11/06/2015 . FINDINGS: Endotracheal tube and NG tube in stable position. Prior CABG. Stable cardiomegaly. Low lung volumes with basilar atelectasis. Left lower lobe infiltrate noted. No pleural effusion or pneumothorax. Prior cervical spine fusion. IMPRESSION: 1. Lines  and tubes in stable position. 2. Prior CABG.  Stable cardiomegaly.  No pulmonary venous congestion 3. Low volumes with persistent bibasilar atelectasis. Persistent left lower lobe infiltrate noted. Electronically Signed   By: Maisie Fus  Register   On: 11/07/2015 07:45   Dg Chest Port 1 View  11/06/2015  CLINICAL DATA:  Verify endotracheal tube placement EXAM: PORTABLE CHEST 1 VIEW COMPARISON:  11/06/2015 FINDINGS: Endotracheal tube tip 2 cm above the carina. NG tube crosses the gastroesophageal junction with side hole of the catheter just below the diaphragm. Replacement cardiac valve noted. Left internal jugular central line unchanged with tip over the superior vena cava. Bilateral hypoventilatory change. IMPRESSION: Endotracheal tube as described. Electronically Signed   By: Esperanza Heir M.D.   On: 11/06/2015 21:05   Dg Chest Port 1 View  11/06/2015  CLINICAL DATA:  Respiratory failure. EXAM: PORTABLE CHEST 1 VIEW COMPARISON:  11/05/2015 . FINDINGS: Endotracheal tube, left IJ line, NG tube in stable position. Prior cardiac valve replacement. Heart size stable. No pulmonary vascular congestion on today's exam. Improving left lower lobe infiltrate/edema. Low lung volumes. Small left pleural effusion cannot be excluded. No pneumothorax. Prior cervical spine fusion. IMPRESSION: 1. Lines and tubes in stable position. 2. Heart size stable. No pulmonary venous congestion on today's exam. 3. Persistent but improving left lower lobe infiltrate/edema. Low lung volumes. Small left pleural effusion cannot be excluded. Electronically Signed   By: Maisie Fus  Register   On: 11/06/2015 07:23   Dg Chest Port 1 View  11/05/2015  CLINICAL DATA:  Left IJ central venous catheter. EXAM: PORTABLE CHEST 1 VIEW COMPARISON:  11/05/2015 at 10:49 a.m. FINDINGS: Enteric tube is coiled once over the stomach with tip over the mid gastric body. Endotracheal tube unchanged with tip approximately 4.2 cm above the  carina. There has been  interval placement of a left IJ central venous catheter with tip obliquely warranted over the SVC just above the level of the carina. Patient is slightly rotated to the right. Lungs are hypoinflated with continued mild prominence of the perihilar markings suggesting minimal vascular congestion. No effusion or pneumothorax. Cardiomediastinal silhouette and remainder of the exam is unchanged. IMPRESSION: Hypoinflation with findings suggesting mild vascular congestion. Tubes and lines as described. Interval placement left IJ central venous catheter with tip over the SVC. No pneumothorax. Electronically Signed   By: Elberta Fortis M.D.   On: 11/05/2015 13:46   Dg Chest Portable 1 View  11/05/2015  CLINICAL DATA:  Intracerebral hemorrhage. Acute respiratory failure. Intubation. EXAM: PORTABLE CHEST 1 VIEW COMPARISON:  11/05/2015 FINDINGS: New endotracheal tube and nasogastric tube are seen in appropriate position. Improved aeration of both lungs is seen with decreased bibasilar atelectasis. No evidence of pulmonary consolidation or pleural effusion. 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 IMPRESSION: Endotracheal tube and nasogastric tube in appropriate position. Improved aeration with decreased bibasilar atelectasis. Electronically Signed   By: Myles Rosenthal M.D.   On: 11/05/2015 11:51   Dg Chest Port 1 View  11/05/2015  CLINICAL DATA:  Nausea and vomiting. EXAM: PORTABLE CHEST 1 VIEW COMPARISON:  10/24/2015 FINDINGS: Sternotomy wires and prosthetic heart valve unchanged. Lungs are hypoinflated with minimal prominence of the perihilar markings suggesting mild vascular congestion. Mild opacification over the medial left base which may represent effusion and atelectasis although cannot exclude infection. Cardiomediastinal silhouette and remainder of the exam is unchanged. IMPRESSION: Hypo inflation with mild opacification of the medial left base which may be due to  effusions/atelectasis although cannot exclude infection. Suggestion of minimal vascular congestion. Electronically Signed   By: Elberta Fortis M.D.   On: 11/05/2015 07:53   Dg Abd Portable 1v  11/22/2015  CLINICAL DATA:  Encounter for feeding tube placement EXAM: PORTABLE ABDOMEN - 1 VIEW COMPARISON:  11/06/2015 FINDINGS: The tip of the feeding tube is in the expected location of the ligament of Treitz. Normal bowel gas pattern. IMPRESSION: 1. Feeding tube tip is at the level of the ligament of Treitz. Electronically Signed   By: Signa Kell M.D.   On: 11/22/2015 16:57   Dg Abd Portable 1v  11/06/2015  CLINICAL DATA:  Verify a OG tube placement EXAM: PORTABLE ABDOMEN - 1 VIEW COMPARISON:  None FINDINGS: Orogastric tube crosses the gastroesophageal junction with side hole of the catheter at the gastroesophageal junction and tip of the tube into the stomach by approximately 3-4 cm. Nonobstructive gas pattern. IMPRESSION: Orogastric tube just crosses into the stomach and should be advanced. Electronically Signed   By: Esperanza Heir M.D.   On: 11/06/2015 21:13    Microbiology: No results found for this or any previous visit (from the past 240 hour(s)).   Labs: Basic Metabolic Panel:  Recent Labs Lab 11/26/15 0515 11/27/15 0312  NA 144 141  K 3.4* 4.0  CL 114* 112*  CO2 21* 19*  GLUCOSE 108* 113*  BUN 30* 28*  CREATININE 1.32* 1.23  CALCIUM 8.3* 8.6*   Liver Function Tests:  Recent Labs Lab 11/26/15 0515 11/27/15 0312  AST 29 25  ALT 110* 93*  ALKPHOS 157* 155*  BILITOT 0.6 0.5  PROT 5.3* 5.8*  ALBUMIN 2.4* 2.6*   No results for input(s): LIPASE, AMYLASE in the last 168 hours. No results for input(s): AMMONIA in the last 168 hours.  CBC:  Recent Labs Lab 11/26/15 0515 11/27/15 0312  WBC 11.2* 11.9*  HGB 9.0* 9.5*  HCT 28.8* 30.5*  MCV 83.7 83.3  PLT 393 403*   Cardiac Enzymes: No results for input(s): CKTOTAL, CKMB, CKMBINDEX, TROPONINI in the last 168  hours. BNP: BNP (last 3 results)  Recent Labs  10/08/15 2218 11/05/15 0844 11/19/15 1027  BNP 1513.6* 580.1* 348.7*    ProBNP (last 3 results) No results for input(s): PROBNP in the last 8760 hours.  CBG:  Recent Labs Lab 11/27/15 2345 11/28/15 0308 11/28/15 0749 11/28/15 1353 11/28/15 1547  GLUCAP 108* 98 94 113* 127*       Signed:  Lakiyah Arntson K  [facility] 12/02/2015, 12:11 PM

## 2015-12-02 NOTE — Progress Notes (Signed)
Report called to Ramos at [facility] of the [place].  Discharge packet including gold DNR form in packet. Rhonda requested to leave saline lock in and also leave condom cath on patient. Patient was medicated for pain at 13:06 and transportation has been arranged with [facility]. Family at bedside.

## 2015-12-02 NOTE — Progress Notes (Signed)
[facility] has accepted Pt after meeting with family and requests that transportation be arranged for 1230.  Notified MD.    RN to give report.  Arranged for ambulance transport.  Pt to be d/c'd.  Providence Crosby, LCSW Clinical Social Work [phone]

## 2015-12-02 NOTE — Progress Notes (Signed)
[facility] here to transport patient 

## 2015-12-18 LAB — FUNGUS CULTURE W SMEAR: FUNGAL SMEAR: NONE SEEN

## 2015-12-20 DEATH — deceased

## 2015-12-24 ENCOUNTER — Other Ambulatory Visit: Payer: Self-pay | Admitting: Adult Health

## 2016-01-25 ENCOUNTER — Telehealth (HOSPITAL_COMMUNITY): Payer: Self-pay

## 2016-01-25 NOTE — Telephone Encounter (Signed)
Attempted to contact patient but was unable to get through ( get dial tone, call cannot be completed as dial message) x 4

## 2016-11-28 IMAGING — CR DG CHEST 1V PORT
1 series · 1 of 1 positions shown · non-contrast
Comparison: 10/14/2015

CLINICAL DATA: Post CABG

EXAM:
PORTABLE CHEST 1 VIEW

[AP]
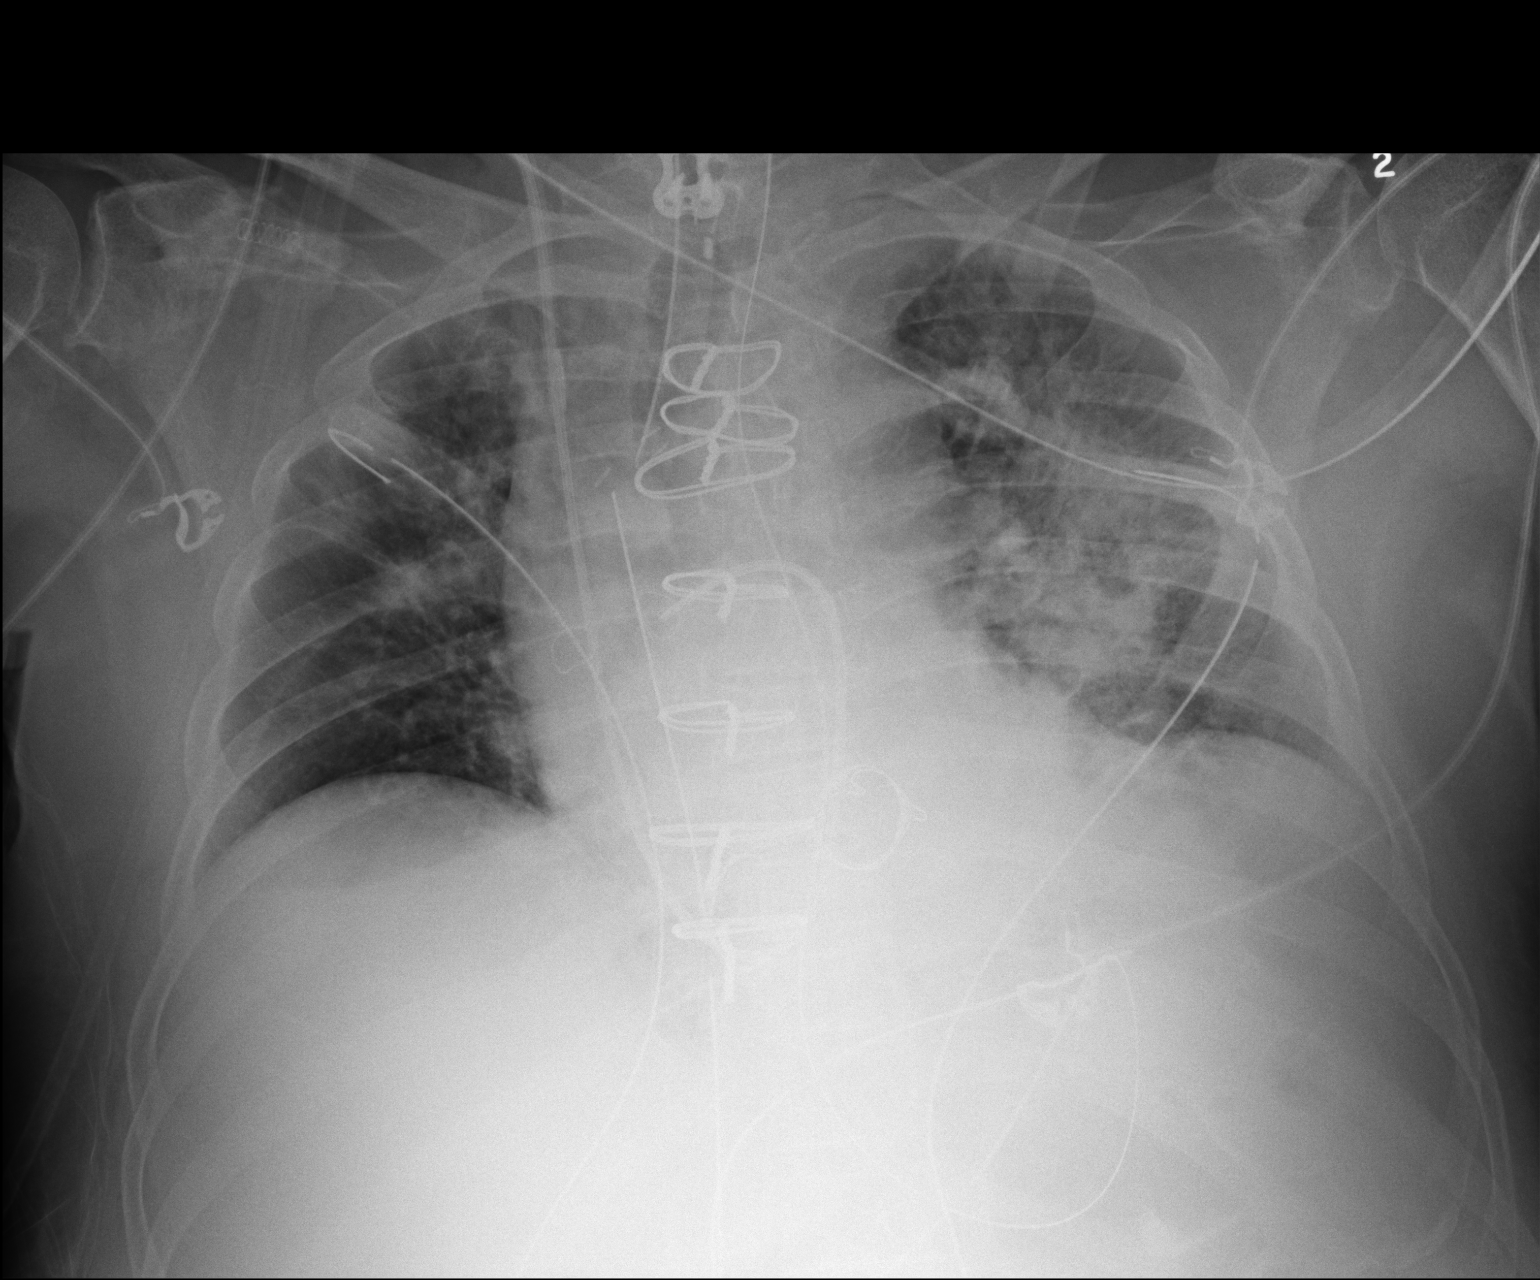

[1 of 1 positions shown; findings below may reference images not displayed]

FINDINGS: Cardiomediastinal silhouette is stable. Again noted status post
CABG. Stable endotracheal tube position with tip 2.6 cm above the
carina. Stable NG tube position. Right IJ Swan-Ganz catheter is
stable in position. Bilateral chest tubes are unchanged in position.
Again noted patchy consolidation left upper lobe and perihilar.
Stable patchy infiltrate in right upper lobe. Mediastinal drain is
unchanged in position. There is no pneumothorax. Stable cardiac
valve prosthesis.
IMPRESSION: Stable support apparatus. Again noted patchy consolidations/airspace
is in left lung and right upper lobe. Post CABG.

## 2016-11-29 IMAGING — CR DG CHEST 1V PORT
1 series · 1 of 1 positions shown · non-contrast
Comparison: Prior chest x-ray 10/14/2015

CLINICAL DATA: 69-year-old male with coronary artery disease status
post CABG

EXAM:
PORTABLE CHEST 1 VIEW

[AP]
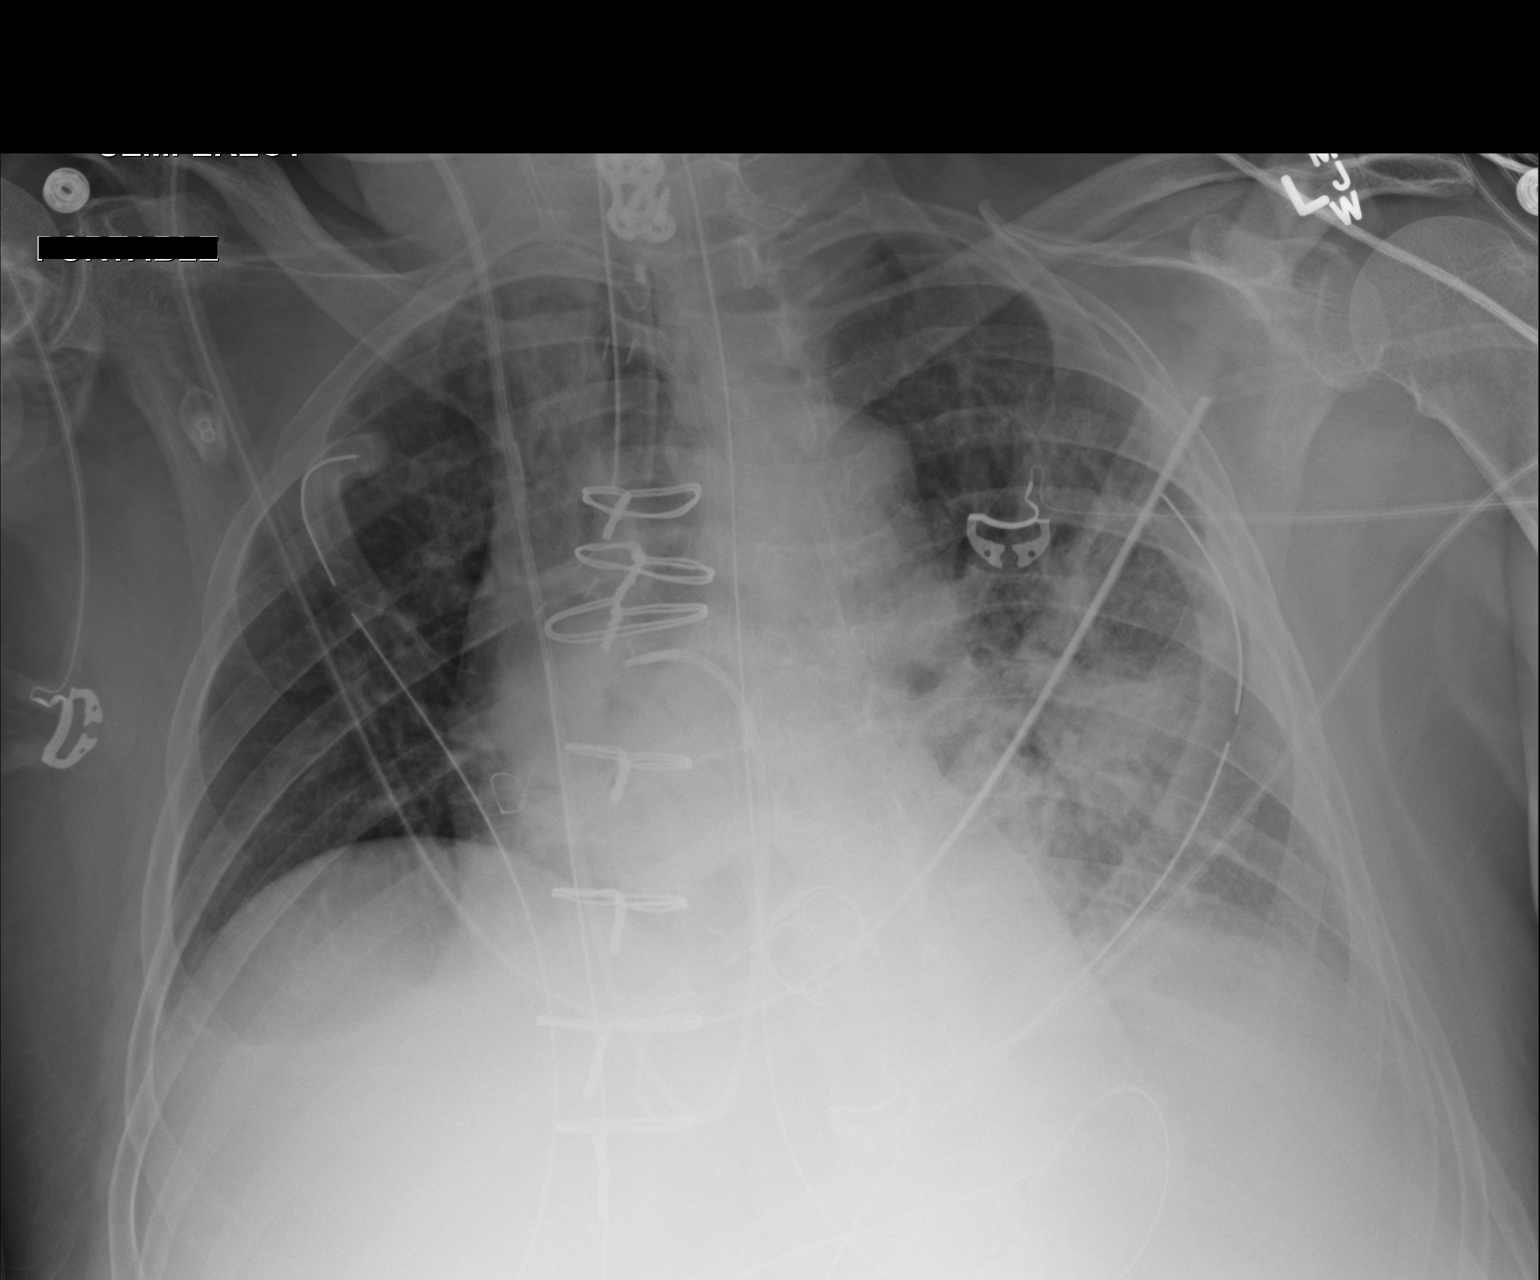

[1 of 1 positions shown; findings below may reference images not displayed]

FINDINGS: The endotracheal tube is 3.5 cm above the carina. A right IJ
vascular sheath convey is a Swan-Ganz catheter into the heart. Tip
of the [place]ai overlies the right main pulmonary artery. Mediastinal
and bilateral pleural drains remain present. Patient is status post
median sternotomy with evidence of aortic valve replacement.

Slightly improved inspiratory volumes. Persistent atelectasis versus
infiltrate in the left lower lobe. No pneumothorax or pulmonary
edema. No acute osseous abnormality.
IMPRESSION: 1. Stable and satisfactory support apparatus.
2. Improved inspiratory volumes.
3. Persistent patchy airspace opacity in the left lower lobe likely
reflecting atelectasis.
4. No evidence of pneumothorax or pulmonary edema.

## 2016-12-01 IMAGING — CR DG CHEST 1V PORT
1 series · 1 of 1 positions shown · non-contrast
Comparison: 10/16/2015.

CLINICAL DATA: CABG.

EXAM:
PORTABLE CHEST 1 VIEW

[AP]
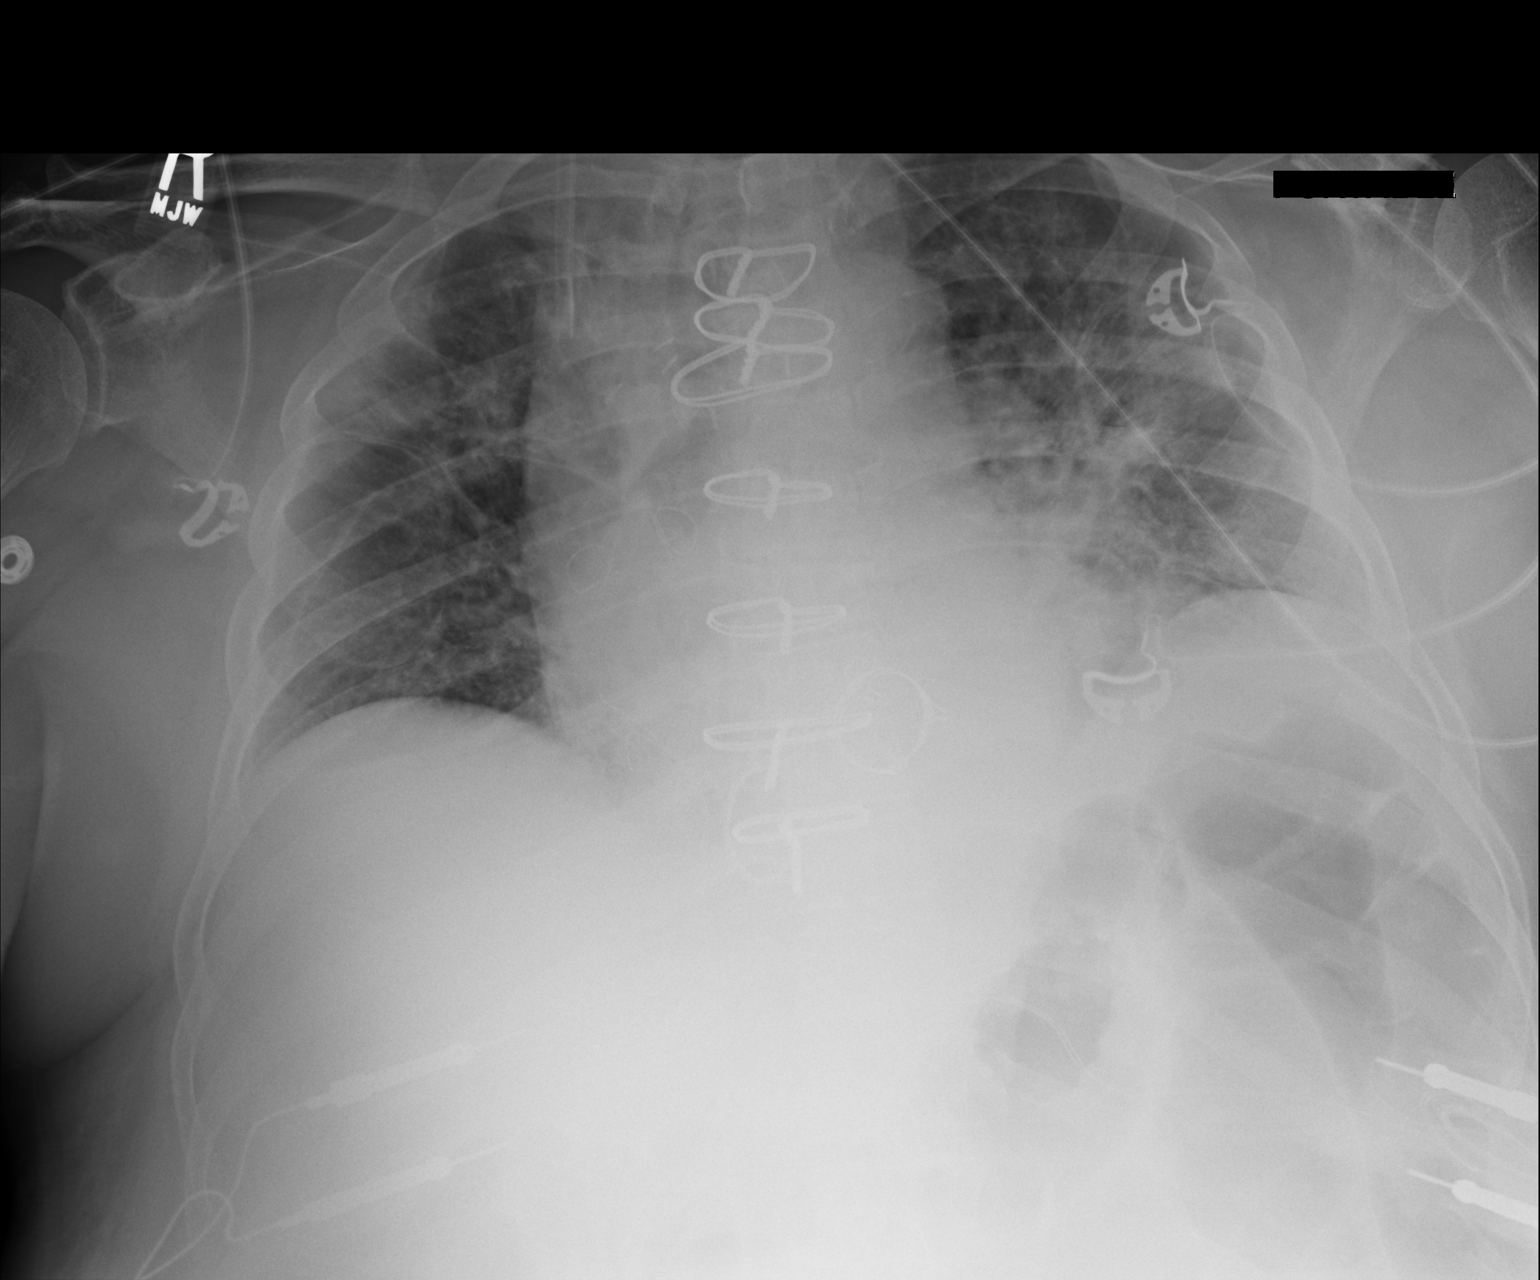

[1 of 1 positions shown; findings below may reference images not displayed]

FINDINGS: Right IJ sheath in stable position. Interim removal of right and
left chest tubes, mediastinal drainage catheter, and Swan-Ganz
catheter. Prior CABG and cardiac valve replacement. Prior CABG and
cardiac valve replacement. Stable cardiomegaly. Interim improvement
of left mid lung field atelectasis and/or infiltrate. Persistent low
lung volumes. No pleural effusion or pneumothorax .
IMPRESSION: 1. Interim removal of right and left chest tubes, mediastinal
drainage catheter, and Swan-Ganz catheter. Right IJ sheath in stable
position.
2. Prior CABG and cardiac valve replacement.  Stable cardiomegaly.
3. Interim improvement of left mid lung field atelectasis and/or
infiltrate.

## 2016-12-21 IMAGING — CR DG ABD PORTABLE 1V
1 series · 1 of 1 positions shown · non-contrast
Comparison: None

CLINICAL DATA: Verify a OG tube placement

EXAM:
PORTABLE ABDOMEN - 1 VIEW

[AP]
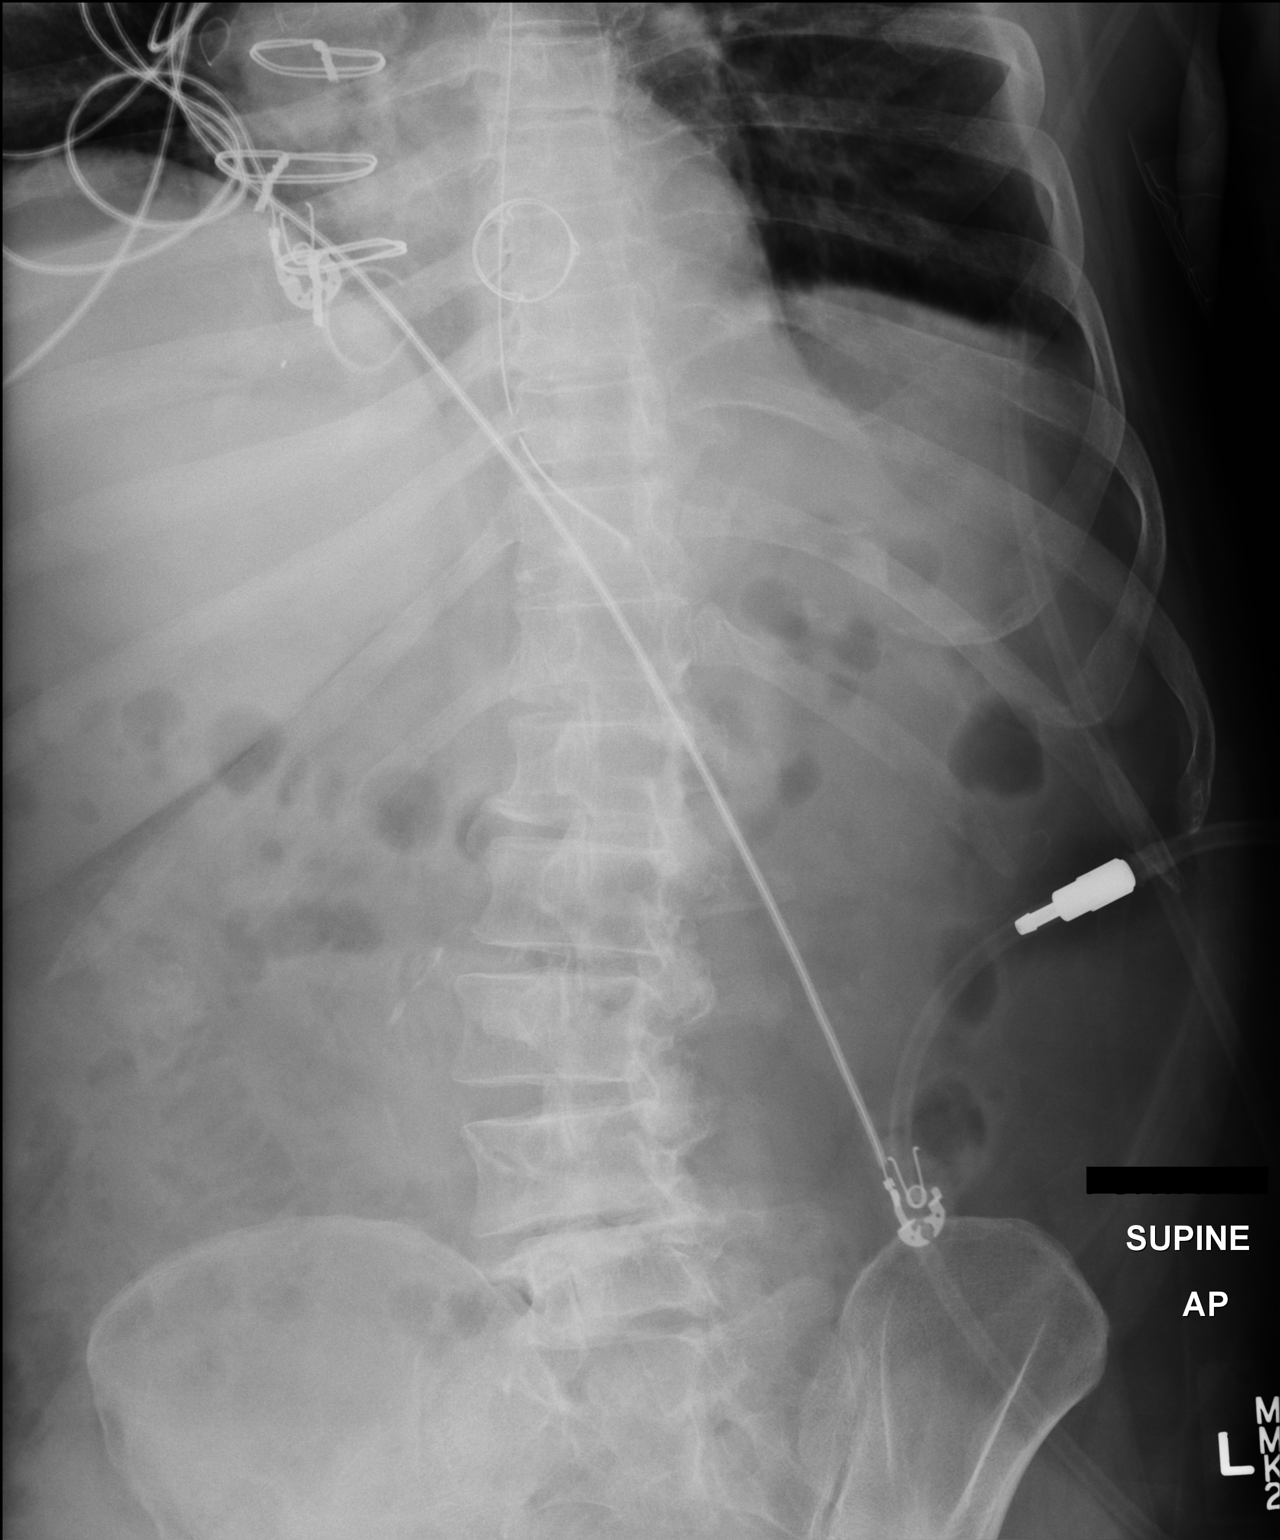

[1 of 1 positions shown; findings below may reference images not displayed]

FINDINGS: Orogastric tube crosses the gastroesophageal junction with side hole
of the catheter at the gastroesophageal junction and tip of the tube
into the stomach by approximately 3-4 cm. Nonobstructive gas
pattern.
IMPRESSION: Orogastric tube just crosses into the stomach and should be
advanced.

## 2016-12-21 IMAGING — CR DG CHEST 1V PORT
1 series · 1 of 1 positions shown · non-contrast
Comparison: 11/06/2015

CLINICAL DATA: Verify endotracheal tube placement

EXAM:
PORTABLE CHEST 1 VIEW

[AP]
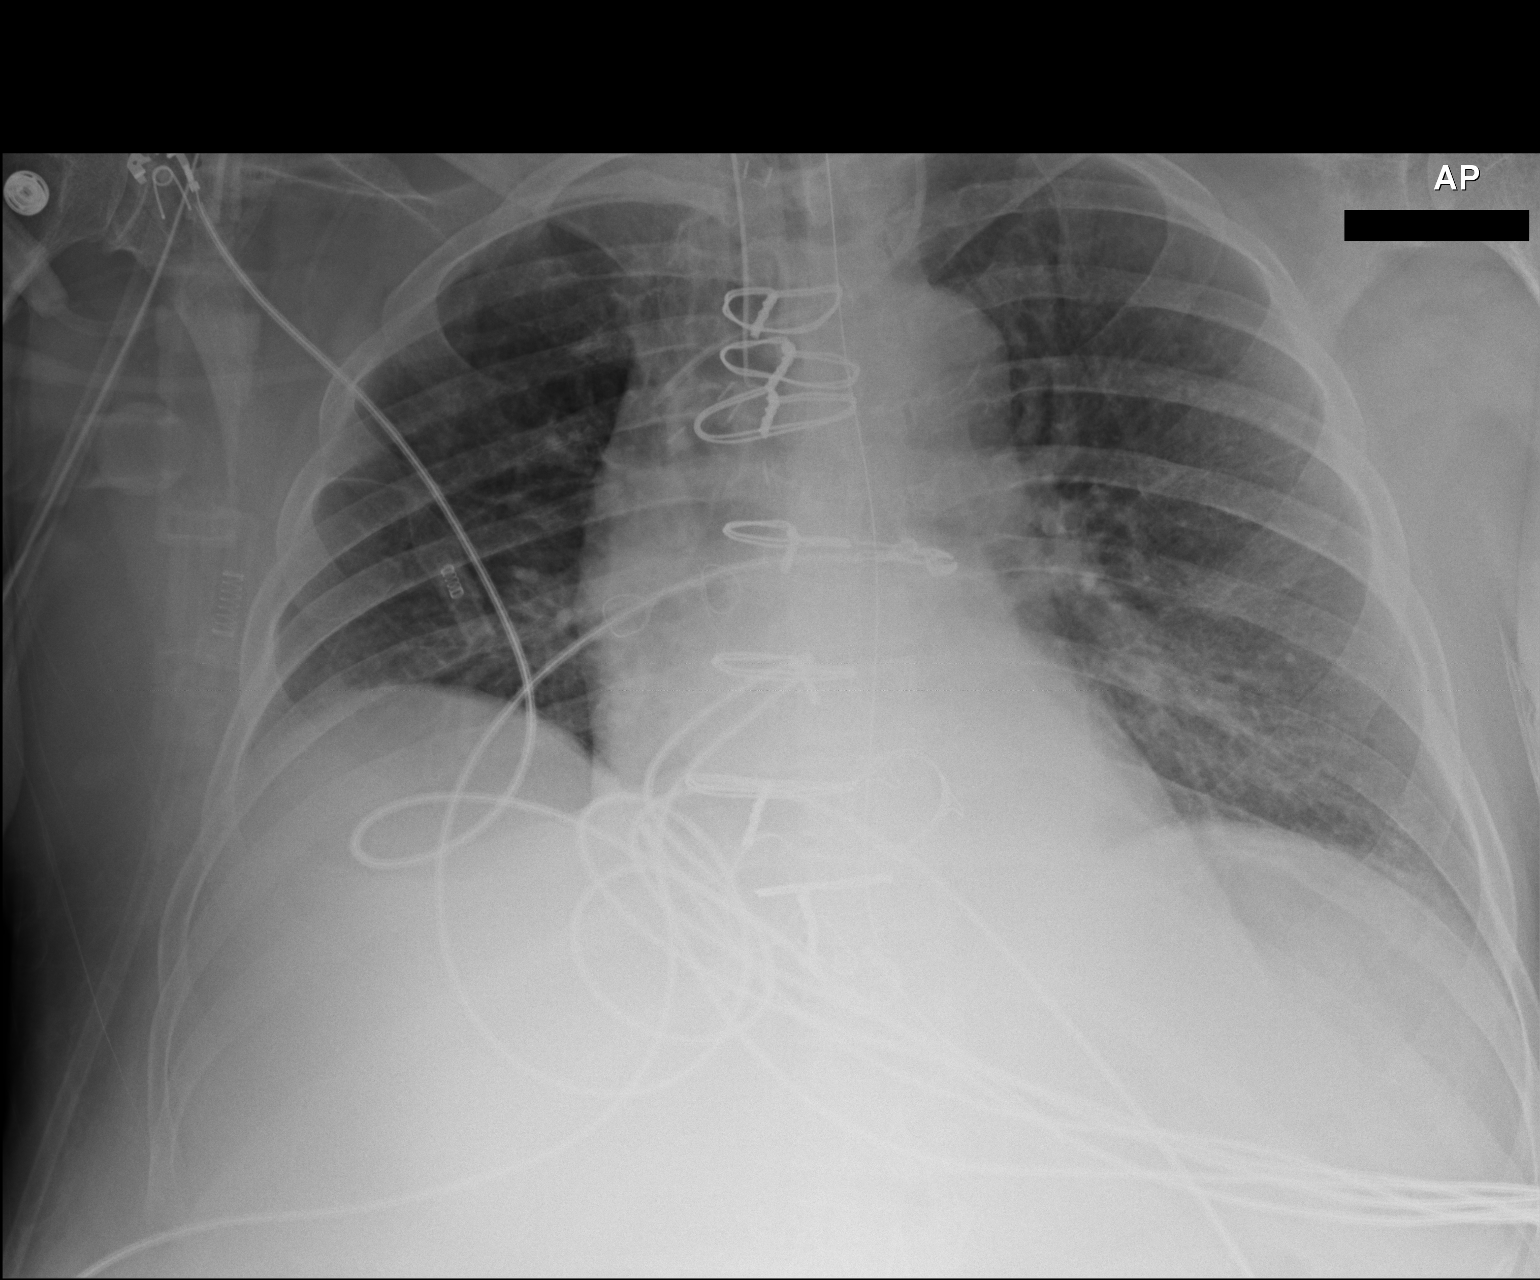

[1 of 1 positions shown; findings below may reference images not displayed]

FINDINGS: Endotracheal tube tip 2 cm above the carina. NG tube crosses the
gastroesophageal junction with side hole of the catheter just below
the diaphragm. Replacement cardiac valve noted. Left internal
jugular central line unchanged with tip over the superior vena cava.
Bilateral hypoventilatory change.
IMPRESSION: Endotracheal tube as described.
